# Patient Record
Sex: Female | Born: 1950 | Race: Black or African American | Hispanic: No | Marital: Married | Smoking: Never smoker
Health system: Southern US, Community
[De-identification: ages and names within clinical notes are randomized; demographics above are authoritative.]

## PROBLEM LIST (undated history)

## (undated) DIAGNOSIS — I5042 Chronic combined systolic (congestive) and diastolic (congestive) heart failure: Secondary | ICD-10-CM

## (undated) DIAGNOSIS — R7989 Other specified abnormal findings of blood chemistry: Secondary | ICD-10-CM

## (undated) DIAGNOSIS — I6529 Occlusion and stenosis of unspecified carotid artery: Secondary | ICD-10-CM

## (undated) DIAGNOSIS — K219 Gastro-esophageal reflux disease without esophagitis: Secondary | ICD-10-CM

## (undated) DIAGNOSIS — I255 Ischemic cardiomyopathy: Secondary | ICD-10-CM

## (undated) DIAGNOSIS — I150 Renovascular hypertension: Secondary | ICD-10-CM

## (undated) DIAGNOSIS — I340 Nonrheumatic mitral (valve) insufficiency: Secondary | ICD-10-CM

## (undated) DIAGNOSIS — I472 Ventricular tachycardia: Secondary | ICD-10-CM

## (undated) DIAGNOSIS — N183 Chronic kidney disease, stage 3 unspecified: Secondary | ICD-10-CM

## (undated) DIAGNOSIS — M1990 Unspecified osteoarthritis, unspecified site: Secondary | ICD-10-CM

## (undated) DIAGNOSIS — C50919 Malignant neoplasm of unspecified site of unspecified female breast: Secondary | ICD-10-CM

## (undated) DIAGNOSIS — E785 Hyperlipidemia, unspecified: Secondary | ICD-10-CM

## (undated) DIAGNOSIS — G629 Polyneuropathy, unspecified: Secondary | ICD-10-CM

## (undated) DIAGNOSIS — I2510 Atherosclerotic heart disease of native coronary artery without angina pectoris: Secondary | ICD-10-CM

## (undated) DIAGNOSIS — F319 Bipolar disorder, unspecified: Secondary | ICD-10-CM

## (undated) DIAGNOSIS — D649 Anemia, unspecified: Secondary | ICD-10-CM

## (undated) DIAGNOSIS — E669 Obesity, unspecified: Secondary | ICD-10-CM

## (undated) HISTORY — DX: Chronic combined systolic (congestive) and diastolic (congestive) heart failure: I50.42

## (undated) HISTORY — DX: Renovascular hypertension: I15.0

## (undated) HISTORY — DX: Atherosclerotic heart disease of native coronary artery without angina pectoris: I25.10

## (undated) HISTORY — DX: Malignant neoplasm of unspecified site of unspecified female breast: C50.919

## (undated) HISTORY — DX: Obesity, unspecified: E66.9

## (undated) HISTORY — DX: Bipolar disorder, unspecified: F31.9

## (undated) HISTORY — PX: TONSILLECTOMY: SUR1361

## (undated) HISTORY — DX: Nonrheumatic mitral (valve) insufficiency: I34.0

## (undated) HISTORY — DX: Chronic kidney disease, stage 3 (moderate): N18.3

## (undated) HISTORY — DX: Anemia, unspecified: D64.9

## (undated) HISTORY — DX: Unspecified osteoarthritis, unspecified site: M19.90

## (undated) HISTORY — DX: Polyneuropathy, unspecified: G62.9

## (undated) HISTORY — PX: CORONARY ARTERY BYPASS GRAFT: SHX141

## (undated) HISTORY — DX: Chronic kidney disease, stage 3 unspecified: N18.30

## (undated) HISTORY — DX: Hyperlipidemia, unspecified: E78.5

## (undated) HISTORY — DX: Gastro-esophageal reflux disease without esophagitis: K21.9

## (undated) HISTORY — DX: Ischemic cardiomyopathy: I25.5

---

## 1982-10-29 HISTORY — PX: ABDOMINAL HYSTERECTOMY: SHX81

## 1999-11-28 ENCOUNTER — Encounter: Admission: RE | Admit: 1999-11-28 | Discharge: 2000-02-26 | Payer: Self-pay | Admitting: Orthopedic Surgery

## 2001-07-01 ENCOUNTER — Inpatient Hospital Stay (HOSPITAL_COMMUNITY): Admission: EM | Admit: 2001-07-01 | Discharge: 2001-07-22 | Payer: Self-pay

## 2001-07-02 ENCOUNTER — Encounter: Payer: Self-pay | Admitting: Cardiovascular Disease

## 2001-07-05 ENCOUNTER — Encounter: Payer: Self-pay | Admitting: Surgery

## 2001-07-07 ENCOUNTER — Encounter: Payer: Self-pay | Admitting: Surgery

## 2001-07-08 ENCOUNTER — Encounter: Payer: Self-pay | Admitting: Surgery

## 2001-07-09 ENCOUNTER — Encounter: Payer: Self-pay | Admitting: Surgery

## 2001-07-10 ENCOUNTER — Encounter: Payer: Self-pay | Admitting: Surgery

## 2001-07-11 ENCOUNTER — Encounter: Payer: Self-pay | Admitting: Thoracic Surgery (Cardiothoracic Vascular Surgery)

## 2001-07-13 ENCOUNTER — Encounter: Payer: Self-pay | Admitting: Surgery

## 2001-07-15 ENCOUNTER — Encounter: Payer: Self-pay | Admitting: Surgery

## 2001-07-25 ENCOUNTER — Encounter: Payer: Self-pay | Admitting: Emergency Medicine

## 2001-07-25 ENCOUNTER — Inpatient Hospital Stay (HOSPITAL_COMMUNITY): Admission: EM | Admit: 2001-07-25 | Discharge: 2001-08-07 | Payer: Self-pay | Admitting: Emergency Medicine

## 2001-07-26 ENCOUNTER — Encounter: Payer: Self-pay | Admitting: Thoracic Surgery (Cardiothoracic Vascular Surgery)

## 2001-07-27 ENCOUNTER — Encounter: Payer: Self-pay | Admitting: Thoracic Surgery (Cardiothoracic Vascular Surgery)

## 2001-07-29 ENCOUNTER — Encounter: Payer: Self-pay | Admitting: Thoracic Surgery (Cardiothoracic Vascular Surgery)

## 2001-08-01 ENCOUNTER — Encounter: Payer: Self-pay | Admitting: Internal Medicine

## 2001-08-19 ENCOUNTER — Encounter: Admission: RE | Admit: 2001-08-19 | Discharge: 2001-08-19 | Payer: Self-pay | Admitting: Surgery

## 2001-08-19 ENCOUNTER — Encounter: Payer: Self-pay | Admitting: Surgery

## 2001-10-21 ENCOUNTER — Encounter (HOSPITAL_COMMUNITY): Admission: RE | Admit: 2001-10-21 | Discharge: 2001-11-28 | Payer: Self-pay | Admitting: Rehabilitation

## 2001-10-29 HISTORY — PX: SP PTA RENAL / VISC  ARTERY: HXRAD403

## 2001-12-31 ENCOUNTER — Ambulatory Visit (HOSPITAL_COMMUNITY): Admission: RE | Admit: 2001-12-31 | Discharge: 2002-01-01 | Payer: Self-pay | Admitting: Cardiology

## 2002-06-22 ENCOUNTER — Emergency Department (HOSPITAL_COMMUNITY): Admission: EM | Admit: 2002-06-22 | Discharge: 2002-06-23 | Payer: Self-pay | Admitting: Emergency Medicine

## 2002-08-12 ENCOUNTER — Ambulatory Visit (HOSPITAL_COMMUNITY): Admission: RE | Admit: 2002-08-12 | Discharge: 2002-08-13 | Payer: Self-pay | Admitting: Cardiology

## 2002-08-12 ENCOUNTER — Encounter: Payer: Self-pay | Admitting: Cardiology

## 2002-08-17 ENCOUNTER — Encounter: Admission: RE | Admit: 2002-08-17 | Discharge: 2002-11-15 | Payer: Self-pay | Admitting: Cardiology

## 2002-09-09 ENCOUNTER — Ambulatory Visit (HOSPITAL_COMMUNITY): Admission: RE | Admit: 2002-09-09 | Discharge: 2002-09-10 | Payer: Self-pay | Admitting: Cardiology

## 2003-12-27 ENCOUNTER — Ambulatory Visit (HOSPITAL_COMMUNITY): Admission: RE | Admit: 2003-12-27 | Discharge: 2003-12-27 | Payer: Self-pay | Admitting: Cardiology

## 2005-04-27 ENCOUNTER — Inpatient Hospital Stay (HOSPITAL_COMMUNITY): Admission: EM | Admit: 2005-04-27 | Discharge: 2005-04-30 | Payer: Self-pay | Admitting: Emergency Medicine

## 2006-01-09 ENCOUNTER — Ambulatory Visit: Admission: RE | Admit: 2006-01-09 | Discharge: 2006-01-09 | Payer: Self-pay | Admitting: Family Medicine

## 2006-02-12 ENCOUNTER — Encounter: Payer: Self-pay | Admitting: Vascular Surgery

## 2006-02-12 ENCOUNTER — Ambulatory Visit (HOSPITAL_COMMUNITY): Admission: RE | Admit: 2006-02-12 | Discharge: 2006-02-12 | Payer: Self-pay | Admitting: Cardiology

## 2007-06-23 ENCOUNTER — Ambulatory Visit: Payer: Self-pay | Admitting: Surgery

## 2009-03-07 ENCOUNTER — Encounter: Admission: RE | Admit: 2009-03-07 | Discharge: 2009-03-07 | Payer: Self-pay | Admitting: Cardiology

## 2009-03-16 ENCOUNTER — Ambulatory Visit: Payer: Self-pay | Admitting: Vascular Surgery

## 2009-03-23 ENCOUNTER — Encounter: Admission: RE | Admit: 2009-03-23 | Discharge: 2009-03-23 | Payer: Self-pay | Admitting: Cardiology

## 2009-03-25 ENCOUNTER — Inpatient Hospital Stay (HOSPITAL_BASED_OUTPATIENT_CLINIC_OR_DEPARTMENT_OTHER): Admission: RE | Admit: 2009-03-25 | Discharge: 2009-03-25 | Payer: Self-pay | Admitting: Cardiology

## 2009-03-25 HISTORY — PX: CARDIAC CATHETERIZATION: SHX172

## 2009-04-14 ENCOUNTER — Observation Stay (HOSPITAL_COMMUNITY): Admission: RE | Admit: 2009-04-14 | Discharge: 2009-04-15 | Payer: Self-pay | Admitting: Cardiology

## 2009-05-05 ENCOUNTER — Encounter (HOSPITAL_COMMUNITY): Admission: RE | Admit: 2009-05-05 | Discharge: 2009-06-29 | Payer: Self-pay | Admitting: Cardiology

## 2009-07-01 ENCOUNTER — Ambulatory Visit: Payer: Self-pay | Admitting: Vascular Surgery

## 2009-08-27 ENCOUNTER — Emergency Department (HOSPITAL_COMMUNITY): Admission: EM | Admit: 2009-08-27 | Discharge: 2009-08-27 | Payer: Self-pay | Admitting: Emergency Medicine

## 2010-06-09 ENCOUNTER — Ambulatory Visit: Payer: Self-pay | Admitting: Vascular Surgery

## 2010-11-19 ENCOUNTER — Encounter: Payer: Self-pay | Admitting: Family Medicine

## 2011-01-21 ENCOUNTER — Emergency Department (HOSPITAL_COMMUNITY): Payer: Medicare Other

## 2011-01-21 ENCOUNTER — Emergency Department (HOSPITAL_COMMUNITY)
Admission: EM | Admit: 2011-01-21 | Discharge: 2011-01-21 | Disposition: A | Discharge status: Home / Self Care | Payer: Medicare Other | Attending: Emergency Medicine | Admitting: Emergency Medicine

## 2011-01-21 DIAGNOSIS — I2510 Atherosclerotic heart disease of native coronary artery without angina pectoris: Secondary | ICD-10-CM | POA: Insufficient documentation

## 2011-01-21 DIAGNOSIS — R5381 Other malaise: Secondary | ICD-10-CM | POA: Insufficient documentation

## 2011-01-21 DIAGNOSIS — I10 Essential (primary) hypertension: Secondary | ICD-10-CM | POA: Insufficient documentation

## 2011-01-21 DIAGNOSIS — R059 Cough, unspecified: Secondary | ICD-10-CM | POA: Insufficient documentation

## 2011-01-21 DIAGNOSIS — Z7982 Long term (current) use of aspirin: Secondary | ICD-10-CM | POA: Insufficient documentation

## 2011-01-21 DIAGNOSIS — R0602 Shortness of breath: Secondary | ICD-10-CM | POA: Insufficient documentation

## 2011-01-21 DIAGNOSIS — E7800 Pure hypercholesterolemia, unspecified: Secondary | ICD-10-CM | POA: Insufficient documentation

## 2011-01-21 DIAGNOSIS — R05 Cough: Secondary | ICD-10-CM | POA: Insufficient documentation

## 2011-01-21 DIAGNOSIS — Z79899 Other long term (current) drug therapy: Secondary | ICD-10-CM | POA: Insufficient documentation

## 2011-01-21 DIAGNOSIS — Z951 Presence of aortocoronary bypass graft: Secondary | ICD-10-CM | POA: Insufficient documentation

## 2011-01-21 DIAGNOSIS — E119 Type 2 diabetes mellitus without complications: Secondary | ICD-10-CM | POA: Insufficient documentation

## 2011-01-21 DIAGNOSIS — Z9861 Coronary angioplasty status: Secondary | ICD-10-CM | POA: Insufficient documentation

## 2011-01-21 LAB — URINALYSIS, ROUTINE W REFLEX MICROSCOPIC
Ketones, ur: NEGATIVE mg/dL
Nitrite: NEGATIVE
Specific Gravity, Urine: 1.008 (ref 1.005–1.030)
pH: 6 (ref 5.0–8.0)

## 2011-01-21 LAB — COMPREHENSIVE METABOLIC PANEL
BUN: 13 mg/dL (ref 6–23)
CO2: 29 mEq/L (ref 19–32)
Calcium: 8.7 mg/dL (ref 8.4–10.5)
Creatinine, Ser: 1.01 mg/dL (ref 0.4–1.2)
GFR calc non Af Amer: 56 mL/min — ABNORMAL LOW (ref >60)
Glucose, Bld: 207 mg/dL — ABNORMAL HIGH (ref 70–99)

## 2011-01-21 LAB — DIFFERENTIAL
Basophils Absolute: 0 10*3/uL (ref 0.0–0.1)
Basophils Relative: 1 % (ref 0–1)
Eosinophils Relative: 4 % (ref 0–5)
Monocytes Absolute: 0.3 10*3/uL (ref 0.1–1.0)
Neutro Abs: 1.7 10*3/uL (ref 1.7–7.7)

## 2011-01-21 LAB — CBC
MCHC: 33.8 g/dL (ref 30.0–36.0)
RDW: 13.4 % (ref 11.5–15.5)
WBC: 3.7 10*3/uL — ABNORMAL LOW (ref 4.0–10.5)

## 2011-01-21 LAB — POCT CARDIAC MARKERS: Myoglobin, poc: 103 ng/mL (ref 12–200)

## 2011-02-01 LAB — URINALYSIS, ROUTINE W REFLEX MICROSCOPIC
Hgb urine dipstick: NEGATIVE
Protein, ur: NEGATIVE mg/dL
Urobilinogen, UA: 0.2 mg/dL (ref 0.0–1.0)

## 2011-02-01 LAB — GLUCOSE, CAPILLARY: Glucose-Capillary: 151 mg/dL — ABNORMAL HIGH (ref 70–99)

## 2011-02-01 LAB — DIFFERENTIAL
Basophils Absolute: 0 10*3/uL (ref 0.0–0.1)
Basophils Relative: 0 % (ref 0–1)
Eosinophils Absolute: 0 10*3/uL (ref 0.0–0.7)
Eosinophils Relative: 0 % (ref 0–5)
Lymphocytes Relative: 10 % — ABNORMAL LOW (ref 12–46)

## 2011-02-01 LAB — BASIC METABOLIC PANEL
BUN: 20 mg/dL (ref 6–23)
GFR calc non Af Amer: >60 mL/min (ref >60)
Glucose, Bld: 217 mg/dL — ABNORMAL HIGH (ref 70–99)
Potassium: 4.1 mEq/L (ref 3.5–5.1)

## 2011-02-01 LAB — CBC
HCT: 37.1 % (ref 36.0–46.0)
MCHC: 34.2 g/dL (ref 30.0–36.0)
MCV: 87.7 fL (ref 78.0–100.0)
Platelets: 186 10*3/uL (ref 150–400)
RDW: 14.1 % (ref 11.5–15.5)

## 2011-02-01 LAB — URINE MICROSCOPIC-ADD ON

## 2011-02-05 LAB — GLUCOSE, CAPILLARY
Glucose-Capillary: 104 mg/dL — ABNORMAL HIGH (ref 70–99)
Glucose-Capillary: 107 mg/dL — ABNORMAL HIGH (ref 70–99)

## 2011-02-05 LAB — BASIC METABOLIC PANEL
Chloride: 107 mEq/L (ref 96–112)
GFR calc Af Amer: >60 mL/min (ref >60)
Potassium: 3.9 mEq/L (ref 3.5–5.1)

## 2011-02-05 LAB — CBC
HCT: 33.6 % — ABNORMAL LOW (ref 36.0–46.0)
MCV: 87 fL (ref 78.0–100.0)
RBC: 3.87 MIL/uL (ref 3.87–5.11)
WBC: 4.7 10*3/uL (ref 4.0–10.5)

## 2011-03-13 NOTE — Procedures (Signed)
CAROTID DUPLEX EXAM   INDICATION:  Syncope.   HISTORY:  Diabetes:  Yes  Cardiac:  Arrhythmias, coronary artery bypass graft, history of  congestive heart failure  Hypertension:  Yes  Smoking:  No  Previous Surgery:  No  CV History:  The patient passed out last week when rising from a prone  position.  The syncopal episode was accompanied by left arm numbness.  Amaurosis Fugax No, Paresthesias Yes, Hemiparesis No                                       RIGHT             LEFT  Brachial systolic pressure:         136               138  Brachial Doppler waveforms:         Triphasic         Triphasic  Vertebral direction of flow:        Antegrade         Antegrade  DUPLEX VELOCITIES (cm/sec)  CCA peak systolic                   56                138  ECA peak systolic                   55                182  ICA peak systolic                   73                145  ICA end diastolic                   32                58  PLAQUE MORPHOLOGY:                  Mixed irregular   Mixed  PLAQUE AMOUNT:                      Mild              Moderate  PLAQUE LOCATION:                    CCA, proximal ICA.                  Proximal ICA, ECA   IMPRESSION:  1. 20-39% right internal carotid artery stenosis.  2. 40-59% left internal carotid artery stenosis.  3. Left external carotid artery stenosis.   ___________________________________________  Penny Hora Darrick Penna, Penny James   Penny James  D:  06/23/2007  T:  06/24/2007  Job:  [id number]

## 2011-03-13 NOTE — Procedures (Signed)
CAROTID DUPLEX EXAM   INDICATION:  Follow up carotid artery disease.   HISTORY:  Diabetes:  Yes.  Cardiac:  CABG, CHF, murmur.  Hypertension:  Yes.  Smoking:  No.  Previous Surgery:  No.  CV History:  No.  Amaurosis Fugax No, Paresthesias No, Hemiparesis No.                                       RIGHT               LEFT  Brachial systolic pressure:         136                 142  Brachial Doppler waveforms:         WNL                 WNL  Vertebral direction of flow:        Antegrade           Antegrade  DUPLEX VELOCITIES (cm/sec)  CCA peak systolic                   94                  186  ECA peak systolic                   428                 206  ICA peak systolic                   290                 243  ICA end diastolic                   89                  94  PLAQUE MORPHOLOGY:                  Heterogenous/calcific                  Heterogenous/calcific  PLAQUE AMOUNT:                      Moderate-to-severe  Moderate-to-  severe  PLAQUE LOCATION:                    CCA, ICA, ECA       CCA, ICA, ECA   IMPRESSION:  1. Bilateral internal carotid arteries suggest 60% to 79% stenosis.  2. Bilateral external carotid artery stenosis.  3. Antegrade flow in bilateral vertebrals.   ___________________________________________  Larina Earthly, M.D.   CB/MEDQ  D:  06/09/2010  T:  06/09/2010  Job:  [id number]

## 2011-03-13 NOTE — Assessment & Plan Note (Signed)
OFFICE VISIT   SAMERIA, MORSS  DOB:  [birth year]                                       07/01/2009  [id number]#:[id number]   The patient presents today for continued discussion regarding her severe  asymptomatic right internal carotid artery stenosis.  She had been  considered for a right carotid endarterectomy with severe right internal  carotid artery stenosis.  She subsequently had a coronary event and  underwent PTCA of her left circumflex on 04/14/2009.  I discussed this  with Dr. Mayford Knife and she feels that she is relatively unstable from  cardiac standpoint for carotid surgery.  She is seen today for further  discussion of this and also for consideration regarding stenting of her  carotid.  The patient continues to be asymptomatic.  Her physical  examination is unchanged.  She has 2+ radial pulses.   I reviewed her CT angiogram.  This shows complete circumferential  calcific plaque in her internal carotid artery beginning in her common  carotid artery.  I discussed this with the patient and her husband  present.  I explained that with calcified plaque it puts her at marked  increased risk for stroke with stenting and therefore would not  recommend stenting with this degree of calcified lesion.  Due to her  relatively unstable cardiac situation I feel that her risk for surgery  is in all likelihood greater than her risk for stroke related to her  asymptomatic disease.  I have recommended that we see her in 6 months  for repeat carotid duplex.  If at that time she has remained stable from  a cardiac standpoint we would discuss this with Dr. Mayford Knife to determine  if she may be adequate for general anesthesia and carotid  endarterectomy.  She will notify me should she have symptoms related to  her carotid disease.  I explained that with symptoms she would be at  markedly increased risk and we would recommend surgery at that time.  We  will see her in 6  months.   Larina Earthly, M.D.  Electronically Signed   TFE/MEDQ  D:  07/01/2009  T:  07/02/2009  Job:  [id number]   cc:   Armanda Magic, M.D.  Duncan Dull, M.D.

## 2011-03-13 NOTE — Assessment & Plan Note (Signed)
OFFICE VISIT   Penny, James  DOB:  [birth year]                                       06/09/2010  [id number]#:[id number]   The patient presents today for ongoing followup of asymptomatic carotid  disease.  She is status post prior coronary artery bypass grafting and  reports a stable cardiac status.  She denies any neurologic deficits,  specifically any amaurosis fugax, transient ischemic attack or stroke.  She does have a long history of non-insulin-dependent diabetes,  hypertension, elevated cholesterol and also a history of congestive  heart failure.   SOCIAL HISTORY:  She is married with 3 children.  She is retired.  She  does not smoke or drink alcohol.   FAMILY HISTORY:  Significant for premature atherosclerotic disease in  her brothers and sisters.   REVIEW OF SYSTEMS:  CONSTITUTIONAL:  Weight is 184 pounds.  She is 5  feet 4 inches tall.  She has no weight loss or gain.  She does have pain  in her legs with walking.  CARDIAC:  Positive for arrhythmia.  GI:  Negative.  NEUROLOGIC:  Positive for dizziness but no focal deficits.  PULMONARY:  Negative.  HEMATOLOGIC:  Positive for anemia.  URINARY:  Negative.  ENT:  Negative.  MUSCULOSKELETAL:  Arthritis and joint pain.  PSYCHIATRIC:  Negative.  SKIN:  Negative.   PHYSICAL EXAMINATION:  Well-developed, well-nourished black female  appearing stated age, in no acute distress.  Blood pressure is 150/75,  pulse 72, respirations 18.  HEENT is normal.  She does have bilateral  carotid bruits.  Her chest is clear bilaterally.  Heart is regular rate  and rhythm.  Abdomen:  Moderately obese and nontender.  Musculoskeletal  shows no major deformity or cyanosis.  Neurologic:  No focal stenosis or  paresthesias.  Skin without ulcers or rashes.   She did undergo repeat carotid duplex evaluation in our office today.  This reveals moderate to severe stenoses bilaterally in the 60% to 79%  stenosis range in  the upper range of this scale.  I discussed this with  the patient, explaining that she is just below the threshold of where we  would recommend endarterectomy for asymptomatic disease.  I have  explained symptoms to her and she will notify us if any of these should  occur, otherwise we will see her again in 6 months for repeat carotid  duplex to rule out progression to a severe level of stenosis.     Larina Earthly, M.D.  Electronically Signed   TFE/MEDQ  D:  06/09/2010  T:  06/12/2010  Job:  [id number]

## 2011-03-13 NOTE — Cardiovascular Report (Signed)
NAME:  Penny James, SCHREFFLER NO.:  [id number]   MEDICAL RECORD NO.:  [id number]          PATIENT TYPE:  OIB   LOCATION:  1966                         FACILITY:  [facility]   PHYSICIAN:  Armanda Magic, M.D.     DATE OF BIRTH:  [birth year]   DATE OF PROCEDURE:  03/25/2009  DATE OF DISCHARGE:  03/25/2009                            CARDIAC CATHETERIZATION   REFERRING PHYSICIAN:  Duncan Dull, MD   PROCEDURES:  1. Left heart catheterization.  2. Coronary angiography.  3. Left ventriculography.  4. Saphenous vein graft angiography.  5. Left internal mammary artery angiography.   INDICATIONS:  Coronary artery disease.  Preoperative clearance for  undergoing carotid endarterectomy with abnormal myocardial perfusion  scintigraphy.   OPERATOR:  Armanda Magic, MD   COMPLICATIONS:  None.   INTRAVENOUS MEDICATIONS:  Versed 1 mg and 50 mcg fentanyl.   COMPLICATIONS:  None.   INTRAVENOUS ACCESS:  Via attempted right femoral artery, but could not  gain access, left femoral artery then cannulated with 4-French sheath.   This is a very pleasant 60 year old African American female who has a  history of coronary artery disease and coronary artery bypass grafting  in 2002, with a LIMA to the LAD, saphenous vein graft to the diagonal,  saphenous vein graft to the intermediate and sequential saphenous vein  graft to the OM and PDA of the left circumflex, and a known nondominant  occluded right coronary artery who presents for cardiac catheterization  due to a abnormal myocardial perfusion study, which revealed moderate  ischemia in the apical lateral region and mild-to-moderate ischemia in  the basal inferior, mid inferior, and apical inferior regions.  EF was  mildly reduced to 48%.  She now presents for cardiac catheterization.   The patient was brought to the cardiac catheterization laboratory in a  fasting nonsedated state.  Informed consent was obtained.  The patient  was  connected to continuous heart rate, pulse oximetry monitoring, and  blood pressure monitor.  The right groin was prepped and draped in a  sterile fashion.  Xylocaine 1% was used for local anesthesia.  Using  modified Seldinger technique, a 4-French sheath was attempted being  placed in the right femoral artery.  It could not be advanced the entire  way.  The sheath was removed over guide wire and the dilator was placed  over the guide wire to dilate the area.  The dilator was then removed  and the sheath was then attempted to be placed back over the wire again,  it could only be advanced halfway.  IV contrast was injected.  Sheath  showing an aneurysmal segment with significant calcification.  The  catheter and the wire and sheath were all removed and manual compression  was performed, so adequate hemostasis was obtained.  The left groin was  then prepped and draped.  The groin was anesthetized with 1% lidocaine.  Using modified Seldinger technique, a 4-French sheath was placed in the  left femoral artery.  Under fluoroscopic guidance, a 4-French JL-4  catheter was placed in the left coronary artery.  Multiple cine films  were taken at  30-degree RAO and 40-degree LAO views.  This catheter was  exchanged out over a guidewire for a 4-French 3-D RCA catheter, which  attempted to engaged the right coronary artery, but was unsuccessful.  The catheter was exchanged out over a guidewire for a 4-French JR-4  catheter, which again was not successful in engaging the right coronary  artery.  The catheter was then moved into the vein graft to the  intermediate and multiple cine films were taken at 30-degree RAO 40-  degree LAO views.  This catheter was then advanced into the saphenous  vein graft to the diagonal.  Multiple cine films were taken at 30-degree  RAO and 40-degree LAO views.  This catheter was then advanced into the  vein graft to the saphenous vein graft to the PDA/OM and cine films were   taken in the LAO view showing an occluded vein graft to the ostium.  The  catheter was then pulled into the aortic arch and advanced into the left  subclavian artery.  The catheter was then under fluoroscopic guidance  advanced into the ostium of the left internal mammary artery.  Multiple  cine films were taken in the 40-degree LAO and 30-degree RAO views.  The  catheter was then exchanged out over a guide wire and a 3-D RCA catheter  was again placed up into the area of the right coronary artery, but  could not engage the ostium.  Given the fact that we had used a lot of  contrast over 100 mL and the patient had an known occluded RCA, I did  not pursue this further.  The catheter was exchanged out over guidewire  for a 4-French angled pigtail catheter, which was placed under  fluoroscopic guidance in left ventricular cavity.  Left ventriculography  was performed in the 30-degree RAO view using total of 20 mL of contrast  at 12 mL per second.  The catheter was then pulled back across the  aortic valve with no significant gradient noted.  At the end procedure,  all catheters and sheaths were removed.  Manual compression was  performed, so adequate hemostasis was obtained.  The patient was  transferred back to room in stable condition.   RESULTS:  Left main coronary artery is widely patent and bifurcates to  left anterior descending artery and left circumflex artery.   The left anterior descending artery.  The left circumflex artery has an  80% mid stenosis before the takeoff of an obtuse marginal and then  terminates in a posterior descending artery.   There is evidence of left-to-left collaterals feeding the distal and mid  intermediate, it is occluded at its ostium.   The left anterior descending artery is patent proximally and gives rise  to a small diagonal branch, then there is evidence distally of  competitive flow into a LIMA graft.  There is an 80% mid stenosis in the  LAD and  evidence of competitive flow into a LIMA graft.   The saphenous vein graft to the diagonal is widely patent.  There is  disease in the diagonal branch.  Once it bifurcates into an inferior and  superior branch, the inferior branch has a 70-80% narrowing.  The LIMA  to the LAD is widely patent.  The vessel distal to the insertion of LIMA  to the LAD is diffusely diseased and very small.  The saphenous vein  graft to the intermediate is occluded at the ostium.  Saphenous vein  graft to the OM  and PDA has a 70% ostial lesion and sequential 80% mid  body graft lesions.  The RCA is occluded and is nondominant.   Left ventriculography shows inferior basal akinesis, EF 50-55%, LV  pressure 196/23 mmHg, aortic pressure 194/81 mmHg, and LVEDP 31 mmHg.   ASSESSMENT:  1. Severe 3-vessel coronary artery disease.  2. Occluded saphenous vein graft to the obtuse marginal/posterior      descending artery.  3. Also high-grade stenosis and saphenous vein graft to the      intermediate.  Patent left internal mammary artery to the left      anterior descending, patent saphenous vein graft to the diagonal      with distal disease in the inferior branch of the diagonal.  4. Inferior basal akinesis, Ejection fraction 50-55%.  5. Elevated left ventricular end-diastolic pressure consistent with      diastolic dysfunction.   PLAN:  Discharge to home.  We will review films with Dr. Katrinka Blazing,  questionable PCI in the native left circumflex.  Plus or minus saphenous  vein graft to the intermediate.      Armanda Magic, M.D.  Electronically Signed     TT/MEDQ  D:  03/25/2009  T:  03/25/2009  Job:  [id number]

## 2011-03-13 NOTE — H&P (Signed)
HISTORY AND PHYSICAL EXAMINATION   Mar 16, 2009   Re:  Penny James, Penny James                 DOB:  [birth year]   PRIMARY DIAGNOSIS FOR ADMISSION:  Severe asymptomatic right internal  carotid artery stenosis.   HISTORY OF PRESENT ILLNESS:  The patient is a 60 year old black female  who was found to have carotid stenosis by duplex after evaluating a  right carotid bruit.  She specifically denies any prior neurologic  deficits with no stroke, amaurosis fugax or transient ischemic attacks.  She had had serial followup and recently had repeat CT scan for  continued evaluation.  I have this for review.  This revealed greater  than 80 and possibly greater than 90% right internal carotid artery  stenosis and 60-70% stenosis in the left internal carotid artery.  The  patient is right-handed.  She is seen for further evaluation.  She does  have a prior strong history of coronary artery disease undergoing prior  coronary artery bypass grafting plus has a history of hypertensive heart  disease without heart failure, elevated cholesterol.  She had had a  cardiac catheterization in 2005 showing widely patent grafts from her  prior bypass.  Her bypass was in 2002 by Dr. Laneta Simmers.  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   SOCIAL HISTORY:  She does not smoke or drink alcohol.  She is married.   MEDICATION ALLERGIES:  Penicillin and protamine.   FAMILY HISTORY:  Negative for premature atherosclerotic disease.   CURRENT MEDICATIONS:  1. Risperdal 1 mg tab b.i.d.  2. Crestor 40 mg daily.  3. Niaspan 500 mg daily.  4. Aspirin 81 mg daily.  5. Multivitamin one tablet daily.  6. Ferrous sulfate one tablet daily.  7. Zetia 10 mg once daily.  8. Nasonex 50 mcg puff daily.  9. Prevacid 15 mg over-the-counter daily.  10.Coreg 25 mg daily.  11.Lasix 40 mg daily.  12.Avapro 150 mg daily.  13.Imdur 30  mg by mouth daily.  14.Plavix 75 mg daily.  15.Glucotrol XL 10 mg daily.  16.Norvasc 5 mg daily.   PHYSICAL EXAMINATION:  A well-developed, well-nourished black female  appearing her stated age of 52.  She is grossly intact neurologically.  She does have soft right carotid bruit.  I did not appreciate any left  carotid bruit.  Her radial pulses are 2+ and she has 2+ dorsalis pedis  pulses bilaterally.  Chest is clear bilaterally.  Heart is regular rate  and rhythm.   I reviewed her CT angiogram with the patient.  This does show severe  right internal carotid artery stenosis with a patent carotid above this.  She does have unusual configuration of her carotid arteries with medial  retropharyngeal position of these.  I discussed the significance of her  high-grade stenosis and the fact that this puts her at increased risk  for stroke.  I have recommended endarterectomy for reduction of stroke  risk.  I explained a 1-2% risk of stroke with surgery and also of the  potential complications to include cranial nerve injury, bleeding and  infection also at a very low rate.  She does have moderate to severe  vertebral stenoses bilaterally and this is asymptomatic and therefore we  would not recommend intervention at this time.  She understands and  wishes to proceed with surgery  on 03/29/2009 at [facility].   Larina Earthly, M.D.  Electronically Signed   TFE/MEDQ  D:  03/16/2009  T:  03/17/2009  Job:  [id number]   cc:   Armanda Magic, M.D.  Duncan Dull, M.D.

## 2011-03-13 NOTE — Discharge Summary (Signed)
NAME:  STORMY, CONNON NO.:  [id number]   MEDICAL RECORD NO.:  [id number]          PATIENT TYPE:  INP   LOCATION:  2502                         FACILITY:  [facility]   PHYSICIAN:  Francisca December, M.D.  DATE OF BIRTH:  1951/08/21   DATE OF ADMISSION:  04/14/2009  DATE OF DISCHARGE:  04/15/2009                               DISCHARGE SUMMARY   DISCHARGE DIAGNOSES:  1. Coronary artery disease, status post percutaneous coronary      intervention to the mid circumflex, residual saphenous vein graft      disease.  2. Known coronary artery disease, history of bypass graft surgery in      2005.  3. Hypertension, renovascular, status post percutaneous transluminal      angioplasty of the left renal artery.  4. Dyslipidemia.  5. Bilateral carotid bruits, will need to have a carotid      endarterectomy in the near future.  6. Diabetes mellitus with retinopathy/neuropathy.  7. Ischemic cardiomyopathy with inferior hypokinesis with ejection      fraction being normal after 2005.  8. Obesity.  9. Hypotension.  10.Bipolar.  11.Congestive heart failure history.  12.Osteoarthritis.  13.Gastroesophageal reflux disease.   SURGICAL HISTORY:  The grafts are:  LIMA to the LAD, saphenous vein  graft to the OM1/PDA, saphenous vein graft to the RI, saphenous vein  graft to the diagonal.   HOSPITAL COURSE:  Richelle Glick is a 60 year old female who  underwent cardiac catheterization on Mar 17, 2009, for abnormal nuclear  test.  She was found to have an occluded saphenous vein graft to the  OM/PDA as well as high-grade stenosis of the saphenous vein graft to the  intermediate.  She was found to have a high-grade circumflex lesion as  well.   She was brought into the hospital on April 14, 2009, and had a  percutaneous intervention using a Xience stent to the mid circumflex  lesion with good results.  The patient remained in the hospital  overnight, and lab studies show a BUN of  17, creatinine 0.97, hemoglobin  11.4, and hematocrit 33.6.  She has an additional blockage of the  saphenous vein graft to the ramus, and there was reversibility of the  apical lateral defect in the territory of the ramus.  She will need to  return for a second procedure prior to her carotid endarterectomy.   We have scheduled her to see Dr. Armanda Magic in followup on May 03, 2009, at 11 a.m.  In the following week, she will need to have  intervention by Dr. Amil Amen, and I will go ahead and set this up and let  the patient know.   In the meantime, she is to remain on a low-sodium, heart-healthy diet.  Increase activity slowly as per cardiac rehabilitation.  No lifting for  1 week.  No driving for 2 days.  Clean cath site gently with soap and  water.  No scrubbing.   DISCHARGE MEDICATIONS:  1. Enteric-coated aspirin 325 mg a day.  2. Multivitamins and iron daily.  3. Nasonex as before.  4. Plavix 75 mg a day.  5. Lasix 40 mg a day.  6. Glucotrol XL 10 mg a day.  7. Norvasc 5 mg a day.  8. Imdur 30 mg a day.  9. Crestor 40 mg a day.  10.Zetia 10 mg a day.  11.Prevacid 15 mg a day.  12.Avapro 150 mg a day.  13.Niacin 500 mg at bedtime.  14.Coreg 25 mg p.o. b.i.d.      Guy Franco, P.A.      Francisca December, M.D.  Electronically Signed    LB/MEDQ  D:  04/15/2009  T:  04/15/2009  Job:  [id number]   cc:   Armanda Magic, M.D.  Duncan Dull, M.D.

## 2011-03-13 NOTE — Procedures (Signed)
CAROTID DUPLEX EXAM   INDICATION:  Followup of known carotid artery disease.   HISTORY:  Diabetes:  Yes.  Cardiac:  Arrhythmia, coronary artery bypass graft, history of  congestive heart failure.  Hypertension:  Yes.  Smoking:  No.  Previous Surgery:  No.  CV History:  No.  Amaurosis Fugax No, Paresthesias No, Hemiparesis No.                                       RIGHT             LEFT  Brachial systolic pressure:         160               160  Brachial Doppler waveforms:         Biphasic          Biphasic  Vertebral direction of flow:        Antegrade         Antegrade  DUPLEX VELOCITIES (cm/sec)  CCA peak systolic                   74                153  ECA peak systolic                   394               238  ICA peak systolic                   238               231  ICA end diastolic                   101               92  PLAQUE MORPHOLOGY:                  Calcified         Calcified  PLAQUE AMOUNT:                      Moderate-to-severe                  Moderate-to-severe  PLAQUE LOCATION:                    CCA, ICA, ECA     CCA, ICA, ECA   IMPRESSION:  Bilateral high-end 60-79% internal carotid artery stenosis.   ___________________________________________  Larina Earthly, M.D.   AC/MEDQ  D:  03/16/2009  T:  03/16/2009  Job:  [id number]

## 2011-03-16 NOTE — Discharge Summary (Signed)
[facility]  Patient:    [name], [name] Visit Number: [id number] MRN: [id number]          Service Type: MED Location: 2000 2005 01 Attending Physician:  Cleatrice Burke Dictated by:   Adair Patter, P.A. Admit Date:  07/01/2001 Disc. Date: 07/18/01   CC:         Alleen Borne, M.D.  Madaline Savage, M.D.   Discharge Summary  ADDENDUM  Penny James was originally scheduled for discharge on July 14, 2001. Her discharge was subsequently held secondary to fluid overload and patient having trouble with dyspnea and ambulation.  Because of her fluid overload, dyspnea, and difficulty with ambulation her discharge was held and she was aggressively diuresed with diuretics.  She was seen by cardiology while still in hospital and several of her medications were changed.  She diuresed well. Her ambulation improved and she was subsequently discharged home in stable condition on July 18, 2001.  NEW MEDICATIONS: 1. Keflex 500 mg one tablet q.8h. for eight days. 2. Digoxin 0.125 mg one tablet q.d. 3. Coreg 6.25 mg one tablet b.i.d. 4. Lasix 80 mg q.d.  Patient was originally scheduled to take Toprol XL 25 mg q.d.  This medication was discontinued. Dictated by:   Adair Patter, P.A. Attending Physician:  Cleatrice Burke DD:  07/17/01 TD:  07/17/01 Job: [id number]

## 2011-03-16 NOTE — Op Note (Signed)
[facility]  Patient:    Penny James, Penny James Visit Number: [id number] MRN: [id number]          Service Type: MED Location: 2300 2308 [id number] Attending Physician:  Cleatrice Burke Dictated by:   Alleen Borne, M.D. Proc. Date: 07/07/01 Admit Date:  07/01/2001   CC:         [facility] Card[facility] Office, Dr. Elsie Lincoln  Cath Lab, [facility]   Operative Report  PREOPERATIVE DIAGNOSIS:  Severe 3-vessel coronary artery disease with unstable angina and acute pulmonary edema.  POSTOPERATIVE DIAGNOSIS:  Severe 3-vessel coronary artery disease with unstable angina and acute pulmonary edema.  OPERATIVE PROCEDURE:  Median sternotomy, extracorporeal circulation, coronary artery bypass graft surgery x 5 using a left internal mammary artery graft to left anterior descending coronary artery, with a saphenous vein graft to the diagonal branch of the left anterior descending, a saphenous vein graft to the intermediate coronary artery, and a sequential saphenous vein graft to the obtuse marginal and posterior descending branch of the left circumflex coronary artery.  ATTENDING SURGEON:  Alleen Borne, M.D.  ASSISTANT:  Dominica Severin, P.A.-C.  ANESTHESIA:  General endotracheal.  CLINICAL HISTORY:  This patient is a 60 year old diabetic woman with a history of hypertension, hyperlipidemia, and a family history of premature coronary disease, who was admitted on July 02, 2001 with acute pulmonary edema and chest pain.  She ruled out for acute myocardial infarction.  Her pulmonary edema improved with diuretic therapy.  She underwent cardiac catheterization by Dr. Delrae Rend and this showed severe three-vessel disease.  The LAD had a 90% hazy ostial lesion.  There was about 70-80% distal stenosis.  There was a diagonal branch that had a high-grade ostial lesion.  The LAD appeared diffusely diseased.  There was an intermediate vessel with a 99%  ostial stenosis.  The left circumflex was the dominant vessel and it had 90% mid vessel stenosis before the takeoff of two posterolateral branches and a posterior descending branch.  There was about an 80% stenosis in the posterior descending branch.  The right coronary artery was nondominant and occluded and was not seen filling by collaterals.  The left ventricular ejection fraction was 35% with global hypokinesis.  There was no significant mitral regurgitation.  After review of the angiograms and examination of the patient, it was felt that coronary artery bypass graft surgery was the best treatment. I discussed the operative procedure with the patient and her family, including alternatives, benefits, and risks, including bleeding, possible blood transfusion, infection, stroke, myocardial infarction, and death.  They understood and agreed to proceed.  OPERATIVE PROCEDURE:  The patient was taken to the operating room and placed on table in the supine position.  After induction of general endotracheal anesthesia, a Foley catheter was placed in bladder using sterile technique. Then, the chest, abdomen and both lower extremities were prepped and draped in usual sterile manner.  The chest was entered through a median sternotomy incision.  The pericardium opened in the midline.  Examination of the heart showed good ventricular contractility.  The ascending aorta had no palpable plaques in it.  Then, the left internal mammary artery was harvested from the chest wall as a pedicle graft.  This was a medium caliber vessel with excellent blood flow through it.  At the same time, a segment of greater saphenous vein was harvested from the right leg and this vein was of medium size and good quality.  Then, the patient was heparinized  and when an adequate activated clotting time was achieved, the distal ascending aorta was cannulated using a 20-French aortic cannula for arterial inflow.  Venous  outflow was achieved using a 2-stage venous cannula through the right atrial appendage.  An antegrade cardioplegia and vent cannula was inserted in the aortic root.  The patient was placed on cardiopulmonary bypass and distal coronaries identified.  She had severe diffuse coronary plaque.  Every artery that was visualized on her heart had plaque extending from the proximal portion the whole way to the distal portions of the vessels.  Much of this plaque was calcified.  Then the aorta was cross-clamped and 500 cc of cold-blood antegrade cardioplegia was administered in the aortic root with quick arrest of the heart.  Systemic hypothermia to 20 degrees Centigrade and topical hypothermia with iced saline was used.  A temperature probe was placed in the septum and an insulating pad in the pericardium.  The first distal anastomosis was performed to the obtuse marginal branch.  The internal diameter was about 1.75 mm.  The conduit used was a segment of greater saphenous vein and the anastomosis performed in a sequential side-to-side manner using continuous 7-0 Prolene suture.  Flow was measured through the graft and was excellent.  The second distal anastomosis was performed to the posterior descending branch of the right coronary artery.  The internal diameter of this vessel was 1.5 mm.  The conduit used was the same segment of greater saphenous vein and  the anastomosis performed in a sequential end-to-side manner using continuous 7-0 Prolene suture.  Flow was measured through the graft and was excellent.  Then a dose of cardioplegia was given down the vein graft and in the aortic root.  The third distal anastomosis was then performed to the intermediate coronary artery.  The internal diameter was 1.5 mm.  The conduit used was a second segment of greater saphenous vein and the anastomosis performed in an end-to-side manner using continuous 7-0 Prolene suture.  Flow was measured through  the graft and was excellent.   The fourth distal anastomosis was performed to the diagonal branch.  The internal diameter of was 1.5 mm.  The conduit used was a third segment of greater saphenous vein and the anastomosis performed in an end-to-side manner using continuous 7-0 Prolene suture.  Flow was measured through the graft and was excellent.  Then another dose of cardioplegia was given down the vein grafts and in the aortic root.  The fifth distal anastomosis was performed to the mid portion of the left anterior descending coronary artery.  The internal diameter was 1.75 mm.  The conduit used was the left internal mammary artery and this was brought through an opening in the left pericardium anterior to the phrenic nerve.  It was anastomosed to the LAD in an end-to-side manner using continuous 8-0 Prolene suture.  The pedicle was tacked to epicardium with 6-0 Prolene sutures.  The patient was rewarmed to 37 degrees Centigrade and the clamp removed from the mammary pedicle.  There was rapid warming of ventricular septum and return of spontaneous ventricular fibrillation.  The cross-clamp was removed with a time of 89 minutes and the patient defibrillated in a sinus rhythm.  A partial occlusion clamp was placed on the aortic root and the three proximal vein graft anastomoses were performed in an end-to-side manner using continuous 6-0 Prolene suture.  The clamp was removed and the vein grafts deaired and the clamps removed from them.  The proximal  and distal anastomoses appeared hemostatic and the lying of the grafts satisfactory.  Graft markers were placed around the proximal anastomoses.  Two temporary right ventricular and right atrial pacing wires were placed and brought out through the skin.  When the patient had rewarmed to 37 degrees Centigrade, she was weaned from cardiopulmonary bypass on low-dose dopamine.  Total bypass time was 140 minutes.  Cardiac function appeared  excellent with a cardiac output of 5 L/min.  Then a test dose of protamine was given.  As this test dose of 1 mg was given, the heart immediately began to dilate and become globally hypocontractile with a drop in heart rate and blood pressure.  This continued to worsen and it was necessary to re-heparinize and place the venous cannula back in the right atrium and we had to go back on bypass.  After back on bypass for a couple of minutes, the heart began to beat vigorously again. After an additional 10 minute rest period, we again weaned from cardiopulmonary bypass.  At this point, I was not sure whether this event was due to protamine or due to some air in one of the coronaries or a graft problem.  The second time we weaned from cardiopulmonary bypass, the heart function was excellent.  Vital signs were very stable.  Contraction seemed very vigorous.  We gave 100 mg hydrocortisone and 50 mg of Benadryl.  Then, a 1 mg test dose of protamine was again given.  As this was started, the heart began to do the same thing as before with marked decrease in contractility and a rise in pulmonary artery pressure and CVP.  The patient became bradycardic and blood pressure dropped precipitously.  This required [place] to go back on bypass a second time.  Within a few minutes, cardiac contractility was normal again.  I examined the grafts and they were all lying normally and appeared to have good blood flow through them.  There was good contractility of all myocardial segments.  At this point, I was fairly convinced that this was a protamine reaction and did not feel comfortable with giving the patient another dose of protamine.  Therefore, a decision was made to allow the heparin to rear off on its own knowing that we may have to return to the operating room for bleeding.  The aortic and venous cannulae were removed without difficulty.  The patient was given 8 units of platelets and two units of fresh frozen  plasma to correct any underlying coagulopathy.  Hemostasis was achieved as best as possible.  There was still diffuse oozing from all the raw surfaces.  Three chest tubes were placed with a tube in the posterior pericardium, one in the left pleural space and one in the anterior mediastinum.  The pericardium was loosely reapproximated over the heart.  The sternum was closed with #6 stainless steel wires.  Fascia was closed with continuous #1 Vicryl suture.  Subcutaneous tissue was closed with a continuous 2-0 Vicryl and the skin with 3-0 Vicryl subcuticular closure.  The lower extremity vein harvest site was closed layers in a similar manner.  The sponge, needle and instrument counts were correct according to scrub nurse. Dry sterile dressings were applied over the incisions, around the chest tubes, which were hooked to Pleur-evac suction.  The patient remained hemodynamically stable and was transported to the SICU in guarded, but stable condition. Dictated by:   Alleen Borne, M.D. Attending Physician:  Cleatrice Burke DD:  07/07/01 TD:  07/07/01 Job: [id number]

## 2011-03-16 NOTE — Cardiovascular Report (Signed)
NAME:  Penny James, Penny James                     ACCOUNT NO.:  [id number]   MEDICAL RECORD NO.:  [id number]                   PATIENT TYPE:  OIB   LOCATION:  6533                                 FACILITY:  [facility]   PHYSICIAN:  Cristy Hilts. Jacinto Halim, M.D.                  DATE OF BIRTH:  1951-05-15   DATE OF PROCEDURE:  08/12/2002  DATE OF DISCHARGE:  08/13/2002                              CARDIAC CATHETERIZATION   PROCEDURES PERFORMED:  1. Left heart catheterization.  2. Left ventriculography.  3. Selective right and left coronary arteriography.  4. Saphenous vein graft and left internal mammary artery angiography.  5. Abdominal aortogram with bilateral selective renal arteriography.   INDICATION:  The patient is a 60 year old female with history of  hypertension, diabetes, hypercholesterolemia, and history of coronary artery  disease, status post coronary artery bypass grafting, presents with  increasing shortness of breath.  Also, her blood pressure has been  uncontrolled.  She has had renal angioplasty in the past.  Suspicion for re-  stenosis was present.  Hence she underwent Cardiolite stress test which  revealed lateral wall ischemia.  Hence, she was brought to the cardiac  catheterization laboratory to evaluate her coronary anatomy.   HEMODYNAMIC DATA:  The left ventricular pressures 159/70 with an end-  diastolic pressure of 19 mmHg.  The aortic pressure 159/74 with a mean of  108 mmHg.  There was no pressure gradient across the aortic valve.   ANGIOGRAPHIC DATA:   LEFT VENTRICULOGRAM:  The left ventricular systolic function was mildly  depressed at around 40-45% with mild inferior inferolateral wall  hypokinesis.  This ejection fraction is improved from 30-35% almost a year  ago.   Right coronary artery:  The right coronary artery is nondominant and is  occluded in the proximal segment.   Left main coronary artery:  The left main coronary artery is a large caliber  vessel.   It has mild calcification.   Left circumflex:  Left circumflex is very large caliber vessel and a  dominant vessel.  It gives origin to a large obtuse marginal #1, a moderate  sized obtuse marginal #2 and a large PDA.  Just before the origin of obtuse  marginal #1 there is a 70-80% stenosis.  There is also a 90% stenosis just  after the origin of the obtuse marginal #1.  The distal PDA itself has mild  diffuse disease.  The obtuse marginal #1 and the distal PDA are supplied by  SVG.   Ramus intermedius:  The ramus intermedius is occluded 100% in the proximal  segment.  The distal bed is supplied by SVG to ramus intermedius. The ramus  itself has mild to moderate diffuse disease.   Left anterior descending:  The left anterior descending is a very large  caliber vessel, wraps around the apex. It has an 80% ostial stenosis.  The  mid to distal segment has about 50-60%  stenosis.  The mid left anterior  descending is supplied by LIMA to LAD.   The LAD gives origin to a moderate sized diagonal #1, which is sincerely  diffusely diseased and the rest of the lumen is about 1.0 to 1.5 mm.  This  is supplied by SVG to diagonal #1.  However, distal to the SVG insertion,  there is about 99% stenosis.   SVG to ramus intermedius:  The SVG to ramus is widely patent.   SVG to diagonal #1:  SVG to diagonal #1 is widely patent.  However, as noted  above, distal to this insertion site of the graft, there is about 99%  stenosis.  However, the diagonal #1 itself is severely diffusely diseased,  1.0 to 1.5 mm vessel.   SVG to obtuse marginal #1 and PDA sequential graft:  SVT to OM-1 and PDA has  very smooth lumen. However, the mid segment has about 80-90% luminal  irregularity noted.  The obtuse marginal #1 is not well-visualized through  the graft.  The PDA lights up angiographically.   LIMA to LAD:  LIMA to LAD is widely patent.  The subclavian artery is also  widely patent.   ABDOMINAL AORTOGRAM:   Abdominal aortogram revealed mild atherosclerotic  changes.   Selective right and left renal arteriography:  Selective right and left  renal arteriography revealed in-stent re-stenosis by about 80% in the left  renal artery.  The right renal stent is widely patent.   IMPRESSION:  1. Patent grafts, including saphenous vein graft to ramus, saphenous vein     graft to diagonal (post insertion site has 99% stenosis) and saphenous     vein graft to obtuse marginal and posterior descending artery; however,     has 80-90% mid stenosis and patent left internal mammary artery to left     anterior descending. Severe native coronary artery disease.  2. Ejection fraction improved to 45% from prior 30-35% a year ago.  3. 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   RECOMMENDATIONS:  We will proceed with PCI of the SVG to OM-1 and PDA.   INTERVENTIONAL DATA:  Successful filter wire placement and primary stenting  of the SVG to obtuse marginal #1 and PDA graft with a 4.5 x 12 mm Express  stent deployed at 12 atmospheres maximum.  The stenosis was reduced from 80-  90% to 0%.  Post stent deployment, the obtuse marginal is now very well-  visualized along with PDA.   RECOMMENDATIONS:  Consider aggressive risk factor modification as indicated.  Will add Norvasc for hypertension control. Patient may benefit from re-do of  left renal artery in-stent re-stenosis as her blood pressure had been well-  controlled following the angioplasty. This will be performed on an elective  basis.  The TIMI flow was from TIMI-3 to TIMI-3 after successful PTCA and  stenting of SVG to obtuse marginal #1 and PDA.   TECHNIQUE OF PROCEDURE:  Under usual sterile precautions, using 6 French  right femoral artery access and a 6 [place] multipurpose B2 catheter was  advanced into the ascending aorta over a 0.035 inch J wire.  The catheter was gently advanced to the left ventricle and left ventricular pressure were  monitored.   Hand contrast injection of the left ventricle was performed,  both in LAO and RAO projection. Then the catheter was flushed with saline,  pulled back into the ascending aorta and pressure gradient across gradient  across the aortic valve was monitored. The right coronary artery  was  selectively engaged and angiography was performed.  Then the catheter was  manipulated to engage the SVG to obtuse marginal and PDA and SVT to ramus  intermediate.  Then the catheter was pulled back into the abdominal aorta  and abdominal aortogram  right and left selective renal arteriography was  performed. Then the catheter was pulled out of the body.  Then a 6 [place]  Judkins left diagnostic catheter was advanced through the ascending aorta  and the left main coronary artery was selectively engaged.  Angiography was  performed. Then the catheter was pulled out of the body.  Then a 6 [place]  diagnostic Judkins right catheter was advanced in the ascending aorta and  the SVG to the ramus intermedius was cannulated and angiography was  performed.  Then the catheter was disengaged and pulled back into the  arch  of the aorta and the left subclavian artery was selectively cannulated and  angiography was performed.  Then the catheter was pulled out of the body in  the usual fashion.   TECHNIQUE OF INTERVENTION:  The 6 French sheath was exchanged for a 7 [place]  sheath.  Then a 7 [place] hockey-stick with side-hole was advanced into the  ascending aorta over a 0.035 inch J wire.  The SVG to circumflex coronary  artery was selectively cannulated. Then a Scimed filter wire was advanced  into the graft and the filter was carefully positioned distal to the  stenosis.  Then a 4.5 x 12 mm Express stent was advanced over this guide  wire after removing the introducer sheath and after positioning confirming  the position of the stent, the stent was deployed at 12 atmospheres.  The  pressure was reduced to 10 atmospheres of  pressure for 45 seconds, balloon  was deflated.  A second inflation was performed again at 10 atmospheres of  pressure for 30 seconds and the balloon was deflated, pulled back in the  guiding catheter and intracoronary nitroglycerin was administered and  angiography was performed.  Then the filter wire was revamped into the  sheath and pulled out of the body. Excellent results were noted.  There was  no evidence of dissection, no evidence of thrombosis at the end of the  procedure. A TIMI flow was from TIMI-3 to TIMI-3 flow.  Then the guide was  disengaged from the graft and pulled out of the body in the usual fashion.  During the procedure, Angiomax was utilized for anticoagulation and the ACT  was maintained at therapeutic range.  The patient tolerated the procedure  well.                                               Cristy Hilts. Jacinto Halim, M.D.    Pilar Plate  D:  08/13/2002  T:  08/14/2002  Job:  [id number]

## 2011-03-16 NOTE — Cardiovascular Report (Signed)
[place]. [facility]  Patient:    Penny James, Penny James Visit Number: [id number] MRN: [id number]          Service Type: MED Location: [facility] [id number] Attending Physician:  Berry, Jonathan [place] Dictated by:   Delrae Rend, M.D. Proc. Date: 07/03/01 Admit Date:  07/01/2001   CC:         Madaline Savage, M.D.  [facility]   Cardiac Catheterization  ATTENDING CARDIOLOGIST:  Madaline Savage, M.D.  PROCEDURES PERFORMED: 1. Selective left ventriculography. 2. Selective left coronary arteriography. 3. Nonselective right coronary arteriography. 4. Ascending aortogram. 5. Visualization of left subclavian artery and left internal mammary    artery. 6. Abdominal aortogram with selective right and left renal arteriogram.  INDICATIONS:  Ms. Arrazola is a 60 year old female with history of hypertension and diabetes mellitus, who was admitted to the hospital with flash pulmonary edema.  She was brought to the cardiac catheterization laboratory to evaluate her coronary anatomy.  HEMODYNAMIC DATA:  Left ventricular pressures were 132/10, with end-diastolic pressure of 28 mmHg.  The central aortic pressures were 129/75 with a mean of 94 mmHg.  There was no pressure gradient across the aortic valve.  ANGIOGRAPHIC DATA: Left ventricle:  The left ventricular systolic function is globally reduced and is estimated at 30-35%.  There is global hypokinesis.  There is no significant mitral regurgitation noted.  Left main coronary artery:  The left main coronary artery has distal 20-30% stenosis.  It bifurcates into left anterior descending, intermediate, and circumflex coronary artery.  Left anterior descending artery:  The left anterior descending is a large caliber vessel.  Proximally, it has ostial 90% stenosis with haziness.  It gives origin to a moderate sized diagonal #1 and smaller diagonals #2 and #3. The diagonals are disease  free.  In the mid to distal segment of the left anterior descending there is about 70-80% stenosis.  It has mild diffuse disease and wraps around the apex.  Intermediate coronary artery:  The intermediate coronary artery is a moderate caliber vessel.  It has ostial 99% stenosis and has moderate diffuse disease in the proximal segment.  Circumflex artery:  The circumflex artery is a large caliber vessel.  It is a dominant vessel.  It has a mid 90% stenosis and a mid to distal trifurcation. There is 80% stenosis at the trifurcation of obtuse marginal #1, obtuse marginal #2, and obtuse marginal #3.  It gives origin to several small posterior descending arteries.  The circumflex system has left-to-left collaterals from the left anterior descending artery.  Right coronary artery:  The right coronary artery is not visualized directly. It is flush occluded at the aorta.  The RCA is nondominant and is filled by collaterals from the left-to-right on ascending root injection.  Ascending aortic root angiogram:  This revealed evidence of three aortic valve cusps.  There is no evidence of aortic regurgitation.  There is no evidence of aortic dissection.  The right coronary artery is visualized as a part of late filling probably from collaterals from left-to-right (type A collaterals).  Left subclavian arteriogram/left internal mammary artery:  Visualization revealed patent LIMA.  There was no subclavian stenosis.  Abdominal aortogram:  There was the presence of two renal arteries, left and right.  There was tortuous aorta.  Renal arteriogram:  The left renal artery has diffuse plaque in the proximal segment.  It has about 80% stenosis.  The right renal artery has about 20% stenosis.  IMPRESSIONS: 1. Marked global hypokinesis with ejection fraction of 30-35% with elevated    end-diastolic pressure of 28 mmHg. 2. Ostial left anterior descending artery and mid to distal left anterior     descending artery stenosis by 90% and 70-80% respectively. 3. 99% ostial intermediate coronary artery lesion with moderate diffuse    disease in the proximal segment. 4. Mid 90% and mid to distal trifurcation stenosis of the circumflex    coronary artery (dominant artery).  There were left-to-left collaterals    seen from the left anterior descending artery to the circumflex coronary    artery. 5. Renal arteriography revealed diffuse atherosclerotic change of the left    renal artery involving from the ostium all the way to the bifurcation with    80% stenosis.  The right has 20% stenosis. 6. Patent left internal mammary artery.  RECOMMENDATIONS:  We will proceed with coronary artery bypass grafting surgery.  CVTS has been notified.  TECHNIQUE:  Under the usual sterile precautions, using 6-French right femoral artery access, a 6-French Judkins left diagnostic catheter was advanced into the ascending aorta over a 0.035 mm J-wire.  The catheter was gently advanced into the left main coronary artery and the left main coronary artery was selective engaged and arteriography was performed.  After obtaining the adequate visualization, the catheter was pulled out of the body in the usual fashion.  Then a 6-French Judkins diagnostic right catheter was advanced to the descending aorta over a 0.035 mm J-wire.  Right coronary artery cannulation was attempted.  Then the catheter was gently advanced into the left ventricle and left ventricular pressures were monitored.  Hand contrast injection of the left ventricle was performed in the RAO projection.  The catheter was flushed with saline and pulled back into the ascending aorta and pressure gradient across the aortic valve was monitored.  Then the catheter was pulled out of the body in the usual fashion.  Then an Amplatz-I 6-French diagnostic catheter was advanced into the ascending aorta over a 0.035 mm  J-wire.  The right coronary artery  cannulation was again attempted and because we were unable to cannulate the right coronary artery, the catheter was then pulled out of the body over a 0.035 mm J-wire and a 6-French multipurpose B2 catheter was advanced into ascending aorta over the 0.035 mm J-wire.  Again, the right coronary artery cannulation was attempted.  Then the catheter was pulled back into the arch of the aorta and left subclavian arteriography was performed in the LAO projection.  The catheter was pulled back into the abdominal aorta and abdominal aortogram was performed.  Then selective right and left renal arteriography was then performed.  Then the catheter was pulled out of the body of the usual fashion and manual pressure was held and adequate hemostasis obtained.  The patient was transferred to the floor in stable condition. Dictated by:   Delrae Rend, M.D. Attending Physician:  Berry, Jonathan [place] DD:  07/03/01 TD:  07/03/01 Job: [id number]

## 2011-03-16 NOTE — Discharge Summary (Signed)
[place]. [facility]  Patient:    Penny James, Penny James Visit Number: [id number] MRN: [id number]          Service Type: DSU Location: [phone] Attending Physician:  Pamella Pert Dictated by:   [facility] Creola, P.A.C. Admit Date:  12/31/2001                             Discharge Summary  There was no dictation on this report. Dictated by:   [facility] Corbin, P.A.C. Attending Physician:  Pamella Pert DD:  12/31/01 TD:  01/01/02 Job: [id number]

## 2011-03-16 NOTE — Discharge Summary (Signed)
NAME:  Penny James, Penny James                     ACCOUNT NO.:  [id number]   MEDICAL RECORD NO.:  [id number]                   PATIENT TYPE:  OIB   LOCATION:  6533                                 FACILITY:  [facility]   PHYSICIAN:  Cristy Hilts. Jacinto Halim, M.D.                  DATE OF BIRTH:  [birth year]   DATE OF ADMISSION:  08/12/2002  DATE OF DISCHARGE:  08/13/2002                                 DISCHARGE SUMMARY   DISCHARGE DIAGNOSES:  1. Coronary disease, coronary artery bypass grafting in 9/02.  2. Unstable angina with abnormal Cardiolite study as an outpatient,     catheterization revealing stenosis in the saphenous vein graft to obtuse     marginal, posterior descending artery treated with stenting this     admission.  3. Left ventricular dysfunction, ejection fraction 40 to 45% by     catheterization, 25% by echocardiogram.  4. Peripheral vascular disease with bilateral renal artery stenting in 3/03,     now with an 80% left renal artery in stent restenosis.  5. Hypertension, controlled.  6. Non-insulin dependent diabetes.  7. Treated hyperlipidemia.   HOSPITAL COURSE:  The patient is a 60 year old female followed by Dr. Jacinto Halim,  Dr. Kevan Ny with a history of coronary disease.  She had bypass surgery in  9/02.  She has had left ventricular dysfunction in the past, and an  echocardiogram done on 08/04/02, showed her ejection fraction to be 25%.  Cardiolite study done on 08/05/02 for chest pain indicated inferior lateral  scar with moderate amount of inferior lateral and lateral wall ischemia.  Ejection fraction at Cardiolite was 29%.  It was elected to set her up for  same day catheterization.  This was done on 08/12/02.  This revealed a  patent LIMA to LAD, patent SVG to the first diagonal, but a 99% stenosis  after the anastomosis.  The SVG to the ramus intermedius was patent.  The  SVG to the OM and PDA had an 80% to 90% stenosis mid graft.  The native RCA  was occluded and a nondominant  vessel.  Ejection fraction was 40 to 45%.  Abdominal angiogram showed a patent right renal artery stent and an 80% left  renal artery in stent restenosis.  The patient underwent percutaneous  transluminal coronary angioplasty and stenting to the saphenous vein graft  to the obtuse marginal and posterior descending artery with an Express  stent.  Dr. Jacinto Halim achieved good final results.  The first diagonal after the  graft was a small vessel, and this will be treated medically.  Post  procedure, she has done well.  She will be discharged later on 08/13/02.  We  did adjust her medications, adding Imdur, Norvasc, and Plavix.  She may  require followup Dopplers and possibly intervention to the left renal  artery.  At discharge, her blood pressure is normal, and her creatinine is  1.0.   DISCHARGE  MEDICATIONS:  1. Aspirin 81 mg q.d.  2. Plavix 75 mg q.d.  3. Imdur 30 mg q.d.  4. Avapro 150 mg b.i.d.  5. Coreg 25 mg b.i.d.  6. Lasix 40 mg q.d.  7. Glucotrol XL 5 mg q.d.  8. Zocor 40 mg q.d.  9. Nitroglycerin sublingual p.r.n.  10.      Norvasc 5 mg q.d.   LABORATORY DATA:  White count 7.4, hemoglobin 12.0, hematocrit 35.3,  platelets 192.  Sodium 137, potassium 3.8, BUN 14, creatinine 1.0.  EKG  shows sinus rhythm, nonspecific ST-T changes with some lateral T-wave  inversion.   DISPOSITION:  The patient is discharged in stable condition, and will follow  up with Dr. Jacinto Halim as an outpatient.  She may need further intervention to  the left renal artery, this will be deferred to Dr. Jacinto Halim.     Abelino Derrick, P.A.                      Cristy Hilts. Jacinto Halim, M.D.    Lenard Lance  D:  08/13/2002  T:  08/15/2002  Job:  [id number]   cc:   Duncan Dull, M.D.  [address]  [place]  [place] [address]  Fax: [phone]

## 2011-03-16 NOTE — Cardiovascular Report (Signed)
NAME:  Penny James, Penny James                     ACCOUNT NO.:  [id number]   MEDICAL RECORD NO.:  [id number]                   PATIENT TYPE:  OIB   LOCATION:  2852                                 FACILITY:  [facility]   PHYSICIAN:  Armanda Magic, M.D.                  DATE OF BIRTH:  06/10/1951   DATE OF PROCEDURE:  12/27/2003  DATE OF DISCHARGE:                              CARDIAC CATHETERIZATION   PROCEDURE:  Left heart catheterization, coronary angiography, left  ventriculography, saphenous vein graft angiography and left internal mammary  artery angiography.   CARDIOLOGIST:  Armanda Magic, M.D.   REFERRING PHYSICIAN:  Duncan Dull, M.D.   INDICATIONS:  Chest pain and unstable angina with known coronary disease.   COMPLICATIONS:  None.   ACCESS:  IV access via right femoral artery 6 French sheath.   HISTORY:  This is a 60 year old black female with a history of hypertension,  diabetes mellitus, hyperlipidemia and a history of coronary artery disease  status post coronary artery bypass grafting who presents with increasing  chest pain.  She also has a history of renal artery stenosis in the past and  successful renal artery angioplasty.  She now presents for heart  catheterization due to increasing chest pain as well as abnormal Cardiolite  showing a partially reversible apical defect.   DESCRIPTION OF PROCEDURE:  The patient is brought to the cardiac  catheterization laboratory in the fasting, nonsedated state.  Informed  consent was obtained.  The patient was connected to continuous heart rate  and pulse oximetry and intermittent blood pressure monitoring.  The right  groin was  prepped and draped in a sterile fashion.  Then 1% Xylocaine was used for  local] anesthesia.  There was great difficulty in accessing the femoral  artery and Dr. Katrinka Blazing actually had to help in gaining access into the right  femoral artery which she did using a modified ed Seldinger technique under  fluoroscopic guidance and inserted a 6 French arterial sheath.  Under  fluoroscopic guidance, a 6 [place] JL4 catheter was placed into the left  coronary artery.  Multiple cine films were taken in a 30 degree RAO and 40  degree LAO views.  This catheter was then exchanged out for a 6 [place] JR4  catheter which was placed under fluoroscopic guidance into the area of the  right coronary artery, but the coronary ostium could not be found.  The  catheter was then pulled back into the first vein graft which was the vein  graft to the OM-PDA.  Multiple cine films were taken in 30 degree RAO and 40  degree LAO views.  The catheter was then pulled back into the vein graft to  the ramus.  Multiple cine films were taken in 30 degree RAO and 40 degree  LAO views.  The catheter was then pulled back into the vein graft of the  saphenous vein graft to the diagonal  and multiple cine films were taken in  30 degree RAO view and 40 degree LAO views.  The catheter was then pulled  back and advanced into the subclavian artery under fluoroscopic guidance and  then engaged into the ostium of the LIMA.  Multiple cine films were taken in  30 degree RAO and 40 degree RAO views.  The catheter was then removed over a  guidewire and a 6 [place] Noto right coronary artery catheter was placed into  the area of the right coronary artery and again the catheter was advanced  from the aortic valve all the way up the aortic arch without being able to  identify a right coronary artery ostium.  The catheter was then exchanged  out of the guidewire, and a 6 French angled pigtail catheter was placed  under fluoroscopic guidance into the left ventricular cavity.  The left  ventriculography was performed using 30 mL of contrast at 15 mL per second  under fluoroscopic guidance in the 30 degree RAO views.  The catheter was  then pulled back across the aortic valve with no significant aortic valve  gradient noted.  The catheter was then  removed over a guidewire and a 6  [place] JR4 catheter was then placed back into the area of the right coronary  artery but no right coronary ostium could ever be noted, and this catheter  was then removed over a guidewire.  At the end of the procedure, all  catheters and sheaths were removed.  Manual compression was performed until  adequate hemostasis was obtained.  The patient was transferred back to her  room in stable condition.   RESULTS:  1. Left ventricular systolic function was normal at 55% with mild inferior     wall hypokinesis.  2. The right coronary artery ostium could never been found but on     catheterization from 08/12/02 it was noted to be nondominant and occluded     proximally.  3. The left main coronary artery is widely patent and is very large.  4. The left circumflex is a very large caliber vessel and a dominant vessel.     It gives origin to a very tiny obtuse marginal but then the main first     obtuse marginal branch is a moderate size vessel as well as giving rise     to a second obtuse marginal and then a large posterior descending artery.     Just before the origin of the obtuse marginal I there was a 70-80%     stenosis.  There is also a 90% stenosis just after the takeoff of the     first and second marginals.  The ongoing circumflex which becomes the     distal PDA has a 90% proximal narrowing, and the distal PDA is diffusely     diseased.  The ramus intermedius is occluded 100% at its ostium.  It is     diffusely diseased past the saphenous vein graft.  5. The left anterior descending artery is a large caliber vessel that     extends to the apex and has a 70% ostial stenosis and then gives rise to     a first diagonal vessel which is severely diseased.  The LAD is narrowed     in its mid portion to approximately 40% and then the LIMA comes in the     midportion of the LAD.  There is evidence of competitive flow when the  left coronary artery is  injected through the LIMA.  The saphenous vein     graft to the OM and PDA is widely patent.  There is distal disease of the     PDA past the graft insertion.  The LIMA to the LAD is widely patent     although there is severely diffuse disease distally after the graft     insertion of the LAD.  The saphenous vein graft to the diagonal is     patent, however, distal to the insertion site there is diffuse disease     that is fairly high grade.  The saphenous vein graft to the ramus is     widely patent until the insertion site at which point there appears to be     about a 70% narrowing.   IMPRESSION:  1. Severe three vessel coronary disease.  Saphenous vein graft to the     diagonal widely patent with diffuse disease distally.  Saphenous vein     graft to the obtuse marginal and posterior descending artery is widely     patent with diffuse disease distally.  Saphenous vein graft to the ramus     intermedius widely patent until the graft insertion site where there     appears to be up to 70% narrowing at the insertion.  The left internal     mammary artery to the left anterior descending is widely patent with     diffuse disease of the distal left anterior descending distal to the     insertion of the graft  2. Normal left ventricular systolic function with an ejection fraction of     55% with mild inferior hypokinesis.   PLAN:  1. Medical management with aspirin, Plavix, Lasix, Avapro.  2. Continue Coreg 25 mg b.i.d.  3. Increase Norvasc to 7.5 mg a day.  4. Add Imdur 30 mg a day.  5. She will follow up with me in two weeks.                                               Armanda Magic, M.D.    TT/MEDQ  D:  12/27/2003  T:  12/27/2003  Job:  [id number]   cc:   Duncan Dull, M.D.  [address]  [place]  [place] [address]  Fax: [phone]

## 2011-03-16 NOTE — Discharge Summary (Signed)
. [facility]  Patient:    Penny James, Penny James Visit Number: [id number] MRN: [id number]          Service Type: DSU Location: [phone] Attending Physician:  Pamella Pert Dictated by:   Marya Fossa, P.A. Admit Date:  12/31/2001 Discharge Date: 01/01/2002   CC:         Penny James, M.D.  Duncan Dull, M.D.   Discharge Summary  DATE OF BIRTH:  08-02-2051  [facility] MEDICAL RECORD NUMBER:  [id number].  ADMISSION DIAGNOSES: 1. Uncontrolled hypertension. 2. Abnormal renal duplex scan. 3. Coronary artery disease, status post coronary artery bypass graft. 4. Ejection fraction 35%. 5. Hypercholesterolemia. 6. Diabetes mellitus. 7. Obesity.  DISCHARGE DIAGNOSES: 1. Uncontrolled hypertension, now improved post renal angiography. 2. Abnormal renal duplex scan, status post renal angiography with intervention    to the left and right renal arteries. 3. Coronary artery disease, status post coronary artery bypass graft. 4. Ejection fraction 35%. 5. Hypercholesterolemia. 6. Diabetes mellitus. 7. Obesity.  HISTORY OF PRESENT ILLNESS:  Penny James is a 60 year old African-American female with uncontrolled hypertension, diabetes, coronary artery disease with severe left ventricular dysfunction, hypercholesterolemia, and obesity.  In the past during cardiac catheterization she had been noted to have an 80% left renal artery stenosis.  She had a followup renal duplex scanning which demonstrated greater than 60% reduction of the lumen of the left renal artery and suggested normal velocities on the right.  This was performed on November 14, 2001.  The patient is now admitted for elective renal angiography with probable intervention for uncontrolled hypertension and abnormal renal studies.  The risks and benefits have been reviewed with the patient, and she wishes to proceed.  This is being performed both for  control of hypertension, and also renal preservation.  PROCEDURES:  Renal angiography with intervention by Dr. Jeanella Cara on 12/31/01.  COMPLICATIONS:  None.  CONSULTATIONS:  None.  HOSPITAL COURSE:  Penny James was admitted to [facility] on 12/31/01, for elective renal angiography with Dr. Jeanella Cara, as mentioned above. Pre-procedure laboratory studies showed a potassium of 4.0, BUN 14, creatinine 0.9, and hemoglobin of 11.7.  INR 1.1.  Of note, the patient has iron-deficiency anemia history, and this will be followed up by her primary care physician.  The patient was taken to the [facility] Lab by Dr. Jeanella Cara on 12/31/01, for renal angiography.  This revealed a left renal artery stenosis of 60 to 70%, and a right renal artery stenosis of 80%.  Dr. Jacinto Halim proceeded with PTA and stenting of the left renal artery using a 5.0 x 12 hurculink stent.  The right renal artery was intervened upon as well, reducing an 80% lesion to 0% using a 5.0 x 12 Genesis over Aviator stent.  The procedure was successful, and the patient tolerated the procedure well.  On 01/01/02, the patient continues to remain stable.  Blood pressure 138/94. Labs are within normal limits.  BUN 13, creatinine 0.9.  Hemoglobin is low, but stable at 11.1.  Dr. Jacinto Halim examined the patient and felt she is ready to go, groin is stable.  DISCHARGE MEDICATIONS:  1. Plavix 75 mg p.o. q.d. x1 month.  2. Coreg 25 mg b.i.d.  3. Avapro 150 mg b.i.d.  4. Lasix 40 mg q.d.  5. Aspirin 81 mg q.d.  6. Digoxin 0.125 mg q.d.  7. Zocor 40 mg q.d.  8. Prevacid 30 mg q.d.  9. Glucotrol XL  5 mg q.d. 10. Nitroglycerin p.r.n. chest pain.  ACTIVITY:  No strenuous activity, lifting more than 5 pounds, or driving for two days.  DIET:  Low fat, low cholesterol, low salt diabetic diet.  WOUND CARE:  She may shower.  She is asked to call the office with any problems or questions.  FOLLOWUP:  Dr. Jacinto Halim on January 16, 2002, at 9:30 a.m. Dictated  by:   Marya Fossa, P.A. Attending Physician:  Pamella Pert DD:  01/01/02 TD:  01/02/02 Job: [id number]

## 2011-03-16 NOTE — Discharge Summary (Signed)
[place]. [facility]  Patient:    Penny, James Visit Number: [id number] MRN: [id number]          Service Type: Attending:  Anastasio Auerbach, M.D. Dictated by:   Anastasio Auerbach, M.D. Adm. Date:  07/25/01 Disc. Date: 08/07/01   CC:         Duncan Dull, M.D.             Alleen Borne, M.D.             Madaline Savage, M.D.             [place] Daphine Deutscher, M.D.                           Discharge Summary  DATE OF BIRTH:  [birth year]  DISCHARGE DIAGNOSES:  1. Left pleural effusion.     a. Postoperative coronary artery bypass grafting, 9/02.     b. Thoracentesis on 07/26/01, 08/07/01.  2. Hypotension secondary to dehydration, resolved.  3. Large sacral decubitus ulcer.     a. Local debridements, outpatient followup.  4. Iron-deficiency anemia.     a. Total iron 44, total iron binding capacity 252, percent saturation 17,        B12 532, folate 14.2.     b. Discharge hemoglobin 10.3.  5. Coronary artery disease.     a. Coronary artery bypass graft x 3, in 9/02.  Bypass #1, presented with        unstable angina and pulmonary edema.  Postoperative complications of        bleeding and congestive heart failure.     b. Ejection fraction 35%.  6. Peripheral vascular disease.  7. Dyslipidemia.     a. Total cholesterol 214, triglycerides 73, HDL 41, LDL 158.  8. Type 2 diabetes mellitus.     a. Glycated hemoglobin 6.9%, TSH 1.163.  9. History of hypertension. 10. Hiatal hernia/gastroesophageal reflux disease. 11. Depression. 12. Stress incontinence. 13. Bilateral knee degenerative joint disease. 14. Partial hysterectomy in 1985.  ALLERGIES:  No known drug allergies.  DISCHARGE MEDICATIONS:  1. Aspirin 81 mg q.d.  2. Digoxin 0.125 mg q.d.  3. Zocor 20 mg q.d.  4. (Decreased dose) Coreg 3.125 mg at 8 a.m. and 8 p.m. (the patient is to     pick up samples at the office).  5. (Decreased dose) Lasix 60 mg 1/2 pill at 8 a.m. and 1/2 pill at 3 p.m.   (previously one p.o. b.i.d.).  6. Paxil 20 mg q.d.  7. Prevacid 30 mg q.d.  8. (New) Iron sulfate 325 mg p.o. b.i.d.  9. Glucotrol XL 5 mg q.d. (if blood sugar goes lower than 70 in the morning     stop taking the Glucotrol and bring record to Dr. Kevan Ny). 10. Potassium chloride 20 mEq q.d. 11. Extra Strength Tylenol one or two q.4h. p.r.n. pain. 12. Do not take Altace. 13. Avoid Motrin like products.  CONDITION ON DISCHARGE:  Stable.  PHYSICAL EXAMINATION:  VITAL SIGNS:  Blood pressure 114/52, heart rate 90 to 106, respiratory rate 18, oxygen saturations 91 to 96% on room air.  Weight 183.  DISPOSITION:  Home with husband.  RECOMMENDED ACTIVITY:  Be up as much as tolerated.  No driving, no strenuous activity or lifting.  RECOMMENDED DIET:  A 2000 calorie low salt diabetic diet.  Okay to drink water.  Eat a bedtime snack.  DISCHARGE INSTRUCTIONS:  A home health nurse will come out and change dressings b.i.d.  FOLLOWUP: 1. The patient is to contact Dr. Luretha Murphy at [phone] to schedule an    appointment in one to two weeks to follow up the decubitus ulcer. 2. The patient has an appointment to see Dr. Shaune Pollack (number [phone]) on    August 20, 2001, Wednesday, at 3 p.m.  At that visit, Dr. Kevan Ny will be    reviewing her sugar record. 3. The patient is to follow up at Dr. Reino Kent office on August 14, 2001, at    10:40.  At that visit they will check a basic metabolic panel. 4. The patient is to contact Dr. Garen Grams office to schedule a follow-up    appointment in one week.  I did speak with him, and they will contact her    about followup and also about getting an x-ray prior to followup.  CONSULTATIONS: 1. Dr. Laneta Simmers. 2. Dr. Elsie Lincoln. 3. Dr. Daphine Deutscher.  PROCEDURES: 1. Thoracentesis on 07/26/01. 2. Hydrotherapy and debridement of decubitus ulcer. 3. Bedside surgical irrigation and debridement of abscess on sacral/decubitus    on 08/01/01 (Dr. Carolynne Edouard).  HOSPITAL  COURSE:  #1 - LEFT PLEURAL EFFUSION:  Penny James is a 60 year old Caucasian female who was in the hospital from September 9 through July 18, 2001, after she underwent coronary artery bypass surgery.  She presented at that time with unstable angina and pulmonary edema.  A postoperative course was complicated by bleeding and congestive heart failure.  She was sent home and developed worsening shortness of breath, and one week later returned to the hospital. She was noted to have this large effusion.  She underwent thoracentesis by Dr. Laneta Simmers on 9/28.  She did have an improvement of her symptoms.  The effusion slowly reaccumulated, and another 1100 cc was removed on August 07, 2001, the day of discharge.  Her saturations are 92 to 96%.  She knows to return if she develops worsening shortness of breath.  She will follow up with Dr. Laneta Simmers in one week, and he will repeat the chest x-ray.  This was a transudative effusion.  #2 - HYPOTENSION/DEHYDRATION:  Penny James was on diuretics and antihypertensives and cardioprotective medications.  We ended up discontinuing her Altace and reducing her Coreg dramatically.  Her blood pressure was in the one teens upon discharge.  These will be titrated up as an outpatient by Dr. Elsie Lincoln.  #3 - LARGE SACRAL ULCER:  Penny James had a significant decubiti after her prolonged hospital stay and bedrest.  On presentation, we did ask Dr. Daphine Deutscher to see the patient.  He did perform bedside I&D on October 4.  We treated her short-term with antibiotics, but once this area was debrided and drained, it appeared to be uninfected and healing.  She will not go home on antibiotics. Home health will change the dressings b.i.d. and she will follow up with Dr. Daphine Deutscher.  #4 - IRON-DEFICIENCY ANEMIA:  Not unexpected postoperatively.  She will go home on iron therapy.   #5 - CORONARY ARTERY DISEASE:  She remains on a baby aspirin daily.  She does have  hyperlipidemia and will continue on her Zocor.  #6 - DIABETES MELLITUS TYPE 2:  Glycated hemoglobin this admission was 6.9%. TSH 1.63.  Her morning sugars were in the 90 to 120 range.  She had been on Glucophage prior to admission, but given her congestive heart failure and ejection fraction less than 40%, I would try to avoid this  medication.  Given her propensity to retain fluid, Actos and Avandia can also be problematic.  At this point, her sugars are well controlled on a low dose of Glucotrol, and she may even have to stop this if her sugars go too low.  Instructions were given, and she will follow up with Dr. Kevan Ny.  DISCHARGE LABORATORY DATA:  Hemoglobin 10.3, white blood cell count 7800, platelets 306.  Sodium 141, potassium 4.4, chloride 99, bicarbonate 31, BUN 10, creatinine 1.0, glucose 63. Dictated by:   Anastasio Auerbach, M.D. Attending:  Anastasio Auerbach, M.D. DD:  08/07/01 TD:  08/08/01 Job: [id number]

## 2011-03-16 NOTE — H&P (Signed)
[place]. [facility]  Patient:    Penny James, Penny James Visit Number: [id number] MRN: [id number]          Service Type: Attending:  Otilio Connors. Gerri Spore, M.D. Dictated by:   Otilio Connors. Gerri Spore, M.D. Adm. Date:  07/26/01                           History and Physical  CHIEF COMPLAINT: Shortness of breath.  HISTORY OF PRESENT ILLNESS: This 60 year old black female, who was just discharged one week ago from [facility] after a coronary bypass graft surgery, and congestive heart failure, returns having had a week of increasing shortness of breath with ambulation.  She states that she went home from the hospital having cough and having shortness of breath with ambulation, but it had been relieved with rest until the day of admission when the shortness of breath would not go away.  She denies any definite fever but has had chills and sweats.  Cough has been productive, and she has had some pain in her incision site with the cough but no other chest pain.  Denies any of the chest pain she had been having prior to her surgery.  She also denies any nausea, vomiting, diarrhea, fever, abdominal pain, or back pain.  She has had anorexia, but has been managing to ambulate herself in her house every day.  She had had edema at discharge but this had resolved by the last few days.  She denies any new leg pain or swelling.  PAST MEDICAL HISTORY:  1. Adult onset diabetes diagnosed at age 30.  2. Depression.  3. Hypertension.  4. Coronary artery disease, long-standing.  SOCIAL HISTORY: She has never smoked.  Does not drink alcohol.  Lives with her husband.  FAMILY HISTORY: Significant for diabetes and heart disease in siblings.  REVIEW OF SYSTEMS: Denies any headache, nasal congestion or drainage, syncope or dizziness, palpitations, or urinary symptoms.  PHYSICAL EXAMINATION:  VITAL SIGNS: She is afebrile at the time of admission.  Blood  pressure 106/62, pulse 91, respirations 36.  Pulse oximetry 94% on room air.  GENERAL: She is alert and oriented x 3, though is in distress over pain from her incision with movement and from lying on the hard stretcher.  She becomes noticeably short of breath with any kind of talking.  HEENT: Sclerae anicteric.  Conjunctivae pink.  PERRL.  Fundi benign.  TMs normal.  Oropharynx benign.  Mucous membranes are slightly tacky.  NECK: Supple without adenopathy or thyromegaly.  LUNGS: Clear bilaterally, but difficult examination with her unable to sit up completely.  CARDIAC: Regular rate and rhythm.  No murmurs heard.  ABDOMEN: Soft, obese, nontender.  EXTREMITIES: Without edema.  She has a healing incision from her graft site along the medial aspect of her entire right leg.  No signs of infection.  On the buttocks just at right side of the gluteal crease there is a very large decubitus ulcer approximately 3 cm x 4 cm, with some dried eschar on the outside but obvious purulence on the inside.  There is no Homans sign or tenderness of the calf muscles.  LABORATORY DATA: Work-up in the emergency room included a chest x-ray, which did show a possible left lower lobe density but decreased signs of congestive heart failure from previous films.  CT of the chest did not reveal any sign of pulmonary embolus but did have some possible right middle  lobe atelectasis. EKG showed nonspecific ST-T wave changes with normal sinus rhythm at 93.  Sodium 133, potassium 4.8, chloride 97, CO2 36.8, glucose 107, BUN slightly elevated at 43.  WBC elevated at 13,500 with decreased hemoglobin and hematocrit at 10.8 and 31.1; platelets 469,000.  Pro time slightly elevated at 15 and PTT normal at 29.  Troponins and CK-MBs were negative, and her blood gas showed a pH somewhat elevated at 7.469, pCO2 36.8, bicarbonate 27. Digoxin level is normal at 1.2.  Her pulse oximetry on room air, again,  is 94%.  ASSESSMENT/PLAN:  1. Dyspnea.  Many possible etiologies, most likely includes a combination of     factors including her anemia from her surgery, possible new left lower     lobe pneumonia, possible underlying obstructive lung disease - although     this is unlikely without a past history of smoking, congestive heart     failure, pulmonary embolus, or side effects of her medications including     her Tylox which she has been taking q.4h to q.6h.  Plan - CT of the chest     is negative for pulmonary embolus, and without her evidence of a deep vein     thrombosis by examination this is unlikely.  However, may need pulmonary     angiogram if suspicion persists and/or venous Doppler studies.  Treat for     possible pneumonia with broad-spectrum antibiotic, oxygen supplementation,     supplementation with iron, and follow hematocrit.  Consult cardiology, and     continue to treat for congestive heart failure with Lasix and careful     fluid management and ACE inhibitor.  2. Diabetes.  Will hold her Glucophage as it may not be appropriate to     restart with her history of congestive heart failure.  Continue on     Glucotrol and blood sugar monitoring, diet.  3. Coronary artery disease, status post coronary artery bypass graft.  Will     consult Dr. Elsie Lincoln and Dr. Laneta Simmers to reassess this as it certainly is     related to her recent symptoms.  4. Decubitus ulcer.  Consider consult with general surgeon and ongoing     wound care.  5. Hypertension.  Currently her blood pressure is quite low.  May be     overdiuresed.  May need to hold some of her blood pressure medications     if low blood pressure persists. Dictated by:   Otilio Connors. Gerri Spore, M.D. Attending:  Otilio Connors. Gerri Spore, M.D. DD:  07/26/01 TD:  07/26/01 Job: [id number]

## 2011-03-16 NOTE — H&P (Signed)
NAME:  Penny James, Penny James NO.:  [id number]   MEDICAL RECORD NO.:  [id number]          PATIENT TYPE:  INP   LOCATION:                               FACILITY:  [facility]   PHYSICIAN:  W. Ashley Royalty., M.D.DATE OF BIRTH:  05-25-1951   DATE OF ADMISSION:  04/27/2005  DATE OF DISCHARGE:                                HISTORY & PHYSICAL   This 60 year old female is admitted for unstable angina pectoris.  The  patient has a history of pulmonary edema and diabetes and had bypass  grafting by Dr. Laneta Simmers in 2002.  At that point she had severe three-vessel  disease and had internal mammary grafting to the LAD, a vein graft to the  obtuse marginal and posterior descending arteries, and a vein graft to the  intermediate and a vein graft to the diagonal artery.  The patient had  severe protamine rash causing cardiac collapse and had reoperation for  mediastinal bleeding.  She eventually was able to get out of the hospital  following that.  She was managed previously by [facility] and eventually had bilateral renal artery stenting, but required a  repeat stenting of the left renal artery for restenosis in 2003.  She  eventually was discharged from their practice, Dr. Mayford Knife began to see her.  She presented with increasing chest pain and had cardiac catheterization in  February 2005, at which point in time she had severe native three-vessel  coronary artery disease, left ventricular function showed an ejection  fraction of 55% with mild anterior wall hypokinesis.  The internal mammary  graft to the LAD was patent.  The vein graft to the OM and PDA was widely  patent, but there was distal disease at the PD past the graft insertion.  There was severe diffuse disease distally at the  insertion site of the LAD,  the vein graft to the diagonal was patent, but there was distal disease high-  grade in the diagonal.  There was also a vein graft disease that had a  70%  narrowing in the insertion site.  It was thought that she should be  continued to be managed medically.  She evidently has had financial  difficulties recently and lost her insurance and has not seen Dr. Mayford Knife in  some time.  She has been able to take her medications however, but has  remained obese.  She has had increasing chest pain over the past week  described as pressure or tightness with exertion, has had  increasing  frequency of nitroglycerin.  Today she had significant substernal chest  discomfort radiating to the arm and became frightened and came to the  emergency room.  She is admitted at this time for treatment of unstable  angina.   PAST MEDICAL HISTORY:  Longstanding diabetes mellitus.  She has a previous  history of renovascular hypertension and severe uncontrolled hypertension.  She has a history of bipolar disorder and longstanding coronary artery  disease.   PREVIOUS SURGERIES:  CABG, partial hysterectomy.   SHE IS ALLERGIC TO PROTAMINE.   CURRENT MEDICATIONS:  List is extensive.  She is currently on:  1.  Nitroglycerin p.r.n.  2.  Coreg 25 mg b.i.d.  3.  Plavix 75 mg daily.  4.  IMDUR 60 mg daily.  5.  Furosemide 40 mg daily.  6.  Avapro 150 mg daily.  7.  Norvasc 5 mg daily.  8.  Glucotrol XL 10 mg daily.  9.  Zocor 80 mg daily.  10. Zetia 10 mg daily.  11. Aspirin 81 mg daily.  12. Risperdal 1 mg b.i.d.  13. Centrum Silver daily.  14. Prevacid 30 mg daily.  15. Lantus insulin 50 unit at bedtime if her sugar is above 120.   FAMILY HISTORY:  Positive for diabetes and heart disease.   SOCIAL HISTORY:  She has been married for 34 years, has three children, 12  grandchildren, 2 great grandchildren.  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   REVIEW OF SYSTEMS:  She has been obese for several years.  No eye  problems.  No difficulty swallowing.  No history of ulcers.  She does have a history of  depression and bipolar disorder that she takes Risperdal for.  She does have  some urinary frequency.  Does have some numbness in her lower feet, has mild  edema at times and prior history of flash pulmonary edema.   On examination she is an obese female who in no acute distress.  Blood  pressure 140/70, pulse currently 72 and regular.  SKIN:  Warm and dry without mass or lesions.  ENT:  EOMI, PERRLA.  CNS clear.  Fundi not examined.  Pharynx negative.  NECK:  Supple, no masses, no JVD, thyromegaly or bruits.  LUNGS:  Clear to A&P.  CARDIOVASCULAR:  Normal S1 and S2.  There is no S3, S4 is noted, there is no  murmur noted. ABDOMEN:  Obese, soft and nontender.  Femoral pulses 2+  distally, distal pulses are only 1+.   ADMISSION LABORATORY DATA:  Shows a hemoglobin of 11.6, hematocrit 34,  normal indices.  Glucose is now 63, BUN 11, creatinine 1.3.  Initial point  of care enzymes were negative.  12-Lead EKG shows LVH with some ST-T wave  changes.   IMPRESSION:  1.  Chest pain suggestive of unstable angina.  2.  Coronary artery disease with previous bypass grafting and significant      distal disease in the coronary arteries.  3.  Insulin-dependent diabetes mellitus.  4.  Hypertension with renovascular hypertension previous renal artery      stenting with restenosis treated with      balloon angioplasty of the left renal artery.  5.  History of bipolar disorder.   RECOMMENDATIONS:  Admit, begin Lovenox, may need to adjust diabetic  medicines.       WST/MEDQ  D:  04/27/2005  T:  04/28/2005  Job:  [id number]   cc:   Duncan Dull, M.D.  [address]  [address]  [place]  [place] [address]  Fax: [phone]   Armanda Magic, M.D.  [address], [address]  [place], [place] [address]  Fax: [phone]

## 2011-03-16 NOTE — Cardiovascular Report (Signed)
NAME:  Penny James, Penny James                     ACCOUNT NO.:  [id number]   MEDICAL RECORD NO.:  [id number]                   PATIENT TYPE:  OIB   LOCATION:  2899                                 FACILITY:  [facility]   PHYSICIAN:  Cristy Hilts. Jacinto Halim, M.D.                  DATE OF BIRTH:  28-Oct-1951   DATE OF PROCEDURE:  09/09/2002  DATE OF DISCHARGE:                              CARDIAC CATHETERIZATION   PROCEDURE PERFORMED:  1. Abdominal aortogram.  2. Selective left renal arteriography.  3. Percutaneous transluminal angioplasty of in-stent restenotic left renal     artery.   INDICATIONS FOR PROCEDURE:  The patient is a 60 year old female with a  history of hypertension, diabetes, hypercholesterolemia, history of coronary  disease and peripheral vascular disease.  Has been having increasing  difficulty to control her blood pressure.  She has previously undergone  renal angioplasty in the past.  She also underwent a cardiac  catheterization.  At that time, she was found to have high-grade renal  artery restenosis; hence, she was brought in for an elective PTA of the left  renal artery.   ANGIOGRAPHIC DATA:  1. Abdominal aortogram:  The abdominal aortogram revealed mild     atherosclerotic changes of the abdominal aorta.  The right renal artery     stent is widely patent.  The left renal artery stent shows haziness with     intimal hyperplasia.  2. 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  3. There was severe damping of pressure with blunting of the pressures as     soon as the guide catheter was engaged into the left renal artery.   INTERVENTIONAL DATA:  Successful PTA of in-stent restenosis of the left  renal artery.  The stenosis was reduced from 80-85% to less than 30%.  There  is still a pressure gradient across the left renal artery in the mid segment  by about 30 mmHg; however, because of abdominal and back discomfort during  angioplasty, no  further attempt was made to post dilate it with a higher  sized balloon or higher pressure.   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  The pressure gradient initially was damped with  severe damping noted at the beginning of the procedure and post procedure,  there was about 30 mm of pressure gradient.  Residual stenosis not dilated  secondary to back discomfort.   RECOMMENDATIONS:  Continued aggressive risk factor modification is  indicated.  The patient will be observed overnight and she will be  discharged home in the morning.   TECHNIQUE OF PROCEDURE:  Under usual sterile precautions, the right femoral  arterial access was obtained.  There was difficulty in obtaining the access  as she had fibrous  tissue in the right groin from previous cardiac  catheterizations.  Initially a #5 [place] sheath was utilized to dilate the  artery and then a #6 [place], then eventually, a #7 [place] sheath was  introduced into the right femoral artery.  Then a #7 [place] short LIMA guide  catheter was advanced into the abdominal aorta.  An abdominal aortogram was  performed.  Then the left femoral artery was selectively engaged.  Then a  0.014-inch x 180-cm Stabilizer guidewire was utilized to cross the left  renal artery stent.  The tip of the wire was carefully positioned in the  secondary branch of the left renal artery.  Angiography was performed.  Then  a 5.5 x 12-mm Ultrasoft balloon was utilized to perform multiple balloon  dilatations.  The multiple balloon dilatations from 4 atmospheres of  pressure to all the way up to 10 atmospheres of pressure was performed.  Then the balloon was deflated, pulled back into the guiding catheter, and  arteriography was performed.  After obtaining adequate results, albeit if  not perfect, because of abdominal and back  discomfort during balloon  angioplasty, no further dilatations were attempted.  Then the guidewire was  pulled out.  Angiography was performed, and the guide catheter was  disengaged and pulled out of the body over the 0.035-inch J wire.  Then the  patient was transported to the recovery room in stable condition.  During  the procedure, 5000 units of heparin was administered.  The patient  tolerated the procedure well.                                                Cristy Hilts. Jacinto Halim, M.D.    Pilar Plate  D:  09/09/2002  T:  09/09/2002  Job:  [id number]   cc:   Duncan Dull, M.D.  [address]  [place]  [place] [address]  Fax: [phone]   [facility]

## 2011-03-16 NOTE — Discharge Summary (Signed)
NAME:  Penny James, Penny James                     ACCOUNT NO.:  [id number]   MEDICAL RECORD NO.:  [id number]                   PATIENT TYPE:  OIB   LOCATION:  3714                                 FACILITY:  [facility]   PHYSICIAN:  Cristy Hilts. Jacinto Halim, M.D.                  DATE OF BIRTH:  01/19/1951   DATE OF ADMISSION:  09/09/2002  DATE OF DISCHARGE:  09/10/2002                                 DISCHARGE SUMMARY   DISCHARGE DIAGNOSES:  1. 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  2. Coronary artery disease, coronary artery bypass grafting September 2002,     with percutaneous coronary intervention and stenting to the saphenous     vein graft to obtuse marginal, posterior descending artery August 12, 2002.  3. Left ventricular dysfunction with ejection fraction of 40 to 45%.  4. Controlled hypertension.  5. Treated hyperlipidemia.   HOSPITAL COURSE:  The patient is a 60 year old female followed by Dr. Jacinto Halim  and Dr. Kevan Ny who has a history of as noted above.  She had bypass surgery  September 2002 with LIMA to LAD, SVG to the OM1 and PDA and SVG to the ramus  and SVG to the diagonal.  In October of this year, she had stenting to the  SVG to the OM and PDA.  At that time she was noted to have an 80 to 90% left  renal artery restenosis. The previously placed right renal artery stent was  widely patent.  She was admitted for elective PTA of the left renal artery  September 09, 2002.  This was performed by Dr. Jacinto Halim with good results.  Plan  is for continued medical therapy.  We did cut back her Avapro to 150 mg once  a day and stopped her Lanoxin prior to discharge.   DISCHARGE MEDICATIONS:  1. Aspirin 81 mg a day.  2. Plavix 75 mg a day.  3. Imdur 30 mg a day.  4. Avapro 150 mg once a day.  5. Coreg 25 mg twice a day.  6. Lasix 40 mg a  day.  7. Zocor 40 mg a day.  8. Glucotrol XL 5 mg a day.   LABORATORY DATA:  White count 6.0, hemoglobin 11.9, hematocrit 34.7,  platelets 235. Sodium 137, potassium 3.8, BUN 7, creatinine 0.7.   DISPOSITION:  The patient is discharged in stable condition and will have  renal artery Doppler study done Monday, September 21, 2002, at 11 a.m. and  will follow up with Dr. Jacinto Halim on September 30, 2002, at 9:45.  The patient  will have follow-up as noted above.      Abelino Derrick, P.A.  Cristy Hilts. Jacinto Halim, M.D.   Lenard Lance  D:  09/10/2002  T:  09/10/2002  Job:  [id number]

## 2011-03-16 NOTE — Discharge Summary (Signed)
[place]. [facility]  Patient:    Penny James, Penny James Visit Number: [id number] MRN: [id number]          Service Type: MED Location: 2000 2005 01 Attending Physician:  Cleatrice Burke Dictated by:   Adair Patter, P.A. Admit Date:  07/01/2001 Disc. Date: 07/15/01   CC:         Madaline Savage, M.D.   Discharge Summary  DATE OF BIRTH:  09-14-1951  ADMISSION DIAGNOSIS:  Flash pulmonary edema.  SECONDARY DIAGNOSES: 1. Non-insulin-dependent diabetes mellitus. 2. Obesity. 3. Hypertension. 4. Postoperative coagulation and mediastinal bleeding.  DISCHARGE DIAGNOSIS:  Coronary artery disease.  PROCEDURES: 1. Cardiac catheterization. 2. Pre-CABG Dopplers. 3. Bilateral carotid duplex. 4. Coronary artery bypass graft x 5. 5. Mediastinal re-exploration.  HOSPITAL COURSE:  Ms. Birkhead was admitted to [facility] on July 02, 2001, secondary to flash pulmonary edema.  Because of the patients flash pulmonary edema cardiology was called, and the patient underwent cardiac workup.  She subsequently had cardiac catheterization which revealed the patient had significant coronary artery disease.  Because of this, Dr. Laneta Simmers was consulted.  On July 07, 2001, Dr. Laneta Simmers performed a coronary artery bypass graft x 5 with the left internal mammary artery anastomosed to the left anterior descending artery, a sequential saphenous vein graft to the obtuse marginal and posterior descending arteries, a saphenous vein graft to the intermediate artery, and a saphenous vein graft to the diagonal artery. Intraoperatively the patients hospital course was complicated by severe protamine reaction causing cardiac collapse, requiring reinstitution of cardiac bypass.  Because of the patients severe protamine reaction, the patient was taken out of the operating room without reversing heparin or protamine.  She was aggressively transfused with platelets and  fresh frozen plasma.  Despite infusion of platelets and fresh frozen plasma, the patient continued to have a significant amount of mediastinal bleeding.  Because of this, on the same day as her CABG, Dr. Laneta Simmers took the patient back to the operating room and performed a mediastinal exploration and evacuation of mediastinal hematoma secondary to this postoperative coagulation and mediastinal bleeding.  The patient tolerated the procedure well. Postoperatively the patient required pressor support with milrinone and dopamine.  This was successfully weaned after several days of support.  Her postoperative course was characterized by excessive volume overload.  This was treated with diuresis with loop diuretics.  The patient continued to progress slowly but steadily throughout her postoperative course.  She was started on ACE inhibitor secondary to a low ejection fraction.  She was subsequently discharged home in stable condition on July 15, 2001.  DISCHARGE MEDICATIONS: 1. Tylox 1-2 tablets every four to six hours as needed for pain. 2. Aspirin 325 mg 1 daily. 3. Toprol XL 25 mg 1 daily. 4. Prevacid, the patient was told to resume her home dose. 5. Glucotrol XL 5 mg 1 tablet daily. 6. Altace 2.5 mg 1 daily. 7. Lasix 40 mg 1 daily. 8. Potassium chloride 20 mEq 1 daily. 9. Zocor 20 mg 1 daily.  ACTIVITY:  The patient was told to avoid driving, strenuous activity, and lifting heavy objects.  She was told to use her incentive spirometer daily and told to walk daily.  DIET:  1800 calorie ADA diet.  WOUND CARE:  The patient was told she could shower and clean her incisions with soap and water.  DISPOSITION:  Home.  FOLLOW-UP:  The patient was told to call her cardiologist, Dr. Elsie Lincoln, for an appointment  in two weeks.  She was provided with Dr. Reino Kent telephone number.  She was told to follow up with ______ Dr. Evelene Croon on Tuesday, August 05, 2001, at 9:30 a.m.  She was told to  go to [facility] one hour before her appointment with Dr. Laneta Simmers.   Dictated by:   Adair Patter, P.A. Attending Physician:  Cleatrice Burke DD:  07/14/01 TD:  07/14/01 Job: [id number]

## 2011-03-16 NOTE — Cardiovascular Report (Signed)
[place]. [facility]  Patient:    MARYLEN, ZUK Visit Number: [id number] MRN: [id number]          Service Type: DSU Location: [id number] Attending Physician:  Pamella Pert Dictated by:   Delrae Rend, M.D. Proc. Date: 12/31/01 Admit Date:  12/31/2001   CC:         [facility], [address], [place] [address]  Duncan Dull, M.D.   Cardiac Catheterization  PROCEDURES PERFORMED: 1. Abdominal aortogram. 2. Selective right and left renal arteriography. 3. Percutaneous transluminal angioplasty/stenting of the left and right renal    arteries.  INDICATIONS:  Ms. Rajagopalan is a 60 year old African-American female with a longstanding history of uncontrolled hypertension who underwent renal duplex scan for evaluation of renovascular hypertension.  She was found to have abnormal renal duplex scan; hence, she was brought to the catheterization lab to reevaluate her renal arterial tree and also to evaluation for possible PTA. Of note, patient has had cardiac catheterization on July 03, 2001, which had revealed significant left renal artery stenosis.  HEMODYNAMIC DATA:  The opening central aortic pressures were 207/98 with a mean of 139 mmHg.  The closing pressures were read the same.  ABDOMINAL AORTOGRAM: 1. The abdominal aortogram revealed the presence of one renal artery on the    left and one renal artery on the right.  There appeared to be a high-grade    stenosis of the left renal artery. 2. Selective renal arteriogram:  The left renal artery had about 60-70% ostial    narrowing. 3. Right renal artery:  The right renal artery had about 80% proximal focal    renal artery stenosis.  INTERVENTIONAL DATA: 1. Successful PTA and primary stenting of the left renal artery with a 5.0- x    12-mm Herculink Plus stent deployed at 10 atmospheres of pressure for 45    seconds.  The stent was post dilated to a maximum of 14  atmospheres of    pressure for 45 seconds.  This gave a 5.49-mm lumen diameter.  The stenosis    was reduced from 60-70% to 0%.  There was no evidence of thrombus, no    evidence of dissection at the end of the procedure. 2. Successful PTA/primary stenting of the right renal artery with a 5.0-    x 12-mm Genesis-on-Aviator stent deployed at 12 atmospheres of pressure    (5.1-mm lumen).  The stenosis was reduced from 80% to 0%.  There was no    evidence of dissection, no evidence of thrombus at the end of the    procedure.  TECHNIQUE OF PROCEDURE:  Under usual sterile precautions using a #7 French right femoral arterial access, a #7 [place] FR4 short guide was advanced into the abdominal aorta over the 0.035-mm J wire.  The abdominal aortogram was performed through the guide catheter sheath.  Then the left renal artery was selectively engaged and arteriography was performed.  Then a 190-cm x 0.014-inch stabilizer guide wire was advanced into the left renal artery and the tip of the wire was carefully positioned in the distal secondary branch of the left renal artery.  Then a 5.0- x 12-mm Herculink stent was advanced over this guidewire and after confirming the position of the stent, the stent was deployed at 10 atmospheres of pressure for 45 seconds.  The stent balloon was then deflated, pulled back just outside of the ostium of the left renal artery, and a second  inflation at 10 atmospheres of pressure for 45 seconds was performed.  Renal arteriography was performed.  There was still a residual 20-30% stenosis; hence, the balloon was readvanced back into the stent and the second inflation at 14 atmospheres of pressure for 45 seconds was performed. Then the balloon was withdrawn into the guiding catheter and arteriography was performed.  Excellent result was noted.  There was no residual stenosis.  Then the wire was withdrawn out of the body.  The right renal artery was then selectively  engaged and arteriography was performed.  The arteriogram revealed a high-grade stenosis of the proximal right renal artery.  Then the same Genesis guidewire was readvanced into the right renal artery and the tip of the wire was carefully positioned in the distal renal artery.  Then a 5.0- x 12-mm Genesis-on-Aviator stent was advanced over this guidewire and after confirming the position of the stent, the stent was deployed at 12 atmospheres of pressure for 45 seconds.  The balloon was deflated, pulled back just outside of the ostium of the renal artery, and a second inflation was performed at 12 atmospheres of pressure.  This was performed for 30 seconds. The balloon was deflated, pulled back into the guiding catheter, and arteriography was performed.  Excellent result was noted.  Then after confirming the success, the catheter was removed out of the body over a 0.035-mm J wire.  The patient tolerated the procedure well.  During the procedure, 3000 units of intravenous heparin was administered.  Because of difficult-to-control hypertension, 10 mg of intravenous labetalol was also administered to the patient.  The patient was transferred to recovery in a stable condition.  Right heart catheterization:  Using the mild sedation and local anesthesia, a Swan-Ganz catheter was advanced into the inferior vena cava through an #8 [place] right femoral venous access.  The catheter was advanced and pulmonary capillary wedge was easily obtained.  The right atrial pressure was monitored from the proximal port and pulmonary arterial pressures were monitored. Pulmonary arterial saturation was obtained.  Cardiac output was performed through thermodilution technique.  Then the Swan-Ganz was left in place during the entire procedure of left heart catheterization.  Left heart catheterization:  A #6 French right femoral arterial access was obtained in a similar fashion to the venous access.  A #6 [place]  Multipurpose  B2 catheter was advanced into the ascending aorta over a 0.035-mm J wire.  The catheter was gently advanced to the left ventricle and left ventricular pressures were monitored.  Hand contrast injection of left ventricle was performed both in the LAO and RAO projections.  The catheter was flushed with saline and pulled back into the ascending aorta and pressure gradient across the aortic valve was monitored.  The right coronary artery was selectively engaged and arteriography was performed.  Then the catheter was pulled back into the abdominal aorta and abdominal aortogram was performed.  Then the catheter was pulled out of the body over a 0.035-mm J wire.  Then a #6 [place] diagnostic JL4 catheter was advanced into the ascending aorta and the left main coronary artery was selectively engaged.  Arteriography was performed.  Then the catheter was disengaged from the left main coronary artery and pulled out over the body over a 0.035-mm J wire. Dictated by:   Delrae Rend, M.D. Attending Physician:  Pamella Pert DD:  12/31/01 TD:  12/31/01 Job: [id number]

## 2011-03-16 NOTE — Discharge Summary (Signed)
NAME:  Penny James, Penny James NO.:  [id number]   MEDICAL RECORD NO.:  [id number]          PATIENT TYPE:  INP   LOCATION:  4708                         FACILITY:  [facility]   PHYSICIAN:  Darden Palmer., M.D.DATE OF BIRTH:  1951-05-21   DATE OF ADMISSION:  04/27/2005  DATE OF DISCHARGE:  04/30/2005                           DISCHARGE SUMMARY - REFERRING   FINAL DIAGNOSES:  1.  Chest pain consistent with unstable angina - stabilized on medical      therapy.  2.  Coronary artery disease with previous bypass grafting in 2003.  Grafts      were patent at catheterization in 2005 but with significant distal      disease to the bypass grafts.  3.  Obesity.  4.  Diabetes mellitus.  5.  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  6.  Bipolar disorder.  7.  Anemia, probable iron-deficiency, in need of work-up.   HISTORY:  This 60 year old black female has a history of coronary artery  disease with previous bypass grafting.  She also has had renovascular  hypertension, two previous renal artery stents and had repeat angioplasty of  an in-stent restenosis of the left renal artery previously.  She has been  obese and has had some financial difficulties recently also.  She presented  to the hospital with a one-week history of increasing chest pain that may  have been compatible with unstable angina.  She also had some nausea and  some indigestion type symptoms too.  Please see the previously dictated  history and physical for the remainder of the details.   HOSPITAL COURSE:  Lab data on admission shows an IV conduction delay,  nonspecific changes in the lateral leads which was unchanged.  Serial CPK  and troponins were normal.  Hemoglobin was 11.1, hematocrit 32.5, repeat was  34.  Chemistry panel shows a sodium of 141, potassium 3.8, chloride 107, CO2  27, glucose 168, liver enzymes were normal.  Iron studies showed an  iron of  45, TIBC of 247, percent saturation was 18%, folic was normal and B12 was  824.  This was more consistent with anemia of chronic disease but could have  also been mild iron deficiency.  The patient was placed on Lovenox and  continued on her cardiac medicines.  She had no recurrence of angina.  While  in the hospital she was monitored on telemetry and had no arrhythmias.  The  anemia was evaluated and it was thought that this could be further worked up  as an outpatient.  Because she had no recurrent pain and did have distal  disease noted previously before, it was opted to continue her on medical  therapy.  Her Avapro was increased to 300 mg daily to help with blood  pressure which was somewhat high.  She was stable and thought that she could  be discharged in improved condition and is discharged to continue her follow-  up as an outpatient with Dr. Mayford Knife. She has not seen Dr. Mayford Knife in some  time and is to  have a follow-up appointment within the next week.  Her  discharge condition is improved.   DISCHARGE MEDICATIONS:  1.  Coreg 25 mg b.i.d.  2.  Plavix 75 mg daily.  3.  Isosorbide mononitrate 60 mg daily.  4.  Furosemide 40 mg daily.  5.  Avapro 300 mg daily.  6.  Norvasc 5 mg daily.  7.  Glucotrol XL 10 mg daily.  8.  Zocor 80 mg daily.  9.  Zetia 10 mg daily.  10. Aspirin 81 mg daily.  11. Risperdal 1 mg twice daily.  12. Prevacid 30 mg daily.  13. Lantus insulin.  She is to take an evening dose of 50 units unless she      is hypoglycemic.   She is to call Dr. Mayford Knife to get an appointment in one week.  She is to keep  her regular appointment with Dr. Shaune Pollack and will need to have further  evaluation of the anemia as noted above.  She is instructed to call if she  has recurrent chest pain.       WST/MEDQ  D:  04/30/2005  T:  04/30/2005  Job:  [id number]   cc:   Duncan Dull, M.D.  [address]  [address]  [place]  [place] [address]  Fax: [phone]

## 2011-03-16 NOTE — H&P (Signed)
[place]. [facility]  Patient:    Penny, James Visit Number: [id number] MRN: [id number]          Service Type: DSU Location: [phone] Attending Physician:  Pamella Pert Dictated by:   [facility] [place], [place]. Admit Date:  12/31/2001   CC:         Duncan Dull, M.D.   History and Physical  HISTORY OF PRESENT ILLNESS:  [name] is a 60 year old African-American female who presents for renal arteriography plus-minus intervention.  During her last cardiac catheterization in September 2002, she was found to have an 80% left renal artery stenosis.  She then had a follow-up renal duplex scan in our office which demonstrated greater than 60% reduction in the lumen diameter of the left kidney.  The velocities on the right kidney were normal.  That test was performed on November 14, 2000.  As well, she doe have significant hypertension which has continued to be elevated despite multiple medication therapy.  Therefore she was seen in the office by Dr. Jacinto Halim on November 19, 2001, for evaluation of this renal artery stenosis and it was felt that renal angiography with probable intervention was necessary.  Risks and benefits of the procedure were explained to the patient and she is agreeable to proceed.  PAST MEDICAL HISTORY: 1. CAD.    a. Status post CABG, July 07, 2001, by Dr. Evelene Croon with an LIMA       to the LAD, SVG to OM and PDA sequentially; SVG to intermediate coronary       artery; SVG to diagonal of the LAD. 2. Status post Protamine reaction at time of CABG surgery causing significant    illness. 3. LV dysfunction with EF 35%. 4. Hypercholesterolemia. 5. Non-insulin dependent diabetes mellitus. 6. History of partial hysterectomy.  CURRENT MEDICATIONS: 1. Avapro 150 mg p.o. q.d. 2. Coreg 25 mg p.o. b.i.d. 3. Lasix 40 mg p.o. q.d. 4. Aspirin 81 mg p.o. q.d. 5. Lanoxin 0.125 mg p.o. q.d. 6. Zocor 40 mg p.o.  q.h.s.  ALLERGIES: 1. PENICILLIN. 2. PROTAMINE.  Please note that the patient did have a severe Protamine    reaction at the time of her CABG.  SOCIAL HISTORY:  [name] is married and lives with her husband.  They have three children.  She gets around quite well and is able to do her shopping and cleaning and housework.  She uses no tobacco.  FAMILY HISTORY:  Noncontributory to this admission.  REVIEW OF SYSTEMS:  GASTROINTESTINAL:  No history of peptic ulcer disease, no GI bleed.  NEUROLOGIC:  No CVA, no TIA.  HEMATOLOGY:  No bleeding since her hospitalization for the bypass surgery.  GENERAL:  No fevers, chills. CARDIOVASCULAR:  No significant chest pain, no shortness of breath, or significant swelling.  ENDOCRINOLOGY:  Positive for diabetes mellitus, no thyroid disease.  PHYSICAL EXAMINATION:  GENERAL:  She is mildly obese on general exam, appears in no acute distress.  VITAL SIGNS:  Temperature is 98.5, heart rate 68, respiration 18, blood pressure 145/74.  HEART:  Heart is in regular rhythm with normal S1, S2, no murmur.  LUNGS:  Clear with good breath sounds throughout.  ABDOMEN:  Protuberant, but soft without mass or hepatosplenomegaly, no bruit or pulsatile mass.  EXTREMITIES:  She has trace lower extremity edema on the right with a vein harvest site, otherwise no significant edema.  IMPRESSION/PLAN: 1. At this time [name] has left renal artery stenosis  documented at the    time of her cardiac catheterization and also has undergone renal duplex    scan which also demonstrated significant reduction and limiting _____    left renal artery.  As well, she has had difficult to control hypertension    despite multiple medications.  Therefore at this time she is admitted.    She presents for renal angiography plus-minus intervention by Dr. Skeet Simmer. Risks and benefits of the procedure have been discussed with the    patient. She agrees to proceed. 2.  Hypertension, suboptimally controlled. 3. Coronary artery disease, status post CABG, July 07, 2001, with core    significant for Protamine reaction. 4. History of Protamine reaction at time of CABG, July 07, 2001. 5. Left ventricular dysfunction with EF 35%. 6. Hypercholesterolemia. 7. Non-insulin dependent diabetes mellitus.  Of note, Dr. Kevan Ny just recently    stopped her Glucotrol as her blood sugars were well-controlled.  PRIMARY CARE PHYSICIAN:  Duncan Dull, M.D. Dictated by:   [facility] [place], [place]. Attending Physician:  Pamella Pert DD:  12/31/01 TD:  12/31/01 Job: [id number]

## 2011-03-16 NOTE — Op Note (Signed)
[place]. [facility]  Patient:    Penny James, Penny James Visit Number: [id number] MRN: [id number]          Service Type: Attending:  Alleen Borne, M.D. Dictated by:   Alleen Borne, M.D. Proc. Date: 07/07/01                             Operative Report  PREOPERATIVE DIAGNOSIS:  Mediastinal bleeding, status post coronary artery bypass graft surgery, with protamine allergy and coagulopathy.  POSTOPERATIVE DIAGNOSIS:  Mediastinal bleeding, status post coronary artery bypass graft surgery, with protamine allergy and coagulopathy.  PROCEDURES:  Exploration of the mediastinum, evacuation of clotted blood, and hemostasis.  SURGEON:  Alleen Borne, M.D.  ANESTHESIA:  General endotracheal.  CLINICAL HISTORY:  This patient underwent coronary artery bypass graft surgery earlier today.  She had a severe protamine allergy manifested as anaphylaxis with severe cardiac dysfunction and cardiovascular collapse.  This happened twice, requiring reinstitution of cardiopulmonary bypass.  It occurred with a very small dose of protamine.  Therefore, it was felt that it would be best to wean her off bypass and not use protamine to reverse the heparin.  This was done, and cardiac performance looked quite good.  She was brought back to the surgical intensive care unit coagulopathic and fully heparinized.  She was treated with fresh frozen plasma and platelets to correct any intrinsic coagulopathy, and the heparin was allowed to degrade naturally.  After approximately six to seven hours, the patient began to form some blood clot, and the PTT was measured at about 65 seconds.  The tubes were starting to clot off, and I felt it was necessary to return the patient to the operating room to evacuate blood clots from the mediastinum and the chest tubes and complete hemostasis.  I discussed the procedure with the patients husband and other family members, including  alternatives, benefits, and risks, and they understood and agreed to proceed.  DESCRIPTION OF PROCEDURE:  The patient was taken back to the operating room in hemodynamically stable condition with blood pressure running in the 90 systolic range.  Pulmonary artery pressure was somewhat elevated with a PAD of about 25.  She was placed on the table in the supine position.  She remained under a general endotracheal anesthesia.  Then the chest was prepped with Betadine soap and solution, draped in the usual sterile manner.  The chest incision was opened and the sternal wires removed.  The sternal edges were separated.  There was a large amount of clotted blood present in the mediastinum.  This was suctioned and irrigated with saline.  The mediastinum was examined for hemostasis.  There were a few small bleeding sites under the sternum, and these were coagulated.  Otherwise, the cannulation sites and the proximal graft sites appeared hemostatic.  The chest tubes were declotted. When I felt that there was adequate hemostasis, the chest tubes were replaced in the previous position.  The pericardium was not closed over the heart. There appeared to be some hypocontractility of the right ventricle with elevated pulmonary artery pressure and CVP.  Therefore, the pericardium was not closed over the heart.  The sternum was closed with double #6 stainless steel wires.  The fascia was closed with continuous #1 Vicryl suture.  A Blake drain was left just above the sternum.  The subcutaneous tissue was closed with continuous 2-0 Vicryl and the skin with 3-0 Vicryl subcuticular closure.  The sponge, needle, instrument counts correct according to the scrub nurse.  A dry sterile dressing was applied over the incision and around the chest tubes, which were hooked to Pleuravac suction.  The patient remained hemodynamically stable and was transported back to the surgical intensive care unit in guarded but stable  condition. Dictated by:   Alleen Borne, M.D. Attending:  Alleen Borne, M.D. DD:  07/08/01 TD:  07/08/01 Job: [id number]

## 2011-06-05 ENCOUNTER — Other Ambulatory Visit: Payer: Self-pay

## 2011-06-05 ENCOUNTER — Ambulatory Visit: Payer: Self-pay | Admitting: Vascular Surgery

## 2011-06-19 ENCOUNTER — Encounter: Payer: Self-pay | Admitting: Vascular Surgery

## 2011-07-04 ENCOUNTER — Emergency Department (HOSPITAL_COMMUNITY)
Admission: EM | Admit: 2011-07-04 | Discharge: 2011-07-04 | Disposition: A | Discharge status: Home / Self Care | Payer: Medicare Other | Attending: Emergency Medicine | Admitting: Emergency Medicine

## 2011-07-04 DIAGNOSIS — E7800 Pure hypercholesterolemia, unspecified: Secondary | ICD-10-CM | POA: Insufficient documentation

## 2011-07-04 DIAGNOSIS — E119 Type 2 diabetes mellitus without complications: Secondary | ICD-10-CM | POA: Insufficient documentation

## 2011-07-04 DIAGNOSIS — N61 Mastitis without abscess: Secondary | ICD-10-CM | POA: Insufficient documentation

## 2011-07-04 DIAGNOSIS — N644 Mastodynia: Secondary | ICD-10-CM | POA: Insufficient documentation

## 2011-07-04 DIAGNOSIS — Z79899 Other long term (current) drug therapy: Secondary | ICD-10-CM | POA: Insufficient documentation

## 2011-07-04 DIAGNOSIS — I10 Essential (primary) hypertension: Secondary | ICD-10-CM | POA: Insufficient documentation

## 2011-07-04 DIAGNOSIS — I2510 Atherosclerotic heart disease of native coronary artery without angina pectoris: Secondary | ICD-10-CM | POA: Insufficient documentation

## 2011-07-23 ENCOUNTER — Encounter: Payer: Self-pay | Admitting: Vascular Surgery

## 2011-07-24 ENCOUNTER — Ambulatory Visit (INDEPENDENT_AMBULATORY_CARE_PROVIDER_SITE_OTHER): Payer: Medicare Other | Admitting: Vascular Surgery

## 2011-07-24 ENCOUNTER — Other Ambulatory Visit (INDEPENDENT_AMBULATORY_CARE_PROVIDER_SITE_OTHER): Payer: Medicare Other | Admitting: *Deleted

## 2011-07-24 ENCOUNTER — Encounter: Payer: Self-pay | Admitting: Vascular Surgery

## 2011-07-24 VITALS — BP 152/75 | HR 67 | Resp 16 | Ht 63.0 in | Wt 190.0 lb

## 2011-07-24 DIAGNOSIS — I6529 Occlusion and stenosis of unspecified carotid artery: Secondary | ICD-10-CM

## 2011-07-24 NOTE — Progress Notes (Signed)
The patient presents today for followup of her known carotid artery occlusive disease. She specifically denies any episodes of amaurosis fugax, transient ischemic attack or stroke. She denies any new cardiac difficulty and reports that since our last visit she has been able to stop her insulin and is on oral medications only.  Past Medical History  Diagnosis Date  . Diabetes mellitus   . Hyperlipidemia   . Hypertension   . CHF (congestive heart failure)   . Arthritis   . Anemia   . Carotid artery occlusion   . Coronary atherosclerosis     History  Substance Use Topics  . Smoking status: Never Smoker   . Smokeless tobacco: Never Used  . Alcohol Use: No    Family History  Problem Relation Age of Onset  . Heart disease Brother     Allergies  Allergen Reactions  . Penicillins Rash  . Protamine     Unknown reaction    Current outpatient prescriptions:amLODipine (NORVASC) 5 MG tablet, Take 5 mg by mouth daily.  , Disp: , Rfl: ;  aspirin 325 MG tablet, Take 325 mg by mouth daily.  , Disp: , Rfl: ;  carvedilol (COREG) 25 MG tablet, Take 25 mg by mouth 2 (two) times daily with a meal.  , Disp: , Rfl: ;  clopidogrel (PLAVIX) 75 MG tablet, Take 75 mg by mouth daily.  , Disp: , Rfl: ;  ezetimibe (ZETIA) 10 MG tablet, Take 10 mg by mouth daily.  , Disp: , Rfl:  ferrous sulfate 325 (65 FE) MG tablet, Take 325 mg by mouth daily with breakfast.  , Disp: , Rfl: ;  furosemide (LASIX) 40 MG tablet, Take 40 mg by mouth 2 (two) times daily.  , Disp: , Rfl: ;  glipiZIDE (GLUCOTROL) 10 MG tablet, Take 10 mg by mouth daily.  , Disp: , Rfl: ;  glucagon (GLUCAGEN) 1 MG SOLR, Inject into the vein once as needed.  , Disp: , Rfl: ;  irbesartan (AVAPRO) 150 MG tablet, Take 150 mg by mouth at bedtime.  , Disp: , Rfl:  isosorbide mononitrate (IMDUR) 30 MG 24 hr tablet, Take 30 mg by mouth daily.  , Disp: , Rfl: ;  lansoprazole (PREVACID) 15 MG capsule, Take 15 mg by mouth daily.  , Disp: , Rfl: ;  mometasone  (NASONEX) 50 MCG/ACT nasal spray, Place 2 sprays into the nose daily.  , Disp: , Rfl: ;  Multiple Vitamins-Minerals (MULTIVITAL) tablet, Take 1 tablet by mouth daily.  , Disp: , Rfl:  niacin (NIASPAN) 1000 MG CR tablet, Take 1,000 mg by mouth at bedtime.  , Disp: , Rfl: ;  rosuvastatin (CRESTOR) 40 MG tablet, Take 40 mg by mouth daily.  , Disp: , Rfl:   BP 152/75  Pulse 67  Resp 16  Ht 5\' 3"  (1.6 m)  Wt 190 lb (86.183 kg)  BMI 33.66 kg/m2  Body mass index is 33.66 kg/(m^2).       Physical exam: Well-developed moderately obese black female in no acute distress. Carotid arteries with a soft left bruit and no bruits the right. Grossly intact neurologically. Heart regular rate and rhythm. Chest without wheezes and clear bilaterally skin without ulcers or rashes. Musculo skeletal without major deformity.  Carotid duplex: Extensive calcified plaque bilaterally moderate to severe stenosis bilaterally  Impression and plan: Stable moderate to severe bilateral carotid stenosis. I discussed symptoms of carotid disease with the patient she will notify us a meal he should this occur. Otherwise  we will see her at 6 month intervals for surveillance of her carotid stenosis.

## 2011-07-24 NOTE — Progress Notes (Signed)
Addended by: Merri Ray A on: 07/24/2011 04:48 PM   Modules accepted: Orders

## 2011-08-01 NOTE — Procedures (Unsigned)
CAROTID DUPLEX EXAM  INDICATION:  Follow up carotid disease.  HISTORY: Diabetes:  Yes. Cardiac:  Yes. Hypertension:  Yes. Smoking:  No. Previous Surgery: CV History: Amaurosis Fugax No, Paresthesias No, Hemiparesis No.                                      RIGHT             LEFT Brachial systolic pressure:         138               126 Brachial Doppler waveforms:         WNL               WNL Vertebral direction of flow:        Antegrade         Elevated (144 cm/s) DUPLEX VELOCITIES (cm/sec) CCA peak systolic                   61                154 ECA peak systolic                   400               132 ICA peak systolic                   250 (mid)         182 (mid) ICA end diastolic                   96 (mid)          58 (mid) PLAQUE MORPHOLOGY:                  Calcific          Calcific PLAQUE AMOUNT: PLAQUE LOCATION:                    CCA, ECA, ICA     CCA, ECA, ICA  IMPRESSION: 1. Unable to accurately evaluate the bilateral internal carotid     arteries due to dense calcific plaque. 2. Concern for severe stenosis on the right due to significant     spectral broadening of the waveforms in the mid internal carotid     artery. 3. Abnormal right external carotid artery; however, bilateral external     carotid arteries are difficult to visualize. 4. Moderate diffuse calcific disease in the left common carotid     artery. 5. Left vertebral artery is elevated and possibly compensatory. 6. Right vertebral artery is antegrade.  ___________________________________________ Larina Earthly, M.D.  LT/MEDQ  D:  07/24/2011  T:  07/24/2011  Job:  [id number]

## 2011-09-10 ENCOUNTER — Encounter: Payer: Self-pay | Admitting: Gastroenterology

## 2011-09-12 ENCOUNTER — Other Ambulatory Visit: Payer: Self-pay | Admitting: Family Medicine

## 2011-09-12 DIAGNOSIS — Z1231 Encounter for screening mammogram for malignant neoplasm of breast: Secondary | ICD-10-CM

## 2011-10-01 ENCOUNTER — Ambulatory Visit: Payer: Medicare Other | Admitting: Gastroenterology

## 2011-10-15 ENCOUNTER — Ambulatory Visit: Payer: Medicare Other | Admitting: Gastroenterology

## 2011-10-17 ENCOUNTER — Ambulatory Visit
Admission: RE | Admit: 2011-10-17 | Discharge: 2011-10-17 | Disposition: A | Discharge status: Home / Self Care | Payer: Medicare Other | Source: Ambulatory Visit | Attending: Family Medicine | Admitting: Family Medicine

## 2011-10-17 DIAGNOSIS — Z1231 Encounter for screening mammogram for malignant neoplasm of breast: Secondary | ICD-10-CM

## 2011-11-07 ENCOUNTER — Ambulatory Visit: Payer: Medicare Other | Admitting: Gastroenterology

## 2012-01-23 ENCOUNTER — Other Ambulatory Visit (INDEPENDENT_AMBULATORY_CARE_PROVIDER_SITE_OTHER): Payer: Medicare Other | Admitting: *Deleted

## 2012-01-23 DIAGNOSIS — I6529 Occlusion and stenosis of unspecified carotid artery: Secondary | ICD-10-CM

## 2012-01-28 ENCOUNTER — Encounter: Payer: Self-pay | Admitting: Vascular Surgery

## 2012-01-29 ENCOUNTER — Encounter: Payer: Self-pay | Admitting: Vascular Surgery

## 2012-01-29 ENCOUNTER — Ambulatory Visit (INDEPENDENT_AMBULATORY_CARE_PROVIDER_SITE_OTHER): Payer: Medicare Other | Admitting: Vascular Surgery

## 2012-01-29 VITALS — BP 127/69 | HR 71 | Temp 98.2°F | Ht 63.0 in | Wt 185.0 lb

## 2012-01-29 DIAGNOSIS — I6529 Occlusion and stenosis of unspecified carotid artery: Secondary | ICD-10-CM

## 2012-01-29 NOTE — Progress Notes (Signed)
The patient presents today for followup of extracranial cerebrovascular occlusive disease. She denies any amaurosis fugax transient ischemic attack or stroke. She has been in her normal usual health with no new cardiac difficulties.  Past Medical History  Diagnosis Date  . Diabetes mellitus   . Hyperlipidemia   . Hypertension   . CHF (congestive heart failure)   . Arthritis   . Anemia   . Carotid artery occlusion   . Coronary atherosclerosis   . Ischemic cardiomyopathy   . Bipolar affective disorder   . GERD (gastroesophageal reflux disease)   . Obesity   . Peripheral neuropathy   . Depression     History  Substance Use Topics  . Smoking status: Never Smoker   . Smokeless tobacco: Never Used  . Alcohol Use: No    Family History  Problem Relation Age of Onset  . Heart disease Brother   . Glaucoma Father   . Stroke Paternal Grandmother   . Coronary artery disease Paternal Grandmother   . Cerebral aneurysm Brother   . Clotting disorder Sister     Allergies  Allergen Reactions  . Penicillins Rash  . Protamine     Unknown reaction    Current outpatient prescriptions:amLODipine (NORVASC) 5 MG tablet, Take 5 mg by mouth daily.  , Disp: , Rfl: ;  aspirin 325 MG tablet, Take 325 mg by mouth daily.  , Disp: , Rfl: ;  carvedilol (COREG) 25 MG tablet, Take 25 mg by mouth 2 (two) times daily with a meal.  , Disp: , Rfl: ;  clopidogrel (PLAVIX) 75 MG tablet, Take 75 mg by mouth daily.  , Disp: , Rfl: ;  ezetimibe (ZETIA) 10 MG tablet, Take 10 mg by mouth daily.  , Disp: , Rfl:  ferrous sulfate 325 (65 FE) MG tablet, Take 325 mg by mouth daily with breakfast.  , Disp: , Rfl: ;  furosemide (LASIX) 40 MG tablet, Take 40 mg by mouth 2 (two) times daily.  , Disp: , Rfl: ;  glipiZIDE (GLUCOTROL) 10 MG tablet, Take 10 mg by mouth daily.  , Disp: , Rfl: ;  irbesartan (AVAPRO) 150 MG tablet, Take 150 mg by mouth at bedtime. , Disp: , Rfl:  isosorbide mononitrate (IMDUR) 30 MG 24 hr tablet,  Take 30 mg by mouth daily.  , Disp: , Rfl: ;  lansoprazole (PREVACID) 15 MG capsule, Take 15 mg by mouth daily.  , Disp: , Rfl: ;  mometasone (NASONEX) 50 MCG/ACT nasal spray, Place 2 sprays into the nose daily.  , Disp: , Rfl: ;  Multiple Vitamins-Minerals (MULTIVITAL) tablet, Take 1 tablet by mouth daily.  , Disp: , Rfl:  niacin (NIASPAN) 1000 MG CR tablet, Take 1,000 mg by mouth at bedtime.  , Disp: , Rfl: ;  rosuvastatin (CRESTOR) 40 MG tablet, Take 40 mg by mouth daily.  , Disp: , Rfl: ;  glucagon (GLUCAGEN) 1 MG SOLR, Inject into the vein once as needed.  , Disp: , Rfl:   BP 127/69  Pulse 71  Temp 98.2 F (36.8 C)  Ht 5\' 3"  (1.6 m)  Wt 185 lb (83.915 kg)  BMI 32.77 kg/m2  Body mass index is 32.77 kg/(m^2).       Review of systems: No change  Physical exam: Well-developed well-nourished black female in no acute distress HEENT normal Neurologic grossly intact Carotid arteries with bilateral bruits Heart regular rate and rhythm without murmur Pulse status 2+ radial pulses bilaterally  Carotid duplex: Shows progression to  greater than 80% stenosis on the right internal carotid and upper end of 60-79% on the left carotid   Impression and plan: Progression of carotid stenosis. I have recommended CT angiogram of her carotid arteries for better visualization. That she may need endarterectomy for reduction of stroke risk. We will discuss this further after her CT scan

## 2012-01-29 NOTE — Progress Notes (Signed)
Addended by: Sharee Pimple on: 01/29/2012 10:35 AM   Modules accepted: Orders

## 2012-01-30 NOTE — Procedures (Unsigned)
CAROTID DUPLEX EXAM  INDICATION:  Carotid stenosis.  HISTORY: Diabetes:  Yes Cardiac:  Yes Hypertension:  Yes Smoking:  No Previous Surgery:  No CV History:  Currently asymptomatic Amaurosis Fugax No, Paresthesias No, Hemiparesis No                                      RIGHT             LEFT Brachial systolic pressure:         133               129 Brachial Doppler waveforms:         Normal            Normal Vertebral direction of flow:        Antegrade         Antegrade DUPLEX VELOCITIES (cm/sec) CCA peak systolic                   53                120 ECA peak systolic                   375               131 ICA peak systolic                   344 mid           223 mid ICA end diastolic                   117 mid           101 mid PLAQUE MORPHOLOGY:                  Calcific          Calcific PLAQUE AMOUNT:                      Severe            Moderate - severe PLAQUE LOCATION:                    CCA, ICA, ECA     CCA, ICA, ECA  IMPRESSION: 1. Right ICA velocity suggests 80 to 99% stenosis. 2. Right ECA stenosis. 3. Left ICA velocity suggests 60 to 79% stenosis. 4. Antegrade vertebral arteries bilaterally. 5. Results were discussed with Dr. Edilia Bo today, who recommended the     patient to come back within 2 weeks to see Dr. Arbie Cookey.  ___________________________________________ Larina Earthly, M.D.  EM/MEDQ  D:  01/23/2012  T:  01/23/2012  Job:  [id number]

## 2012-02-01 ENCOUNTER — Other Ambulatory Visit: Payer: Self-pay | Admitting: Vascular Surgery

## 2012-02-04 ENCOUNTER — Encounter: Payer: Self-pay | Admitting: Vascular Surgery

## 2012-02-05 ENCOUNTER — Encounter (HOSPITAL_COMMUNITY): Payer: Self-pay | Admitting: Pharmacy Technician

## 2012-02-05 ENCOUNTER — Ambulatory Visit
Admission: RE | Admit: 2012-02-05 | Discharge: 2012-02-05 | Disposition: A | Discharge status: Home / Self Care | Payer: Medicare Other | Source: Ambulatory Visit | Attending: Vascular Surgery | Admitting: Vascular Surgery

## 2012-02-05 ENCOUNTER — Ambulatory Visit (INDEPENDENT_AMBULATORY_CARE_PROVIDER_SITE_OTHER): Payer: Medicare Other | Admitting: Vascular Surgery

## 2012-02-05 ENCOUNTER — Encounter: Payer: Self-pay | Admitting: Vascular Surgery

## 2012-02-05 ENCOUNTER — Other Ambulatory Visit: Payer: Self-pay | Admitting: *Deleted

## 2012-02-05 VITALS — BP 139/58 | HR 70 | Temp 98.5°F | Resp 18 | Ht 63.0 in | Wt 183.0 lb

## 2012-02-05 DIAGNOSIS — I6529 Occlusion and stenosis of unspecified carotid artery: Secondary | ICD-10-CM

## 2012-02-05 MED ORDER — IOHEXOL 350 MG/ML SOLN
100.0000 mL | Freq: Once | INTRAVENOUS | Status: AC | PRN
  Administered 2012-02-05: 100 mL via INTRAVENOUS

## 2012-02-05 NOTE — Progress Notes (Signed)
The patient is here today for continued discussion of her asymptomatic carotid disease. I had seen her one week ago with a carotid duplex showing progression to greater than 80% right carotid stenosis in the 60-80% left carotid stenosis. Portion she remains asymptomatic. She did have extensive plaque and some tortuosity and therefore I recommended CT angiogram for further evaluation. She underwent this today.  Past Medical History  Diagnosis Date  . Diabetes mellitus   . Hyperlipidemia   . Hypertension   . CHF (congestive heart failure)   . Arthritis   . Anemia   . Carotid artery occlusion   . Coronary atherosclerosis   . Ischemic cardiomyopathy   . Bipolar affective disorder   . GERD (gastroesophageal reflux disease)   . Obesity   . Peripheral neuropathy   . Depression     History  Substance Use Topics  . Smoking status: Never Smoker   . Smokeless tobacco: Never Used  . Alcohol Use: No    Family History  Problem Relation Age of Onset  . Heart disease Brother   . Glaucoma Father   . Stroke Paternal Grandmother   . Coronary artery disease Paternal Grandmother   . Cerebral aneurysm Brother   . Clotting disorder Sister     Allergies  Allergen Reactions  . Penicillins Rash  . Protamine     Unknown reaction    Current outpatient prescriptions:amLODipine (NORVASC) 5 MG tablet, Take 5 mg by mouth daily.  , Disp: , Rfl: ;  aspirin 325 MG tablet, Take 325 mg by mouth daily.  , Disp: , Rfl: ;  carvedilol (COREG) 25 MG tablet, Take 25 mg by mouth 2 (two) times daily with a meal.  , Disp: , Rfl: ;  clopidogrel (PLAVIX) 75 MG tablet, Take 75 mg by mouth daily.  , Disp: , Rfl: ;  ezetimibe (ZETIA) 10 MG tablet, Take 10 mg by mouth daily.  , Disp: , Rfl:  ferrous sulfate 325 (65 FE) MG tablet, Take 325 mg by mouth daily with breakfast.  , Disp: , Rfl: ;  furosemide (LASIX) 40 MG tablet, Take 40 mg by mouth 2 (two) times daily.  , Disp: , Rfl: ;  glipiZIDE (GLUCOTROL) 10 MG tablet, Take  10 mg by mouth daily.  , Disp: , Rfl: ;  irbesartan (AVAPRO) 150 MG tablet, Take 150 mg by mouth at bedtime. , Disp: , Rfl:  isosorbide mononitrate (IMDUR) 30 MG 24 hr tablet, Take 30 mg by mouth daily.  , Disp: , Rfl: ;  lansoprazole (PREVACID) 15 MG capsule, Take 15 mg by mouth daily.  , Disp: , Rfl: ;  mometasone (NASONEX) 50 MCG/ACT nasal spray, Place 2 sprays into the nose daily.  , Disp: , Rfl: ;  Multiple Vitamins-Minerals (MULTIVITAL) tablet, Take 1 tablet by mouth daily.  , Disp: , Rfl:  niacin (NIASPAN) 1000 MG CR tablet, Take 1,000 mg by mouth at bedtime.  , Disp: , Rfl: ;  rosuvastatin (CRESTOR) 40 MG tablet, Take 40 mg by mouth daily.  , Disp: , Rfl: ;  glucagon (GLUCAGEN) 1 MG SOLR, Inject into the vein once as needed.  , Disp: , Rfl:  No current facility-administered medications for this visit. Facility-Administered Medications Ordered in Other Visits: iohexol (OMNIPAQUE) 350 MG/ML injection 100 mL, 100 mL, Intravenous, Once PRN, Medication Radiologist, MD, 100 mL at 02/05/12 0919  BP 139/58  Pulse 70  Temp 98.5 F (36.9 C)  Resp 18  Ht 5\' 3"  (1.6 m)  Wt  183 lb (83.008 kg)  BMI 32.42 kg/m2  Body mass index is 32.42 kg/(m^2).       Review of systems no change  Physical exam: Well-developed well-nourished black female in no acute distress. Lastly grossly intact. Carotid arteries with a soft right carotid bruit no bruit on the left. Plus radial pulses bilaterally.  CT angiogram revealed severe greater than 80% right internal carotid artery stenosis at the bifurcation moderate approximately 60% stenosis the left internal carotid artery at the bifurcation  Impression and plan critical stenosis in right internal carotid artery. I have discussed options with Ms. Foulk. I have recommended endarterectomy for reduction of stroke risk. I did explain 1-2% risk of stroke with surgery and other potential postoperative complications. The patient understands and wished to proceed. We  have scheduled this for admission day surgery on 02/18/2012. She will stop her Plavix proximally one week prior to the procedure

## 2012-02-12 ENCOUNTER — Encounter (HOSPITAL_COMMUNITY)
Admission: RE | Admit: 2012-02-12 | Discharge: 2012-02-12 | Disposition: A | Discharge status: Home / Self Care | Payer: Medicare Other | Source: Ambulatory Visit | Attending: Vascular Surgery | Admitting: Vascular Surgery

## 2012-02-12 ENCOUNTER — Encounter (HOSPITAL_COMMUNITY): Payer: Self-pay

## 2012-02-12 ENCOUNTER — Encounter (HOSPITAL_COMMUNITY)
Admission: RE | Admit: 2012-02-12 | Discharge: 2012-02-12 | Disposition: A | Discharge status: Home / Self Care | Payer: Medicare Other | Source: Ambulatory Visit | Attending: Anesthesiology | Admitting: Anesthesiology

## 2012-02-12 LAB — COMPREHENSIVE METABOLIC PANEL
Albumin: 4 g/dL (ref 3.5–5.2)
Alkaline Phosphatase: 64 U/L (ref 39–117)
BUN: 15 mg/dL (ref 6–23)
CO2: 28 mEq/L (ref 19–32)
Chloride: 104 mEq/L (ref 96–112)
Creatinine, Ser: 0.84 mg/dL (ref 0.50–1.10)
GFR calc Af Amer: 84 mL/min — ABNORMAL LOW (ref >90)
GFR calc non Af Amer: 72 mL/min — ABNORMAL LOW (ref >90)
Glucose, Bld: 92 mg/dL (ref 70–99)
Potassium: 3.9 mEq/L (ref 3.5–5.1)
Total Bilirubin: 0.4 mg/dL (ref 0.3–1.2)

## 2012-02-12 LAB — CBC
HCT: 34.6 % — ABNORMAL LOW (ref 36.0–46.0)
Hemoglobin: 12 g/dL (ref 12.0–15.0)
MCV: 84.8 fL (ref 78.0–100.0)
WBC: 5.6 10*3/uL (ref 4.0–10.5)

## 2012-02-12 LAB — URINE MICROSCOPIC-ADD ON

## 2012-02-12 LAB — DIFFERENTIAL
Basophils Absolute: 0 10*3/uL (ref 0.0–0.1)
Basophils Relative: 0 % (ref 0–1)
Eosinophils Absolute: 0.2 10*3/uL (ref 0.0–0.7)
Monocytes Relative: 9 % (ref 3–12)
Neutrophils Relative %: 55 % (ref 43–77)

## 2012-02-12 LAB — APTT: aPTT: 28 seconds (ref 24–37)

## 2012-02-12 LAB — PROTIME-INR: INR: 1.04 (ref 0.00–1.49)

## 2012-02-12 LAB — URINALYSIS, ROUTINE W REFLEX MICROSCOPIC
Bilirubin Urine: NEGATIVE
Hgb urine dipstick: NEGATIVE
Ketones, ur: NEGATIVE mg/dL
Specific Gravity, Urine: 1.009 (ref 1.005–1.030)
Urobilinogen, UA: 0.2 mg/dL (ref 0.0–1.0)

## 2012-02-12 NOTE — Pre-Procedure Instructions (Signed)
18 Penny James  02/12/2012   Your procedure is scheduled on:  Monday February 18, 2012  Report to [facility] at 0530 AM.  Call this number if you have problems the morning of surgery: [phone]   Remember:   Do not eat food:After Midnight.  May have clear liquids: up to 4 Hours before arrival. (up to 1:30am)  Clear liquids include soda, tea, black coffee, apple or grape juice, broth.  Take these medicines the morning of surgery with A SIP OF WATER: amlodipine, coreg, isosorbide, prevacid, nasonex   Do not wear jewelry, make-up or nail polish.  Do not wear lotions, powders, or perfumes. You may wear deodorant.  Do not shave 48 hours prior to surgery.  Do not bring valuables to the hospital.  Contacts, dentures or bridgework may not be worn into surgery.  Leave suitcase in the car. After surgery it may be brought to your room.  For patients admitted to the hospital, checkout time is 11:00 AM the day of discharge.   Patients discharged the day of surgery will not be allowed to drive home.  Name and phone number of your driver: NA  Special Instructions: CHG Shower Use Special Wash: 1/2 bottle night before surgery and 1/2 bottle morning of surgery.   Please read over the following fact sheets that you were given: Pain Booklet, Coughing and Deep Breathing, Blood Transfusion Information, MRSA Information and Surgical Site Infection Prevention

## 2012-02-13 LAB — TYPE AND SCREEN: Antibody Screen: NEGATIVE

## 2012-02-17 MED ORDER — VANCOMYCIN HCL IN DEXTROSE 1-5 GM/200ML-% IV SOLN
1000.0000 mg | INTRAVENOUS | Status: AC
  Administered 2012-02-18: 1000 mg via INTRAVENOUS
  Filled 2012-02-17: qty 200

## 2012-02-18 ENCOUNTER — Inpatient Hospital Stay (HOSPITAL_COMMUNITY)
Admission: RE | Admit: 2012-02-18 | Discharge: 2012-02-21 | DRG: 038 | Disposition: A | Discharge status: Home / Self Care | Payer: Medicare Other | Source: Ambulatory Visit | Attending: Vascular Surgery | Admitting: Vascular Surgery

## 2012-02-18 ENCOUNTER — Ambulatory Visit (HOSPITAL_COMMUNITY): Payer: Medicare Other | Admitting: Anesthesiology

## 2012-02-18 ENCOUNTER — Encounter (HOSPITAL_COMMUNITY): Payer: Self-pay

## 2012-02-18 ENCOUNTER — Encounter (HOSPITAL_COMMUNITY): Payer: Self-pay | Admitting: *Deleted

## 2012-02-18 ENCOUNTER — Encounter (HOSPITAL_COMMUNITY)
Admission: RE | Disposition: A | Discharge status: Home / Self Care | Payer: Self-pay | Source: Ambulatory Visit | Attending: Vascular Surgery

## 2012-02-18 ENCOUNTER — Encounter (HOSPITAL_COMMUNITY): Payer: Self-pay | Admitting: Anesthesiology

## 2012-02-18 DIAGNOSIS — Z01812 Encounter for preprocedural laboratory examination: Secondary | ICD-10-CM

## 2012-02-18 DIAGNOSIS — M129 Arthropathy, unspecified: Secondary | ICD-10-CM | POA: Diagnosis present

## 2012-02-18 DIAGNOSIS — Z79899 Other long term (current) drug therapy: Secondary | ICD-10-CM

## 2012-02-18 DIAGNOSIS — Z01818 Encounter for other preprocedural examination: Secondary | ICD-10-CM

## 2012-02-18 DIAGNOSIS — I658 Occlusion and stenosis of other precerebral arteries: Secondary | ICD-10-CM | POA: Diagnosis present

## 2012-02-18 DIAGNOSIS — Y832 Surgical operation with anastomosis, bypass or graft as the cause of abnormal reaction of the patient, or of later complication, without mention of misadventure at the time of the procedure: Secondary | ICD-10-CM | POA: Diagnosis not present

## 2012-02-18 DIAGNOSIS — Z7982 Long term (current) use of aspirin: Secondary | ICD-10-CM

## 2012-02-18 DIAGNOSIS — Z7902 Long term (current) use of antithrombotics/antiplatelets: Secondary | ICD-10-CM

## 2012-02-18 DIAGNOSIS — E785 Hyperlipidemia, unspecified: Secondary | ICD-10-CM | POA: Diagnosis present

## 2012-02-18 DIAGNOSIS — I2510 Atherosclerotic heart disease of native coronary artery without angina pectoris: Secondary | ICD-10-CM | POA: Diagnosis present

## 2012-02-18 DIAGNOSIS — I6529 Occlusion and stenosis of unspecified carotid artery: Secondary | ICD-10-CM

## 2012-02-18 DIAGNOSIS — I2589 Other forms of chronic ischemic heart disease: Secondary | ICD-10-CM | POA: Diagnosis present

## 2012-02-18 DIAGNOSIS — D649 Anemia, unspecified: Secondary | ICD-10-CM | POA: Diagnosis present

## 2012-02-18 DIAGNOSIS — F319 Bipolar disorder, unspecified: Secondary | ICD-10-CM | POA: Diagnosis present

## 2012-02-18 DIAGNOSIS — E119 Type 2 diabetes mellitus without complications: Secondary | ICD-10-CM | POA: Diagnosis present

## 2012-02-18 DIAGNOSIS — Q278 Other specified congenital malformations of peripheral vascular system: Secondary | ICD-10-CM

## 2012-02-18 DIAGNOSIS — I509 Heart failure, unspecified: Secondary | ICD-10-CM | POA: Diagnosis present

## 2012-02-18 DIAGNOSIS — IMO0002 Reserved for concepts with insufficient information to code with codable children: Secondary | ICD-10-CM | POA: Diagnosis not present

## 2012-02-18 DIAGNOSIS — G609 Hereditary and idiopathic neuropathy, unspecified: Secondary | ICD-10-CM | POA: Diagnosis present

## 2012-02-18 DIAGNOSIS — I10 Essential (primary) hypertension: Secondary | ICD-10-CM | POA: Diagnosis present

## 2012-02-18 DIAGNOSIS — E669 Obesity, unspecified: Secondary | ICD-10-CM | POA: Diagnosis present

## 2012-02-18 DIAGNOSIS — K219 Gastro-esophageal reflux disease without esophagitis: Secondary | ICD-10-CM | POA: Diagnosis present

## 2012-02-18 HISTORY — PX: ENDARTERECTOMY: SHX5162

## 2012-02-18 LAB — GLUCOSE, CAPILLARY
Glucose-Capillary: 171 mg/dL — ABNORMAL HIGH (ref 70–99)
Glucose-Capillary: 188 mg/dL — ABNORMAL HIGH (ref 70–99)

## 2012-02-18 LAB — CBC
HCT: 29.1 % — ABNORMAL LOW (ref 36.0–46.0)
Hemoglobin: 10.1 g/dL — ABNORMAL LOW (ref 12.0–15.0)
RBC: 3.42 MIL/uL — ABNORMAL LOW (ref 3.87–5.11)
WBC: 11.1 10*3/uL — ABNORMAL HIGH (ref 4.0–10.5)

## 2012-02-18 SURGERY — ENDARTERECTOMY, CAROTID
Anesthesia: General | Site: Neck | Laterality: Right | Wound class: Clean

## 2012-02-18 MED ORDER — ONDANSETRON HCL 4 MG/2ML IJ SOLN
INTRAMUSCULAR | Status: DC | PRN
  Administered 2012-02-18: 4 mg via INTRAVENOUS

## 2012-02-18 MED ORDER — ASPIRIN EC 325 MG PO TBEC
325.0000 mg | DELAYED_RELEASE_TABLET | Freq: Every day | ORAL | Status: DC
  Administered 2012-02-21: 325 mg via ORAL
  Filled 2012-02-18 (×2): qty 1

## 2012-02-18 MED ORDER — DOPAMINE-DEXTROSE 3.2-5 MG/ML-% IV SOLN: 3.0000 ug/kg/min | INTRAVENOUS | Status: DC

## 2012-02-18 MED ORDER — MIDAZOLAM HCL 2 MG/2ML IJ SOLN: 1.0000 mg | INTRAMUSCULAR | Status: DC | PRN

## 2012-02-18 MED ORDER — FUROSEMIDE 40 MG PO TABS
40.0000 mg | ORAL_TABLET | Freq: Every day | ORAL | Status: DC
  Administered 2012-02-21: 40 mg via ORAL
  Filled 2012-02-18 (×3): qty 1

## 2012-02-18 MED ORDER — ISOSORBIDE MONONITRATE ER 30 MG PO TB24
30.0000 mg | ORAL_TABLET | Freq: Every day | ORAL | Status: DC
  Administered 2012-02-21: 30 mg via ORAL
  Filled 2012-02-18 (×3): qty 1

## 2012-02-18 MED ORDER — MORPHINE SULFATE 2 MG/ML IJ SOLN
2.0000 mg | INTRAMUSCULAR | Status: DC | PRN
  Administered 2012-02-18 – 2012-02-19 (×3): 4 mg via INTRAVENOUS
  Filled 2012-02-18 (×3): qty 2

## 2012-02-18 MED ORDER — ALUM & MAG HYDROXIDE-SIMETH 200-200-20 MG/5ML PO SUSP: 15.0000 mL | ORAL | Status: DC | PRN

## 2012-02-18 MED ORDER — DOCUSATE SODIUM 100 MG PO CAPS
100.0000 mg | ORAL_CAPSULE | Freq: Every day | ORAL | Status: DC
  Administered 2012-02-19 – 2012-02-21 (×3): 100 mg via ORAL
  Filled 2012-02-18 (×4): qty 1

## 2012-02-18 MED ORDER — CARVEDILOL 25 MG PO TABS
25.0000 mg | ORAL_TABLET | Freq: Two times a day (BID) | ORAL | Status: DC
  Administered 2012-02-21: 25 mg via ORAL
  Filled 2012-02-18 (×8): qty 1

## 2012-02-18 MED ORDER — SODIUM CHLORIDE 0.9 % IR SOLN
Status: DC | PRN
  Administered 2012-02-18: 09:00:00

## 2012-02-18 MED ORDER — SODIUM CHLORIDE 0.9 % IV SOLN: INTRAVENOUS | Status: DC

## 2012-02-18 MED ORDER — ONDANSETRON HCL 4 MG/2ML IJ SOLN: 4.0000 mg | Freq: Four times a day (QID) | INTRAMUSCULAR | Status: DC | PRN

## 2012-02-18 MED ORDER — LABETALOL HCL 5 MG/ML IV SOLN
10.0000 mg | INTRAVENOUS | Status: DC | PRN
  Filled 2012-02-18: qty 4

## 2012-02-18 MED ORDER — GUAIFENESIN-DM 100-10 MG/5ML PO SYRP: 15.0000 mL | ORAL_SOLUTION | ORAL | Status: DC | PRN

## 2012-02-18 MED ORDER — VANCOMYCIN HCL IN DEXTROSE 1-5 GM/200ML-% IV SOLN
INTRAVENOUS | Status: AC
  Filled 2012-02-18: qty 200

## 2012-02-18 MED ORDER — LORAZEPAM 2 MG/ML IJ SOLN: 1.0000 mg | Freq: Once | INTRAMUSCULAR | Status: DC | PRN

## 2012-02-18 MED ORDER — ASPIRIN 325 MG PO TABS: 325.0000 mg | ORAL_TABLET | Freq: Every day | ORAL | Status: DC

## 2012-02-18 MED ORDER — ROCURONIUM BROMIDE 100 MG/10ML IV SOLN
INTRAVENOUS | Status: DC | PRN
  Administered 2012-02-18: 50 mg via INTRAVENOUS
  Administered 2012-02-18: 10 mg via INTRAVENOUS

## 2012-02-18 MED ORDER — FENTANYL CITRATE 0.05 MG/ML IJ SOLN: 50.0000 ug | INTRAMUSCULAR | Status: DC | PRN

## 2012-02-18 MED ORDER — LACTATED RINGERS IV SOLN
INTRAVENOUS | Status: DC | PRN
  Administered 2012-02-18: 07:00:00 via INTRAVENOUS

## 2012-02-18 MED ORDER — HYDRALAZINE HCL 20 MG/ML IJ SOLN
10.0000 mg | INTRAMUSCULAR | Status: DC | PRN
  Filled 2012-02-18: qty 0.5

## 2012-02-18 MED ORDER — POTASSIUM CHLORIDE CRYS ER 20 MEQ PO TBCR: 20.0000 meq | EXTENDED_RELEASE_TABLET | Freq: Once | ORAL | Status: AC | PRN

## 2012-02-18 MED ORDER — IRBESARTAN 150 MG PO TABS
150.0000 mg | ORAL_TABLET | Freq: Every morning | ORAL | Status: DC
  Administered 2012-02-21: 150 mg via ORAL
  Filled 2012-02-18 (×3): qty 1

## 2012-02-18 MED ORDER — GLYCOPYRROLATE 0.2 MG/ML IJ SOLN
INTRAMUSCULAR | Status: DC | PRN
  Administered 2012-02-18: 0.6 mg via INTRAVENOUS

## 2012-02-18 MED ORDER — FERROUS SULFATE 325 (65 FE) MG PO TABS
325.0000 mg | ORAL_TABLET | Freq: Every day | ORAL | Status: DC
  Administered 2012-02-19 – 2012-02-21 (×3): 325 mg via ORAL
  Filled 2012-02-18 (×4): qty 1

## 2012-02-18 MED ORDER — GLIPIZIDE 10 MG PO TABS
10.0000 mg | ORAL_TABLET | Freq: Every day | ORAL | Status: DC
  Administered 2012-02-21: 10 mg via ORAL
  Filled 2012-02-18 (×4): qty 1

## 2012-02-18 MED ORDER — INSULIN ASPART 100 UNIT/ML ~~LOC~~ SOLN
0.0000 [IU] | Freq: Three times a day (TID) | SUBCUTANEOUS | Status: DC
  Administered 2012-02-19 (×3): 3 [IU] via SUBCUTANEOUS
  Administered 2012-02-20: 2 [IU] via SUBCUTANEOUS
  Administered 2012-02-20: 5 [IU] via SUBCUTANEOUS

## 2012-02-18 MED ORDER — PANTOPRAZOLE SODIUM 20 MG PO TBEC
20.0000 mg | DELAYED_RELEASE_TABLET | Freq: Every day | ORAL | Status: DC
  Administered 2012-02-19 – 2012-02-21 (×3): 20 mg via ORAL
  Filled 2012-02-18 (×3): qty 1

## 2012-02-18 MED ORDER — AMLODIPINE BESYLATE 5 MG PO TABS
5.0000 mg | ORAL_TABLET | Freq: Every day | ORAL | Status: DC
  Administered 2012-02-21: 5 mg via ORAL
  Filled 2012-02-18 (×3): qty 1

## 2012-02-18 MED ORDER — HEPARIN SODIUM (PORCINE) 1000 UNIT/ML IJ SOLN
INTRAMUSCULAR | Status: DC | PRN
  Administered 2012-02-18: 8000 [IU] via INTRAVENOUS

## 2012-02-18 MED ORDER — EZETIMIBE 10 MG PO TABS
10.0000 mg | ORAL_TABLET | Freq: Every day | ORAL | Status: DC
  Filled 2012-02-18 (×3): qty 1

## 2012-02-18 MED ORDER — SODIUM CHLORIDE 0.9 % IV SOLN
10.0000 mg | INTRAVENOUS | Status: DC | PRN
  Administered 2012-02-18: 5 ug/min via INTRAVENOUS

## 2012-02-18 MED ORDER — HYDROMORPHONE HCL PF 1 MG/ML IJ SOLN
0.2500 mg | INTRAMUSCULAR | Status: DC | PRN
  Administered 2012-02-18: 0.5 mg via INTRAVENOUS

## 2012-02-18 MED ORDER — OXYCODONE-ACETAMINOPHEN 5-325 MG PO TABS
1.0000 | ORAL_TABLET | ORAL | Status: DC | PRN
  Administered 2012-02-20: 1 via ORAL
  Filled 2012-02-18: qty 2

## 2012-02-18 MED ORDER — PHENOL 1.4 % MT LIQD: 1.0000 | OROMUCOSAL | Status: DC | PRN

## 2012-02-18 MED ORDER — ACETAMINOPHEN 650 MG RE SUPP: 325.0000 mg | RECTAL | Status: DC | PRN

## 2012-02-18 MED ORDER — SODIUM CHLORIDE 0.9 % IV SOLN: 500.0000 mL | Freq: Once | INTRAVENOUS | Status: AC | PRN

## 2012-02-18 MED ORDER — PROPOFOL 10 MG/ML IV EMUL
INTRAVENOUS | Status: DC | PRN
  Administered 2012-02-18: 200 mg via INTRAVENOUS

## 2012-02-18 MED ORDER — FENTANYL CITRATE 0.05 MG/ML IJ SOLN
INTRAMUSCULAR | Status: DC | PRN
  Administered 2012-02-18 (×3): 50 ug via INTRAVENOUS
  Administered 2012-02-18: 100 ug via INTRAVENOUS

## 2012-02-18 MED ORDER — VANCOMYCIN HCL IN DEXTROSE 1-5 GM/200ML-% IV SOLN
1000.0000 mg | Freq: Two times a day (BID) | INTRAVENOUS | Status: AC
  Administered 2012-02-18 – 2012-02-19 (×2): 1000 mg via INTRAVENOUS
  Filled 2012-02-18 (×2): qty 200

## 2012-02-18 MED ORDER — ACETAMINOPHEN 325 MG PO TABS
325.0000 mg | ORAL_TABLET | ORAL | Status: DC | PRN
  Administered 2012-02-19 (×2): 650 mg via ORAL
  Filled 2012-02-18 (×2): qty 2

## 2012-02-18 MED ORDER — 0.9 % SODIUM CHLORIDE (POUR BTL) OPTIME
TOPICAL | Status: DC | PRN
  Administered 2012-02-18: 1000 mL

## 2012-02-18 MED ORDER — NEOSTIGMINE METHYLSULFATE 1 MG/ML IJ SOLN
INTRAMUSCULAR | Status: DC | PRN
  Administered 2012-02-18: 3 mg via INTRAVENOUS

## 2012-02-18 MED ORDER — LIDOCAINE HCL (CARDIAC) 20 MG/ML IV SOLN
INTRAVENOUS | Status: DC | PRN
  Administered 2012-02-18: 100 mg via INTRAVENOUS

## 2012-02-18 MED ORDER — METOPROLOL TARTRATE 1 MG/ML IV SOLN: 2.0000 mg | INTRAVENOUS | Status: DC | PRN

## 2012-02-18 MED ORDER — BISACODYL 10 MG RE SUPP: 10.0000 mg | Freq: Every day | RECTAL | Status: DC | PRN

## 2012-02-18 SURGICAL SUPPLY — 51 items
BENZOIN TINCTURE PRP APPL 2/3 (GAUZE/BANDAGES/DRESSINGS) ×2 IMPLANT
CANISTER SUCTION 2500CC (MISCELLANEOUS) ×2 IMPLANT
CATH ROBINSON RED A/P 18FR (CATHETERS) ×2 IMPLANT
CLIP LIGATING EXTRA MED SLVR (CLIP) ×2 IMPLANT
CLIP LIGATING EXTRA SM BLUE (MISCELLANEOUS) ×2 IMPLANT
CLOTH BEACON ORANGE TIMEOUT ST (SAFETY) ×2 IMPLANT
CLSR STERI-STRIP ANTIMIC 1/2X4 (GAUZE/BANDAGES/DRESSINGS) ×2 IMPLANT
COVER SURGICAL LIGHT HANDLE (MISCELLANEOUS) ×4 IMPLANT
CRADLE DONUT ADULT HEAD (MISCELLANEOUS) ×2 IMPLANT
DECANTER SPIKE VIAL GLASS SM (MISCELLANEOUS) IMPLANT
DRAIN CHANNEL 7F FF FLAT (WOUND CARE) ×2 IMPLANT
DRAIN HEMOVAC 1/8 X 5 (WOUND CARE) IMPLANT
DRAPE WARM FLUID 44X44 (DRAPE) ×2 IMPLANT
DRSG COVADERM 4X6 (GAUZE/BANDAGES/DRESSINGS) ×2 IMPLANT
ELECT REM PT RETURN 9FT ADLT (ELECTROSURGICAL) ×2
ELECTRODE REM PT RTRN 9FT ADLT (ELECTROSURGICAL) ×1 IMPLANT
EVACUATOR SILICONE 100CC (DRAIN) ×2 IMPLANT
GEL ULTRASOUND 20GR AQUASONIC (MISCELLANEOUS) IMPLANT
GLOVE BIO SURGEON STRL SZ 6.5 (GLOVE) ×2 IMPLANT
GLOVE BIO SURGEON STRL SZ7 (GLOVE) ×2 IMPLANT
GLOVE BIOGEL PI IND STRL 7.0 (GLOVE) ×3 IMPLANT
GLOVE BIOGEL PI INDICATOR 7.0 (GLOVE) ×3
GLOVE ECLIPSE 6.5 STRL STRAW (GLOVE) ×6 IMPLANT
GLOVE SS BIOGEL STRL SZ 7.5 (GLOVE) ×1 IMPLANT
GLOVE SUPERSENSE BIOGEL SZ 7.5 (GLOVE) ×1
GOWN PREVENTION PLUS XXLARGE (GOWN DISPOSABLE) ×4 IMPLANT
GOWN STRL NON-REIN LRG LVL3 (GOWN DISPOSABLE) ×6 IMPLANT
KIT BASIN OR (CUSTOM PROCEDURE TRAY) ×2 IMPLANT
KIT CATH SUCT 8FR (CATHETERS) ×2 IMPLANT
KIT ROOM TURNOVER OR (KITS) ×2 IMPLANT
NEEDLE 22X1 1/2 (OR ONLY) (NEEDLE) IMPLANT
NS IRRIG 1000ML POUR BTL (IV SOLUTION) ×4 IMPLANT
PACK CAROTID (CUSTOM PROCEDURE TRAY) ×2 IMPLANT
PAD ARMBOARD 7.5X6 YLW CONV (MISCELLANEOUS) ×4 IMPLANT
PATCH HEMASHIELD 8X75 (Vascular Products) ×2 IMPLANT
SHUNT CAROTID BYPASS 10 (VASCULAR PRODUCTS) IMPLANT
SHUNT CAROTID BYPASS 12FRX15.5 (VASCULAR PRODUCTS) IMPLANT
SPECIMEN JAR SMALL (MISCELLANEOUS) ×2 IMPLANT
STRIP CLOSURE SKIN 1/2X4 (GAUZE/BANDAGES/DRESSINGS) ×2 IMPLANT
SUT ETHILON 3 0 PS 1 (SUTURE) ×2 IMPLANT
SUT PROLENE 6 0 CC (SUTURE) ×12 IMPLANT
SUT SILK 3 0 (SUTURE) ×1
SUT SILK 3-0 18XBRD TIE 12 (SUTURE) ×1 IMPLANT
SUT VIC AB 3-0 SH 27 (SUTURE) ×2
SUT VIC AB 3-0 SH 27X BRD (SUTURE) ×2 IMPLANT
SUT VICRYL 4-0 PS2 18IN ABS (SUTURE) ×2 IMPLANT
SYR CONTROL 10ML LL (SYRINGE) IMPLANT
TOWEL OR 17X24 6PK STRL BLUE (TOWEL DISPOSABLE) ×2 IMPLANT
TOWEL OR 17X26 10 PK STRL BLUE (TOWEL DISPOSABLE) ×2 IMPLANT
TRAY FOLEY CATH 14FRSI W/METER (CATHETERS) ×2 IMPLANT
WATER STERILE IRR 1000ML POUR (IV SOLUTION) ×2 IMPLANT

## 2012-02-18 NOTE — Preoperative (Signed)
Beta Blockers   Reason not to administer Beta Blockers:Not Applicable 

## 2012-02-18 NOTE — Op Note (Signed)
OPERATIVE REPORT  DATE OF SURGERY: 02/18/2012  PATIENT: Penny James, 61 y.o. female MRN: [id number]  DOB: [birth year]  PRE-OPERATIVE DIAGNOSIS: Severe asymptomatic right internal carotid artery stenosis with significant ICA tortuosity  POST-OPERATIVE DIAGNOSIS:  Same. Greater than 90% internal carotid artery stenosis  PROCEDURE: Right carotid endarterectomy resection of redundant internal carotid artery and Dacron patch angioplasty  SURGEON:  Gretta Began, M.D.  PHYSICIAN ASSISTANT: Roczniak  ANESTHESIA:  Gen.  EBL: 325 ml  Total I/O In: 2100 [I.V.:2000; Other:100] Out: 755 [Urine:430; Blood:325]  BLOOD ADMINISTERED: None  DRAINS: Agudelo-Pratt drain right neck  SPECIMEN: Carotid plaque and redundant internal carotid artery  COUNTS CORRECT:  YES  PLAN OF CARE: PACU neurologically intact   PATIENT DISPOSITION:  PACU - hemodynamically stable  PROCEDURE DETAILS: The patient was taken to the upper and placed supine position where the area of the right neck were prepped and draped in usual sterile fashion. Incision was made anterior sternocleidomastoid and carried down to the platysma. The sternocleidomastoid reflected posteriorly. There was a great deal of inflammation with adherence of subcutaneous and surrounding tissues to the artery. As was all dissected. The facial vein had several branches and these were ligated with 3-0 silk ties and divided. The common carotid artery was encircled with an umbilical tape and Rummel tourniquet. The vagus nerve was identified and preserved. Dissection extended to the bifurcation. The patient did have a high bifurcation there was extensive tortuosity of the internal carotid artery. The external carotid was encircled with a blue vessel loop the superior thyroid artery was circled with a 2 silk Potts tie. The glossal nerve was identified. There was a branch off of the external carotid the hypoglossal nerve tethered over the bifurcation. This  branch was ligated and divided for better mobilization of the hypoglossal nerve. The internal carotid was encircled above the anal level of tortuosity. There was some poststenotic dilatation. The patient was given 8000 intravenous heparin after adequate circulation time the internal/external and common carotid arteries were occluded. The common carotid or 11 blade sludge in Potts scissors on to the internal carotid. An attempt was made to place a shunt. Due to the tortuosity this was not possible. The artery was opened further proximally and was still difficult to pace the shunt therefore decision was made to proceed without a shunt. The endarterectomy was begun on the common carotid artery the plaque was divided proximally with Potts scissors. The endarterectomy skin to the bifurcation in the external carotid was endarterectomized with an eversion technique and the internal carotid and an open fashion. Remaining atheromatous debris was removed from the endarterectomy plane. Due to the redundancy of the internal carotid artery decision was made to resect approximately 1 1/2 cm of the internal carotid artery. 6-0 Prolene stay sutures were used at the proximal and distal portion of the resection and the segment of carotid artery was resected. The internal carotid artery was then sewn end-to-end to itself with a running 6-0 Prolene suture. A Finesse Hemashield Dacron patch was brought onto the field and sent as a patch angioplasty with a running 6-0 Prolene suture. Prior to completion of the anastomosis the usual flushing maneuvers were undertaken. Anastomosis was then completed and flow was restored first to the external then the internal carotid arteries. Multiple different sutures were required for hemostasis around the patch angioplasty. The patient had a history of allergy to protamine therefore protamine was not given. A 7 fluted drain was brought into the field was brought to separate stab  incision the base of  the neck. The drain was cut to appropriate length was placed above the level of the patch. The drain was secured to the skin with a 3-0 nylon suture. The sternomastoid was then closed with several 3-0 Vicryl interrupted sutures to reapproximate this over the carotid sheath. The platysma was closed with a running 3-0 Vicryl suture and the skin was closed with 4 subcuticular Vicryl stitch. Benzoin and Steri-Strips were applied. The patient was taken to the recovery room in stable condition. She was awakened in the operating room neurologically intact.   Gretta Began, M.D. 02/18/2012 12:55 PM

## 2012-02-18 NOTE — Interval H&P Note (Signed)
History and Physical Interval Note:  02/18/2012 7:20 AM  Penny James  has presented today for surgery, with the diagnosis of Right internal carotid artery stenosis   The various methods of treatment have been discussed with the patient and family. After consideration of risks, benefits and other options for treatment, the patient has consented to  Procedure(s) (LRB): ENDARTERECTOMY CAROTID (Right) as a surgical intervention .  The patients' history has been reviewed, patient examined, no change in status, stable for surgery.  I have reviewed the patients' chart and labs.  Questions were answered to the patient's satisfaction.     Glenice Ciccone

## 2012-02-18 NOTE — Plan of Care (Signed)
Problem: Consults Goal: CEA/CES/AAA Patient Education (See Patient Education module for education specifics.) Outcome: Progressing Explained goals of care, and treatment plan  Goal: Diagnosis CEA/CES/AAA Stent Carotid Endarterectomy (CEA)  Problem: Phase I Progression Outcomes Goal: Vascular site scale level 0 - I Vascular Site Scale Level 0: No bruising/bleeding/hematoma Level I (Mild): Bruising/Ecchymosis, minimal bleeding/ooozing, palpable hematoma < 3 cm Level II (Moderate): Bleeding not affecting hemodynamic parameters, pseudoaneurysm, palpable hematoma > 3 cm Outcome: Not Progressing Neck with probale hematoma

## 2012-02-18 NOTE — Anesthesia Postprocedure Evaluation (Signed)
  Anesthesia Post-op Note  Patient: Penny James  Procedure(s) Performed: Procedure(s) (LRB): ENDARTERECTOMY CAROTID (Right)  Patient Location: PACU  Anesthesia Type: General  Level of Consciousness: awake  Airway and Oxygen Therapy: Patient Spontanous Breathing  Post-op Pain: mild  Post-op Assessment: Post-op Vital signs reviewed and Patient's Cardiovascular Status Stable  Post-op Vital Signs: stable  Complications: No apparent anesthesia complications

## 2012-02-18 NOTE — Progress Notes (Signed)
61 yo female s/p carotid endarterectomy will be put on vancomycin x 24hrs. SCr 0.84 on 02/12/12; Wt 82.6kg; CrCl ~57. Got one dose of vanco 1g iv x1 at 0755 today  Plan: 1) Vancomycin 1g iv q12h, next dose at 2000 tonight x 24 hours. 2) Pharmacy will sign off.

## 2012-02-18 NOTE — Progress Notes (Signed)
Pt arrived from PACU, VSS, JP drain emptied bloody drainage 40ml + and full of large clots, dsg leaking around site near ear. PACU RN stated MD aware of oozing. Pt states feels that it is harder to swallow, but can breathe okay and can communicate with staff. Notified MD of continued leaking and JP output, Rapid Response notified, due to MD in surgery at current time. Rapid Response RN at bedside. Will continue to monitor for further changes.

## 2012-02-18 NOTE — Progress Notes (Signed)
Changed JP bulb out due to large clot. Will continue to monitor. VSS

## 2012-02-18 NOTE — Transfer of Care (Signed)
Immediate Anesthesia Transfer of Care Note  Patient: Penny James  Procedure(s) Performed: Procedure(s) (LRB): ENDARTERECTOMY CAROTID (Right)  Patient Location: PACU  Anesthesia Type: General  Level of Consciousness: awake, alert  and oriented  Airway & Oxygen Therapy: Patient Spontanous Breathing and Patient connected to nasal cannula oxygen  Post-op Assessment: Report given to PACU RN, Post -op Vital signs reviewed and stable, Patient moving all extremities X 4 and Patient able to stick tongue midline  Post vital signs: Reviewed and stable  Complications: No apparent anesthesia complications

## 2012-02-18 NOTE — Anesthesia Preprocedure Evaluation (Addendum)
Anesthesia Evaluation  Patient identified by MRN, date of birth, ID band Patient awake    Reviewed: Allergy & Precautions, H&P , NPO status , Patient's Chart, lab work & pertinent test results  Airway Mallampati: I TM Distance: >3 FB Neck ROM: Full    Dental   Pulmonary    Pulmonary exam normal       Cardiovascular hypertension, + CAD and +CHF     Neuro/Psych Bipolar Disorder  Neuromuscular disease    GI/Hepatic GERD-  ,  Endo/Other  Diabetes mellitus-  Renal/GU      Musculoskeletal   Abdominal (+) + obese,   Peds  Hematology   Anesthesia Other Findings   Reproductive/Obstetrics                          Anesthesia Physical Anesthesia Plan  ASA: III  Anesthesia Plan: General   Post-op Pain Management:    Induction: Intravenous  Airway Management Planned: Oral ETT  Additional Equipment: Arterial line  Intra-op Plan:   Post-operative Plan: Extubation in OR  Informed Consent: I have reviewed the patients History and Physical, chart, labs and discussed the procedure including the risks, benefits and alternatives for the proposed anesthesia with the patient or authorized representative who has indicated his/her understanding and acceptance.     Plan Discussed with: CRNA and Surgeon  Anesthesia Plan Comments:         Anesthesia Quick Evaluation

## 2012-02-18 NOTE — Anesthesia Procedure Notes (Signed)
Procedure Name: Intubation Date/Time: 02/18/2012 7:49 AM Performed by: Quentin Ore Pre-anesthesia Checklist: Patient identified, Emergency Drugs available, Suction available, Patient being monitored and Timeout performed Patient Re-evaluated:Patient Re-evaluated prior to inductionOxygen Delivery Method: Circle system utilized Preoxygenation: Pre-oxygenation with 100% oxygen Intubation Type: IV induction Ventilation: Mask ventilation without difficulty Laryngoscope Size: Mac and 3 Grade View: Grade III Tube type: Oral Tube size: 7.0 mm Number of attempts: 1 Airway Equipment and Method: Stylet Placement Confirmation: positive ETCO2,  ETT inserted through vocal cords under direct vision and breath sounds checked- equal and bilateral Secured at: 21 cm Tube secured with: Tape Dental Injury: Teeth and Oropharynx as per pre-operative assessment  Comments: Pt. Has limited neck extension.

## 2012-02-18 NOTE — H&P (View-Only) (Signed)
The patient is here today for continued discussion of her asymptomatic carotid disease. I had seen her one week ago with a carotid duplex showing progression to greater than 80% right carotid stenosis in the 60-80% left carotid stenosis. Portion she remains asymptomatic. She did have extensive plaque and some tortuosity and therefore I recommended CT angiogram for further evaluation. She underwent this today.  Past Medical History  Diagnosis Date  . Diabetes mellitus   . Hyperlipidemia   . Hypertension   . CHF (congestive heart failure)   . Arthritis   . Anemia   . Carotid artery occlusion   . Coronary atherosclerosis   . Ischemic cardiomyopathy   . Bipolar affective disorder   . GERD (gastroesophageal reflux disease)   . Obesity   . Peripheral neuropathy   . Depression     History  Substance Use Topics  . Smoking status: Never Smoker   . Smokeless tobacco: Never Used  . Alcohol Use: No    Family History  Problem Relation Age of Onset  . Heart disease Brother   . Glaucoma Father   . Stroke Paternal Grandmother   . Coronary artery disease Paternal Grandmother   . Cerebral aneurysm Brother   . Clotting disorder Sister     Allergies  Allergen Reactions  . Penicillins Rash  . Protamine     Unknown reaction    Current outpatient prescriptions:amLODipine (NORVASC) 5 MG tablet, Take 5 mg by mouth daily.  , Disp: , Rfl: ;  aspirin 325 MG tablet, Take 325 mg by mouth daily.  , Disp: , Rfl: ;  carvedilol (COREG) 25 MG tablet, Take 25 mg by mouth 2 (two) times daily with a meal.  , Disp: , Rfl: ;  clopidogrel (PLAVIX) 75 MG tablet, Take 75 mg by mouth daily.  , Disp: , Rfl: ;  ezetimibe (ZETIA) 10 MG tablet, Take 10 mg by mouth daily.  , Disp: , Rfl:  ferrous sulfate 325 (65 FE) MG tablet, Take 325 mg by mouth daily with breakfast.  , Disp: , Rfl: ;  furosemide (LASIX) 40 MG tablet, Take 40 mg by mouth 2 (two) times daily.  , Disp: , Rfl: ;  glipiZIDE (GLUCOTROL) 10 MG tablet, Take  10 mg by mouth daily.  , Disp: , Rfl: ;  irbesartan (AVAPRO) 150 MG tablet, Take 150 mg by mouth at bedtime. , Disp: , Rfl:  isosorbide mononitrate (IMDUR) 30 MG 24 hr tablet, Take 30 mg by mouth daily.  , Disp: , Rfl: ;  lansoprazole (PREVACID) 15 MG capsule, Take 15 mg by mouth daily.  , Disp: , Rfl: ;  mometasone (NASONEX) 50 MCG/ACT nasal spray, Place 2 sprays into the nose daily.  , Disp: , Rfl: ;  Multiple Vitamins-Minerals (MULTIVITAL) tablet, Take 1 tablet by mouth daily.  , Disp: , Rfl:  niacin (NIASPAN) 1000 MG CR tablet, Take 1,000 mg by mouth at bedtime.  , Disp: , Rfl: ;  rosuvastatin (CRESTOR) 40 MG tablet, Take 40 mg by mouth daily.  , Disp: , Rfl: ;  glucagon (GLUCAGEN) 1 MG SOLR, Inject into the vein once as needed.  , Disp: , Rfl:  No current facility-administered medications for this visit. Facility-Administered Medications Ordered in Other Visits: iohexol (OMNIPAQUE) 350 MG/ML injection 100 mL, 100 mL, Intravenous, Once PRN, Medication Radiologist, MD, 100 mL at 02/05/12 0919  BP 139/58  Pulse 70  Temp 98.5 F (36.9 C)  Resp 18  Ht 5' 3" (1.6 m)  Wt   183 lb (83.008 kg)  BMI 32.42 kg/m2  Body mass index is 32.42 kg/(m^2).       Review of systems no change  Physical exam: Well-developed well-nourished black female in no acute distress. Lastly grossly intact. Carotid arteries with a soft right carotid bruit no bruit on the left. Plus radial pulses bilaterally.  CT angiogram revealed severe greater than 80% right internal carotid artery stenosis at the bifurcation moderate approximately 60% stenosis the left internal carotid artery at the bifurcation  Impression and plan critical stenosis in right internal carotid artery. I have discussed options with [name]. I have recommended endarterectomy for reduction of stroke risk. I did explain 1-2% risk of stroke with surgery and other potential postoperative complications. The patient understands and wished to proceed. We  have scheduled this for admission day surgery on 02/18/2012. She will stop her Plavix proximally one week prior to the procedure 

## 2012-02-18 NOTE — Significant Event (Signed)
Rapid Response Event Note  Overview:  called to patient's bedside because pt bleeding from CEA inc at 1510. Dressing saturated.  Upper inc oozy. Some swelling around inc, firm. Direct pressure held to upper inc. Pressure dsg applied.  Min bloody drainage.  No change in swelling. [facility] PA, and Dr Early at bedside to assess pt. RN to call if assistance needed.    Initial Focused Assessment:   Interventions:   Event Summary:   at      at          Penny James

## 2012-02-18 NOTE — Progress Notes (Signed)
The patient is neurologically intact in the step down unit. She has had approximately 200 cc total from her Macek-Pratt drain following surgery. She has a very short neck and does heavy therefore making assessment of her swelling difficulty. Her trachea appears to be midline and she is in no respiratory distress. We will continue observation. Discussed with the patient and her husband present at the bedside

## 2012-02-19 ENCOUNTER — Encounter (HOSPITAL_COMMUNITY): Payer: Self-pay | Admitting: Vascular Surgery

## 2012-02-19 LAB — CBC
HCT: 26.5 % — ABNORMAL LOW (ref 36.0–46.0)
Hemoglobin: 9.1 g/dL — ABNORMAL LOW (ref 12.0–15.0)
MCH: 28.9 pg (ref 26.0–34.0)
MCV: 84.1 fL (ref 78.0–100.0)
Platelets: 171 10*3/uL (ref 150–400)
RBC: 3.15 MIL/uL — ABNORMAL LOW (ref 3.87–5.11)
WBC: 9.4 10*3/uL (ref 4.0–10.5)

## 2012-02-19 LAB — GLUCOSE, CAPILLARY
Glucose-Capillary: 186 mg/dL — ABNORMAL HIGH (ref 70–99)
Glucose-Capillary: 182 mg/dL — ABNORMAL HIGH (ref 70–99)
Glucose-Capillary: 174 mg/dL — ABNORMAL HIGH (ref 70–99)
Glucose-Capillary: 137 mg/dL — ABNORMAL HIGH (ref 70–99)

## 2012-02-19 LAB — BASIC METABOLIC PANEL
BUN: 27 mg/dL — ABNORMAL HIGH (ref 6–23)
CO2: 24 mEq/L (ref 19–32)
Chloride: 104 mEq/L (ref 96–112)
Creatinine, Ser: 1.28 mg/dL — ABNORMAL HIGH (ref 0.50–1.10)
Glucose, Bld: 186 mg/dL — ABNORMAL HIGH (ref 70–99)

## 2012-02-19 MED ORDER — SODIUM CHLORIDE 0.9 % IV SOLN: INTRAVENOUS | Status: DC

## 2012-02-19 NOTE — Progress Notes (Signed)
UR COMPLETED  

## 2012-02-19 NOTE — Plan of Care (Signed)
Problem: Consults Goal: Diagnosis CEA/CES/AAA Stent Carotid Endarterectomy (CEA)     

## 2012-02-19 NOTE — Progress Notes (Signed)
Inpatient Diabetes Program Recommendations  AACE/ADA: New Consensus Statement on Inpatient Glycemic Control (2009)  Target Ranges:  Prepandial:   less than 140 mg/dL      Peak postprandial:   less than 180 mg/dL (1-2 hours)      Critically ill patients:  140 - 180 mg/dL   Reason for Visit: CBG's slightly greater than goal.  Please check A1C to determine pre-hospitalization glycemic control. Will follow.

## 2012-02-19 NOTE — Progress Notes (Addendum)
VASCULAR AND VEIN SURGERY POST - OP CEA PROGRESS NOTE  Date of Surgery: 02/18/2012 Surgeon: Moishe Spice): Larina Earthly, MD 1 Day Post-Op right Carotid Endarterectomy .  HPI: Penny James is a 61 y.o. female who is 1 Day Post-Op right Carotid Endarterectomy . Patient is doing well. Patient denies headache; Patient denies difficulty swallowing; denies weakness in upper or lower extremities; Pt. denies other symptoms of stroke or TIA.  IMAGING: No results found.  Significant Diagnostic Studies: CBC Lab Results  Component Value Date   WBC 9.4 02/19/2012   HGB 9.1* 02/19/2012   HCT 26.5* 02/19/2012   MCV 84.1 02/19/2012   PLT 171 02/19/2012    BMET    Component Value Date/Time   NA 139 02/19/2012 0355   K 4.5 02/19/2012 0355   CL 104 02/19/2012 0355   CO2 24 02/19/2012 0355   GLUCOSE 186* 02/19/2012 0355   BUN 27* 02/19/2012 0355   CREATININE 1.28* 02/19/2012 0355   CREATININE 0.91 02/01/2012 0709   CALCIUM 8.4 02/19/2012 0355   GFRNONAA 44* 02/19/2012 0355   GFRAA 50* 02/19/2012 0355    COAG Lab Results  Component Value Date   INR 1.04 02/12/2012   No results found for this basename: PTT      Intake/Output Summary (Last 24 hours) at 02/19/12 0735 Last data filed at 02/19/12 0600  Gross per 24 hour  Intake   3520 ml  Output   1712 ml  Net   1808 ml    Physical Exam:  BP Readings from Last 3 Encounters:  02/19/12 144/55  02/19/12 144/55  02/12/12 128/74   Temp Readings from Last 3 Encounters:  02/19/12 98.6 F (37 C) Oral  02/19/12 98.6 F (37 C) Oral  02/12/12 97.2 F (36.2 C)    SpO2 Readings from Last 3 Encounters:  02/19/12 98%  02/19/12 98%  02/12/12 97%   Pulse Readings from Last 3 Encounters:  02/19/12 92  02/19/12 92  02/12/12 71    Pt is A&O x 3 Gait is normal Speech is fluent right Neck Wound is healing well, moderate swelling/hematoma JP 7cc in 12 hours Patient with Negative tongue deviation and Negative facial droop Pt has good and  equal strength in all extremities  Assessment: Penny James is a 61 y.o. female is S/P Right Carotid endarterectomy Pt is voiding, ambulating and taking po well She has mod neck swelling sec to post-op bleeding which has resolved with min drainage overnight in her JP - pt allergic to protamine and could not reverse heparin   Plan: Transfer to 2000 DC JP Post-op acute on chronic anemia - cont iron supplement Pt to take home meds, cannot tolerate generic meds Follow-up in 2 weeks   Marlowe Shores [phone] 02/19/2012 7:35 AM   I have examined the patient, reviewed and agree with above.  Luwana Butrick, MD 02/20/2012 2:49 PM

## 2012-02-20 LAB — GLUCOSE, CAPILLARY
Glucose-Capillary: 108 mg/dL — ABNORMAL HIGH (ref 70–99)
Glucose-Capillary: 208 mg/dL — ABNORMAL HIGH (ref 70–99)

## 2012-02-20 MED ORDER — FLUTICASONE PROPIONATE 50 MCG/ACT NA SUSP
2.0000 | Freq: Every day | NASAL | Status: DC | PRN
  Filled 2012-02-20: qty 16

## 2012-02-20 NOTE — Progress Notes (Addendum)
VASCULAR AND VEIN SURGERY POST - OP CEA PROGRESS NOTE  Date of Surgery: 02/18/2012 Surgeon: Moishe Spice): Penny Earthly, MD 2 Days Post-Op right Carotid Endarterectomy .  HPI: Penny James is a 61 y.o. female who is 2 Days Post-Op right Carotid Endarterectomy . Patient is doing well except C/O diff coughing up phlegm Patient denies headache; Patient denies difficulty swallowing; but appears to be swallowing several times denies weakness in upper or lower extremities; Pt. denies other symptoms of stroke or TIA.  IMAGING: No results found.  Significant Diagnostic Studies: CBC Lab Results  Component Value Date   WBC 9.4 02/19/2012   HGB 9.1* 02/19/2012   HCT 26.5* 02/19/2012   MCV 84.1 02/19/2012   PLT 171 02/19/2012    BMET    Component Value Date/Time   NA 139 02/19/2012 0355   K 4.5 02/19/2012 0355   CL 104 02/19/2012 0355   CO2 24 02/19/2012 0355   GLUCOSE 186* 02/19/2012 0355   BUN 27* 02/19/2012 0355   CREATININE 1.28* 02/19/2012 0355   CREATININE 0.91 02/01/2012 0709   CALCIUM 8.4 02/19/2012 0355   GFRNONAA 44* 02/19/2012 0355   GFRAA 50* 02/19/2012 0355    COAG Lab Results  Component Value Date   INR 1.04 02/12/2012   No results found for this basename: PTT      Intake/Output Summary (Last 24 hours) at 02/20/12 0806 Last data filed at 02/19/12 2059  Gross per 24 hour  Intake   1480 ml  Output      0 ml  Net   1480 ml    Physical Exam:  BP Readings from Last 3 Encounters:  02/20/12 132/64  02/20/12 132/64  02/12/12 128/74   Temp Readings from Last 3 Encounters:  02/20/12 98.9 F (37.2 C) Oral  02/20/12 98.9 F (37.2 C) Oral  02/12/12 97.2 F (36.2 C)    SpO2 Readings from Last 3 Encounters:  02/20/12 97%  02/20/12 97%  02/12/12 97%   Pulse Readings from Last 3 Encounters:  02/20/12 80  02/20/12 80  02/12/12 71    Pt is A&O x 3 Gait is normal Speech is fluent right Neck Wound is healing well but remains soft and moderately swollen Patient  with Negative tongue deviation and Negative facial droop Pt has good and equal strength in all extremities  Assessment: Penny James is a 61 y.o. female is S/P Right Carotid endarterectomy Pt is voiding, ambulating and taking po well   Plan: ? If pt having diff swallowing or just sec to swelling. She is able to swallow liquids Continue to ambulate  Marlowe Shores [phone] 02/20/2012 8:06 AM   I have examined the patient, reviewed and agree with above.  Lalonnie Shaffer, MD 02/20/2012 2:49 PM

## 2012-02-20 NOTE — Progress Notes (Signed)
Patient ID: Penny James, female   DOB: Jul 08, 1951, 61 y.o.   MRN: [id number] The patient sitting up in a chair. She does have a headache which is began earlier this afternoon and is being treated with oral medication. She is intact neurologically. She reports that her swallowing some better and she is for a swallowing evaluation tomorrow. She does have some oozing from the drain site but softening of the hematoma in her right neck with no evidence of airway obstruction.

## 2012-02-20 NOTE — Evaluation (Signed)
Clinical/Bedside Swallow Evaluation Patient Details  Name: Penny James MRN: [id number] DOB: 09-24-1951 Today's Date: 02/20/2012  Past Medical History:  Past Medical History  Diagnosis Date  . Diabetes mellitus   . Hyperlipidemia   . Hypertension   . CHF (congestive heart failure)   . Arthritis   . Anemia   . Carotid artery occlusion   . Coronary atherosclerosis   . Ischemic cardiomyopathy   . Bipolar affective disorder   . GERD (gastroesophageal reflux disease)   . Obesity   . Peripheral neuropathy   . Depression    Past Surgical History:  Past Surgical History  Procedure Date  . Coronary artery bypass graft   . Abdominal hysterectomy 1984  . Ptca 2003    with stent of left renal artery  . Tonsillectomy     AS A CHILD  . Endarterectomy 02/18/2012    Procedure: ENDARTERECTOMY CAROTID;  Surgeon: Larina Earthly, MD;  Location: [facility] OR;  Service: Vascular;  Laterality: Right;  Right Carotid endarterectomy with Dacron patch angioplasty with resection of internal carotid artery   HPI:  Penny James is a 61 y.o. female who is 2 Days Post-Op right Carotid Endarterectomy . Patient is doing well except C/O diff coughing up phlegm; coughing with meals.   Assessment/Recommendations/Treatment Plan   Clinical Impression: Pt presents with clinical symptoms of pharyngeal dysphagia marked by immediate coughing after consumption of thin liquids; voice quality mildly hoarse. All other CN functions intact.  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  Rec downgrading diet to Dysphagia 3 with honey-thick liquids and FEES assessment 4/25.    PLEASE ORDER DIET CHANGES AND FEES EXAM.  Risk for Aspiration: Moderate  Swallow Evaluation Recommendations Recommended Consults: FEES Diet Recommendations: Dysphagia 3 (Mechanical Soft);Honey-thick liquid Liquid  Administration via: Cup Medication Administration: Whole meds with liquid Supervision: Patient able to self feed Oral Care Recommendations: Oral care BID Other Recommendations: Order thickener from pharmacy Follow up Recommendations: Other (comment) (tba)  Treatment Plan Treatment Plan Recommendations: F/U FEES in one day Speech Therapy Frequency: min 2x/week Treatment Duration: 1 week Interventions: Diet toleration management by SLP;Patient/family education  Prognosis Prognosis for Safe Diet Advancement: Good  Individuals Consulted Consulted and Agree with Results and Recommendations: Patient  Swallowing Goals  SLP Swallowing Goals Patient will consume recommended diet without observed clinical signs of aspiration with: Independent assistance  Penny James L. Penny James, [place] CCC/SLP Pager [phone]   Penny James 02/20/2012,12:37 PM

## 2012-02-21 LAB — GLUCOSE, CAPILLARY
Glucose-Capillary: 116 mg/dL — ABNORMAL HIGH (ref 70–99)
Glucose-Capillary: 177 mg/dL — ABNORMAL HIGH (ref 70–99)

## 2012-02-21 MED ORDER — OXYCODONE-ACETAMINOPHEN 5-325 MG PO TABS
1.0000 | ORAL_TABLET | ORAL | Status: AC | PRN
Start: 2012-02-21 — End: 2012-03-02

## 2012-02-21 NOTE — Discharge Summary (Signed)
[facility] Discharge Summary   Patient ID:  Penny James MRN: [id number] DOB/AGE: 1951-08-24 61 y.o.  Admit date: 02/18/2012 Discharge date: 02/21/2012 Date of Surgery: 02/18/2012 Surgeon: Surgeon(s): Larina Earthly, MD  Admission Diagnosis: Right internal carotid artery stenosis   Discharge Diagnoses:  Right internal carotid artery stenosis   Secondary Diagnoses: Past Medical History  Diagnosis Date  . Diabetes mellitus   . Hyperlipidemia   . Hypertension   . CHF (congestive heart failure)   . Arthritis   . Anemia   . Carotid artery occlusion   . Coronary atherosclerosis   . Ischemic cardiomyopathy   . Bipolar affective disorder   . GERD (gastroesophageal reflux disease)   . Obesity   . Peripheral neuropathy   . Depression     Procedure(s): ENDARTERECTOMY CAROTID  Discharged Condition: good  HPI:  Penny James is a 61 y.o. female was seen by Dr Early for discussion of her asymptomatic carotid disease. She had had a carotid duplex showing progression to greater than 80% right carotid stenosis in the 60-80% left carotid stenosis.she remains asymptomatic. She did have extensive plaque and some tortuosity and therefore I recommended CT angiogram for further evaluation.  CT angiogram revealed severe greater than 80% right internal carotid artery stenosis at the bifurcation moderate approximately 60% stenosis the left internal carotid artery at the bifurcation  Pt with critical stenosis in right internal carotid artery. Dr Early recommended right carotid endarterectomy for reduction of stroke risk. She was admitted for right CEA on 02/18/12.  Hospital Course:  Penny James is a 61 y.o. female is S/P Right Procedure(s): ENDARTERECTOMY CAROTID Extubated: POD # 0 Post-op wounds moderate hematoma but healing well She had JP drain which was d/c on POD #1 Pt had some issues with swallowing but was able to take a dys 3 diet without  difficulty. She had swallow eval and FEES study Pt. Ambulating, voiding and taking PO diet without difficulty. Pt pain controlled with PO pain meds. Labs as below Complications:post-op hematoma sec. To pt being allergic to protamine so the intra-op heparin could not be reversed Some diff swallowing but improved with decreased swelling in the neck FEES study - mild pharyngeal dysphagia secondary to edema s/p right carotid endarterectomy  She is safe to initiate a regular diet and thin liquids and safe to discharge home without any SLP f/u.   Consults:     Significant Diagnostic Studies: CBC Lab Results  Component Value Date   WBC 9.4 02/19/2012   HGB 9.1* 02/19/2012   HCT 26.5* 02/19/2012   MCV 84.1 02/19/2012   PLT 171 02/19/2012    BMET    Component Value Date/Time   NA 139 02/19/2012 0355   K 4.5 02/19/2012 0355   CL 104 02/19/2012 0355   CO2 24 02/19/2012 0355   GLUCOSE 186* 02/19/2012 0355   BUN 27* 02/19/2012 0355   CREATININE 1.28* 02/19/2012 0355   CREATININE 0.91 02/01/2012 0709   CALCIUM 8.4 02/19/2012 0355   GFRNONAA 44* 02/19/2012 0355   GFRAA 50* 02/19/2012 0355   COAG Lab Results  Component Value Date   INR 1.04 02/12/2012     Disposition:  Discharge to :Home Discharge Orders    Future Orders Please Complete By Expires   Resume previous diet      Driving Restrictions      Comments:   No driving for 2 weeks   Lifting restrictions      Comments:   No  lifting for 4 weeks   Call MD for:  temperature >100.5      Call MD for:  redness, tenderness, or signs of infection (pain, swelling, bleeding, redness, odor or green/yellow discharge around incision site)      Call MD for:  severe or increased pain, loss or decreased feeling  in affected limb(s)      Increase activity slowly      Comments:   Walk with assistance use walker or cane as needed   May shower       Change dressing (specify)      Comments:   Dressing change: as needed over drain site   CAROTID Sugery:  Call MD for difficulty swallowing or speaking; weakness in arms or legs that is a new symtom; severe headache.  If you have increased swelling in the neck and/or  are having difficulty breathing, CALL 911         Brandy, Zuba  Home Medication Instructions [id number]   Printed on:02/21/12 0844  Medication Information                    lansoprazole (PREVACID) 15 MG capsule Take 15 mg by mouth daily.             glucagon (GLUCAGEN) 1 MG SOLR Inject into the vein once as needed.             glipiZIDE (GLUCOTROL) 10 MG tablet Take 10 mg by mouth daily.             mometasone (NASONEX) 50 MCG/ACT nasal spray Place 2 sprays into the nose daily.             amLODipine (NORVASC) 5 MG tablet Take 5 mg by mouth daily.             carvedilol (COREG) 25 MG tablet Take 25 mg by mouth 2 (two) times daily with a meal.             irbesartan (AVAPRO) 150 MG tablet Take 150 mg by mouth every morning.            furosemide (LASIX) 40 MG tablet Take 40 mg by mouth daily.            ezetimibe (ZETIA) 10 MG tablet Take 10 mg by mouth daily.             rosuvastatin (CRESTOR) 40 MG tablet Take 40 mg by mouth daily.             isosorbide mononitrate (IMDUR) 30 MG 24 hr tablet Take 30 mg by mouth daily.             aspirin 325 MG tablet Take 325 mg by mouth daily.             clopidogrel (PLAVIX) 75 MG tablet Take 75 mg by mouth daily.             ferrous sulfate 325 (65 FE) MG tablet Take 325 mg by mouth daily with breakfast.             Multiple Vitamins-Minerals (MULTIVITAL) tablet Take 1 tablet by mouth daily.              Verbal and written Discharge instructions given to the patient. Wound care per Discharge AVS Follow-up Information    Follow up with EARLY, TODD, MD in 2 weeks. (office will arrange - sent)    Contact information:   [address] [facility]  [place] [address] [phone]          Signed: Marlowe Shores 02/21/2012, 8:44 AM

## 2012-02-21 NOTE — Procedures (Signed)
Objective Swallowing Evaluation: Fiberoptic Endoscopic Evaluation of Swallowing  Patient Details  Name: Penny James MRN: [id number] Date of Birth: 18-Jan-1951  Today's Date: 02/21/2012 Time:  -     Past Medical History:  Past Medical History  Diagnosis Date  . Diabetes mellitus   . Hyperlipidemia   . Hypertension   . CHF (congestive heart failure)   . Arthritis   . Anemia   . Carotid artery occlusion   . Coronary atherosclerosis   . Ischemic cardiomyopathy   . Bipolar affective disorder   . GERD (gastroesophageal reflux disease)   . Obesity   . Peripheral neuropathy   . Depression    Past Surgical History:  Past Surgical History  Procedure Date  . Coronary artery bypass graft   . Abdominal hysterectomy 1984  . Ptca 2003    with stent of left renal artery  . Tonsillectomy     AS A CHILD  . Endarterectomy 02/18/2012    Procedure: ENDARTERECTOMY CAROTID;  Surgeon: Larina Earthly, MD;  Location: [facility] OR;  Service: Vascular;  Laterality: Right;  Right Carotid endarterectomy with Dacron patch angioplasty with resection of internal carotid artery   HPI:  Penny James is a 61 y.o. female who is 2 Days Post-Op right Carotid Endarterectomy . Patient is doing well except C/O diff coughing up phlegm; coughing with meals.  Put on honey thick liquids after showing s/s of aspriation with thin liquids. FEES for objective evaluation of swallow     Recommendation/Prognosis  Clinical Impression Clinical impression: Pt presents with a mild pharyngeal dysphagia secondary to edema s/p right carotid endarterectomy with one instance of flash penetration, no aspiration observed. Pt strength is WFL with no evidence of CN X impairment. Pt utilizing compensatory strategies similar to supraglottic swallow to protect airway despite edema. She is safe to initiate a regular diet and thin liquids and safe to discharge home without any SLP f/u.  Swallow Evaluation Recommendations Diet  Recommendations: Regular;Thin liquid Liquid Administration via: Cup;Straw Medication Administration: Whole meds with liquid Supervision: Patient able to self feed Compensations: Slow rate;Small sips/bites Postural Changes and/or Swallow Maneuvers: Seated upright 90 degrees;Out of bed for meals Oral Care Recommendations: Oral care BID Follow up Recommendations: None   Individuals Consulted Consulted and Agree with Results and Recommendations: Patient;Family member/caregiver  SLP Assessment/Plan Clinical impression: Pt presents with a mild pharyngeal dysphagia secondary to edema s/p right carotid endarterectomy with one instance of flash penetration, no aspiration observed. Pt strength is WFL with no evidence of CN X impairment. Pt utilizing compensatory strategies similar to supraglottic swallow to protect airway despite edema. She is safe to initiate a regular diet and thin liquids and safe to discharge home without any SLP f/u.   General:  Date of Onset: 02/18/12 HPI: Penny James is a 61 y.o. female who is 2 Days Post-Op right Carotid Endarterectomy . Patient is doing well except C/O diff coughing up phlegm; coughing with meals.  Put on honey thick liquids after showing s/s of aspriation with thin liquids. FEES for objective evaluation of swallow Type of Study: Fiberoptic Endoscopic Evaluation of Swallowing Diet Prior to this Study: Dysphagia 3 (soft);Honey-thick liquids Temperature Spikes Noted: No Behavior/Cognition: Alert;Cooperative;Pleasant mood Oral Cavity - Dentition: Adequate natural dentition Oral Motor / Sensory Function: Within functional limits Vision: Functional for self-feeding Patient Positioning: Upright in bed Baseline Vocal Quality: Clear Volitional Cough: Strong Volitional Swallow: Able to elicit Anatomy: Other (Comment) (Erythema on Right arytenoid and pharyngeal wall, edematous) Pharyngeal Secretions:  Standing secretions in (comment)  Oral Phase Oral  Preparation/Oral Phase Oral Phase: WFL Pharyngeal Phase  Pharyngeal Phase Pharyngeal Phase: Impaired Pharyngeal - Nectar Pharyngeal - Nectar Cup: Pharyngeal residue - posterior pharnyx;Pharyngeal residue - pyriform;Pharyngeal residue - valleculae;Premature spillage to valleculae Pharyngeal - Thin Pharyngeal - Thin Cup: Penetration/Aspiration before swallow;Premature spillage to valleculae Penetration/Aspiration details (thin cup): Material enters airway, CONTACTS cords then ejected out Pharyngeal - Thin Straw: Within functional limits Pharyngeal - Solids Pharyngeal - Mechanical Soft: Within functional limits Cervical Esophageal Phase    Harlon Ditty, MA CCC-SLP [phone]  Claudine Mouton 02/21/2012, 2:48 PM

## 2012-02-21 NOTE — Progress Notes (Signed)
Patient ID: Penny James, female   DOB: 1951-06-17, 61 y.o.   MRN: [id number] Up in chair this morning. She had a gallbladder breakfast with no difficulty. She does not have any choking or difficulty with swallowing. This seems markedly improved with the the improvement in her postoperative swelling. She is scheduled for swallowing evaluation this morning and we will plan discharge following this. She remains intact neurologically. He is stable hemodynamically. We plan to see her again in 2-3 weeks in the office for followup. This was discussed with the patient and her husband present

## 2012-02-22 ENCOUNTER — Telehealth: Payer: Self-pay

## 2012-02-22 NOTE — Telephone Encounter (Signed)
Pt. Called w/ c/o feeling that "balance is off", and to report severe ache right ear.  Also reports BS of 179 today.  Pt. denies dizziness when questioned.  States ear pain has improved since she took her pain medication.  Denies any change in vision, difficulty w/ speech, or weakness in arms/legs.  States has some swelling in right neck.  Advised to monitor swelling, and call office if swelling increases, or feels a pulsation within the swollen area.  Denies any pulsation at right neck incision today.  States has had a headache, and rated at "6/10".  Informed pt. to report if headache persists and increases in intensity.  Advised pt. about the reperfusion headache that can occur with improvement in blood flow to brain after carotid surgery.  Advised to continue to monitor BS, and report consistently high readings to her PCP.  All questions answered, and advised to call office if worsening symptoms. Verb. Understanding.

## 2012-02-25 ENCOUNTER — Telehealth: Payer: Self-pay

## 2012-02-25 NOTE — Telephone Encounter (Signed)
Phone call from pt.  Reports saw her PCP for problems with dizziness and elevated BS.  Her PCP advised her to see Dr. Arbie Cookey ASAP for right neck swelling.  Pt. denies any pulsation that is palpable within the swollen site at right neck.  Unsure about redness or inflammation; states "I can't see it because the dressing covers it".    Appt. given 02/26/12 @ 10:00 AM for pt. to be evaluated per nurse practitioner.  Agrees w/ plan.

## 2012-02-26 ENCOUNTER — Encounter: Payer: Self-pay | Admitting: Neurosurgery

## 2012-02-26 ENCOUNTER — Ambulatory Visit (INDEPENDENT_AMBULATORY_CARE_PROVIDER_SITE_OTHER): Payer: Medicare Other | Admitting: Neurosurgery

## 2012-02-26 VITALS — BP 158/70 | HR 78 | Temp 97.0°F | Resp 16 | Ht 63.0 in | Wt 178.5 lb

## 2012-02-26 DIAGNOSIS — I6529 Occlusion and stenosis of unspecified carotid artery: Secondary | ICD-10-CM

## 2012-02-26 NOTE — Progress Notes (Signed)
Subjective:     Patient ID: Penny James, female   DOB: 12-Mar-1951, 61 y.o.   MRN: [id number]  HPI: This is a 61 year old patient of Dr. Arbie Cookey so seen for a right carotid endarterectomy wound check. Patient underwent a right carotid endarterectomy on April 22, Dr. Arbie Cookey states she did have a hematoma on discharge but her dysphasia was not severe enough to cause any problems. The patient reports no change in her swallowing  and she has no breathing difficulties.   Review of Systems: 12 point review of systems is notable for the difficulties described above otherwise unremarkable     Objective:   Physical Exam: Vital signs are stable, wound is healing there is a fairly large hematoma that is self resolving. Dr. Arbie Cookey did examine the patient and go over her symptoms now versus at discharge.     Assessment:     Assessment as above    Plan:     Per Dr. Arbie Cookey, the patient will followup in 2-3 weeks with him for wound check her questions were encouraged and answered     Lauree Chandler ANP  Clinic M.D.: Early

## 2012-03-17 ENCOUNTER — Encounter: Payer: Self-pay | Admitting: Vascular Surgery

## 2012-03-18 ENCOUNTER — Encounter: Payer: Self-pay | Admitting: Vascular Surgery

## 2012-03-18 ENCOUNTER — Ambulatory Visit (INDEPENDENT_AMBULATORY_CARE_PROVIDER_SITE_OTHER): Payer: Medicare Other | Admitting: Vascular Surgery

## 2012-03-18 VITALS — BP 140/47 | HR 76 | Resp 16 | Ht 63.0 in | Wt 180.9 lb

## 2012-03-18 DIAGNOSIS — I6529 Occlusion and stenosis of unspecified carotid artery: Secondary | ICD-10-CM

## 2012-03-18 NOTE — Progress Notes (Signed)
The patient has today for continued followup of her right carotid endarterectomy on or 22 2013. She did have some hematoma in the incision following surgery and this was continued to be followed nonoperatively. She has resolved this completely. He has no swallowing difficulty no voice difficulty and has had no neurologic deficits.  Physical exam: Soft right carotid bruit no bruit on the left well-healed carotid incision with the typical healing ridge.  Stable status post carotid endarterectomy. She will see [place] again in 6 months notify should she develop any difficulty in the interim

## 2012-03-19 NOTE — Progress Notes (Signed)
Addended by: Sharee Pimple on: 03/19/2012 09:29 AM   Modules accepted: Orders

## 2012-09-09 ENCOUNTER — Other Ambulatory Visit: Payer: Self-pay | Admitting: Family Medicine

## 2012-09-09 DIAGNOSIS — Z1231 Encounter for screening mammogram for malignant neoplasm of breast: Secondary | ICD-10-CM

## 2012-09-17 ENCOUNTER — Other Ambulatory Visit: Payer: Medicare Other

## 2012-09-17 ENCOUNTER — Ambulatory Visit: Payer: Medicare Other | Admitting: Neurosurgery

## 2012-09-23 ENCOUNTER — Encounter: Payer: Self-pay | Admitting: Neurosurgery

## 2012-09-24 ENCOUNTER — Ambulatory Visit (INDEPENDENT_AMBULATORY_CARE_PROVIDER_SITE_OTHER): Payer: Medicare Other | Admitting: Neurosurgery

## 2012-09-24 ENCOUNTER — Other Ambulatory Visit (INDEPENDENT_AMBULATORY_CARE_PROVIDER_SITE_OTHER): Payer: Medicare Other | Admitting: *Deleted

## 2012-09-24 ENCOUNTER — Encounter: Payer: Self-pay | Admitting: Neurosurgery

## 2012-09-24 VITALS — BP 137/53 | HR 71 | Ht 63.0 in | Wt 185.0 lb

## 2012-09-24 DIAGNOSIS — I6529 Occlusion and stenosis of unspecified carotid artery: Secondary | ICD-10-CM

## 2012-09-24 DIAGNOSIS — Z48812 Encounter for surgical aftercare following surgery on the circulatory system: Secondary | ICD-10-CM

## 2012-09-24 NOTE — Progress Notes (Signed)
[facility] [place] Carotid Office Note  CC: Carotid surveillance Referring Physician: Early  History of Present Illness: 61 year old female patient of Dr. Arbie Cookey status post right CEA in April 2013. The patient denies any signs or symptoms of CVA, TIA, amaurosis fugax or any neural deficit. The patient denies any new medical diagnoses or recent surgery.  Past Medical History  Diagnosis Date  . Diabetes mellitus   . Hyperlipidemia   . Hypertension   . CHF (congestive heart failure)   . Arthritis   . Anemia   . Carotid artery occlusion   . Coronary atherosclerosis   . Ischemic cardiomyopathy   . Bipolar affective disorder   . GERD (gastroesophageal reflux disease)   . Obesity   . Peripheral neuropathy   . Depression     ROS: [x]  Positive   [ ]  Denies    General: [ ]  Weight loss, [ ]  Fever, [ ]  chills Neurologic: [ ]  Dizziness, [ ]  Blackouts, [ ]  Seizure [ ]  Stroke, [ ]  "Mini stroke", [ ]  Slurred speech, [ ]  Temporary blindness; [ ]  weakness in arms or legs, [ ]  Hoarseness Cardiac: [ ]  Chest pain/pressure, [ ]  Shortness of breath at rest [ ]  Shortness of breath with exertion, [ ]  Atrial fibrillation or irregular heartbeat Vascular: [ ]  Pain in legs with walking, [ ]  Pain in legs at rest, [ ]  Pain in legs at night,  [ ]  Non-healing ulcer, [ ]  Blood clot in vein/DVT,   Pulmonary: [ ]  Home oxygen, [ ]  Productive cough, [ ]  Coughing up blood, [ ]  Asthma,  [ ]  Wheezing Musculoskeletal:  [ ]  Arthritis, [ ]  Low back pain, [ ]  Joint pain Hematologic: [ ]  Easy Bruising, [ ]  Anemia; [ ]  Hepatitis Gastrointestinal: [ ]  Blood in stool, [ ]  Gastroesophageal Reflux/heartburn, [ ]  Trouble swallowing Urinary: [ ]  chronic Kidney disease, [ ]  on HD - [ ]  MWF or [ ]  TTHS, [ ]  Burning with urination, [ ]  Difficulty urinating Skin: [ ]  Rashes, [ ]  Wounds Psychological: [ ]  Anxiety, [ ]  Depression   Social History History  Substance Use Topics  . Smoking status: Never  Smoker   . Smokeless tobacco: Never Used  . Alcohol Use: No    Family History Family History  Problem Relation Age of Onset  . Heart disease Brother   . Glaucoma Father   . Stroke Paternal Grandmother   . Coronary artery disease Paternal Grandmother   . Cerebral aneurysm Brother   . Clotting disorder Sister     Allergies  Allergen Reactions  . Penicillins Rash  . Protamine     Unknown reaction    Current Outpatient Prescriptions  Medication Sig Dispense Refill  . amLODipine (NORVASC) 5 MG tablet Take 5 mg by mouth daily.        [name] aspirin 325 MG tablet Take 325 mg by mouth daily.        . carvedilol (COREG) 25 MG tablet Take 25 mg by mouth 2 (two) times daily with a meal.        . clopidogrel (PLAVIX) 75 MG tablet Take 75 mg by mouth daily.        [name] ezetimibe (ZETIA) 10 MG tablet Take 10 mg by mouth daily.        . furosemide (LASIX) 40 MG tablet Take 40 mg by mouth daily.       [name] glipiZIDE (GLUCOTROL) 10 MG tablet Take 10 mg by mouth daily.        [name]  irbesartan (AVAPRO) 150 MG tablet Take 150 mg by mouth every morning.       . isosorbide mononitrate (IMDUR) 30 MG 24 hr tablet Take 30 mg by mouth daily.        . lansoprazole (PREVACID) 15 MG capsule Take 15 mg by mouth daily.        . mometasone (NASONEX) 50 MCG/ACT nasal spray Place 2 sprays into the nose daily.        . Multiple Vitamins-Minerals (MULTIVITAL) tablet Take 1 tablet by mouth daily.        . rosuvastatin (CRESTOR) 40 MG tablet Take 40 mg by mouth daily.        . ferrous sulfate 325 (65 FE) MG tablet Take 325 mg by mouth daily with breakfast.        . glucagon (GLUCAGEN) 1 MG SOLR Inject into the vein once as needed.          Physical Examination  Filed Vitals:   09/24/12 1036  BP: 137/53  Pulse:     Body mass index is 32.77 kg/(m^2).  General:  WDWN in NAD Gait: Normal HEENT: WNL Eyes: Pupils equal Pulmonary: normal non-labored breathing , without Rales, rhonchi,  wheezing Cardiac: RRR, without   Murmurs, rubs or gallops; Abdomen: soft, NT, no masses Skin: no rashes, ulcers noted  Vascular Exam Pulses: 2+ radial pulses bilaterally Carotid bruits: Carotid pulses heard on the left, mild bruit heard on the right Extremities without ischemic changes, no Gangrene , no cellulitis; no open wounds;  Musculoskeletal: no muscle wasting or atrophy   Neurologic: A&O X 3; Appropriate Affect ; SENSATION: normal; MOTOR FUNCTION:  moving all extremities equally. Speech is fluent/normal  Non-Invasive Vascular Imaging CAROTID DUPLEX 09/24/2012  Right ICA 60 - 79 % stenosis Left ICA 40 - 59 % stenosis   ASSESSMENT/PLAN: Asymptomatic patient with questionable recurrent right ICA stenosis, the report will be placed for Dr. Bosie Helper review. The patient will followup in 3 months for repeat carotid duplex and see Dr. Arbie Cookey. Her questions were encouraged and answered, she is in agreement with this plan.  Lauree Chandler ANP Clinic MD: Early

## 2012-09-24 NOTE — Addendum Note (Signed)
Addended by: Sharee Pimple on: 09/24/2012 02:27 PM   Modules accepted: Orders

## 2012-10-07 ENCOUNTER — Encounter: Payer: Self-pay | Admitting: Gastroenterology

## 2012-10-13 ENCOUNTER — Ambulatory Visit (INDEPENDENT_AMBULATORY_CARE_PROVIDER_SITE_OTHER): Payer: Medicare Other | Admitting: *Deleted

## 2012-10-13 DIAGNOSIS — M79609 Pain in unspecified limb: Secondary | ICD-10-CM

## 2012-10-20 ENCOUNTER — Ambulatory Visit
Admission: RE | Admit: 2012-10-20 | Discharge: 2012-10-20 | Disposition: A | Discharge status: Home / Self Care | Payer: Medicare Other | Source: Ambulatory Visit | Attending: Family Medicine | Admitting: Family Medicine

## 2012-10-20 DIAGNOSIS — Z1231 Encounter for screening mammogram for malignant neoplasm of breast: Secondary | ICD-10-CM

## 2012-11-04 ENCOUNTER — Other Ambulatory Visit: Payer: Self-pay | Admitting: *Deleted

## 2012-11-04 DIAGNOSIS — I739 Peripheral vascular disease, unspecified: Secondary | ICD-10-CM

## 2012-11-06 ENCOUNTER — Encounter: Payer: Self-pay | Admitting: Gastroenterology

## 2012-11-06 ENCOUNTER — Ambulatory Visit (INDEPENDENT_AMBULATORY_CARE_PROVIDER_SITE_OTHER): Payer: Medicare Other | Admitting: Gastroenterology

## 2012-11-06 VITALS — BP 122/60 | HR 69 | Ht 64.0 in | Wt 184.0 lb

## 2012-11-06 DIAGNOSIS — Z1211 Encounter for screening for malignant neoplasm of colon: Secondary | ICD-10-CM

## 2012-11-06 DIAGNOSIS — I779 Disorder of arteries and arterioles, unspecified: Secondary | ICD-10-CM

## 2012-11-06 DIAGNOSIS — I2510 Atherosclerotic heart disease of native coronary artery without angina pectoris: Secondary | ICD-10-CM

## 2012-11-06 MED ORDER — PEG-KCL-NACL-NASULF-NA ASC-C 100 G PO SOLR: 1.0000 | Freq: Once | ORAL | Status: DC

## 2012-11-06 NOTE — Progress Notes (Signed)
History of Present Illness: This is a 62 year old female referred for consideration of colorectal cancer screening. She states she had a colonoscopy performed many years ago. Probably over 10 or 15 years ago. She is maintained on Plavix. She underwent carotid endarterectomy in April 2013 by Dr. Arbie Cookey. She has a remote history of a renal artery stent and a CABG. Denies weight loss, abdominal pain, constipation, diarrhea, change in stool caliber, melena, hematochezia, nausea, vomiting, dysphagia, reflux symptoms, chest pain.  Review of Systems: Pertinent positive and negative review of systems were noted in the above HPI section. All other review of systems were otherwise negative.  Current Medications, Allergies, Past Medical History, Past Surgical History, Family History and Social History were reviewed in [employer] record.  Physical Exam: General: Well developed , well nourished, no acute distress Head: Normocephalic and atraumatic Eyes:  sclerae anicteric, EOMI Ears: Normal auditory acuity Mouth: No deformity or lesions Neck: Supple, no masses or thyromegaly Lungs: Clear throughout to auscultation Heart: Regular rate and rhythm; no murmurs, rubs or bruits Abdomen: Soft, non tender and non distended. No masses, hepatosplenomegaly or hernias noted. Normal Bowel sounds Rectal: Deferred to colonoscopy Musculoskeletal: Symmetrical with no gross deformities  Skin: No lesions on visible extremities Pulses:  Normal pulses noted Extremities: No clubbing, cyanosis, edema or deformities noted Neurological: Alert oriented x 4, grossly nonfocal Cervical Nodes:  No significant cervical adenopathy Inguinal Nodes: No significant inguinal adenopathy Psychological:  Alert and cooperative. Normal mood and affect  Assessment and Recommendations:  1. Colorectal cancer screening, average risk. Attempt to locate records from her prior colonoscopy. Will obtain clearance from Dr. Arbie Cookey  and Dr. Kevan Ny regarding a 5 day hold Plavix. The risks, benefits, and alternatives to colonoscopy with possible biopsy and possible polypectomy were discussed with the patient and they consent to proceed.

## 2012-11-06 NOTE — Patient Instructions (Addendum)
You have been scheduled for a colonoscopy with propofol. Please follow written instructions given to you at your visit today.  Please pick up your prep kit at the pharmacy within the next 1-3 days. If you use inhalers (even only as needed) or a CPAP machine, please bring them with you on the day of your procedure.  You will be contacted by our office prior to your procedure for directions on holding your Plavix.  If you do not hear from our office 1 week prior to your scheduled procedure, please call [phone] to discuss.   cc: Shaune Pollack, MD

## 2012-11-10 ENCOUNTER — Telehealth: Payer: Self-pay

## 2012-11-10 NOTE — Telephone Encounter (Signed)
Notified patient of Dr. Bosie Helper recommendations for patient to hold Plavix. Pt agreed and verbalized understanding.

## 2012-12-01 ENCOUNTER — Encounter: Payer: Self-pay | Admitting: Vascular Surgery

## 2012-12-02 ENCOUNTER — Ambulatory Visit: Payer: Medicare Other | Admitting: Vascular Surgery

## 2012-12-12 ENCOUNTER — Telehealth: Payer: Self-pay | Admitting: *Deleted

## 2012-12-12 NOTE — Telephone Encounter (Signed)
Ms Mooradian called to say she had stopped taking her Plavix this AM for coming up colonscopy. She is having chest tightness and having a hard time breathing. She asked if it could be because she didn't take the Plavix this AM. I told her more than likely not. I told her to call 911 and to get to the emergency room with her history of cardiac bypass surgery. She said she understood and would go now/jjk

## 2012-12-15 ENCOUNTER — Telehealth: Payer: Self-pay | Admitting: Gastroenterology

## 2012-12-15 NOTE — Telephone Encounter (Signed)
OK for no charge 

## 2012-12-16 ENCOUNTER — Encounter: Payer: Medicare Other | Admitting: Gastroenterology

## 2012-12-29 ENCOUNTER — Encounter: Payer: Self-pay | Admitting: Vascular Surgery

## 2012-12-30 ENCOUNTER — Other Ambulatory Visit: Payer: Medicare Other

## 2012-12-30 ENCOUNTER — Ambulatory Visit: Payer: Medicare Other | Admitting: Vascular Surgery

## 2013-02-02 ENCOUNTER — Encounter: Payer: Self-pay | Admitting: Vascular Surgery

## 2013-02-03 ENCOUNTER — Other Ambulatory Visit (INDEPENDENT_AMBULATORY_CARE_PROVIDER_SITE_OTHER): Payer: Medicare Other | Admitting: *Deleted

## 2013-02-03 ENCOUNTER — Ambulatory Visit (INDEPENDENT_AMBULATORY_CARE_PROVIDER_SITE_OTHER): Payer: Medicare Other | Admitting: Vascular Surgery

## 2013-02-03 ENCOUNTER — Encounter: Payer: Self-pay | Admitting: Vascular Surgery

## 2013-02-03 DIAGNOSIS — Z48812 Encounter for surgical aftercare following surgery on the circulatory system: Secondary | ICD-10-CM

## 2013-02-03 DIAGNOSIS — I6529 Occlusion and stenosis of unspecified carotid artery: Secondary | ICD-10-CM

## 2013-02-03 NOTE — Progress Notes (Signed)
[facility] [place]  Established Carotid Patient  History of Present Illness  Penny James is a 62 y.o. (06-Dec-1950) female who presents today for follow up s/p right carotid endarterectomy and following the left carotid stenosis.   Previous carotid studies demonstrated: RICA 40-59% stenosis, LICA 40-59% stenosis.  Patient has no history of TIA or stroke symptom.  The patient has not had amaurosis fugax or monocular blindness.  The patient has not had facial drooping or hemiplegia.  The patient has not had receptive or expressive aphasia.    The patient has not had any previous neurologic deficits.  Past Medical History, Past Surgical History, Social History, Family History, Medications, Allergies, and Review of Systems are unchanged from previous evaluation on 09-24-2013.  Physical Examination  Filed Vitals:   02/03/13 0954 02/03/13 0959  BP: 129/48 136/47  Pulse: 65 67  Resp: 16   Height: 5' 3.5" (1.613 m)   Weight: 181 lb (82.101 kg)   SpO2: 100% 100%   Body mass index is 31.56 kg/(m^2).  General: A&O x 3, WDWN.  Eyes: PERRLA, EOMI.  Pulmonary: Sym exp, good air movt, CTAB, no rales, rhonchi, & wheezing.  Cardiac: RRR, Nl S1, S2, no Murmurs, rubs or gallops.  Vascular: Vessel Right Left  Radial Palpable Palpable  Ulnar Palpable Palpable  Brachial Palpable Palpable  Carotid Palpable, without bruit Palpable, with bruit                       Gastrointestinal: soft, NTND  Musculoskeletal: M/S 5/5 throughout.  Neurologic: CN 2-12 intact.  Non-Invasive Vascular Imaging  CAROTID DUPLEX (Date: 02-03-2013):   R ICA stenosis: 40-59%   L ICA stenosis: 40-59     Medical Decision Making  Penny James is a 62 y.o. female who presents with: f/u ICA stenosis s/p right carotid endarterectomy and left stenosis stable.   Based on the patient's vascular studies and examination, I have offered the patient: a one year follow up for  carotid duplex..  Dr. Arbie Cookey has discussed in depth with the patient the nature of atherosclerosis, and emphasized the importance of maximal medical management including strict control of blood pressure, blood glucose, and lipid levels, antiplatelet agents, obtaining regular exercise, and cessation of smoking.  The patient is aware that without maximal medical management the underlying atherosclerotic disease process will progress, limiting the benefit of any interventions.  Thank you for allowing [place] to participate in this patient's care.  Thomasena Edis Shariece Viveiros [facility] PA-C [facility] [place] Office: [phone] Pager: [phone]  02/03/2013, 10:26 AM   I have examined the patient, reviewed and agree with above.  EARLY, TODD, MD 02/03/2013 11:12 AM

## 2013-02-03 NOTE — Addendum Note (Signed)
Addended by: Dannielle Karvonen on: 02/03/2013 02:19 PM   Modules accepted: Orders

## 2013-07-29 ENCOUNTER — Other Ambulatory Visit: Payer: Self-pay | Admitting: General Surgery

## 2013-07-29 ENCOUNTER — Telehealth: Payer: Self-pay | Admitting: Cardiology

## 2013-07-29 MED ORDER — ISOSORBIDE MONONITRATE ER 30 MG PO TB24: 30.0000 mg | ORAL_TABLET | Freq: Every day | ORAL | Status: DC

## 2013-07-29 NOTE — Telephone Encounter (Signed)
Rx sent in for pt. 30 day with 6 refills.[name]

## 2013-07-29 NOTE — Telephone Encounter (Signed)
New Problem:  Pt states that she cannot take generic brands. Pt states she needs the name brand med for Imdur.

## 2013-07-30 ENCOUNTER — Other Ambulatory Visit: Payer: Self-pay | Admitting: Cardiology

## 2013-07-30 ENCOUNTER — Other Ambulatory Visit: Payer: Self-pay | Admitting: General Surgery

## 2013-07-30 MED ORDER — ISOSORBIDE MONONITRATE ER 30 MG PO TB24: 30.0000 mg | ORAL_TABLET | Freq: Every day | ORAL | Status: DC

## 2013-07-30 NOTE — Telephone Encounter (Signed)
Please call patient back and check on med.  She has been on Imdur for several years now

## 2013-07-30 NOTE — Telephone Encounter (Signed)
Switching pharmacy to [facility] on [address]. Pt asked for a 90 day supply/

## 2013-07-30 NOTE — Telephone Encounter (Signed)
Pt calling re not being able to take Imdur, and cannot take generic, wants to change med to something comparable , pls call

## 2013-07-30 NOTE — Telephone Encounter (Signed)
To Dr Turner to advise 

## 2013-08-04 MED ORDER — ISOSORBIDE DINITRATE 10 MG PO TABS: 10.0000 mg | ORAL_TABLET | Freq: Two times a day (BID) | ORAL | Status: DC

## 2013-08-04 NOTE — Telephone Encounter (Signed)
I called patient and discussed issue with her medication. She has been taking Imdur 30mg  1 tab every day(name brand) for many years. Her pharmacy has advised her that name brand is no longer available. She has tried generic isosorbide in the past and "ended up in the hospital" because of chest pain. I advised her that I will call [facility] to see if they can order. Spoke with [facility] pharmacy and was advised that it is true that the name brand Imdur has been discontinued. Will ask Dr.Turner for some direction in this matter and call patient back.

## 2013-08-04 NOTE — Telephone Encounter (Signed)
Explained to patient that Dr.Turner would like her to try isosorbide dinatrate 10mg  BID. She wants this sent to [facility] [address].

## 2013-08-04 NOTE — Telephone Encounter (Signed)
New Problem  [facility] couldn't retrieve the recommended medication. Pt asks is there any other name brand drug that she may be able to take.

## 2013-08-04 NOTE — Telephone Encounter (Signed)
Please have her change to Isosorbide dinitrate 10mg  BID

## 2013-10-13 ENCOUNTER — Telehealth: Payer: Self-pay | Admitting: Cardiology

## 2013-10-13 NOTE — Telephone Encounter (Signed)
Forward to [facility]

## 2013-10-13 NOTE — Telephone Encounter (Signed)
It looks like Dr. Mayford Knife last saw patient on 12/2011.  Dr. Mayford Knife do you want her to go through her PCP for this?  Please advise Danielle what you would like done.

## 2013-10-13 NOTE — Telephone Encounter (Signed)
Please defer to PCP

## 2013-10-13 NOTE — Telephone Encounter (Signed)
New problem   Pt called she needs AVAEPRO 150 mg called in, pt is completely out.   [facility]   [address]  [phone].

## 2013-10-13 NOTE — Telephone Encounter (Signed)
Riki Rusk can you help me with this/

## 2013-10-13 NOTE — Telephone Encounter (Signed)
New problem        Pt needs the hold to be taken off med rec. so that she can get her medication.   10/29/13 pt will make her first payment.  Have any questions please give pt a call.

## 2013-10-14 ENCOUNTER — Encounter (HOSPITAL_COMMUNITY): Payer: Self-pay | Admitting: Emergency Medicine

## 2013-10-14 ENCOUNTER — Emergency Department (HOSPITAL_COMMUNITY): Payer: Medicare Other

## 2013-10-14 ENCOUNTER — Emergency Department (HOSPITAL_COMMUNITY)
Admission: EM | Admit: 2013-10-14 | Discharge: 2013-10-14 | Disposition: A | Discharge status: Home / Self Care | Payer: Medicare Other | Attending: Emergency Medicine | Admitting: Emergency Medicine

## 2013-10-14 DIAGNOSIS — D649 Anemia, unspecified: Secondary | ICD-10-CM | POA: Insufficient documentation

## 2013-10-14 DIAGNOSIS — E669 Obesity, unspecified: Secondary | ICD-10-CM | POA: Insufficient documentation

## 2013-10-14 DIAGNOSIS — J40 Bronchitis, not specified as acute or chronic: Secondary | ICD-10-CM

## 2013-10-14 DIAGNOSIS — Z7982 Long term (current) use of aspirin: Secondary | ICD-10-CM | POA: Insufficient documentation

## 2013-10-14 DIAGNOSIS — I2510 Atherosclerotic heart disease of native coronary artery without angina pectoris: Secondary | ICD-10-CM | POA: Insufficient documentation

## 2013-10-14 DIAGNOSIS — IMO0002 Reserved for concepts with insufficient information to code with codable children: Secondary | ICD-10-CM | POA: Insufficient documentation

## 2013-10-14 DIAGNOSIS — Z8659 Personal history of other mental and behavioral disorders: Secondary | ICD-10-CM | POA: Insufficient documentation

## 2013-10-14 DIAGNOSIS — E119 Type 2 diabetes mellitus without complications: Secondary | ICD-10-CM | POA: Insufficient documentation

## 2013-10-14 DIAGNOSIS — Z8669 Personal history of other diseases of the nervous system and sense organs: Secondary | ICD-10-CM | POA: Insufficient documentation

## 2013-10-14 DIAGNOSIS — M1990 Unspecified osteoarthritis, unspecified site: Secondary | ICD-10-CM | POA: Insufficient documentation

## 2013-10-14 DIAGNOSIS — I10 Essential (primary) hypertension: Secondary | ICD-10-CM | POA: Insufficient documentation

## 2013-10-14 DIAGNOSIS — Z7902 Long term (current) use of antithrombotics/antiplatelets: Secondary | ICD-10-CM | POA: Insufficient documentation

## 2013-10-14 DIAGNOSIS — K219 Gastro-esophageal reflux disease without esophagitis: Secondary | ICD-10-CM | POA: Insufficient documentation

## 2013-10-14 DIAGNOSIS — Z880 Allergy status to penicillin: Secondary | ICD-10-CM | POA: Insufficient documentation

## 2013-10-14 DIAGNOSIS — E785 Hyperlipidemia, unspecified: Secondary | ICD-10-CM | POA: Insufficient documentation

## 2013-10-14 DIAGNOSIS — Z79899 Other long term (current) drug therapy: Secondary | ICD-10-CM | POA: Insufficient documentation

## 2013-10-14 DIAGNOSIS — I509 Heart failure, unspecified: Secondary | ICD-10-CM | POA: Insufficient documentation

## 2013-10-14 DIAGNOSIS — Z951 Presence of aortocoronary bypass graft: Secondary | ICD-10-CM | POA: Insufficient documentation

## 2013-10-14 DIAGNOSIS — J209 Acute bronchitis, unspecified: Secondary | ICD-10-CM | POA: Insufficient documentation

## 2013-10-14 MED ORDER — AZITHROMYCIN 250 MG PO TABS: ORAL_TABLET | ORAL | Status: DC

## 2013-10-14 NOTE — ED Notes (Signed)
Pt c/o dry cough x  9 days.  Pt sts "the cough started after I began taking the generic version of IMDUR."

## 2013-10-14 NOTE — ED Provider Notes (Signed)
CSN: [id number]     Arrival date & time 10/14/13  0801 History   First MD Initiated Contact with Patient 10/14/13 [phone]     Chief Complaint  Patient presents with  . Cough   (Consider location/radiation/quality/duration/timing/severity/associated sxs/prior Treatment) HPI  Patient reports they stopped making her Imdur and she had to switch to generic Isordil 2 weeks ago. She states 3 days later she started having a cough. Sometimes she produces sputum that she describes as white mixed with green. She denies fever, shortness of breath, wheezing, sore throat, or rhinorrhea. She denies any swelling in her extremities. She denies nausea, vomiting or diarrhea. She denies being around anybody else who is ill. She states she also had to switch from Glucotrol XL to generic glipizide however her CBGs have been good. She states this morning her CBG was 87, yesterday evening after eating it was 97. She feels the cough is because she was switched off the Imdur to the generic isorbide. These changes were made because her old medications were no longer manufactured. Pt has hx of CHF, HTN and AODM. She denies being on an ACEI which can cause coughing.   PCP Dr Rulon Abide Cardiologist Dr Kym Groom  Past Medical History  Diagnosis Date  . Diabetes mellitus   . Hyperlipidemia   . Hypertension   . CHF (congestive heart failure)   . Arthritis   . Anemia   . Carotid artery occlusion   . Coronary atherosclerosis   . Ischemic cardiomyopathy   . Bipolar affective disorder   . GERD (gastroesophageal reflux disease)   . Obesity   . Peripheral neuropathy   . Depression   . Osteoarthritis    Past Surgical History  Procedure Laterality Date  . Coronary artery bypass graft    . Abdominal hysterectomy  1984  . Ptca  2003    with stent of left renal artery  . Tonsillectomy      AS A CHILD  . Endarterectomy  02/18/2012    Procedure: ENDARTERECTOMY CAROTID;  Surgeon: Larina Earthly, MD;  Location: [facility] OR;  Service:  Vascular;  Laterality: Right;  Right Carotid endarterectomy with Dacron patch angioplasty with resection of internal carotid artery  . Carotid endarterectomy  02/18/12    Right CEA   Family History  Problem Relation Age of Onset  . Heart disease Brother     Heart Disease before age 29  . Glaucoma Father   . Stroke Paternal Grandmother   . Coronary artery disease Paternal Grandmother   . Cerebral aneurysm Brother   . Clotting disorder Sister    History  Substance Use Topics  . Smoking status: Never Smoker   . Smokeless tobacco: Never Used  . Alcohol Use: No   retired   OB History   Grav Para Term Preterm Abortions TAB SAB Ect Mult Living                 Review of Systems  All other systems reviewed and are negative.    Allergies  Penicillins and Protamine  Home Medications   Current Outpatient Rx  Name  Route  Sig  Dispense  Refill  . amLODipine (NORVASC) 5 MG tablet   Oral   Take 5 mg by mouth daily.          [name] aspirin 325 MG tablet   Oral   Take 325 mg by mouth daily.          . carvedilol (COREG) 25 MG tablet  Oral   Take 25 mg by mouth 2 (two) times daily with a meal.          . clopidogrel (PLAVIX) 75 MG tablet   Oral   Take 75 mg by mouth daily.          [name] ezetimibe (ZETIA) 10 MG tablet   Oral   Take 10 mg by mouth daily.          . ferrous sulfate 325 (65 FE) MG tablet   Oral   Take 325 mg by mouth daily with breakfast.          . furosemide (LASIX) 40 MG tablet   Oral   Take 40 mg by mouth daily.          . irbesartan (AVAPRO) 150 MG tablet   Oral   Take 150 mg by mouth every morning.          . isosorbide dinitrate (ISORDIL) 10 MG tablet   Oral   Take 10 mg by mouth 2 (two) times daily.         . lansoprazole (PREVACID) 15 MG capsule   Oral   Take 15 mg by mouth daily.           . mometasone (NASONEX) 50 MCG/ACT nasal spray   Nasal   Place 2 sprays into the nose daily.           . Multiple  Vitamins-Minerals (MULTIVITAL) tablet   Oral   Take 1 tablet by mouth daily.          . rosuvastatin (CRESTOR) 40 MG tablet   Oral   Take 40 mg by mouth daily.           BP 131/53  Pulse 77  Temp(Src) 98.6 F (37 C) (Oral)  Resp 16  SpO2 99%  Vital signs normal   Physical Exam  Nursing note and vitals reviewed. Constitutional: She is oriented to person, place, and time. She appears well-developed and well-nourished.  Non-toxic appearance. She does not appear ill. No distress.  HENT:  Head: Normocephalic and atraumatic.  Right Ear: External ear normal.  Left Ear: External ear normal.  Nose: Nose normal. No mucosal edema or rhinorrhea.  Mouth/Throat: Oropharynx is clear and moist and mucous membranes are normal. No dental abscesses or uvula swelling.  Eyes: Conjunctivae and EOM are normal. Pupils are equal, round, and reactive to light.  Neck: Normal range of motion and full passive range of motion without pain. Neck supple.  Cardiovascular: Normal rate, regular rhythm and normal heart sounds.  Exam reveals no gallop and no friction rub.   No murmur heard. Pulmonary/Chest: Effort normal and breath sounds normal. No respiratory distress. She has no wheezes. She has no rhonchi. She has no rales. She exhibits no tenderness and no crepitus.  Coughs at times  Abdominal: Soft. Normal appearance and bowel sounds are normal. She exhibits no distension. There is no tenderness. There is no rebound and no guarding.  Musculoskeletal: Normal range of motion. She exhibits no edema and no tenderness.  Moves all extremities well.   Neurological: She is alert and oriented to person, place, and time. She has normal strength. No cranial nerve deficit.  Skin: Skin is warm, dry and intact. No rash noted. No erythema. No pallor.  Psychiatric: She has a normal mood and affect. Her speech is normal and behavior is normal. Her mood appears not anxious.    ED Course  Procedures (including critical  care time)  Labs Review Labs Reviewed - No data to display Imaging Review Dg Chest 2 View  10/14/2013   CLINICAL DATA:  Cough  EXAM: CHEST  2 VIEW  COMPARISON:  February 12, 2012  FINDINGS: The lungs are clear. Heart is upper normal in size with normal pulmonary vascularity. No adenopathy. There is atherosclerotic change in the aorta. Patient is status post coronary artery bypass grafting. No bone lesions. There are surgical clips in the right neck region.  IMPRESSION: No edema or consolidation.   Electronically Signed   By: Bretta Bang M.D.   On: 10/14/2013 08:54    EKG Interpretation   None       MDM   1. Bronchitis     New Prescriptions   AZITHROMYCIN (ZITHROMAX) 250 MG TABLET    Take 2 po the first day then once a day for the next 4 days.  Mucinex DM OTC  Plan discharge  Devoria Albe, MD, Franz Dell, MD 10/14/13 [phone]

## 2013-10-14 NOTE — Telephone Encounter (Signed)
Pt made aware via vm.

## 2013-12-02 ENCOUNTER — Other Ambulatory Visit: Payer: Self-pay | Admitting: Vascular Surgery

## 2013-12-02 DIAGNOSIS — I6529 Occlusion and stenosis of unspecified carotid artery: Secondary | ICD-10-CM

## 2013-12-02 DIAGNOSIS — Z48812 Encounter for surgical aftercare following surgery on the circulatory system: Secondary | ICD-10-CM

## 2013-12-11 ENCOUNTER — Institutional Professional Consult (permissible substitution): Payer: Medicare Other | Admitting: Interventional Cardiology

## 2013-12-16 ENCOUNTER — Encounter: Payer: Self-pay | Admitting: Gastroenterology

## 2013-12-16 ENCOUNTER — Other Ambulatory Visit: Payer: Self-pay

## 2013-12-16 DIAGNOSIS — Z1231 Encounter for screening mammogram for malignant neoplasm of breast: Secondary | ICD-10-CM

## 2013-12-23 ENCOUNTER — Institutional Professional Consult (permissible substitution): Payer: Medicare Other | Admitting: Interventional Cardiology

## 2014-01-01 ENCOUNTER — Ambulatory Visit
Admission: RE | Admit: 2014-01-01 | Discharge: 2014-01-01 | Disposition: A | Discharge status: Home / Self Care | Payer: Self-pay | Source: Ambulatory Visit

## 2014-01-01 DIAGNOSIS — Z1231 Encounter for screening mammogram for malignant neoplasm of breast: Secondary | ICD-10-CM

## 2014-01-04 ENCOUNTER — Institutional Professional Consult (permissible substitution): Payer: Medicare Other | Admitting: Interventional Cardiology

## 2014-01-08 ENCOUNTER — Ambulatory Visit: Payer: Medicare Other | Admitting: Gastroenterology

## 2014-01-13 ENCOUNTER — Telehealth: Payer: Self-pay | Admitting: *Deleted

## 2014-01-13 ENCOUNTER — Ambulatory Visit (INDEPENDENT_AMBULATORY_CARE_PROVIDER_SITE_OTHER): Payer: Medicare Other | Admitting: Nurse Practitioner

## 2014-01-13 ENCOUNTER — Encounter: Payer: Self-pay | Admitting: Nurse Practitioner

## 2014-01-13 VITALS — BP 122/60 | HR 72 | Ht 63.0 in | Wt 175.2 lb

## 2014-01-13 DIAGNOSIS — Z1211 Encounter for screening for malignant neoplasm of colon: Secondary | ICD-10-CM | POA: Insufficient documentation

## 2014-01-13 MED ORDER — MOVIPREP 100 G PO SOLR: 1.0000 | Freq: Once | ORAL | Status: DC

## 2014-01-13 NOTE — Telephone Encounter (Signed)
01/13/2014   RE: Penny James DOB: 1951/04/29 MRN: [id number]   Dear Dr Donnetta Hutching,    We have scheduled the above patient for an endoscopic procedure. Our records show that she is on anticoagulation therapy.   Please advise as to how long the patient may come off her therapy of Plavix prior to the procedure, which is scheduled for 03-09-2014.  Please fax back/ or route the completed form to [place], [place].   Sincerely,    Hope Pigeon

## 2014-01-13 NOTE — Progress Notes (Signed)
     History of Present Illness:  Patient is a 63 year old female with multiple medical problems. She is s/p remote CABD, has a history of CHF, PVD and diabetes. Patient seen here January 2014 for a screening colonoscopy. She cancelled the procedure when need arose to have a right carotid endarterectomy for stenosis.  Patient is back today to discuss screening colonoscopy. No GI symptoms.    Current Medications, Allergies, Past Medical History, Past Surgical History, Family History and Social History were reviewed in [employer] record.  Physical Exam: General: Pleasant, well developed , black female in no acute distress Head: Normocephalic and atraumatic Eyes:  sclerae anicteric, conjunctiva pink  Lungs: Clear throughout to auscultation Heart: Regular rate and rhythm Abdomen: Soft, non distended, non-tender. No masses, no hepatomegaly. Normal bowel sounds Extremities: No edema  Neurological: Alert oriented x 4, grossly nonfocal Psychological:  Alert and cooperative. Normal mood and affect  Assessment and Recommendations:  31. 63 year old female for colon cancer screening. The risks, benefits, and alternatives to colonoscopy with possible biopsy and possible polypectomy were discussed with the patient and she consents to proceed.  We will contact Dr. Inda Merlin (prescriber) about holding Plavix prior to procedure.    2. Multiple medical problems as listed in [place]  3. Chronic antiplatelet therapy

## 2014-01-13 NOTE — Telephone Encounter (Signed)
01/13/2014   RE: Penny James DOB: 10/26/1951 MRN: [id number]   Dear Dr. Inda Merlin,    We have scheduled the above patient for an endoscopic procedure. Our records show that she is on anticoagulation therapy.   Please advise as to how long the patient may come off her therapy of Plavix prior to the procedure, which is scheduled for 03-09-2014.  Please fax back/ or route the completed form to [place], [place] at [phone].   Sincerely,    Hope Pigeon

## 2014-01-13 NOTE — Patient Instructions (Signed)
You have been scheduled for a colonoscopy with propofol. Please follow written instructions given to you at your visit today.  Please pick up your prep kit at the pharmacy within the next 1-3 days. If you use inhalers (even only as needed), please bring them with you on the day of your procedure. Your physician has requested that you go to www.startemmi.com and enter the access code given to you at your visit today. This web site gives a general overview about your procedure. However, you should still follow specific instructions given to you by our office regarding your preparation for the procedure.  You will be contacted by our office prior to your procedure for directions on holding your Plavix.  If you do not hear from our office 1 week prior to your scheduled procedure, please call [phone] to discuss.   

## 2014-01-13 NOTE — Progress Notes (Signed)
Reviewed and agree with management plan.  Veldon Wager T. Kevyn Wengert, MD FACG 

## 2014-01-15 NOTE — Telephone Encounter (Signed)
Dr. Inda Merlin sent letter back stating Dr. Donnetta Hutching needs to make the decision about the Plavix. Letter sent

## 2014-01-18 ENCOUNTER — Encounter: Payer: Self-pay | Admitting: Gastroenterology

## 2014-01-18 NOTE — Telephone Encounter (Signed)
Penny James from Dr. Donnetta Hutching called back. Requesting letter be faxed to [place]. Faxed letter.

## 2014-01-18 NOTE — Telephone Encounter (Signed)
Left message with nurse at Dr. Luther Parody office making sure he received telephone note on holding patient's Plavix. Nurse advised Dr. Donnetta Hutching is out until tomorrow.

## 2014-01-19 ENCOUNTER — Telehealth: Payer: Self-pay | Admitting: *Deleted

## 2014-01-19 NOTE — Telephone Encounter (Signed)
Penny Posner, MD at 01/19/2014 8:49 AM     Status: Signed        There is no contraindication to discontinuing Plavix for the planned endoscopy procedure      I spoke with patient and advised patient it is ok to hold Plavix for five days before procedure. Patient verbalized understanding.

## 2014-01-19 NOTE — Telephone Encounter (Signed)
There is no contraindication to discontinuing Plavix for the planned endoscopy procedure

## 2014-02-02 ENCOUNTER — Encounter: Payer: Self-pay | Admitting: General Surgery

## 2014-02-02 ENCOUNTER — Ambulatory Visit (INDEPENDENT_AMBULATORY_CARE_PROVIDER_SITE_OTHER): Payer: Medicare Other | Admitting: Cardiology

## 2014-02-02 ENCOUNTER — Encounter: Payer: Self-pay | Admitting: Cardiology

## 2014-02-02 VITALS — BP 140/60 | HR 67 | Ht 63.5 in | Wt 173.0 lb

## 2014-02-02 DIAGNOSIS — I2510 Atherosclerotic heart disease of native coronary artery without angina pectoris: Secondary | ICD-10-CM | POA: Insufficient documentation

## 2014-02-02 DIAGNOSIS — I739 Peripheral vascular disease, unspecified: Secondary | ICD-10-CM | POA: Insufficient documentation

## 2014-02-02 DIAGNOSIS — E782 Mixed hyperlipidemia: Secondary | ICD-10-CM

## 2014-02-02 DIAGNOSIS — I10 Essential (primary) hypertension: Secondary | ICD-10-CM

## 2014-02-02 DIAGNOSIS — I119 Hypertensive heart disease without heart failure: Secondary | ICD-10-CM

## 2014-02-02 DIAGNOSIS — E669 Obesity, unspecified: Secondary | ICD-10-CM | POA: Insufficient documentation

## 2014-02-02 DIAGNOSIS — Z79899 Other long term (current) drug therapy: Secondary | ICD-10-CM | POA: Insufficient documentation

## 2014-02-02 DIAGNOSIS — I6529 Occlusion and stenosis of unspecified carotid artery: Secondary | ICD-10-CM

## 2014-02-02 MED ORDER — ASPIRIN 81 MG PO TABS: 81.0000 mg | ORAL_TABLET | Freq: Every day | ORAL | Status: DC

## 2014-02-02 NOTE — Progress Notes (Signed)
[address], [place], [place]  [address] Phone: [phone] Fax:  [phone]  Date:  02/02/2014   ID:  Penny James, DOB January 04, 1951, MRN [id number]  PCP:  Marjorie Smolder, MD  Cardiologist:  Fransico Him, MD     History of Present Illness: Penny James is a 63 y.o. female with a history of HTN, ASCAD, chronic systolic CHF, ischemic DCM and dyslipidemia who presents today for followup.  She is doing well.  She denies any chest pain, SOB, DOE, orthopnea, LE edema, dizziness, palpitations or syncope.  She walks for exercise.   Wt Readings from Last 3 Encounters:  02/02/14 173 lb (78.472 kg)  01/13/14 175 lb 4 oz (79.493 kg)  02/03/13 181 lb (82.101 kg)     Past Medical History  Diagnosis Date  . Hyperlipidemia   . Hypertension   . CHF (congestive heart failure)   . Arthritis   . Anemia   . Ischemic cardiomyopathy   . Bipolar affective disorder   . GERD (gastroesophageal reflux disease)   . Obesity   . Peripheral neuropathy   . Depression   . Osteoarthritis   . 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 Bilateral carotid bruits     60-79% bilateral ICA steosis being followed by Dr Early not stent candidate, may need sx in future  . 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 CHF (congestive heart failure)   . PVD (peripheral vascular disease)     s/p PTA of left renal artery  . Coronary atherosclerosis     3 vessel s/p CABG - cath 2005 with widely patent grafts and distal disease past the grafts, cath 2010 with occluded SVG to IM and SVG to OM/PDA with PTCA of left circ and PCI of SVG to OM/PDA  . Carotid artery occlusion     60-79% bilateral ICA stenosis s/p Right CEA 2013  followed by Dr. Donnetta Hutching    Current Outpatient Prescriptions  Medication Sig Dispense Refill  . amLODipine (NORVASC) 5 MG tablet Take 5 mg by mouth daily.       [name] aspirin 325 MG  tablet Take 325 mg by mouth daily.       . carvedilol (COREG) 25 MG tablet Take 25 mg by mouth 2 (two) times daily with a meal.       . clopidogrel (PLAVIX) 75 MG tablet Take 75 mg by mouth daily.       [name] ezetimibe (ZETIA) 10 MG tablet Take 10 mg by mouth daily.       . furosemide (LASIX) 40 MG tablet Take 40 mg by mouth daily.       [name] glipiZIDE (GLUCOTROL) 10 MG tablet Take 10 mg by mouth daily before breakfast.      . irbesartan (AVAPRO) 150 MG tablet Take 150 mg by mouth every morning.       . lansoprazole (PREVACID) 15 MG capsule Take 15 mg by mouth daily.        [name] MOVIPREP 100 G SOLR Take 1 kit (200 g total) by mouth once.  1 kit  0  . Multiple Vitamins-Minerals (MULTIVITAL) tablet Take 1 tablet by mouth daily.       [name] olopatadine (PATANOL) 0.1 % ophthalmic solution 1 drop 2 (two) times daily.      . rosuvastatin (CRESTOR) 40 MG tablet Take 40 mg by mouth daily.  No current facility-administered medications for this visit.    Allergies:    Allergies  Allergen Reactions  . Penicillins Rash  . Protamine     Unknown reaction    Social History:  The patient  reports that she has never smoked. She has never used smokeless tobacco. She reports that she does not drink alcohol or use illicit drugs.   Family History:  The patient's family history includes Cerebral aneurysm in her brother; Clotting disorder in her sister; Coronary artery disease in her paternal grandmother; Glaucoma in her father; Heart disease in her brother; Stroke in her paternal grandmother.   ROS:  Please see the history of present illness.      All other systems reviewed and negative.   PHYSICAL EXAM: VS:  BP 140/60  Pulse 67  Ht 5' 3.5" (1.613 m)  Wt 173 lb (78.472 kg)  BMI 30.16 kg/m2 Well nourished, well developed, in no acute distress HEENT: normal Neck: no JVD Cardiac:  normal S1, S2; RRR; no murmur Lungs:  clear to auscultation bilaterally, no wheezing, rhonchi or rales Abd: soft, nontender, no  hepatomegaly Ext: no edema Skin: warm and dry Neuro:  CNs 2-12 intact, no focal abnormalities noted  EKG:     NSR at 67bpm with LVH by voltage, nonspecific IVCD  ASSESSMENT AND PLAN:  1. ASCAD with no angina - cotinue ASA/Plavix - decrease ASA to 49m daily 2. HTN well controlled - continue Carvedilol/amlodipine/Avapro 3. Dyslipidemia - continue Crestor/Zetia - She will see her PCP next month for PE and lipids 4. Bilateral carotid artery stenosis followed by Dr. EDonnetta Hutching Followup with me in 6 months  Signed, TFransico Him MD 02/02/2014 11:36 AM

## 2014-02-02 NOTE — Patient Instructions (Signed)
Your physician has recommended you make the following change in your medication: 1. Decrease Aspirin to 81 MG 1 tablet daily  Your physician wants you to follow-up in: 6 months with Dr Mallie Snooks will receive a reminder letter in the mail two months in advance. If you don't receive a letter, please call our office to schedule the follow-up appointment.

## 2014-02-09 ENCOUNTER — Other Ambulatory Visit (HOSPITAL_COMMUNITY): Payer: Medicare Other

## 2014-02-09 ENCOUNTER — Ambulatory Visit: Payer: Medicare Other | Admitting: Vascular Surgery

## 2014-03-09 ENCOUNTER — Telehealth: Payer: Self-pay | Admitting: Gastroenterology

## 2014-03-09 ENCOUNTER — Encounter: Payer: Medicare Other | Admitting: Gastroenterology

## 2014-03-09 NOTE — Telephone Encounter (Signed)
Yes, charge. 

## 2014-03-10 ENCOUNTER — Telehealth: Payer: Self-pay | Admitting: Cardiology

## 2014-03-10 ENCOUNTER — Telehealth: Payer: Self-pay

## 2014-03-10 NOTE — Telephone Encounter (Signed)
OK to refill Amlodipine/Coreg/Plavix/Zetia/Lasix and Crestor.  All other meds she needs to get refilled by a new PCP or go to Urgent Care

## 2014-03-10 NOTE — Telephone Encounter (Signed)
What meds does she want refilled

## 2014-03-10 NOTE — Telephone Encounter (Signed)
error 

## 2014-03-10 NOTE — Telephone Encounter (Signed)
She wants ALL meds refilled.

## 2014-03-10 NOTE — Telephone Encounter (Signed)
OK to refill Avapro as well

## 2014-03-11 ENCOUNTER — Telehealth: Payer: Self-pay | Admitting: Cardiology

## 2014-03-11 ENCOUNTER — Other Ambulatory Visit: Payer: Self-pay

## 2014-03-11 ENCOUNTER — Other Ambulatory Visit: Payer: Self-pay | Admitting: *Deleted

## 2014-03-11 MED ORDER — CLOPIDOGREL BISULFATE 75 MG PO TABS: 75.0000 mg | ORAL_TABLET | Freq: Every day | ORAL | Status: DC

## 2014-03-11 MED ORDER — ROSUVASTATIN CALCIUM 40 MG PO TABS: 40.0000 mg | ORAL_TABLET | Freq: Every day | ORAL | Status: DC

## 2014-03-11 MED ORDER — EZETIMIBE 10 MG PO TABS: 10.0000 mg | ORAL_TABLET | Freq: Every day | ORAL | Status: DC

## 2014-03-11 MED ORDER — CARVEDILOL 25 MG PO TABS: 25.0000 mg | ORAL_TABLET | Freq: Two times a day (BID) | ORAL | Status: DC

## 2014-03-11 MED ORDER — AMLODIPINE BESYLATE 5 MG PO TABS: 5.0000 mg | ORAL_TABLET | Freq: Every day | ORAL | Status: DC

## 2014-03-11 MED ORDER — FUROSEMIDE 40 MG PO TABS: 40.0000 mg | ORAL_TABLET | Freq: Every day | ORAL | Status: DC

## 2014-03-11 MED ORDER — IRBESARTAN 150 MG PO TABS: 150.0000 mg | ORAL_TABLET | Freq: Every morning | ORAL | Status: DC

## 2014-03-11 NOTE — Telephone Encounter (Signed)
The ladies in Refill are sending in the prescriptions now for pt.

## 2014-03-11 NOTE — Telephone Encounter (Signed)
To [facility] to refill

## 2014-03-11 NOTE — Telephone Encounter (Signed)
New problem   Pt need refill on her medication. Please call pt she want to speak to nurse.

## 2014-03-11 NOTE — Telephone Encounter (Signed)
Called pt and made aware that we could only fill heart related medications. Also that we filled the medications we could at her pharmacy for her.

## 2014-03-15 ENCOUNTER — Encounter: Payer: Self-pay | Admitting: Family

## 2014-03-16 ENCOUNTER — Other Ambulatory Visit (HOSPITAL_COMMUNITY): Payer: Medicare Other

## 2014-03-16 ENCOUNTER — Ambulatory Visit: Payer: Medicare Other | Admitting: Family

## 2014-04-01 ENCOUNTER — Encounter: Payer: Self-pay | Admitting: Family

## 2014-04-02 ENCOUNTER — Ambulatory Visit: Payer: Medicare Other | Admitting: Family

## 2014-04-02 ENCOUNTER — Other Ambulatory Visit (HOSPITAL_COMMUNITY): Payer: Medicare Other

## 2014-04-12 ENCOUNTER — Encounter (HOSPITAL_COMMUNITY): Payer: Self-pay | Admitting: Emergency Medicine

## 2014-04-12 ENCOUNTER — Inpatient Hospital Stay (HOSPITAL_COMMUNITY)
Admission: EM | Admit: 2014-04-12 | Discharge: 2014-04-14 | DRG: 638 | Disposition: A | Discharge status: Home / Self Care | Payer: Medicare Other | Attending: Internal Medicine | Admitting: Internal Medicine

## 2014-04-12 DIAGNOSIS — T502X5A Adverse effect of carbonic-anhydrase inhibitors, benzothiadiazides and other diuretics, initial encounter: Secondary | ICD-10-CM | POA: Diagnosis present

## 2014-04-12 DIAGNOSIS — IMO0001 Reserved for inherently not codable concepts without codable children: Secondary | ICD-10-CM

## 2014-04-12 DIAGNOSIS — E782 Mixed hyperlipidemia: Secondary | ICD-10-CM

## 2014-04-12 DIAGNOSIS — Z951 Presence of aortocoronary bypass graft: Secondary | ICD-10-CM

## 2014-04-12 DIAGNOSIS — Z823 Family history of stroke: Secondary | ICD-10-CM

## 2014-04-12 DIAGNOSIS — F319 Bipolar disorder, unspecified: Secondary | ICD-10-CM | POA: Diagnosis present

## 2014-04-12 DIAGNOSIS — K219 Gastro-esophageal reflux disease without esophagitis: Secondary | ICD-10-CM | POA: Diagnosis present

## 2014-04-12 DIAGNOSIS — I739 Peripheral vascular disease, unspecified: Secondary | ICD-10-CM | POA: Diagnosis present

## 2014-04-12 DIAGNOSIS — G609 Hereditary and idiopathic neuropathy, unspecified: Secondary | ICD-10-CM | POA: Diagnosis present

## 2014-04-12 DIAGNOSIS — H409 Unspecified glaucoma: Secondary | ICD-10-CM | POA: Diagnosis present

## 2014-04-12 DIAGNOSIS — I5032 Chronic diastolic (congestive) heart failure: Secondary | ICD-10-CM | POA: Diagnosis present

## 2014-04-12 DIAGNOSIS — Z7982 Long term (current) use of aspirin: Secondary | ICD-10-CM

## 2014-04-12 DIAGNOSIS — E1151 Type 2 diabetes mellitus with diabetic peripheral angiopathy without gangrene: Secondary | ICD-10-CM | POA: Diagnosis present

## 2014-04-12 DIAGNOSIS — E669 Obesity, unspecified: Secondary | ICD-10-CM | POA: Diagnosis present

## 2014-04-12 DIAGNOSIS — Z6829 Body mass index (BMI) 29.0-29.9, adult: Secondary | ICD-10-CM

## 2014-04-12 DIAGNOSIS — E1149 Type 2 diabetes mellitus with other diabetic neurological complication: Secondary | ICD-10-CM | POA: Diagnosis present

## 2014-04-12 DIAGNOSIS — I2589 Other forms of chronic ischemic heart disease: Secondary | ICD-10-CM | POA: Diagnosis present

## 2014-04-12 DIAGNOSIS — IMO0002 Reserved for concepts with insufficient information to code with codable children: Principal | ICD-10-CM | POA: Diagnosis present

## 2014-04-12 DIAGNOSIS — E1142 Type 2 diabetes mellitus with diabetic polyneuropathy: Secondary | ICD-10-CM | POA: Diagnosis present

## 2014-04-12 DIAGNOSIS — I2510 Atherosclerotic heart disease of native coronary artery without angina pectoris: Secondary | ICD-10-CM | POA: Diagnosis present

## 2014-04-12 DIAGNOSIS — Z880 Allergy status to penicillin: Secondary | ICD-10-CM

## 2014-04-12 DIAGNOSIS — Z8249 Family history of ischemic heart disease and other diseases of the circulatory system: Secondary | ICD-10-CM

## 2014-04-12 DIAGNOSIS — E162 Hypoglycemia, unspecified: Secondary | ICD-10-CM | POA: Diagnosis present

## 2014-04-12 DIAGNOSIS — I10 Essential (primary) hypertension: Secondary | ICD-10-CM | POA: Diagnosis present

## 2014-04-12 DIAGNOSIS — E876 Hypokalemia: Secondary | ICD-10-CM | POA: Diagnosis present

## 2014-04-12 DIAGNOSIS — E1169 Type 2 diabetes mellitus with other specified complication: Principal | ICD-10-CM

## 2014-04-12 DIAGNOSIS — E785 Hyperlipidemia, unspecified: Secondary | ICD-10-CM | POA: Diagnosis present

## 2014-04-12 DIAGNOSIS — E1165 Type 2 diabetes mellitus with hyperglycemia: Principal | ICD-10-CM | POA: Diagnosis present

## 2014-04-12 DIAGNOSIS — M1990 Unspecified osteoarthritis, unspecified site: Secondary | ICD-10-CM | POA: Diagnosis present

## 2014-04-12 DIAGNOSIS — I509 Heart failure, unspecified: Secondary | ICD-10-CM

## 2014-04-12 DIAGNOSIS — Z7902 Long term (current) use of antithrombotics/antiplatelets: Secondary | ICD-10-CM

## 2014-04-12 LAB — GLUCOSE, CAPILLARY
GLUCOSE-CAPILLARY: 64 mg/dL — AB (ref 70–99)
GLUCOSE-CAPILLARY: 60 mg/dL — AB (ref 70–99)
Glucose-Capillary: 73 mg/dL (ref 70–99)
Glucose-Capillary: 94 mg/dL (ref 70–99)

## 2014-04-12 LAB — CBC
HCT: 33.2 % — ABNORMAL LOW (ref 36.0–46.0)
HCT: 33.2 % — ABNORMAL LOW (ref 36.0–46.0)
Hemoglobin: 11.2 g/dL — ABNORMAL LOW (ref 12.0–15.0)
Hemoglobin: 11.3 g/dL — ABNORMAL LOW (ref 12.0–15.0)
MCH: 29 pg (ref 26.0–34.0)
MCH: 28.6 pg (ref 26.0–34.0)
MCHC: 33.7 g/dL (ref 30.0–36.0)
MCHC: 34 g/dL (ref 30.0–36.0)
MCV: 84.9 fL (ref 78.0–100.0)
MCV: 85.3 fL (ref 78.0–100.0)
PLATELETS: 170 10*3/uL (ref 150–400)
Platelets: 182 10*3/uL (ref 150–400)
RBC: 3.89 MIL/uL (ref 3.87–5.11)
RBC: 3.91 MIL/uL (ref 3.87–5.11)
RDW: 13.5 % (ref 11.5–15.5)
RDW: 13.5 % (ref 11.5–15.5)
WBC: 5.2 10*3/uL (ref 4.0–10.5)
WBC: 5.3 10*3/uL (ref 4.0–10.5)

## 2014-04-12 LAB — URINALYSIS, ROUTINE W REFLEX MICROSCOPIC
BILIRUBIN URINE: NEGATIVE
GLUCOSE, UA: NEGATIVE mg/dL
HGB URINE DIPSTICK: NEGATIVE
Ketones, ur: NEGATIVE mg/dL
Nitrite: NEGATIVE
PROTEIN: NEGATIVE mg/dL
Specific Gravity, Urine: 1.009 (ref 1.005–1.030)
Urobilinogen, UA: 1 mg/dL (ref 0.0–1.0)
pH: 6 (ref 5.0–8.0)

## 2014-04-12 LAB — COMPREHENSIVE METABOLIC PANEL
ALBUMIN: 3.8 g/dL (ref 3.5–5.2)
ALK PHOS: 67 U/L (ref 39–117)
ALT: 23 U/L (ref 0–35)
AST: 27 U/L (ref 0–37)
BILIRUBIN TOTAL: 0.4 mg/dL (ref 0.3–1.2)
BUN: 14 mg/dL (ref 6–23)
CHLORIDE: 99 meq/L (ref 96–112)
CO2: 26 meq/L (ref 19–32)
Calcium: 9.2 mg/dL (ref 8.4–10.5)
Creatinine, Ser: 0.82 mg/dL (ref 0.50–1.10)
GFR calc Af Amer: 85 mL/min — ABNORMAL LOW (ref >90)
GFR, EST NON AFRICAN AMERICAN: 73 mL/min — AB (ref >90)
Glucose, Bld: 54 mg/dL — ABNORMAL LOW (ref 70–99)
POTASSIUM: 3.3 meq/L — AB (ref 3.7–5.3)
Sodium: 136 mEq/L — ABNORMAL LOW (ref 137–147)
Total Protein: 6.8 g/dL (ref 6.0–8.3)

## 2014-04-12 LAB — URINE MICROSCOPIC-ADD ON

## 2014-04-12 LAB — CBG MONITORING, ED
Glucose-Capillary: 59 mg/dL — ABNORMAL LOW (ref 70–99)
Glucose-Capillary: 63 mg/dL — ABNORMAL LOW (ref 70–99)
Glucose-Capillary: 115 mg/dL — ABNORMAL HIGH (ref 70–99)

## 2014-04-12 MED ORDER — GLUCAGON HCL RDNA (DIAGNOSTIC) 1 MG IJ SOLR
1.0000 mg | Freq: Once | INTRAMUSCULAR | Status: AC
  Administered 2014-04-12: 1 mg via INTRAVENOUS
  Filled 2014-04-12: qty 1

## 2014-04-12 MED ORDER — ENOXAPARIN SODIUM 40 MG/0.4ML ~~LOC~~ SOLN
40.0000 mg | SUBCUTANEOUS | Status: DC
  Administered 2014-04-12 – 2014-04-13 (×2): 40 mg via SUBCUTANEOUS
  Filled 2014-04-12 (×3): qty 0.4

## 2014-04-12 MED ORDER — IRBESARTAN 150 MG PO TABS
150.0000 mg | ORAL_TABLET | Freq: Every morning | ORAL | Status: DC
  Administered 2014-04-13 – 2014-04-14 (×2): 150 mg via ORAL
  Filled 2014-04-12 (×2): qty 1

## 2014-04-12 MED ORDER — EZETIMIBE 10 MG PO TABS: 10.0000 mg | ORAL_TABLET | Freq: Every day | ORAL | Status: DC

## 2014-04-12 MED ORDER — ATORVASTATIN CALCIUM 80 MG PO TABS
80.0000 mg | ORAL_TABLET | Freq: Every day | ORAL | Status: DC
Start: 2014-04-13 — End: 2014-04-14
  Administered 2014-04-13: 80 mg via ORAL
  Filled 2014-04-12 (×2): qty 1

## 2014-04-12 MED ORDER — ACETAMINOPHEN 325 MG PO TABS: 650.0000 mg | ORAL_TABLET | Freq: Four times a day (QID) | ORAL | Status: DC | PRN

## 2014-04-12 MED ORDER — FUROSEMIDE 40 MG PO TABS
40.0000 mg | ORAL_TABLET | Freq: Every day | ORAL | Status: DC
  Filled 2014-04-12: qty 1

## 2014-04-12 MED ORDER — PANTOPRAZOLE SODIUM 40 MG PO TBEC: 40.0000 mg | DELAYED_RELEASE_TABLET | Freq: Every day | ORAL | Status: DC

## 2014-04-12 MED ORDER — POTASSIUM CHLORIDE CRYS ER 20 MEQ PO TBCR
40.0000 meq | EXTENDED_RELEASE_TABLET | ORAL | Status: AC
  Administered 2014-04-12 (×2): 40 meq via ORAL
  Filled 2014-04-12 (×2): qty 2

## 2014-04-12 MED ORDER — ADULT MULTIVITAMIN W/MINERALS CH
1.0000 | ORAL_TABLET | Freq: Every day | ORAL | Status: DC
  Administered 2014-04-13 – 2014-04-14 (×2): 1 via ORAL
  Filled 2014-04-12 (×2): qty 1

## 2014-04-12 MED ORDER — ONDANSETRON HCL 4 MG/2ML IJ SOLN
4.0000 mg | Freq: Four times a day (QID) | INTRAMUSCULAR | Status: DC | PRN
Start: 2014-04-12 — End: 2014-04-14

## 2014-04-12 MED ORDER — DEXTROSE-NACL 5-0.9 % IV SOLN
INTRAVENOUS | Status: DC
  Administered 2014-04-12 – 2014-04-13 (×2): via INTRAVENOUS

## 2014-04-12 MED ORDER — CARVEDILOL 25 MG PO TABS
25.0000 mg | ORAL_TABLET | Freq: Two times a day (BID) | ORAL | Status: DC
  Administered 2014-04-12: 25 mg via ORAL
  Filled 2014-04-12 (×2): qty 1

## 2014-04-12 MED ORDER — OLOPATADINE HCL 0.1 % OP SOLN
1.0000 [drp] | Freq: Two times a day (BID) | OPHTHALMIC | Status: DC
  Administered 2014-04-12 – 2014-04-13 (×3): 1 [drp] via OPHTHALMIC
  Filled 2014-04-12 (×3): qty 5

## 2014-04-12 MED ORDER — GLUCOSE 40 % PO GEL
1.0000 | Freq: Once | ORAL | Status: AC
  Administered 2014-04-12: 37.5 g via ORAL
  Filled 2014-04-12: qty 1

## 2014-04-12 MED ORDER — SODIUM CHLORIDE 0.9 % IJ SOLN
3.0000 mL | Freq: Two times a day (BID) | INTRAMUSCULAR | Status: DC
  Administered 2014-04-12 – 2014-04-14 (×4): 3 mL via INTRAVENOUS

## 2014-04-12 MED ORDER — AMLODIPINE BESYLATE 5 MG PO TABS
5.0000 mg | ORAL_TABLET | Freq: Every day | ORAL | Status: DC
  Administered 2014-04-13 – 2014-04-14 (×2): 5 mg via ORAL
  Filled 2014-04-12 (×2): qty 1

## 2014-04-12 MED ORDER — CLOPIDOGREL BISULFATE 75 MG PO TABS
75.0000 mg | ORAL_TABLET | Freq: Every day | ORAL | Status: DC
  Filled 2014-04-12: qty 1

## 2014-04-12 MED ORDER — ACETAMINOPHEN 650 MG RE SUPP: 650.0000 mg | Freq: Four times a day (QID) | RECTAL | Status: DC | PRN

## 2014-04-12 MED ORDER — ASPIRIN 81 MG PO CHEW
162.0000 mg | CHEWABLE_TABLET | Freq: Every day | ORAL | Status: DC
  Administered 2014-04-13 – 2014-04-14 (×2): 162 mg via ORAL
  Filled 2014-04-12: qty 2

## 2014-04-12 MED ORDER — EZETIMIBE 10 MG PO TABS
10.0000 mg | ORAL_TABLET | Freq: Every day | ORAL | Status: DC
  Administered 2014-04-12 – 2014-04-13 (×2): 10 mg via ORAL
  Filled 2014-04-12 (×3): qty 1

## 2014-04-12 MED ORDER — ONDANSETRON HCL 4 MG PO TABS: 4.0000 mg | ORAL_TABLET | Freq: Four times a day (QID) | ORAL | Status: DC | PRN

## 2014-04-12 MED ORDER — MULTIVITAL PO TABS: 1.0000 | ORAL_TABLET | Freq: Every day | ORAL | Status: DC

## 2014-04-12 NOTE — ED Notes (Signed)
Checked CBG again and noted it at 52. Orders for glucagon given by PA Carlota Raspberry. Admitting MD contacted about pt CBG status. Updated 3E RN.

## 2014-04-12 NOTE — ED Notes (Signed)
Pt states that she has had low blood sugar for several days. States that she had the paramedics comeout last night because her husband "couldnt wake her up" pt states that her CBG was in the 40's. States that they started an IV and gave her oxygen. Pt did not want to be transported at that time. Pt states that CBG has not been above 53 today. Pt stats that she is dizzy.

## 2014-04-12 NOTE — ED Notes (Signed)
Pt states that she has not taken any of her medications for diabetes today. Pt states that she has eaten but it is not bringing her sugar up

## 2014-04-12 NOTE — ED Notes (Signed)
CBG 64

## 2014-04-12 NOTE — ED Provider Notes (Signed)
  Face-to-face evaluation   History: She is having periods of intermittent dizziness and difficult to control blood sugars, both low and high. She started taking glipizide because of that. She does not take any other medications for diabetes  Physical exam: Alert, cooperative. Heart regular rate and rhythm. No murmur. Lungs clear to patient. No dysarthria, aphasia or nystagmus. Strength normal bilaterally  Medical screening examination/treatment/procedure(s) were conducted as a shared visit with non-physician practitioner(s) and myself.  I personally evaluated the patient during the encounter  Richarda Blade, MD 04/14/14 1133

## 2014-04-12 NOTE — ED Notes (Signed)
CBG: 115 

## 2014-04-12 NOTE — H&P (Signed)
[facility] History and Physical  KEYARI KLEEMAN JXB:[id number] DOB: 04/04/1951 DOA: 04/12/2014  Referring physician: EDP PCP: No PCP Per Patient   Chief Complaint: Low blood sugars  HPI: CHANON LONEY is a 63 y.o. 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 She states that yesterday evening her husband was having a hard time waking her up, and when her blood glucose was checked it was 33>> EMS was called and following treatment her blood glucose came up and she did not want to come to the hospital then. She relates that her today her blood sugar has been in the 50s and arm even after eating and rechecking her blood sugars remained low, she admits to dizziness and her husband report some confusion as well and so she came to the ED. In the ED she was given glucose and glucagon administered x2 and hypoglycemia was recurrent and so she is admitted for further evaluation and management. She states that she last took her Glucotrol XL yesterday. She denies chest pain, shortness of breath, leg swelling, dysuria, fevers, nausea or vomiting, melena and no hematochezia. She admits to having diarrhea last week but has not had any further in the past couple of days.    Review of Systems The patient denies anorexia, fever, weight loss,, vision loss, decreased hearing, hoarseness, chest pain, syncope, dyspnea on exertion, peripheral edema, balance deficits, hemoptysis, abdominal pain, melena, hematochezia, severe indigestion/heartburn, hematuria, incontinence, muscle weakness, suspicious skin lesions, transient blindness, difficulty walking, depression, unusual weight change, abnormal bleeding,   Past Medical History  Diagnosis Date  . Hyperlipidemia   . Hypertension   . CHF (congestive heart failure)   . Arthritis   . Anemia   .  Ischemic cardiomyopathy   . Bipolar affective disorder   . GERD (gastroesophageal reflux disease)   . Obesity   . Peripheral neuropathy   . Depression   . Osteoarthritis   . 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 Bilateral carotid bruits     60-79% bilateral ICA steosis being followed by Dr Early not stent candidate, may need sx in future  . 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 CHF (congestive heart failure)   . PVD (peripheral vascular disease)     s/p PTA of left renal artery  . Coronary atherosclerosis     3 vessel s/p CABG - cath 2005 with widely patent grafts and distal disease past the grafts, cath 2010 with occluded SVG to IM and SVG to OM/PDA with PTCA of left circ and PCI of SVG to OM/PDA  . Carotid artery occlusion     60-79% bilateral ICA stenosis s/p Right CEA 2013  followed by Dr. Donnetta Hutching   Past Surgical History  Procedure Laterality Date  . Abdominal hysterectomy  1984  . Ptca  2003    with stent of left renal artery  . Tonsillectomy      AS A CHILD  . Endarterectomy  02/18/2012    Procedure: ENDARTERECTOMY CAROTID;  Surgeon: Rosetta Posner, MD;  Location: [facility] OR;  Service: Vascular;  Laterality: Right;  Right Carotid endarterectomy with Dacron patch angioplasty with resection of internal carotid artery  . Carotid endarterectomy  02/18/12    Right CEA  . Coronary artery bypass graft  3 vessel coronary disease S?P CABG with widely patent grafts by cath in 2005 and distal disease past the graft insertion sites.  . Cardiac catheterization  03/25/09    Occluded SVG to intermediate and SVG to OM/posterior descending artery with PTCA left circ 6/10 s/p PTCA stent in SVG to OM/PDA, 07/2002 s/p PCI of left circ 03/2009  . Carotid endarterectomy Right 2013   Social History:  reports that she has never smoked. She has never used smokeless tobacco. She reports that she does  not drink alcohol or use illicit drugs.  Allergies  Allergen Reactions  . Penicillins Rash  . Protamine     Unknown reaction    Family History  Problem Relation Age of Onset  . Heart disease Brother     Heart Disease before age 81  . Glaucoma Father   . Stroke Paternal Grandmother   . Coronary artery disease Paternal Grandmother   . Cerebral aneurysm Brother   . Clotting disorder Sister      Prior to Admission medications   Medication Sig Start Date End Date Taking? Authorizing Provider  amLODipine (NORVASC) 5 MG tablet Take 1 tablet (5 mg total) by mouth daily. 03/11/14  Yes Sueanne Margarita, MD  carvedilol (COREG) 25 MG tablet Take 1 tablet (25 mg total) by mouth 2 (two) times daily with a meal. 03/11/14  Yes Sueanne Margarita, MD  clopidogrel (PLAVIX) 75 MG tablet Take 1 tablet (75 mg total) by mouth daily. 03/11/14  Yes Sueanne Margarita, MD  ezetimibe (ZETIA) 10 MG tablet Take 1 tablet (10 mg total) by mouth daily. 03/11/14  Yes Sueanne Margarita, MD  furosemide (LASIX) 40 MG tablet Take 1 tablet (40 mg total) by mouth daily. 03/11/14  Yes Sueanne Margarita, MD  glipiZIDE (GLUCOTROL) 10 MG tablet Take 10 mg by mouth daily before breakfast.   Yes Historical Provider, MD  irbesartan (AVAPRO) 150 MG tablet Take 1 tablet (150 mg total) by mouth every morning. 03/11/14  Yes Sueanne Margarita, MD  lansoprazole (PREVACID) 15 MG capsule Take 15 mg by mouth daily.     Yes Historical Provider, MD  Multiple Vitamins-Minerals (MULTIVITAL) tablet Take 1 tablet by mouth daily.    Yes Historical Provider, MD  olopatadine (PATANOL) 0.1 % ophthalmic solution 1 drop 2 (two) times daily.   Yes Historical Provider, MD  rosuvastatin (CRESTOR) 40 MG tablet Take 1 tablet (40 mg total) by mouth daily. 03/11/14  Yes Sueanne Margarita, MD  MOVIPREP 100 G SOLR Take 1 kit (200 g total) by mouth once. 01/13/14   Willia Craze, NP   Physical Exam: Filed Vitals:   04/12/14 1906  BP: 144/56  Pulse: 76  Temp: 98.7 F (37.1 C)   Resp: 18    BP 144/56  Pulse 76  Temp(Src) 98.7 F (37.1 C) (Oral)  Resp 18  Ht 5' 3"  (1.6 m)  Wt 75.66 kg (166 lb 12.8 oz)  BMI 29.55 kg/m2  SpO2 100% Constitutional: Vital signs reviewed.  Patient is a well-developed and well-nourished in no acute distress and cooperative with exam. Alert and oriented x3.  Head: Normocephalic and atraumatic Mouth: no erythema or exudates, MMM Eyes: PERRL, EOMI, conjunctivae normal, No scleral icterus.  Neck: Supple, Trachea midline normal ROM, No JVD, mass, thyromegaly, or carotid bruit present.  Cardiovascular: RRR, S1 normal, S2 normal, no MRG, pulses symmetric and intact bilaterally Pulmonary/Chest: normal respiratory effort, CTAB, no wheezes, rales, or rhonchi Abdominal: Soft. Non-tender, non-distended, bowel sounds  are normal, no masses, organomegaly, or guarding present.  GU: no CVA tenderness  extremities: No cyanosis and no edema  Hematology: no cervical, inginal, or axillary adenopathy.  Neurological: A&O x3, Strength is normal and symmetric bilaterally, cranial nerve II-XII are grossly intact, no focal motor deficit, sensory intact to light touch bilaterally.  Skin: Warm, dry and intact. No rash, cyanosis, or clubbing.  Psychiatric: Normal mood and affect. speech and behavior is normal.               Labs on Admission:  Basic Metabolic Panel:  Recent Labs Lab 04/12/14 1552  NA 136*  K 3.3*  CL 99  CO2 26  GLUCOSE 54*  BUN 14  CREATININE 0.82  CALCIUM 9.2   Liver Function Tests:  Recent Labs Lab 04/12/14 1552  AST 27  ALT 23  ALKPHOS 67  BILITOT 0.4  PROT 6.8  ALBUMIN 3.8   No results found for this basename: LIPASE, AMYLASE,  in the last 168 hours No results found for this basename: AMMONIA,  in the last 168 hours CBC:  Recent Labs Lab 04/12/14 1552  WBC 5.2  5.3  HGB 11.2*  11.3*  HCT 33.2*  33.2*  MCV 84.9  85.3  PLT 170  182   Cardiac Enzymes: No results found for this basename: CKTOTAL,  CKMB, CKMBINDEX, TROPONINI,  in the last 168 hours  BNP (last 3 results) No results found for this basename: PROBNP,  in the last 8760 hours CBG:  Recent Labs Lab 04/12/14 1546 04/12/14 1629 04/12/14 1716 04/12/14 1902  GLUCAP 59* 63* 115* 64*    Radiological Exams on Admission: No results found.  EKG: Independently reviewed. No sinus rhythm at 76 with no acute ischemic changes  Assessment/Plan Active Problems: Hypoglycemia -Likely secondary to Glucotrol XL, will hold -Monitor Accu-Cheks, Hypoglycemia protocol as ordered -Will start on D5(monitor fluid status as with history of CHF) given recurrence in after glucose and 2 doses of glucagon in ED -Her UA has small leukocytes but she is asymptomatic, we'll obtain urine culture follow and further treat accordingly pending results  Type II or unspecified type diabetes mellitus without mention of complication, uncontrolled -Holding oral hypoglycemic secondary to above, follow and consider changing to a alternate hypoglycemic agent given the high incidence of hypoglycemia with Glucotrol XL esp in elderly.   Hypokalemia -Secondary to diuretic -Replace K. History of CAD/CABG -She is chest pain free, continue outpatient medications   Benign essential HTN -Continue outpatient medications   CHF (congestive heart failure) -Compensated, continue outpatient medications including Lasix -Monitor strict is and os daily weights and further manage accordingly     Code Status: Full Family Communication: Husband at bedside Disposition Plan: Admit to telemetry  Time spent:   [facility] Pager [phone]

## 2014-04-12 NOTE — ED Provider Notes (Signed)
CSN: [id number]     Arrival date & time 04/12/14  1522 History   First MD Initiated Contact with Patient 04/12/14 1554     Chief Complaint  Patient presents with  . Hypoglycemia     (Consider location/radiation/quality/duration/timing/severity/associated sxs/prior Treatment) HPI  Patient presents to the ED with complaints of hypogylcemia that has been persisting for the past week. She denies taking her home medications for diabetes (glucotrol) and eating a significant amount of food to bring it up. Has not had sugar or high carb foods. Is unsure of why this is happening and denies this happening in the past. She called EMS this morning after her husband was unable to arouse her, reportedly her glucose was noted to be in the 30's. She has given "IV glucose" per patient and then became significantly improved. She was unable to go to the hospital because she was care taking for her 3 grand kids.  This evening after noticing her blood sugar dropped again she decided to come to the ER. She has been dizzy, clammy and feeling weaker than normal. She reports recently changing PCP's and is supposed to be getting a new one in a new practice.  Past Medical History  Diagnosis Date  . Hyperlipidemia   . Hypertension   . CHF (congestive heart failure)   . Arthritis   . Anemia   . Ischemic cardiomyopathy   . Bipolar affective disorder   . GERD (gastroesophageal reflux disease)   . Obesity   . Peripheral neuropathy   . Depression   . Osteoarthritis   . 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 Bilateral carotid bruits     60-79% bilateral ICA steosis being followed by Dr Early not stent candidate, may need sx in future  . 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 CHF (congestive heart failure)   . PVD (peripheral vascular disease)     s/p PTA of left renal artery  . Coronary atherosclerosis    3 vessel s/p CABG - cath 2005 with widely patent grafts and distal disease past the grafts, cath 2010 with occluded SVG to IM and SVG to OM/PDA with PTCA of left circ and PCI of SVG to OM/PDA  . Carotid artery occlusion     60-79% bilateral ICA stenosis s/p Right CEA 2013  followed by Dr. Donnetta Hutching   Past Surgical History  Procedure Laterality Date  . Abdominal hysterectomy  1984  . Ptca  2003    with stent of left renal artery  . Tonsillectomy      AS A CHILD  . Endarterectomy  02/18/2012    Procedure: ENDARTERECTOMY CAROTID;  Surgeon: Rosetta Posner, MD;  Location: [facility] OR;  Service: Vascular;  Laterality: Right;  Right Carotid endarterectomy with Dacron patch angioplasty with resection of internal carotid artery  . Carotid endarterectomy  02/18/12    Right CEA  . Coronary artery bypass graft      3 vessel coronary disease S?P CABG with widely patent grafts by cath in 2005 and distal disease past the graft insertion sites.  . Cardiac catheterization  03/25/09    Occluded SVG to intermediate and SVG to OM/posterior descending artery with PTCA left circ 6/10 s/p PTCA stent in SVG to OM/PDA, 07/2002 s/p PCI of left circ 03/2009  . Carotid endarterectomy Right 2013   Family History  Problem Relation Age of Onset  .  Heart disease Brother     Heart Disease before age 2  . Glaucoma Father   . Stroke Paternal Grandmother   . Coronary artery disease Paternal Grandmother   . Cerebral aneurysm Brother   . Clotting disorder Sister    History  Substance Use Topics  . Smoking status: Never Smoker   . Smokeless tobacco: Never Used  . Alcohol Use: No   OB History   Grav Para Term Preterm Abortions TAB SAB Ect Mult Living                 Review of Systems   Review of Systems  Gen: no weight loss, fevers, chills, night sweats, + diaphoresis Eyes: no discharge or drainage, no occular pain or visual changes  Nose: no epistaxis or rhinorrhea  Mouth: no dental pain, no sore throat  Neck: no neck  pain  Lungs:No wheezing, coughing or hemoptysis CV: no chest pain, palpitations, dependent edema or orthopnea  Abd: no abdominal pain, nausea, vomiting, diarrhea GU: no dysuria or gross hematuria  MSK:  No muscle weakness or pain Neuro: no headache, no focal neurologic deficits,+ dizziness and generalized weakness. Skin: no rash or wounds Psyche: no complaints    Allergies  Penicillins and Protamine  Home Medications   Prior to Admission medications   Medication Sig Start Date End Date Taking? Authorizing Provider  amLODipine (NORVASC) 5 MG tablet Take 1 tablet (5 mg total) by mouth daily. 03/11/14  Yes Sueanne Margarita, MD  aspirin 81 MG tablet Take 1 tablet (81 mg total) by mouth daily. 02/02/14  Yes Sueanne Margarita, MD  carvedilol (COREG) 25 MG tablet Take 1 tablet (25 mg total) by mouth 2 (two) times daily with a meal. 03/11/14  Yes Sueanne Margarita, MD  clopidogrel (PLAVIX) 75 MG tablet Take 1 tablet (75 mg total) by mouth daily. 03/11/14  Yes Sueanne Margarita, MD  ezetimibe (ZETIA) 10 MG tablet Take 1 tablet (10 mg total) by mouth daily. 03/11/14  Yes Sueanne Margarita, MD  furosemide (LASIX) 40 MG tablet Take 1 tablet (40 mg total) by mouth daily. 03/11/14  Yes Sueanne Margarita, MD  glipiZIDE (GLUCOTROL) 10 MG tablet Take 10 mg by mouth daily before breakfast.   Yes Historical Provider, MD  irbesartan (AVAPRO) 150 MG tablet Take 1 tablet (150 mg total) by mouth every morning. 03/11/14  Yes Sueanne Margarita, MD  lansoprazole (PREVACID) 15 MG capsule Take 15 mg by mouth daily.     Yes Historical Provider, MD  Multiple Vitamins-Minerals (MULTIVITAL) tablet Take 1 tablet by mouth daily.    Yes Historical Provider, MD  olopatadine (PATANOL) 0.1 % ophthalmic solution 1 drop 2 (two) times daily.   Yes Historical Provider, MD  rosuvastatin (CRESTOR) 40 MG tablet Take 1 tablet (40 mg total) by mouth daily. 03/11/14  Yes Sueanne Margarita, MD  MOVIPREP 100 G SOLR Take 1 kit (200 g total) by mouth once. 01/13/14    Willia Craze, NP   BP 141/55  Pulse 71  Temp(Src) 98.5 F (36.9 C) (Oral)  Resp 16  SpO2 100% Physical Exam  Nursing note and vitals reviewed. Constitutional: She is oriented to person, place, and time. She appears well-developed and well-nourished. No distress.  HENT:  Head: Normocephalic and atraumatic.  Eyes: Pupils are equal, round, and reactive to light.  Neck: Normal range of motion. Neck supple.  Cardiovascular: Normal rate and regular rhythm.   Pulmonary/Chest: Effort normal. No respiratory distress. She  has no wheezes.  Abdominal: Soft.  Neurological: She is alert and oriented to person, place, and time. She has normal strength. No cranial nerve deficit or sensory deficit. She displays a negative Romberg sign. GCS eye subscore is 4. GCS verbal subscore is 5. GCS motor subscore is 6.  Skin: Skin is warm and dry.    ED Course  Procedures (including critical care time) Labs Review Labs Reviewed  CBC - Abnormal; Notable for the following:    Hemoglobin 11.2 (*)    HCT 33.2 (*)    All other components within normal limits  COMPREHENSIVE METABOLIC PANEL - Abnormal; Notable for the following:    Sodium 136 (*)    Potassium 3.3 (*)    Glucose, Bld 54 (*)    GFR calc non Af Amer 73 (*)    GFR calc Af Amer 85 (*)    All other components within normal limits  URINALYSIS, ROUTINE W REFLEX MICROSCOPIC - Abnormal; Notable for the following:    Leukocytes, UA SMALL (*)    All other components within normal limits  CBG MONITORING, ED - Abnormal; Notable for the following:    Glucose-Capillary 59 (*)    All other components within normal limits  CBG MONITORING, ED - Abnormal; Notable for the following:    Glucose-Capillary 63 (*)    All other components within normal limits  CBG MONITORING, ED - Abnormal; Notable for the following:    Glucose-Capillary 115 (*)    All other components within normal limits  URINE MICROSCOPIC-ADD ON  CBC    Imaging Review No results  found.   EKG Interpretation None      MDM   Final diagnoses:  Hypoglycemia   5:13pm Pt given oral glucagon and he cbg only improved mildly, dose of IV glucose given. Spoke with pharmacist to review her medications to ensure that none of them could potentially cause this individually or in combination. Denies ETOH or drug use.   5:33 pm Glipizide can cause hypoglycemia episodes per hospital pharmacist. A  Pharmacist is currently in the ER and is going to go talk to patient and clarify that she is taking her medications correctly   5:56 pm Patient admitted as an inpatient for glucose control and monitoring.  [facility], inpatient, [facility], tele,   Linus Mako, PA-C 04/14/14 0011

## 2014-04-13 ENCOUNTER — Encounter (HOSPITAL_COMMUNITY): Payer: Self-pay | Admitting: *Deleted

## 2014-04-13 DIAGNOSIS — I509 Heart failure, unspecified: Secondary | ICD-10-CM

## 2014-04-13 DIAGNOSIS — E876 Hypokalemia: Secondary | ICD-10-CM

## 2014-04-13 DIAGNOSIS — E1165 Type 2 diabetes mellitus with hyperglycemia: Secondary | ICD-10-CM

## 2014-04-13 DIAGNOSIS — IMO0001 Reserved for inherently not codable concepts without codable children: Secondary | ICD-10-CM

## 2014-04-13 LAB — CBC
HEMATOCRIT: 30.9 % — AB (ref 36.0–46.0)
Hemoglobin: 10.5 g/dL — ABNORMAL LOW (ref 12.0–15.0)
MCH: 29.1 pg (ref 26.0–34.0)
MCHC: 34 g/dL (ref 30.0–36.0)
MCV: 85.6 fL (ref 78.0–100.0)
Platelets: 163 10*3/uL (ref 150–400)
RBC: 3.61 MIL/uL — AB (ref 3.87–5.11)
RDW: 13.6 % (ref 11.5–15.5)
WBC: 5.4 10*3/uL (ref 4.0–10.5)

## 2014-04-13 LAB — BASIC METABOLIC PANEL
BUN: 13 mg/dL (ref 6–23)
CO2: 26 meq/L (ref 19–32)
Calcium: 8.9 mg/dL (ref 8.4–10.5)
Chloride: 101 mEq/L (ref 96–112)
Creatinine, Ser: 0.86 mg/dL (ref 0.50–1.10)
GFR calc Af Amer: 80 mL/min — ABNORMAL LOW (ref >90)
GFR calc non Af Amer: 69 mL/min — ABNORMAL LOW (ref >90)
Glucose, Bld: 72 mg/dL (ref 70–99)
POTASSIUM: 3.6 meq/L — AB (ref 3.7–5.3)
SODIUM: 139 meq/L (ref 137–147)

## 2014-04-13 LAB — GLUCOSE, CAPILLARY
GLUCOSE-CAPILLARY: 52 mg/dL — AB (ref 70–99)
GLUCOSE-CAPILLARY: 100 mg/dL — AB (ref 70–99)
Glucose-Capillary: 78 mg/dL (ref 70–99)
Glucose-Capillary: 72 mg/dL (ref 70–99)
Glucose-Capillary: 115 mg/dL — ABNORMAL HIGH (ref 70–99)
Glucose-Capillary: 126 mg/dL — ABNORMAL HIGH (ref 70–99)

## 2014-04-13 MED ORDER — CARVEDILOL 25 MG PO TABS
25.0000 mg | ORAL_TABLET | Freq: Two times a day (BID) | ORAL | Status: DC
  Administered 2014-04-13 – 2014-04-14 (×3): 25 mg via ORAL
  Filled 2014-04-13 (×5): qty 1

## 2014-04-13 MED ORDER — PANTOPRAZOLE SODIUM 40 MG PO TBEC: 40.0000 mg | DELAYED_RELEASE_TABLET | Freq: Every day | ORAL | Status: DC

## 2014-04-13 MED ORDER — CLOPIDOGREL BISULFATE 75 MG PO TABS
75.0000 mg | ORAL_TABLET | Freq: Every day | ORAL | Status: DC
  Administered 2014-04-13 – 2014-04-14 (×2): 75 mg via ORAL
  Filled 2014-04-13 (×3): qty 1

## 2014-04-13 MED ORDER — FUROSEMIDE 40 MG PO TABS
40.0000 mg | ORAL_TABLET | Freq: Every day | ORAL | Status: DC
  Administered 2014-04-13 – 2014-04-14 (×2): 40 mg via ORAL
  Filled 2014-04-13 (×3): qty 1

## 2014-04-13 MED ORDER — PANTOPRAZOLE SODIUM 40 MG PO TBEC
40.0000 mg | DELAYED_RELEASE_TABLET | Freq: Every day | ORAL | Status: DC
  Administered 2014-04-13 – 2014-04-14 (×2): 40 mg via ORAL
  Filled 2014-04-13: qty 1

## 2014-04-13 MED ORDER — GI COCKTAIL ~~LOC~~
30.0000 mL | Freq: Once | ORAL | Status: AC
  Administered 2014-04-13: 30 mL via ORAL
  Filled 2014-04-13: qty 30

## 2014-04-13 MED ORDER — POTASSIUM CHLORIDE CRYS ER 20 MEQ PO TBCR
40.0000 meq | EXTENDED_RELEASE_TABLET | Freq: Once | ORAL | Status: AC
  Administered 2014-04-13: 40 meq via ORAL
  Filled 2014-04-13: qty 2

## 2014-04-13 NOTE — Progress Notes (Signed)
Chaplain followed up on consult from pt requesting prayer. Pt's husband was present. I met him and we shook hands. Dining Services arrived with pt's supper just as I arrived. I offered to stay but also offered to come back tomorrow. Pt agreed for me to come back tomorrow. Pt was sitting up and acknowledged that she was feeling better now than when she arrived.

## 2014-04-13 NOTE — Progress Notes (Signed)
[facility] PROGRESS NOTE  Penny James IZT:[id number] DOB: 04/11/1951 DOA: 04/12/2014 PCP: No PCP Per Patient  Assessment/Plan: Hypoglycemia  -Likely secondary to Glucotrol XL, will hold  -Continue Monitoring Accu-Cheks, Hypoglycemia protocol as ordered  -Blood glucose improved but still borderline, continue D5(monitor fluid status as with history of CHF)  -Her UA has small leukocytes but she is asymptomatic>> followup on pending urine culture Type II or unspecified type diabetes mellitus without mention of complication, uncontrolled  -Holding oral hypoglycemic secondary to above, follow and recommend changing to a alternate hypoglycemic agent prior to discharge given the high incidence of hypoglycemia with Glucotrol XL esp in elderly.  Hypokalemia  -Secondary to diuretic  -Replete, follow History of CAD/CABG  -She is chest pain free, continue outpatient medications  Benign essential HTN  -Continue outpatient medications  CHF (congestive heart failure)  -Compensated, continue outpatient medications including Lasix  -Monitor strict is and os daily weights and further manage accordingly   Code Status: full Family Communication:none at bedside Disposition Plan:    Consultants:  None  Procedures:  None  Antibiotics:  None  HPI/Subjective: States she is feeling better today, denies chest pain no dizziness  Objective: Filed Vitals:   04/13/14 1822  BP: 126/71  Pulse: 72  Temp: 98 F (36.7 C)  Resp: 18    Intake/Output Summary (Last 24 hours) at 04/13/14 1902 Last data filed at 04/13/14 1823  Gross per 24 hour  Intake   1083 ml  Output   1825 ml  Net   -742 ml   Filed Weights   04/12/14 1908 04/13/14 0501  Weight: 75.66 kg (166 lb 12.8 oz) 76.159 kg (167 lb 14.4 oz)    Exam:  General: alert & oriented x 3 In NAD Cardiovascular: RRR, nl S1 s2 Respiratory: CTAB Abdomen: soft +BS NT/ND, no masses palpable Extremities: No cyanosis and no  edema    Data Reviewed: Basic Metabolic Panel:  Recent Labs Lab 04/12/14 1552 04/13/14 0407  NA 136* 139  K 3.3* 3.6*  CL 99 101  CO2 26 26  GLUCOSE 54* 72  BUN 14 13  CREATININE 0.82 0.86  CALCIUM 9.2 8.9   Liver Function Tests:  Recent Labs Lab 04/12/14 1552  AST 27  ALT 23  ALKPHOS 67  BILITOT 0.4  PROT 6.8  ALBUMIN 3.8   No results found for this basename: LIPASE, AMYLASE,  in the last 168 hours No results found for this basename: AMMONIA,  in the last 168 hours CBC:  Recent Labs Lab 04/12/14 1552 04/13/14 0407  WBC 5.2  5.3 5.4  HGB 11.2*  11.3* 10.5*  HCT 33.2*  33.2* 30.9*  MCV 84.9  85.3 85.6  PLT 170  182 163   Cardiac Enzymes: No results found for this basename: CKTOTAL, CKMB, CKMBINDEX, TROPONINI,  in the last 168 hours BNP (last 3 results) No results found for this basename: PROBNP,  in the last 8760 hours CBG:  Recent Labs Lab 04/12/14 2319 04/13/14 0417 04/13/14 0713 04/13/14 1144 04/13/14 1630  GLUCAP 94 78 72 100* 115*    No results found for this or any previous visit (from the past 240 hour(s)).   Studies: No results found.  Scheduled Meds: . amLODipine  5 mg Oral Daily  . aspirin  162 mg Oral Daily  . atorvastatin  80 mg Oral q1800  . carvedilol  25 mg Oral BID WC  . clopidogrel  75 mg Oral Q breakfast  . enoxaparin (LOVENOX) injection  40 mg Subcutaneous Q24H  . ezetimibe  10 mg Oral QHS  . furosemide  40 mg Oral Q breakfast  . irbesartan  150 mg Oral q morning - 10a  . multivitamin with minerals  1 tablet Oral Daily  . olopatadine  1 drop Both Eyes BID  . pantoprazole  40 mg Oral Q breakfast  . sodium chloride  3 mL Intravenous Q12H   Continuous Infusions: . dextrose 5 % and 0.9% NaCl 50 mL/hr at 04/13/14 1804    Active Problems:   Benign essential HTN   Hypoglycemia   CHF (congestive heart failure)   Type II or unspecified type diabetes mellitus without mention of complication, uncontrolled    Hypokalemia    Time spent: [facility] Pager [phone]. If 7PM-7AM, please contact night-coverage at [email or web address], password [facility] 04/13/2014, 7:02 PM  LOS: 1 day

## 2014-04-13 NOTE — Care Management Note (Signed)
    Page 1 of 1   04/13/2014     3:47:10 PM CARE MANAGEMENT NOTE 04/13/2014  Patient:  Penny James, Penny James   Account Number:  [id number]  Date Initiated:  04/13/2014  Documentation initiated by:  [facility]  Subjective/Objective Assessment:   63 y.o. female past medical history significant for diabetes mellitus, hypertension, CHF, ischemic cardiomyopathy, CAD status post CABG in past, GERD, bipolar affective disorder who presents with     Action/Plan:   Monitor Accu-Cheks, Hypoglycemia protocol as ordered  -Will start on D5(monitor fluid status as with history of CHF).// Access for [facility] services.   Anticipated DC Date:  04/16/2014   Anticipated DC Plan:  [place]  CM consult      Choice offered to / List presented to:             Status of service:  In process, will continue to follow Medicare Important Message given?   (If response is "NO", the following Medicare IM given date fields will be blank) Date Medicare IM given:   Date Additional Medicare IM given:    Discharge Disposition:    Per UR Regulation:  Reviewed for med. necessity/level of care/duration of stay  If discussed at [place] of Stay Meetings, dates discussed:    Comments:

## 2014-04-13 NOTE — Progress Notes (Signed)
Consult received for hypoglycemia. Pt on Glucotrol 10 mg at home.  Pt's kidneys are not clearing the glucotrol, appears to have a stacking affect.  If want to continue a sulfonylurea, would recommend using Prandin (Repaglinide)which is taken only at the start of or during or shortly after a meal.  The half-life is 1-1.5 hrs and is metabolized by the liver vs the kidneys.  Dosage starts from 0.5-4 mg with each meal, can titrate dosage until control is attained.  Thank you, Rosita Kea, RN, CNS, Diabetes Coordinator [phone])

## 2014-04-13 NOTE — Plan of Care (Signed)
Problem: Food- and Nutrition-Related Knowledge Deficit (NB-1.1) Goal: Nutrition education Formal process to instruct or train a patient/client in a skill or to impart knowledge to help patients/clients voluntarily manage or modify food choices and eating behavior to maintain or improve health. Outcome: Completed/Met Date Met:  04/13/14 RD consulted to provide diet education for diabetes management   63 y.o. 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     No results found for this basename: HGBA1C   Pt reports that she has never had education on diet for diabetes management. Pt stated that she does not know what a carbohydrate is or what types of foods are carbohydrates. Pt reports eating breakfast consisting of a vegetable omelet with wheat toast, lunch is a salad with ranch dressing, and no dinner as pt is not hungry around that time. Pt only drinks water.  Provided "Carbohydrate Counting for People with Diabetes" handout from the Academy of Nutrition and Dietetics. Focused on sources of carbohydrates, MyPlate, meal spacing, and increasing vegetable consumption.   Pt on carb modified diet eating 100%.  Used teachback. Expect good compliance.  Labs and medications reviewed.   Penny James, BS Dietetic Intern Pager: [phone]

## 2014-04-13 NOTE — Plan of Care (Signed)
Reviewed dietetic intern note. Appropriate diet education was provided.  Pryor Ochoa RD, LDN Inpatient Clinical Dietitian Pager: [phone] After Hours Pager: [phone]

## 2014-04-14 DIAGNOSIS — E782 Mixed hyperlipidemia: Secondary | ICD-10-CM

## 2014-04-14 LAB — URINE CULTURE
CULTURE: NO GROWTH
Colony Count: NO GROWTH

## 2014-04-14 LAB — BASIC METABOLIC PANEL
BUN: 9 mg/dL (ref 6–23)
CALCIUM: 9 mg/dL (ref 8.4–10.5)
CO2: 26 meq/L (ref 19–32)
Chloride: 100 mEq/L (ref 96–112)
Creatinine, Ser: 0.81 mg/dL (ref 0.50–1.10)
GFR calc Af Amer: 86 mL/min — ABNORMAL LOW (ref >90)
GFR calc non Af Amer: 75 mL/min — ABNORMAL LOW (ref >90)
GLUCOSE: 117 mg/dL — AB (ref 70–99)
POTASSIUM: 4.2 meq/L (ref 3.7–5.3)
Sodium: 139 mEq/L (ref 137–147)

## 2014-04-14 LAB — GLUCOSE, CAPILLARY
GLUCOSE-CAPILLARY: 111 mg/dL — AB (ref 70–99)
GLUCOSE-CAPILLARY: 118 mg/dL — AB (ref 70–99)
GLUCOSE-CAPILLARY: 164 mg/dL — AB (ref 70–99)
Glucose-Capillary: 112 mg/dL — ABNORMAL HIGH (ref 70–99)
Glucose-Capillary: 145 mg/dL — ABNORMAL HIGH (ref 70–99)

## 2014-04-14 MED ORDER — GLIMEPIRIDE 1 MG PO TABS: 1.0000 mg | ORAL_TABLET | Freq: Every day | ORAL | Status: DC

## 2014-04-14 NOTE — Discharge Summary (Signed)
Physician Discharge Summary  Penny James KZS:[id number] DOB: 01-Dec-1950 DOA: 04/12/2014  PCP: No PCP Per Patient  Admit date: 04/12/2014 Discharge date: 04/14/2014  Time spent: >30 minutes  Recommendations for Outpatient Follow-up:  Check A1C and follow CBG's closely; adjust hypoglycemic regimen as needed Reassess BP and adjust medications as needed BMET to follow electrolytes and renal function  Discharge Diagnoses:  Benign essential HTN Hypoglycemia CHF (congestive heart failure) diastolic, chronic Type II or unspecified type diabetes mellitus without mention of complication, uncontrolled Hypokalemia   Discharge Condition: stable and improved. Discharged home. Patient advised to follow with PCP in 1 week  Diet recommendation: low sodium and low carbohydrates diet  Filed Weights   04/12/14 1908 04/13/14 0501 04/14/14 0517  Weight: 75.66 kg (166 lb 12.8 oz) 76.159 kg (167 lb 14.4 oz) 76.386 kg (168 lb 6.4 oz)    History of present illness:  63 y.o. 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 She states that yesterday evening her husband was having a hard time waking her up, and when her blood glucose was checked it was 33>> EMS was called and following treatment her blood glucose came up and she did not want to come to the hospital then. She relates that her today her blood sugar has been in the 50s and arm even after eating and rechecking her blood sugars remained low, she admits to dizziness and her husband report some confusion as well and so she came to the ED. In the ED she was given glucose and glucagon administered x2 and hypoglycemia was recurrent and so she is admitted for further evaluation and management. She states that she last took her Glucotrol XL yesterday. She denies chest pain, shortness of breath, leg  swelling, dysuria, fevers, nausea or vomiting, melena and no hematochezia. She admits to having diarrhea last week but has not had any further in the past couple of days.   Hospital Course:  1-hypoglycemia: secondary to use of XL glipizide -medication discontinued, supportive care provided and at discharge CBG's in the 130's-140's postprandial -patient discharge on low dose glimepiride and instruction to avoid skipping meals  2-HTN: stable and well controlled. -continue current antihypertensive regimen -advise to follow low sodium diet  3-DM type 2: medication changes as mentioned above. -patient to follow with PCP closely for A1C checks and further adjustments to hypoglycemic regimen -patient advised to follow low carb diet  4-Chronic diastolic heart failure -compensated -continue current home medication regimen -advised to follow low sodium diet  5-hx of CAD/CABG: no CP or SOB -continue current outpatient regimen  6-hypokalemia: repleted and WNL at discharge  7-HLD: continue statins  8-glaucoma: continue eye drops.   Procedures: None   Consultations: None   Discharge Exam: Filed Vitals:   04/14/14 0517  BP: 124/43  Pulse: 79  Temp: 98.3 F (36.8 C)  Resp: 18    General: alert & oriented x 3 In NAD  Cardiovascular: RRR, nl S1 s2 ; positive SEM Respiratory: CTA Bilaterally  Abdomen: soft +BS NT/ND, no masses palpable  Extremities: No cyanosis and no edema   Discharge Instructions You were cared for by a hospitalist during your hospital stay. If you have any questions about your discharge medications or the care you received while you were in the hospital after you are discharged, you can call the unit and asked to speak with the hospitalist on call  if the hospitalist that took care of you is not available. Once you are discharged, your primary care physician will handle any further medical issues. Please note that NO REFILLS for any discharge medications will be  authorized once you are discharged, as it is imperative that you return to your primary care physician (or establish a relationship with a primary care physician if you do not have one) for your aftercare needs so that they can reassess your need for medications and monitor your lab values.  Discharge Instructions   Discharge instructions    Complete by:  As directed   Take medications as prescribed Follow up with PCP in 1 week Follow a low sodium and low carbohydrates diet Noticed changes on medications (glipizide has been discontinued and instead you has been prescribed low dose glimepiride); make sur you use medication in am after breakfast. No skipping meals            Medication List    STOP taking these medications       aspirin 81 MG tablet     glipiZIDE 10 MG tablet  Commonly known as:  GLUCOTROL     MOVIPREP 100 G Solr  Generic drug:  peg 3350 powder      TAKE these medications       amLODipine 5 MG tablet  Commonly known as:  NORVASC  Take 1 tablet (5 mg total) by mouth daily.     carvedilol 25 MG tablet  Commonly known as:  COREG  Take 1 tablet (25 mg total) by mouth 2 (two) times daily with a meal.     clopidogrel 75 MG tablet  Commonly known as:  PLAVIX  Take 1 tablet (75 mg total) by mouth daily.     ezetimibe 10 MG tablet  Commonly known as:  ZETIA  Take 1 tablet (10 mg total) by mouth daily.     furosemide 40 MG tablet  Commonly known as:  LASIX  Take 1 tablet (40 mg total) by mouth daily.     glimepiride 1 MG tablet  Commonly known as:  AMARYL  Take 1 tablet (1 mg total) by mouth daily with breakfast.     irbesartan 150 MG tablet  Commonly known as:  AVAPRO  Take 1 tablet (150 mg total) by mouth every morning.     lansoprazole 15 MG capsule  Commonly known as:  PREVACID  Take 15 mg by mouth daily.     MULTIVITAL tablet  Take 1 tablet by mouth daily.     olopatadine 0.1 % ophthalmic solution  Commonly known as:  PATANOL  1 drop 2 (two)  times daily.     rosuvastatin 40 MG tablet  Commonly known as:  CRESTOR  Take 1 tablet (40 mg total) by mouth daily.       Allergies  Allergen Reactions  . Penicillins Rash  . Protamine     Unknown reaction      The results of significant diagnostics from this hospitalization (including imaging, microbiology, ancillary and laboratory) are listed below for reference.    Significant Diagnostic Studies: No results found.  Microbiology: Recent Results (from the past 240 hour(s))  URINE CULTURE     Status: None   Collection Time    04/12/14  4:54 PM      Result Value Ref Range Status   Specimen Description URINE, CLEAN CATCH   Final   Special Requests ADD [id number]   Final   Culture  Setup Time  Final   Value: 04/13/2014 05:28     Performed at [facility]     Final   Value: NO GROWTH     Performed at [facility]   Culture     Final   Value: NO GROWTH     Performed at [facility]   Report Status 04/14/2014 FINAL   Final     Labs: Basic Metabolic Panel:  Recent Labs Lab 04/12/14 1552 04/13/14 0407 04/14/14 0545  NA 136* 139 139  K 3.3* 3.6* 4.2  CL 99 101 100  CO2 26 26 26   GLUCOSE 54* 72 117*  BUN 14 13 9   CREATININE 0.82 0.86 0.81  CALCIUM 9.2 8.9 9.0   Liver Function Tests:  Recent Labs Lab 04/12/14 1552  AST 27  ALT 23  ALKPHOS 67  BILITOT 0.4  PROT 6.8  ALBUMIN 3.8   CBC:  Recent Labs Lab 04/12/14 1552 04/13/14 0407  WBC 5.2  5.3 5.4  HGB 11.2*  11.3* 10.5*  HCT 33.2*  33.2* 30.9*  MCV 84.9  85.3 85.6  PLT 170  182 163   CBG:  Recent Labs Lab 04/14/14 0004 04/14/14 0332 04/14/14 0725 04/14/14 1055 04/14/14 1409  GLUCAP 112* 111* 118* 164* 145*    Signed:  Barton Dubois  [facility] 04/14/2014, 3:30 PM

## 2014-04-14 NOTE — Progress Notes (Signed)
Chaplain followed up on visit from yesterday when pt had just gotten supper and agreed for me to come back today. Pt was dressed and sitting on side of bed. Pt stated she will be going home today. Pt's husband was present, as were some great-granddaughters. Pt was smiling and upbeat, as was her husband. Pt expressed joy for her quick recovery and thankfulness for good staff care. She expressed appreciation to chaplain for his visit.

## 2014-04-20 NOTE — Telephone Encounter (Signed)
Unable to Bill patient No Show Fee for Colon

## 2014-05-04 ENCOUNTER — Encounter: Payer: Self-pay | Admitting: Family

## 2014-05-04 ENCOUNTER — Telehealth: Payer: Self-pay | Admitting: Family

## 2014-05-04 ENCOUNTER — Ambulatory Visit (INDEPENDENT_AMBULATORY_CARE_PROVIDER_SITE_OTHER): Payer: Medicare Other | Admitting: Family

## 2014-05-04 ENCOUNTER — Encounter: Payer: Medicare Other | Admitting: Gastroenterology

## 2014-05-04 VITALS — BP 148/64 | HR 67 | Ht 63.0 in | Wt 168.0 lb

## 2014-05-04 DIAGNOSIS — E119 Type 2 diabetes mellitus without complications: Secondary | ICD-10-CM

## 2014-05-04 DIAGNOSIS — E785 Hyperlipidemia, unspecified: Secondary | ICD-10-CM

## 2014-05-04 DIAGNOSIS — I10 Essential (primary) hypertension: Secondary | ICD-10-CM

## 2014-05-04 DIAGNOSIS — Z7689 Persons encountering health services in other specified circumstances: Secondary | ICD-10-CM

## 2014-05-04 DIAGNOSIS — Z7189 Other specified counseling: Secondary | ICD-10-CM

## 2014-05-04 DIAGNOSIS — I739 Peripheral vascular disease, unspecified: Secondary | ICD-10-CM

## 2014-05-04 NOTE — Progress Notes (Signed)
Pre visit review using our clinic review tool, if applicable. No additional management support is needed unless otherwise documented below in the visit note. 

## 2014-05-04 NOTE — Patient Instructions (Signed)
Exercise to Stay Healthy Exercise helps you become and stay healthy. EXERCISE IDEAS AND TIPS Choose exercises that:  You enjoy.  Fit into your day. You do not need to exercise really hard to be healthy. You can do exercises at a slow or medium level and stay healthy. You can:  Stretch before and after working out.  Try yoga, Pilates, or tai chi.  Lift weights.  Walk fast, swim, jog, run, climb stairs, bicycle, dance, or rollerskate.  Take aerobic classes. Exercises that burn about 150 calories:  Running 1  miles in 15 minutes.  Playing volleyball for 45 to 60 minutes.  Washing and waxing a car for 45 to 60 minutes.  Playing touch football for 45 minutes.  Walking 1  miles in 35 minutes.  Pushing a stroller 1  miles in 30 minutes.  Playing basketball for 30 minutes.  Raking leaves for 30 minutes.  Bicycling 5 miles in 30 minutes.  Walking 2 miles in 30 minutes.  Dancing for 30 minutes.  Shoveling snow for 15 minutes.  Swimming laps for 20 minutes.  Walking up stairs for 15 minutes.  Bicycling 4 miles in 15 minutes.  Gardening for 30 to 45 minutes.  Jumping rope for 15 minutes.  Washing windows or floors for 45 to 60 minutes. Document Released: 11/17/2010 Document Revised: 01/07/2012 Document Reviewed: 11/17/2010 ExitCare Patient Information 2015 [employer]. This information is not intended to replace advice given to you by your health care provider. Make sure you discuss any questions you have with your health care provider.  

## 2014-05-04 NOTE — Progress Notes (Signed)
Subjective:    Patient ID: Penny James, female    DOB: 12-04-1950, 63 y.o.   MRN: [id number]  HPI 63 y.o. AA female presents today to establish care. Pt is followed by cardiology for CHF, CAD, she also has a hx of hypertension, hyperlipidemia, type 2 diabetes, peripheral neuropathy. Pt current medications were discussed and refills were placed as needed. Pt is up to date on her screening, her immunizations will be given during her physical follow up as well as lab work. Pt states she is exercising 1 hours per day 6x per week with her children and is eating a "much better diet". Pt admits to losing 50 pounds in the last year. Denies fever, chills, fatigue, SOB and change in appetite.     Review of Systems  Constitutional: Negative.   HENT: Negative.   Eyes: Negative.   Respiratory: Negative.   Cardiovascular: Negative.   Gastrointestinal: Negative.   Endocrine: Negative.   Musculoskeletal: Negative.   Skin: Negative.   Allergic/Immunologic: Negative.   Neurological: Negative.   Hematological: Negative.   Psychiatric/Behavioral: Negative.    Past Medical History  Diagnosis Date  . Hyperlipidemia   . Hypertension   . CHF (congestive heart failure)   . Arthritis   . Anemia   . Ischemic cardiomyopathy   . Bipolar affective disorder   . GERD (gastroesophageal reflux disease)   . Obesity   . Peripheral neuropathy   . Depression   . Osteoarthritis   . 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 Bilateral carotid bruits     60-79% bilateral ICA steosis being followed by Dr Early not stent candidate, may need sx in future  . 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 CHF (congestive heart failure)   . PVD (peripheral vascular disease)     s/p PTA of left renal artery  . Coronary atherosclerosis     3 vessel s/p CABG - cath 2005 with widely patent grafts and distal disease past  the grafts, cath 2010 with occluded SVG to IM and SVG to OM/PDA with PTCA of left circ and PCI of SVG to OM/PDA  . Carotid artery occlusion     60-79% bilateral ICA stenosis s/p Right CEA 2013  followed by Dr. Donnetta Hutching    History   Social History  . Marital Status: Married    Spouse Name: N/A    Number of Children: 3  . Years of Education: N/A   Occupational History  . DISABLED    Social History Main Topics  . Smoking status: Never Smoker   . Smokeless tobacco: Never Used  . Alcohol Use: No  . Drug Use: No  . Sexual Activity: No   Other Topics Concern  . Not on file   Social History Narrative  . No narrative on file    Past Surgical History  Procedure Laterality Date  . Abdominal hysterectomy  1984  . Ptca  2003    with stent of left renal artery  . Tonsillectomy      AS A CHILD  . Endarterectomy  02/18/2012    Procedure: ENDARTERECTOMY CAROTID;  Surgeon: Rosetta Posner, MD;  Location: [facility] OR;  Service: Vascular;  Laterality: Right;  Right Carotid endarterectomy with Dacron patch angioplasty with resection of internal carotid artery  . Carotid endarterectomy  02/18/12    Right CEA  . Coronary artery bypass graft  3 vessel coronary disease S?P CABG with widely patent grafts by cath in 2005 and distal disease past the graft insertion sites.  . Cardiac catheterization  03/25/09    Occluded SVG to intermediate and SVG to OM/posterior descending artery with PTCA left circ 6/10 s/p PTCA stent in SVG to OM/PDA, 07/2002 s/p PCI of left circ 03/2009  . Carotid endarterectomy Right 2013    Family History  Problem Relation Age of Onset  . Heart disease Brother     Heart Disease before age 56  . Glaucoma Father   . Stroke Paternal Grandmother   . Coronary artery disease Paternal Grandmother   . Cerebral aneurysm Brother   . Clotting disorder Sister     Allergies  Allergen Reactions  . Penicillins Rash  . Protamine     Unknown reaction    Current Outpatient  Prescriptions on File Prior to Visit  Medication Sig Dispense Refill  . amLODipine (NORVASC) 5 MG tablet Take 1 tablet (5 mg total) by mouth daily.  90 tablet  1  . carvedilol (COREG) 25 MG tablet Take 1 tablet (25 mg total) by mouth 2 (two) times daily with a meal.  180 tablet  1  . clopidogrel (PLAVIX) 75 MG tablet Take 1 tablet (75 mg total) by mouth daily.  90 tablet  1  . ezetimibe (ZETIA) 10 MG tablet Take 1 tablet (10 mg total) by mouth daily.  90 tablet  1  . furosemide (LASIX) 40 MG tablet Take 1 tablet (40 mg total) by mouth daily.  90 tablet  1  . glimepiride (AMARYL) 1 MG tablet Take 1 tablet (1 mg total) by mouth daily with breakfast.  30 tablet  1  . irbesartan (AVAPRO) 150 MG tablet Take 1 tablet (150 mg total) by mouth every morning.  90 tablet  1  . lansoprazole (PREVACID) 15 MG capsule Take 15 mg by mouth daily.        . Multiple Vitamins-Minerals (MULTIVITAL) tablet Take 1 tablet by mouth daily.       [name] olopatadine (PATANOL) 0.1 % ophthalmic solution 1 drop 2 (two) times daily.      . rosuvastatin (CRESTOR) 40 MG tablet Take 1 tablet (40 mg total) by mouth daily.  90 tablet  1   No current facility-administered medications on file prior to visit.    BP 148/64  Pulse 67  Ht 5\' 3"  (1.6 m)  Wt 168 lb (76.204 kg)  BMI 29.77 kg/m2chart    Objective:   Physical Exam  Constitutional: She is oriented to person, place, and time. She appears well-developed and well-nourished. She is active.  Cardiovascular: Normal rate, regular rhythm, normal heart sounds and normal pulses.   Pulmonary/Chest: Effort normal and breath sounds normal.  Abdominal: Soft. Normal appearance and bowel sounds are normal.  Neurological: She is alert and oriented to person, place, and time.  Psychiatric: She has a normal mood and affect. Her speech is normal and behavior is normal. Thought content normal.          Assessment & Plan:  Penny James was seen today for establish care.  Diagnoses and  associated orders for this visit:  Encounter to establish care  Type 2 diabetes mellitus without complication  Unspecified essential hypertension  Hyperlipidemia  Peripheral vascular disease, unspecified   Education: continue to exercise daily for at least 30 minutes per day. Eat diet low in saturated fat, cholesterol and sodium. Continue to follow up with cardiology.   Follow up: 3  months for physical.

## 2014-05-04 NOTE — Telephone Encounter (Signed)
Relevant patient education mailed to patient.  

## 2014-05-05 ENCOUNTER — Other Ambulatory Visit: Payer: Self-pay | Admitting: Family

## 2014-05-10 ENCOUNTER — Encounter: Payer: Self-pay | Admitting: Family

## 2014-05-11 ENCOUNTER — Other Ambulatory Visit (HOSPITAL_COMMUNITY): Payer: Medicare Other

## 2014-05-11 ENCOUNTER — Ambulatory Visit: Payer: Medicare Other | Admitting: Family

## 2014-06-04 ENCOUNTER — Telehealth: Payer: Self-pay | Admitting: *Deleted

## 2014-06-04 DIAGNOSIS — E669 Obesity, unspecified: Secondary | ICD-10-CM

## 2014-06-04 DIAGNOSIS — E876 Hypokalemia: Secondary | ICD-10-CM

## 2014-06-04 DIAGNOSIS — IMO0001 Reserved for inherently not codable concepts without codable children: Secondary | ICD-10-CM

## 2014-06-04 DIAGNOSIS — E162 Hypoglycemia, unspecified: Secondary | ICD-10-CM

## 2014-06-04 DIAGNOSIS — E782 Mixed hyperlipidemia: Secondary | ICD-10-CM

## 2014-06-04 DIAGNOSIS — E1165 Type 2 diabetes mellitus with hyperglycemia: Secondary | ICD-10-CM

## 2014-06-04 NOTE — Telephone Encounter (Signed)
Patient has an appointment to follow up with diabetes only.  However she does have an upcoming physical. Physical labs ordered Diabetic bundle

## 2014-06-07 ENCOUNTER — Other Ambulatory Visit: Payer: Medicare Other

## 2014-06-11 ENCOUNTER — Ambulatory Visit: Payer: Medicare Other | Admitting: Family

## 2014-06-11 ENCOUNTER — Other Ambulatory Visit (HOSPITAL_COMMUNITY): Payer: Medicare Other

## 2014-06-16 ENCOUNTER — Ambulatory Visit: Payer: Medicare Other | Admitting: Family

## 2014-06-16 ENCOUNTER — Other Ambulatory Visit (INDEPENDENT_AMBULATORY_CARE_PROVIDER_SITE_OTHER): Payer: Medicare Other

## 2014-06-16 DIAGNOSIS — Z0000 Encounter for general adult medical examination without abnormal findings: Secondary | ICD-10-CM

## 2014-06-16 LAB — CBC WITH DIFFERENTIAL/PLATELET
Basophils Absolute: 0 10*3/uL (ref 0.0–0.1)
Basophils Relative: 1.2 % (ref 0.0–3.0)
EOS ABS: 0.1 10*3/uL (ref 0.0–0.7)
EOS PCT: 3.8 % (ref 0.0–5.0)
HCT: 33.7 % — ABNORMAL LOW (ref 36.0–46.0)
Hemoglobin: 11.4 g/dL — ABNORMAL LOW (ref 12.0–15.0)
Lymphocytes Relative: 45.3 % (ref 12.0–46.0)
Lymphs Abs: 1.7 10*3/uL (ref 0.7–4.0)
MCHC: 33.8 g/dL (ref 30.0–36.0)
MCV: 87.5 fl (ref 78.0–100.0)
MONO ABS: 0.4 10*3/uL (ref 0.1–1.0)
Monocytes Relative: 9.2 % (ref 3.0–12.0)
NEUTROS ABS: 1.5 10*3/uL (ref 1.4–7.7)
Neutrophils Relative %: 40.5 % — ABNORMAL LOW (ref 43.0–77.0)
Platelets: 161 10*3/uL (ref 150.0–400.0)
RBC: 3.85 Mil/uL — ABNORMAL LOW (ref 3.87–5.11)
RDW: 14 % (ref 11.5–15.5)
WBC: 3.8 10*3/uL — ABNORMAL LOW (ref 4.0–10.5)

## 2014-06-16 LAB — BASIC METABOLIC PANEL
BUN: 18 mg/dL (ref 6–23)
CHLORIDE: 101 meq/L (ref 96–112)
CO2: 29 meq/L (ref 19–32)
CREATININE: 0.8 mg/dL (ref 0.4–1.2)
Calcium: 9.4 mg/dL (ref 8.4–10.5)
GFR: 89.82 mL/min (ref >60.00)
Glucose, Bld: 94 mg/dL (ref 70–99)
POTASSIUM: 4.4 meq/L (ref 3.5–5.1)
Sodium: 135 mEq/L (ref 135–145)

## 2014-06-16 LAB — HEPATIC FUNCTION PANEL
ALBUMIN: 4.1 g/dL (ref 3.5–5.2)
ALT: 35 U/L (ref 0–35)
AST: 27 U/L (ref 0–37)
Alkaline Phosphatase: 64 U/L (ref 39–117)
Bilirubin, Direct: 0 mg/dL (ref 0.0–0.3)
TOTAL PROTEIN: 7 g/dL (ref 6.0–8.3)
Total Bilirubin: 0.5 mg/dL (ref 0.2–1.2)

## 2014-06-16 LAB — POCT URINALYSIS DIPSTICK
BILIRUBIN UA: NEGATIVE
Glucose, UA: NEGATIVE
Ketones, UA: NEGATIVE
NITRITE UA: NEGATIVE
PH UA: 6.5
PROTEIN UA: NEGATIVE
RBC UA: NEGATIVE
Spec Grav, UA: <=1.005
UROBILINOGEN UA: 0.2

## 2014-06-16 LAB — LIPID PANEL
Cholesterol: 107 mg/dL (ref 0–200)
HDL: 35.5 mg/dL — AB (ref >39.00)
LDL CALC: 56 mg/dL (ref 0–99)
NonHDL: 71.5
Total CHOL/HDL Ratio: 3
Triglycerides: 77 mg/dL (ref 0.0–149.0)
VLDL: 15.4 mg/dL (ref 0.0–40.0)

## 2014-06-16 LAB — MICROALBUMIN / CREATININE URINE RATIO
Creatinine,U: 15.4 mg/dL
MICROALB/CREAT RATIO: 1.3 mg/g (ref 0.0–30.0)
Microalb, Ur: 0.2 mg/dL (ref 0.0–1.9)

## 2014-06-16 LAB — HEMOGLOBIN A1C: HEMOGLOBIN A1C: 6.1 % (ref 4.6–6.5)

## 2014-06-16 LAB — TSH: TSH: 0.9 u[IU]/mL (ref 0.35–4.50)

## 2014-06-25 ENCOUNTER — Telehealth: Payer: Self-pay | Admitting: Family

## 2014-06-25 DIAGNOSIS — E119 Type 2 diabetes mellitus without complications: Secondary | ICD-10-CM

## 2014-06-25 NOTE — Telephone Encounter (Signed)
Pt calling to report a red, painful growth under her little toe on her right foot.  States this has been present for about a month and she has tried home remedies that have not helped.  Pt is requesting a referral to a podiatrist for further evaluation.

## 2014-06-25 NOTE — Telephone Encounter (Signed)
Please advise 

## 2014-06-25 NOTE — Addendum Note (Signed)
Addended byRoxy Cedar B on: 06/25/2014 03:51 PM   Modules accepted: Orders

## 2014-07-08 ENCOUNTER — Ambulatory Visit: Payer: Self-pay | Admitting: Podiatry

## 2014-07-12 ENCOUNTER — Ambulatory Visit: Payer: Medicare Other | Admitting: *Deleted

## 2014-07-14 ENCOUNTER — Ambulatory Visit: Payer: Medicare Other | Admitting: *Deleted

## 2014-07-15 ENCOUNTER — Encounter: Payer: Self-pay | Admitting: Family

## 2014-07-15 ENCOUNTER — Ambulatory Visit (INDEPENDENT_AMBULATORY_CARE_PROVIDER_SITE_OTHER): Payer: Medicare Other | Admitting: *Deleted

## 2014-07-15 VITALS — BP 134/62

## 2014-07-15 DIAGNOSIS — Z23 Encounter for immunization: Secondary | ICD-10-CM

## 2014-07-15 DIAGNOSIS — I10 Essential (primary) hypertension: Secondary | ICD-10-CM

## 2014-07-15 NOTE — Progress Notes (Signed)
Pt comes in today for a nurse visit BP check for the diabetic bundle.  Her BP today was 134/62.  She does check her BP's at home and they run 120/65.  She takes amlodipine 5 mg once daily and carvedilol 25 mg once daily.  She an appt with Roxy Cedar, NP on 08/04/14.  Told pt to continue current medications and keep appt with Padonda.  She also wanted a flu shot so I gave her a flu shot

## 2014-07-16 ENCOUNTER — Ambulatory Visit: Payer: Self-pay | Admitting: Family

## 2014-07-16 ENCOUNTER — Other Ambulatory Visit (HOSPITAL_COMMUNITY): Payer: Medicare Other

## 2014-07-20 ENCOUNTER — Other Ambulatory Visit: Payer: Self-pay | Admitting: Family

## 2014-07-28 ENCOUNTER — Encounter: Payer: Self-pay | Admitting: Family

## 2014-07-29 ENCOUNTER — Other Ambulatory Visit (HOSPITAL_COMMUNITY): Payer: Medicare Other

## 2014-07-29 ENCOUNTER — Ambulatory Visit: Payer: Medicare Other | Admitting: Family

## 2014-08-02 ENCOUNTER — Ambulatory Visit: Payer: Medicare Other | Admitting: Podiatry

## 2014-08-02 ENCOUNTER — Telehealth: Payer: Self-pay | Admitting: Family

## 2014-08-02 ENCOUNTER — Encounter: Payer: Self-pay | Admitting: Cardiology

## 2014-08-02 DIAGNOSIS — I5032 Chronic diastolic (congestive) heart failure: Secondary | ICD-10-CM | POA: Insufficient documentation

## 2014-08-02 MED ORDER — BLOOD GLUCOSE TEST VI STRP: ORAL_STRIP | Status: DC

## 2014-08-02 MED ORDER — BAYER MICROLET LANCETS MISC: Status: DC

## 2014-08-02 NOTE — Telephone Encounter (Signed)
DONE

## 2014-08-02 NOTE — Telephone Encounter (Signed)
Pt needs news rxs for breeze 2 meter. Pt needs bayer microlet lancets and test strips. Pt needs 90 day supply of each sent to [facility] [address]

## 2014-08-02 NOTE — Progress Notes (Signed)
[place], [place], [place]  [address] Phone: [phone] Fax:  [phone]  Date:  08/03/2014   ID:  Penny James, DOB 03/10/1951, MRN [id number]  PCP:  Donia Ast, FNP  Cardiologist:  Fransico Him, MD    History of Present Illness: Penny James is a 63 y.o. female with a history of HTN, ASCAD, chronic systolic CHF, ischemic DCM and dyslipidemia who presents today for followup. She is doing well. She denies any chest pain, SOB, DOE, orthopnea, LE edema, dizziness, palpitations or syncope. She walks for exercise.   Wt Readings from Last 3 Encounters:  08/03/14 160 lb (72.576 kg)  05/04/14 168 lb (76.204 kg)  04/14/14 168 lb 6.4 oz (76.386 kg)     Past Medical History  Diagnosis Date  . Hyperlipidemia   . Hypertension   . Arthritis   . Anemia   . Ischemic cardiomyopathy     EF 55% by cath 2010  . Bipolar affective disorder   . GERD (gastroesophageal reflux disease)   . Obesity   . Peripheral neuropathy   . Depression   . Osteoarthritis   . 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 Bilateral carotid bruits     60-79% bilateral ICA steosis being followed by Dr Early not stent candidate, may need sx in future  . 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 PVD (peripheral vascular disease)     s/p PTA of left renal artery  . Coronary atherosclerosis     3 vessel s/p CABG - cath 2005 with widely patent grafts and distal disease past the grafts, cath 2010 with occluded SVG to IM and SVG to OM/PDA with PTCA of left circ and PCI of SVG to OM/PDA  . Carotid artery occlusion     60-79% bilateral ICA stenosis s/p Right CEA 2013  followed by Dr. Donnetta Hutching  . Chronic diastolic CHF (congestive heart failure)     Current Outpatient Prescriptions  Medication Sig Dispense Refill  . amLODipine (NORVASC) 5 MG tablet Take 1 tablet (5 mg total) by mouth  daily.  90 tablet  1  . aspirin 81 MG tablet Take 81 mg by mouth daily.      [name] BAYER MICROLET LANCETS lancets Use as instructed  100 each  12  . carvedilol (COREG) 25 MG tablet Take 1 tablet (25 mg total) by mouth 2 (two) times daily with a meal.  180 tablet  1  . ezetimibe (ZETIA) 10 MG tablet Take 1 tablet (10 mg total) by mouth daily.  90 tablet  1  . ferrous sulfate 325 (65 FE) MG tablet Take 325 mg by mouth daily with breakfast.      . furosemide (LASIX) 40 MG tablet Take 1 tablet (40 mg total) by mouth daily.  90 tablet  1  . glimepiride (AMARYL) 1 MG tablet Take 1 tablet (1 mg total) by mouth daily with breakfast.  30 tablet  1  . Glucose Blood (BLOOD GLUCOSE TEST STRIPS) STRP Use to check blood sugar once daily  100 each  0  . irbesartan (AVAPRO) 150 MG tablet Take 1 tablet (150 mg total) by mouth every morning.  90 tablet  1  . Multiple Vitamins-Minerals (MULTIVITAL) tablet Take 1 tablet by mouth daily.       [name] olopatadine (PATANOL) 0.1 % ophthalmic solution 1 drop 2 (two) times daily.      [name]  PLAVIX 75 MG tablet TAKE 1 TABLET BY MOUTH DAILY  90 tablet  0  . PREVACID 15 MG capsule take 1 capsule by mouth once daily  90 capsule  3  . rosuvastatin (CRESTOR) 40 MG tablet Take 1 tablet (40 mg total) by mouth daily.  90 tablet  1   No current facility-administered medications for this visit.    Allergies:    Allergies  Allergen Reactions  . Penicillins Rash  . Protamine     Unknown reaction    Social History:  The patient  reports that she has never smoked. She has never used smokeless tobacco. She reports that she does not drink alcohol or use illicit drugs.   Family History:  The patient's family history includes Cerebral aneurysm in her brother; Clotting disorder in her sister; Coronary artery disease in her paternal grandmother; Glaucoma in her father; Heart disease in her brother; Stroke in her paternal grandmother.   ROS:  Please see the history of present illness.      All  other systems reviewed and negative.   PHYSICAL EXAM: VS:  BP 124/62  Pulse 64  Ht 5' 3.5" (1.613 m)  Wt 160 lb (72.576 kg)  BMI 27.89 kg/m2 Well nourished, well developed, in no acute distress HEENT: normal Neck: no JVDleft carotid bruit Cardiac:  normal S1, S2; RRR; no murmur Lungs:  clear to auscultation bilaterally, no wheezing, rhonchi or rales Abd: soft, nontender, no hepatomegaly Ext: no edema Skin: warm and dry Neuro:  CNs 2-12 intact, no focal abnormalities noted   ASSESSMENT AND PLAN:  1. ASCAD with no angina - cotinue ASA/Plavix  2. HTN well controlled - continue Carvedilol/amlodipine/Avapro  3. Dyslipidemia - LDL at goal at 56 on 06/16/2014 - continue Crestor/Zetia  4. Bilateral carotid artery stenosis s/p right CEA followed by Dr. Donnetta Hutching 5. Chronic diastolic CHF - appears euvolemic - continue Lasix and BB 6. Renal artery stenosis s/p PTA of left renal artery 7.  Type II DM with vascular disease  Followup with me in 6 months     Signed, Fransico Him, MD [facility] 08/03/2014 8:30 AM

## 2014-08-03 ENCOUNTER — Ambulatory Visit (INDEPENDENT_AMBULATORY_CARE_PROVIDER_SITE_OTHER): Payer: Medicare Other | Admitting: Cardiology

## 2014-08-03 ENCOUNTER — Encounter: Payer: Self-pay | Admitting: Cardiology

## 2014-08-03 VITALS — BP 124/62 | HR 64 | Ht 63.5 in | Wt 160.0 lb

## 2014-08-03 DIAGNOSIS — I6529 Occlusion and stenosis of unspecified carotid artery: Secondary | ICD-10-CM

## 2014-08-03 DIAGNOSIS — I10 Essential (primary) hypertension: Secondary | ICD-10-CM

## 2014-08-03 DIAGNOSIS — E1159 Type 2 diabetes mellitus with other circulatory complications: Secondary | ICD-10-CM

## 2014-08-03 DIAGNOSIS — E1151 Type 2 diabetes mellitus with diabetic peripheral angiopathy without gangrene: Secondary | ICD-10-CM

## 2014-08-03 DIAGNOSIS — I5032 Chronic diastolic (congestive) heart failure: Secondary | ICD-10-CM

## 2014-08-03 DIAGNOSIS — I2510 Atherosclerotic heart disease of native coronary artery without angina pectoris: Secondary | ICD-10-CM

## 2014-08-03 DIAGNOSIS — E782 Mixed hyperlipidemia: Secondary | ICD-10-CM

## 2014-08-03 NOTE — Patient Instructions (Signed)
Your physician recommends that you continue on your current medications as directed. Please refer to the Current Medication list given to you today.  Your physician wants you to follow-up in: 6 months. You will receive a reminder letter in the mail two months in advance. If you don't receive a letter, please call our office to schedule the follow-up appointment.  

## 2014-08-04 ENCOUNTER — Encounter: Payer: Medicare Other | Admitting: Family

## 2014-08-26 ENCOUNTER — Telehealth: Payer: Self-pay | Admitting: Family

## 2014-08-26 MED ORDER — OLOPATADINE HCL 0.1 % OP SOLN: 1.0000 [drp] | Freq: Two times a day (BID) | OPHTHALMIC | Status: DC

## 2014-08-26 NOTE — Telephone Encounter (Signed)
Pt needs refill on patanol 0.1% eye drops sent to [facility] [address]

## 2014-08-26 NOTE — Telephone Encounter (Signed)
Done

## 2014-08-31 ENCOUNTER — Encounter: Payer: Self-pay | Admitting: Family

## 2014-09-01 ENCOUNTER — Other Ambulatory Visit (HOSPITAL_COMMUNITY): Payer: Medicare Other

## 2014-09-01 ENCOUNTER — Ambulatory Visit: Payer: Medicare Other | Admitting: Family

## 2014-09-02 ENCOUNTER — Encounter: Payer: Self-pay | Admitting: Family

## 2014-09-02 ENCOUNTER — Ambulatory Visit (INDEPENDENT_AMBULATORY_CARE_PROVIDER_SITE_OTHER): Payer: Medicare Other | Admitting: Podiatry

## 2014-09-02 ENCOUNTER — Ambulatory Visit (INDEPENDENT_AMBULATORY_CARE_PROVIDER_SITE_OTHER): Payer: Medicare Other

## 2014-09-02 ENCOUNTER — Encounter: Payer: Self-pay | Admitting: Podiatry

## 2014-09-02 VITALS — BP 116/60 | HR 69 | Resp 16 | Ht 63.5 in | Wt 160.0 lb

## 2014-09-02 DIAGNOSIS — M2010 Hallux valgus (acquired), unspecified foot: Secondary | ICD-10-CM

## 2014-09-02 DIAGNOSIS — E119 Type 2 diabetes mellitus without complications: Secondary | ICD-10-CM

## 2014-09-02 DIAGNOSIS — Q828 Other specified congenital malformations of skin: Secondary | ICD-10-CM

## 2014-09-02 NOTE — Progress Notes (Signed)
   Subjective:    Patient ID: Penny James, female    DOB: May 16, 1951, 63 y.o.   MRN: [id number]  HPI Comments: Callus on the bottom of the right foot that is real painful and annoying. 5th met right foot. It has been there for about 3 months , it is real tender. Callus pads or corn remover was used with no success last blood sugar taken was yesterday evening and it was 97      Review of Systems  Endocrine:       Diabetes   All other systems reviewed and are negative.      Objective:   Physical Exam: I have reviewed her past medical history medications allergies surgery social history and review of systems. Pulses are strongly palpable bilateral. Neurologic sensorium is intact for signs once the monofilament. Deep tendon reflexes are intact bilateral muscle strength is 5 over 5 dorsiflexion plantar flexors and inverters and everters all intrinsic musculature is intact. Orthopedic evaluation demonstrates a prominent fifth metatarsal head to the right foot. Mild tailor's bunion deformity. Oral keratotic lesion is evident on the plantar aspect of the fifth metatarsal head of the right foot.        Assessment & Plan:  Assessment: Diabetes mellitus with porokeratosis plantar aspect fifth met right area mild tailor's bunion deformity right.  Plan: Debridement of reactive hyperkeratosis follow up with me on an as-needed basis.

## 2014-09-03 ENCOUNTER — Encounter: Payer: Self-pay | Admitting: Family

## 2014-09-03 ENCOUNTER — Ambulatory Visit (HOSPITAL_COMMUNITY)
Admission: RE | Admit: 2014-09-03 | Discharge: 2014-09-03 | Disposition: A | Discharge status: Home / Self Care | Payer: Medicare Other | Source: Ambulatory Visit | Attending: Vascular Surgery | Admitting: Vascular Surgery

## 2014-09-03 ENCOUNTER — Ambulatory Visit (INDEPENDENT_AMBULATORY_CARE_PROVIDER_SITE_OTHER): Payer: Medicare Other | Admitting: Family

## 2014-09-03 VITALS — BP 112/53 | HR 65 | Resp 16 | Ht 63.5 in | Wt 165.0 lb

## 2014-09-03 DIAGNOSIS — I6523 Occlusion and stenosis of bilateral carotid arteries: Secondary | ICD-10-CM | POA: Insufficient documentation

## 2014-09-03 DIAGNOSIS — Z48812 Encounter for surgical aftercare following surgery on the circulatory system: Secondary | ICD-10-CM | POA: Insufficient documentation

## 2014-09-03 DIAGNOSIS — I6529 Occlusion and stenosis of unspecified carotid artery: Secondary | ICD-10-CM | POA: Insufficient documentation

## 2014-09-03 DIAGNOSIS — I6521 Occlusion and stenosis of right carotid artery: Secondary | ICD-10-CM

## 2014-09-03 NOTE — Progress Notes (Signed)
Established Carotid Patient   History of Present Illness  Penny James is a 63 y.o. female patient of Dr. Donnetta Hutching who presents today for follow up s/p right carotid endarterectomy on 02/18/2012 and following the left carotid stenosis.   The patient denies any history of TIA or stroke symptoms, specifically the patient denies a history of amaurosis fugax or monocular blindness, denies a history unilateral  of facial drooping, denies a history of hemiplegia, and denies a history of receptive or expressive aphasia.   The patient denies claudication symptoms with walking, denies non healing wounds.   The patient denies New Medical or Surgical History.  Pt Diabetic: Yes, states she will soon be taken off her last DM oral medication and will be diet controlled Pt smoker: non-smoker  Pt meds include: Statin : Yes ASA: Yes Other anticoagulants/antiplatelets: Plavix   Past Medical History  Diagnosis Date  . Hyperlipidemia   . Hypertension   . Arthritis   . Anemia   . Ischemic cardiomyopathy     EF 55% by cath 2010  . Bipolar affective disorder   . GERD (gastroesophageal reflux disease)   . Obesity   . Peripheral neuropathy   . Depression   . Osteoarthritis   . 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 Bilateral carotid bruits     60-79% bilateral ICA steosis being followed by Dr Early not stent candidate, may need sx in future  . 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 PVD (peripheral vascular disease)     s/p PTA of left renal artery  . Coronary atherosclerosis     3 vessel s/p CABG - cath 2005 with widely patent grafts and distal disease past the grafts, cath 2010 with occluded SVG to IM and SVG to OM/PDA with PTCA of left circ and PCI of SVG to OM/PDA  . Carotid artery occlusion     60-79% bilateral ICA stenosis s/p Right CEA 2013  followed by Dr. Donnetta Hutching  . Chronic diastolic  CHF (congestive heart failure)     Social History History  Substance Use Topics  . Smoking status: Never Smoker   . Smokeless tobacco: Never Used  . Alcohol Use: No    Family History Family History  Problem Relation Age of Onset  . Heart disease Brother     Heart Disease before age 87  . Glaucoma Father   . Hypertension Father   . Stroke Paternal Grandmother   . Coronary artery disease Paternal Grandmother   . Cerebral aneurysm Brother   . Clotting disorder Sister     Surgical History Past Surgical History  Procedure Laterality Date  . Abdominal hysterectomy  1984  . Ptca  2003    with stent of left renal artery  . Tonsillectomy      AS A CHILD  . Endarterectomy  02/18/2012    Procedure: ENDARTERECTOMY CAROTID;  Surgeon: Rosetta Posner, MD;  Location: [facility] OR;  Service: Vascular;  Laterality: Right;  Right Carotid endarterectomy with Dacron patch angioplasty with resection of internal carotid artery  . Carotid endarterectomy  02/18/12    Right CEA  . Coronary artery bypass graft      3 vessel coronary disease S?P CABG with widely patent grafts by cath in 2005 and distal disease past the graft insertion sites.  . Cardiac catheterization  03/25/09    Occluded SVG to intermediate and SVG  to OM/posterior descending artery with PTCA left circ 6/10 s/p PTCA stent in SVG to OM/PDA, 07/2002 s/p PCI of left circ 03/2009  . Carotid endarterectomy Right 2013    Allergies  Allergen Reactions  . Penicillins Rash  . Protamine     Unknown reaction    Current Outpatient Prescriptions  Medication Sig Dispense Refill  . amLODipine (NORVASC) 5 MG tablet Take 1 tablet (5 mg total) by mouth daily. 90 tablet 1  . aspirin 81 MG tablet Take 81 mg by mouth daily.    [name] BAYER BREEZE 2 TEST DISK   0  . BAYER MICROLET LANCETS lancets Use as instructed 100 each 12  . carvedilol (COREG) 25 MG tablet Take 1 tablet (25 mg total) by mouth 2 (two) times daily with a meal. 180 tablet 1  . ezetimibe  (ZETIA) 10 MG tablet Take 1 tablet (10 mg total) by mouth daily. 90 tablet 1  . ferrous sulfate 325 (65 FE) MG tablet Take 325 mg by mouth daily with breakfast.    . furosemide (LASIX) 40 MG tablet Take 1 tablet (40 mg total) by mouth daily. 90 tablet 1  . glimepiride (AMARYL) 1 MG tablet Take 1 tablet (1 mg total) by mouth daily with breakfast. 30 tablet 1  . Glucose Blood (BLOOD GLUCOSE TEST STRIPS) STRP Use to check blood sugar once daily 100 each 0  . irbesartan (AVAPRO) 150 MG tablet Take 1 tablet (150 mg total) by mouth every morning. 90 tablet 1  . Multiple Vitamins-Minerals (MULTIVITAL) tablet Take 1 tablet by mouth daily.     [name] olopatadine (PATANOL) 0.1 % ophthalmic solution Place 1 drop into both eyes 2 (two) times daily. 5 mL 1  . PLAVIX 75 MG tablet TAKE 1 TABLET BY MOUTH DAILY 90 tablet 0  . PREVACID 15 MG capsule take 1 capsule by mouth once daily 90 capsule 3  . rosuvastatin (CRESTOR) 40 MG tablet Take 1 tablet (40 mg total) by mouth daily. 90 tablet 1   No current facility-administered medications for this visit.    Review of Systems : See HPI for pertinent positives and negatives.  Physical Examination  Filed Vitals:   09/03/14 1020 09/03/14 1023  BP: 120/60 112/53  Pulse: 65 65  Resp:  16  Height:  5' 3.5" (1.613 m)  Weight:  165 lb (74.844 kg)  SpO2:  100%   Body mass index is 28.77 kg/(m^2).  General: WDWN female in NAD GAIT: normal Eyes: PERRLA Pulmonary:  Non-labored, CTAB, Negative  Rales, Negative rhonchi, & Negative wheezing.  Cardiac: regular Rhythm,  Negative detected murmur.  VASCULAR EXAM Carotid Bruits Right Left   Positive Positive    Aorta is not palpable. Radial pulses are 2+ palpable and equal.                                                                                                                            LE Pulses Right Left  POPLITEAL  not palpable   not palpable       POSTERIOR TIBIAL  not palpable   not palpable         DORSALIS PEDIS      ANTERIOR TIBIAL not palpable  not palpable     Gastrointestinal: soft, nontender, BS WNL, no r/g,  negative palpated masses.  Musculoskeletal: Negative muscle atrophy/wasting. M/S 5/5 throughout, Extremities without ischemic changes.  Neurologic: A&O X 3; Appropriate Affect ; SENSATION ;normal;  Speech is normal CN 2-12 intact, Pain and light touch intact in extremities, Motor exam as listed above.   Non-Invasive Vascular Imaging CAROTID DUPLEX 09/03/2014   CEREBROVASCULAR DUPLEX EVALUATION    INDICATION: Follow-up carotid disease     PREVIOUS INTERVENTION(S): Right carotid endarterectomy 02/18/2012    DUPLEX EXAM:     RIGHT  LEFT  Peak Systolic Velocities (cm/s) End Diastolic Velocities (cm/s) Plaque LOCATION Peak Systolic Velocities (cm/s) End Diastolic Velocities (cm/s) Plaque  83 18  CCA PROXIMAL 97 25   89 21  CCA MID 142 48   70 16  CCA DISTAL 138 38   145 0  ECA 429 0   99 25  ICA PROXIMAL 312 123 CP  196 68 HT/CP ICA MID 200 63   101 18  ICA DISTAL 151 44     NA ICA / CCA Ratio (PSV) 2.3  Antegrade  Vertebral Flow Antegrade   242 Brachial Systolic Pressure (mmHg) 353  Within normal limits  Brachial Artery Waveforms Within normal limits     Plaque Morphology:  HM = Homogeneous, HT = Heterogeneous, CP = Calcific Plaque, SP = Smooth Plaque, IP = Irregular Plaque  ADDITIONAL FINDINGS:     IMPRESSION: 1. Patent right carotid endarterectomy with evidence of significant (60%-79%) restenosis of the distal patch. Plaque is calcific and difficult to fully evaluate. 2. Evidence of critical (80%-99%) stenosis of the left internal carotid artery; however, plaque is densely calcific with mild tortuosity; unable to confirm accurate Doppler angle which may misrepresent velocities. 3. Right vertebral artery appears occluded with distal reconstitution. 4. Left vertebral artery is antegrade.    Compared to the previous exam:  Bilateral internal carotid  artery velocities have increased.      Assessment: CATALEA LABRECQUE is a 63 y.o. female who is s/p right carotid endarterectomy on 02/18/2012 and has known left carotid stenosis. She has no history of stroke or TIA. Today's carotid Duplex reveals a patent right carotid endarterectomy with evidence of significant (60%-79%) restenosis of the distal patch. Plaque is calcific and difficult to fully evaluate. Evidence of critical (80%-99%) stenosis of the left internal carotid artery; however, plaque is densely calcific with mild tortuosity; unable to confirm accurate Doppler angle which may misrepresent velocities. Right vertebral artery appears occluded with distal reconstitution. Left vertebral artery is antegrade. Bilateral internal carotid artery velocities have increased.    Plan: Based on today's carotid Duplex results, HPI, and physical exam, and after discussing with Dr. Donnetta Hutching, pt will be scheduled for a CTA of the neck and follow up with Dr. Donnetta Hutching in 1-2 weeks.   I discussed in depth with the patient the nature of atherosclerosis, and emphasized the importance of maximal medical management including strict control of blood pressure, blood glucose, and lipid levels, obtaining regular exercise, and continued cessation of smoking.  The patient is aware that without maximal medical management the underlying atherosclerotic disease process will progress, limiting the benefit of any interventions. The patient was given information about stroke prevention  and what symptoms should prompt the patient to seek immediate medical care. Thank you for allowing [place] to participate in this patient's care.  Clemon Chambers, RN, MSN, FNP-C [facility] Office: [phone]  Clinic Physician: Early on call  09/03/2014 10:42 AM

## 2014-09-03 NOTE — Patient Instructions (Signed)
Stroke Prevention Some medical conditions and behaviors are associated with an increased chance of having a stroke. You may prevent a stroke by making healthy choices and managing medical conditions. HOW CAN I REDUCE MY RISK OF HAVING A STROKE?   Stay physically active. Get at least 30 minutes of activity on most or all days.  Do not smoke. It may also be helpful to avoid exposure to secondhand smoke.  Limit alcohol use. Moderate alcohol use is considered to be:  No more than 2 drinks per day for men.  No more than 1 drink per day for nonpregnant women.  Eat healthy foods. This involves:  Eating 5 or more servings of fruits and vegetables a day.  Making dietary changes that address high blood pressure (hypertension), high cholesterol, diabetes, or obesity.  Manage your cholesterol levels.  Making food choices that are high in fiber and low in saturated fat, trans fat, and cholesterol may control cholesterol levels.  Take any prescribed medicines to control cholesterol as directed by your health care provider.  Manage your diabetes.  Controlling your carbohydrate and sugar intake is recommended to manage diabetes.  Take any prescribed medicines to control diabetes as directed by your health care provider.  Control your hypertension.  Making food choices that are low in salt (sodium), saturated fat, trans fat, and cholesterol is recommended to manage hypertension.  Take any prescribed medicines to control hypertension as directed by your health care provider.  Maintain a healthy weight.  Reducing calorie intake and making food choices that are low in sodium, saturated fat, trans fat, and cholesterol are recommended to manage weight.  Stop drug abuse.  Avoid taking birth control pills.  Talk to your health care provider about the risks of taking birth control pills if you are over 35 years old, smoke, get migraines, or have ever had a blood clot.  Get evaluated for sleep  disorders (sleep apnea).  Talk to your health care provider about getting a sleep evaluation if you snore a lot or have excessive sleepiness.  Take medicines only as directed by your health care provider.  For some people, aspirin or blood thinners (anticoagulants) are helpful in reducing the risk of forming abnormal blood clots that can lead to stroke. If you have the irregular heart rhythm of atrial fibrillation, you should be on a blood thinner unless there is a good reason you cannot take them.  Understand all your medicine instructions.  Make sure that other conditions (such as anemia or atherosclerosis) are addressed. SEEK IMMEDIATE MEDICAL CARE IF:   You have sudden weakness or numbness of the face, arm, or leg, especially on one side of the body.  Your face or eyelid droops to one side.  You have sudden confusion.  You have trouble speaking (aphasia) or understanding.  You have sudden trouble seeing in one or both eyes.  You have sudden trouble walking.  You have dizziness.  You have a loss of balance or coordination.  You have a sudden, severe headache with no known cause.  You have new chest pain or an irregular heartbeat. Any of these symptoms may represent a serious problem that is an emergency. Do not wait to see if the symptoms will go away. Get medical help at once. Call your local emergency services (911 in [place] Do not drive yourself to the hospital. Document Released: 11/22/2004 Document Revised: 03/01/2014 Document Reviewed: 04/17/2013 Exit[employer] Patient Information 2015 [employer]. This information is not intended to replace advice given   to you by your health care provider. Make sure you discuss any questions you have with your health care provider.  

## 2014-09-08 ENCOUNTER — Other Ambulatory Visit: Payer: Self-pay

## 2014-09-08 MED ORDER — AMLODIPINE BESYLATE 5 MG PO TABS: 5.0000 mg | ORAL_TABLET | Freq: Every day | ORAL | Status: DC

## 2014-09-14 ENCOUNTER — Telehealth: Payer: Self-pay | Admitting: Family

## 2014-09-14 ENCOUNTER — Other Ambulatory Visit: Payer: Self-pay | Admitting: Family

## 2014-09-14 MED ORDER — CARVEDILOL 25 MG PO TABS: 25.0000 mg | ORAL_TABLET | Freq: Two times a day (BID) | ORAL | Status: DC

## 2014-09-14 NOTE — Telephone Encounter (Signed)
30 day supply only until OV

## 2014-09-14 NOTE — Telephone Encounter (Signed)
Done

## 2014-09-14 NOTE — Telephone Encounter (Signed)
Please fill name brand only Coreg 90 DS at [facility] on [address] please. Pt states she is out.

## 2014-09-14 NOTE — Telephone Encounter (Signed)
Pt needs the rx carvedilol (COREG) 25 MG tablet sent to  [facility] [address]. [facility]  Can you resend please? 90 day

## 2014-09-15 ENCOUNTER — Telehealth: Payer: Self-pay | Admitting: Family

## 2014-09-15 NOTE — Telephone Encounter (Signed)
Noted  

## 2014-09-15 NOTE — Telephone Encounter (Signed)
Pt said all her rx should go to [place] with the exception of AMARYL 1 MG tablet.  Pt said AMARYL is the only [facility] picks [facility] at [facility]

## 2014-09-17 ENCOUNTER — Other Ambulatory Visit: Payer: Self-pay

## 2014-09-17 ENCOUNTER — Telehealth: Payer: Self-pay | Admitting: Family

## 2014-09-17 MED ORDER — CARVEDILOL 25 MG PO TABS: 25.0000 mg | ORAL_TABLET | Freq: Two times a day (BID) | ORAL | Status: DC

## 2014-09-17 NOTE — Telephone Encounter (Signed)
Spoke with pt and explained that [facility]g was [facility], however that Rx was canceled and sent to [facility] for a 30 day supply because pt needs a f/u. Appointment was scheduled and Coreg was filled for 90 day supply by Cardiology Dr. Radford Pax. Pt is aware

## 2014-09-17 NOTE — Telephone Encounter (Signed)
Pt came in to say that all her meds shoulds be a 90 day supply with the exception of AMARYL 1 MG tablet   And all her meds should be called into [place] with the exception of AMARYL 1 MG tablet is [facility] be [facility].   Pt request the following med  carvedilol (COREG) 25 MG tablet called in to [facility] [address] today as a 90 day supply

## 2014-09-21 ENCOUNTER — Other Ambulatory Visit: Payer: Self-pay | Admitting: Vascular Surgery

## 2014-09-21 LAB — BUN: BUN: 16 mg/dL (ref 6–23)

## 2014-09-21 LAB — CREATININE, SERUM: Creat: 0.83 mg/dL (ref 0.50–1.10)

## 2014-09-24 ENCOUNTER — Other Ambulatory Visit: Payer: Self-pay | Admitting: *Deleted

## 2014-09-24 MED ORDER — FUROSEMIDE 40 MG PO TABS: 40.0000 mg | ORAL_TABLET | Freq: Every day | ORAL | Status: DC

## 2014-09-24 MED ORDER — IRBESARTAN 150 MG PO TABS: 150.0000 mg | ORAL_TABLET | Freq: Every morning | ORAL | Status: DC

## 2014-09-24 MED ORDER — CLOPIDOGREL BISULFATE 75 MG PO TABS: 75.0000 mg | ORAL_TABLET | Freq: Every day | ORAL | Status: DC

## 2014-09-24 MED ORDER — ROSUVASTATIN CALCIUM 40 MG PO TABS: 40.0000 mg | ORAL_TABLET | Freq: Every day | ORAL | Status: DC

## 2014-09-27 ENCOUNTER — Encounter: Payer: Self-pay | Admitting: Vascular Surgery

## 2014-09-28 ENCOUNTER — Encounter: Payer: Self-pay | Admitting: Vascular Surgery

## 2014-09-28 ENCOUNTER — Ambulatory Visit (INDEPENDENT_AMBULATORY_CARE_PROVIDER_SITE_OTHER): Payer: Medicare Other | Admitting: Vascular Surgery

## 2014-09-28 ENCOUNTER — Ambulatory Visit
Admission: RE | Admit: 2014-09-28 | Discharge: 2014-09-28 | Disposition: A | Discharge status: Home / Self Care | Payer: Medicare Other | Source: Ambulatory Visit | Attending: Family | Admitting: Family

## 2014-09-28 VITALS — BP 121/61 | HR 66 | Resp 18 | Ht 63.0 in | Wt 156.8 lb

## 2014-09-28 DIAGNOSIS — I6521 Occlusion and stenosis of right carotid artery: Secondary | ICD-10-CM

## 2014-09-28 DIAGNOSIS — Z48812 Encounter for surgical aftercare following surgery on the circulatory system: Secondary | ICD-10-CM

## 2014-09-28 DIAGNOSIS — I6523 Occlusion and stenosis of bilateral carotid arteries: Secondary | ICD-10-CM

## 2014-09-28 MED ORDER — IOHEXOL 350 MG/ML SOLN
65.0000 mL | Freq: Once | INTRAVENOUS | Status: AC | PRN
  Administered 2014-09-28: 65 mL via INTRAVENOUS

## 2014-09-28 NOTE — Progress Notes (Signed)
Here today for discussion of CT angiogram of her carotid arteries this morning. She had been seen in our office on 09/03/2014. At that time her follow-up duplex suggested some potential significant stenosis in her right carotid endarterectomy site and also evidence of critical stenosis of left internal carotid artery. She has extreme calcification duplex somewhat difficult. She therefore underwent CT angiogram for further evaluation. She had had CT angiogram prior to her surgery in 2013 as well for the same reason. She remains completely asymptomatic  Past Medical History  Diagnosis Date  . Hyperlipidemia   . Hypertension   . Arthritis   . Anemia   . Ischemic cardiomyopathy     EF 55% by cath 2010  . Bipolar affective disorder   . GERD (gastroesophageal reflux disease)   . Obesity   . Peripheral neuropathy   . Depression   . Osteoarthritis   . 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 Bilateral carotid bruits     60-79% bilateral ICA steosis being followed by Dr Eular Panek not stent candidate, may need sx in future  . 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 PVD (peripheral vascular disease)     s/p PTA of left renal artery  . Coronary atherosclerosis     3 vessel s/p CABG - cath 2005 with widely patent grafts and distal disease past the grafts, cath 2010 with occluded SVG to IM and SVG to OM/PDA with PTCA of left circ and PCI of SVG to OM/PDA  . Carotid artery occlusion     60-79% bilateral ICA stenosis s/p Right CEA 2013  followed by Dr. Donnetta Hutching  . Chronic diastolic CHF (congestive heart failure)     History  Substance Use Topics  . Smoking status: Never Smoker   . Smokeless tobacco: Never Used  . Alcohol Use: No    Family History  Problem Relation Age of Onset  . Heart disease Brother     Heart Disease before age 15  . Glaucoma Father   . Hypertension Father   . Stroke Paternal  Grandmother   . Coronary artery disease Paternal Grandmother   . Cerebral aneurysm Brother   . Clotting disorder Sister     Allergies  Allergen Reactions  . Penicillins Rash  . Protamine     Unknown reaction    Current outpatient prescriptions: AMARYL 1 MG tablet, TAKE 1 TABLET EVERY DAY WITH BREAKFAST, Disp: 30 tablet, Rfl: 0;  amLODipine (NORVASC) 5 MG tablet, Take 1 tablet (5 mg total) by mouth daily., Disp: 90 tablet, Rfl: 3;  aspirin 81 MG tablet, Take 81 mg by mouth daily., Disp: , Rfl: ;  BAYER BREEZE 2 TEST DISK, , Disp: , Rfl: 0;  BAYER MICROLET LANCETS lancets, Use as instructed, Disp: 100 each, Rfl: 12 carvedilol (COREG) 25 MG tablet, Take 1 tablet (25 mg total) by mouth 2 (two) times daily with a meal., Disp: 180 tablet, Rfl: 0;  clopidogrel (PLAVIX) 75 MG tablet, Take 1 tablet (75 mg total) by mouth daily., Disp: 90 tablet, Rfl: 1;  ezetimibe (ZETIA) 10 MG tablet, Take 1 tablet (10 mg total) by mouth daily., Disp: 90 tablet, Rfl: 1;  ferrous sulfate 325 (65 FE) MG tablet, Take 325 mg by mouth daily with breakfast., Disp: , Rfl:  furosemide (LASIX) 40 MG tablet, Take 1 tablet (40 mg total) by mouth daily., Disp: 90 tablet, Rfl: 1;  Glucose  Blood (BLOOD GLUCOSE TEST STRIPS) STRP, Use to check blood sugar once daily, Disp: 100 each, Rfl: 0;  irbesartan (AVAPRO) 150 MG tablet, Take 1 tablet (150 mg total) by mouth every morning., Disp: 90 tablet, Rfl: 1;  Multiple Vitamins-Minerals (MULTIVITAL) tablet, Take 1 tablet by mouth daily. , Disp: , Rfl:  olopatadine (PATANOL) 0.1 % ophthalmic solution, Place 1 drop into both eyes 2 (two) times daily., Disp: 5 mL, Rfl: 1;  PREVACID 15 MG capsule, take 1 capsule by mouth once daily, Disp: 90 capsule, Rfl: 3;  rosuvastatin (CRESTOR) 40 MG tablet, Take 1 tablet (40 mg total) by mouth daily., Disp: 90 tablet, Rfl: 1  BP 121/61 mmHg  Pulse 66  Resp 18  Ht 5\' 3"  (1.6 m)  Wt 156 lb 12.8 oz (71.124 kg)  BMI 27.78 kg/m2  Body mass index is 27.78  kg/(m^2).       This exam is unchanged. She has no carotid bruits bilaterally in her right neck incision is well-healed She is grossly intact neurologically  I did review her carotid CT angiogram of her neck today. This shows proximal 45% stenosis of her distal endarterectomy site with no critical stenosis. She's had no change in her left carotid anatomy since her prior CT angiogram 2013 with a predicted 70% stenosis. She has a known left vertebral artery occlusion.  I discussed this at length with Ms. Burandt explain this does not put her any significant increased risk for stroke and therefore would continue duplex follow-up. I did explain that the degree of calcification makes ultrasound somewhat difficult to predict her level of stenosis but  We now have a baseline ultrasound and CT angiogram for comparison. She'll notify us should she develop any neurologic deficits. Otherwise we will see her again in 6 months with carotid duplex

## 2014-09-28 NOTE — Addendum Note (Signed)
Addended by: Mena Goes on: 09/28/2014 05:16 PM   Modules accepted: Orders

## 2014-09-30 ENCOUNTER — Other Ambulatory Visit: Payer: Self-pay

## 2014-09-30 MED ORDER — EZETIMIBE 10 MG PO TABS: 10.0000 mg | ORAL_TABLET | Freq: Every day | ORAL | Status: DC

## 2014-09-30 MED ORDER — ROSUVASTATIN CALCIUM 40 MG PO TABS: 40.0000 mg | ORAL_TABLET | Freq: Every day | ORAL | Status: DC

## 2014-09-30 MED ORDER — CARVEDILOL 25 MG PO TABS: 25.0000 mg | ORAL_TABLET | Freq: Two times a day (BID) | ORAL | Status: DC

## 2014-10-01 ENCOUNTER — Other Ambulatory Visit: Payer: Self-pay | Admitting: Family

## 2014-10-04 ENCOUNTER — Ambulatory Visit (INDEPENDENT_AMBULATORY_CARE_PROVIDER_SITE_OTHER): Payer: Medicare Other | Admitting: Family

## 2014-10-04 ENCOUNTER — Telehealth: Payer: Self-pay | Admitting: Family

## 2014-10-04 ENCOUNTER — Encounter: Payer: Self-pay | Admitting: Family

## 2014-10-04 VITALS — BP 122/80 | HR 58 | Ht 63.0 in | Wt 163.0 lb

## 2014-10-04 DIAGNOSIS — Z23 Encounter for immunization: Secondary | ICD-10-CM

## 2014-10-04 DIAGNOSIS — E7800 Pure hypercholesterolemia, unspecified: Secondary | ICD-10-CM

## 2014-10-04 DIAGNOSIS — E119 Type 2 diabetes mellitus without complications: Secondary | ICD-10-CM

## 2014-10-04 DIAGNOSIS — Z0000 Encounter for general adult medical examination without abnormal findings: Secondary | ICD-10-CM

## 2014-10-04 DIAGNOSIS — I10 Essential (primary) hypertension: Secondary | ICD-10-CM

## 2014-10-04 LAB — HEMOGLOBIN A1C: Hgb A1c MFr Bld: 6.6 % — ABNORMAL HIGH (ref 4.6–6.5)

## 2014-10-04 LAB — CBC WITH DIFFERENTIAL/PLATELET
Basophils Absolute: 0 10*3/uL (ref 0.0–0.1)
Basophils Relative: 0.7 % (ref 0.0–3.0)
EOS ABS: 0.1 10*3/uL (ref 0.0–0.7)
Eosinophils Relative: 3.6 % (ref 0.0–5.0)
HCT: 33.1 % — ABNORMAL LOW (ref 36.0–46.0)
Hemoglobin: 11.1 g/dL — ABNORMAL LOW (ref 12.0–15.0)
LYMPHS ABS: 1.4 10*3/uL (ref 0.7–4.0)
Lymphocytes Relative: 41.1 % (ref 12.0–46.0)
MCHC: 33.6 g/dL (ref 30.0–36.0)
MCV: 86.5 fl (ref 78.0–100.0)
MONO ABS: 0.3 10*3/uL (ref 0.1–1.0)
Monocytes Relative: 8.4 % (ref 3.0–12.0)
NEUTROS PCT: 46.2 % (ref 43.0–77.0)
Neutro Abs: 1.5 10*3/uL (ref 1.4–7.7)
Platelets: 168 10*3/uL (ref 150.0–400.0)
RBC: 3.83 Mil/uL — ABNORMAL LOW (ref 3.87–5.11)
RDW: 13.5 % (ref 11.5–15.5)
WBC: 3.3 10*3/uL — ABNORMAL LOW (ref 4.0–10.5)

## 2014-10-04 LAB — HEPATIC FUNCTION PANEL
ALBUMIN: 4.1 g/dL (ref 3.5–5.2)
ALT: 30 U/L (ref 0–35)
AST: 25 U/L (ref 0–37)
Alkaline Phosphatase: 56 U/L (ref 39–117)
Bilirubin, Direct: 0.1 mg/dL (ref 0.0–0.3)
Total Bilirubin: 0.9 mg/dL (ref 0.2–1.2)
Total Protein: 6.6 g/dL (ref 6.0–8.3)

## 2014-10-04 LAB — BASIC METABOLIC PANEL
BUN: 12 mg/dL (ref 6–23)
CO2: 27 mEq/L (ref 19–32)
CREATININE: 0.8 mg/dL (ref 0.4–1.2)
Calcium: 9.1 mg/dL (ref 8.4–10.5)
Chloride: 96 mEq/L (ref 96–112)
GFR: 95.06 mL/min (ref >60.00)
GLUCOSE: 114 mg/dL — AB (ref 70–99)
Potassium: 4 mEq/L (ref 3.5–5.1)
Sodium: 129 mEq/L — ABNORMAL LOW (ref 135–145)

## 2014-10-04 LAB — POCT URINALYSIS DIPSTICK
Bilirubin, UA: NEGATIVE
Glucose, UA: NEGATIVE
Ketones, UA: NEGATIVE
LEUKOCYTES UA: NEGATIVE
NITRITE UA: NEGATIVE
PH UA: 7
PROTEIN UA: NEGATIVE
RBC UA: NEGATIVE
Spec Grav, UA: 1.01
Urobilinogen, UA: 0.2

## 2014-10-04 LAB — MICROALBUMIN / CREATININE URINE RATIO
Creatinine,U: 9 mg/dL
Microalb Creat Ratio: 3.3 mg/g (ref 0.0–30.0)
Microalb, Ur: 0.3 mg/dL (ref 0.0–1.9)

## 2014-10-04 LAB — TSH: TSH: 1.78 u[IU]/mL (ref 0.35–4.50)

## 2014-10-04 NOTE — Patient Instructions (Signed)
Diabetes and Exercise Exercising regularly is important. It is not just about losing weight. It has many health benefits, such as:  Improving your overall fitness, flexibility, and endurance.  Increasing your bone density.  Helping with weight control.  Decreasing your body fat.  Increasing your muscle strength.  Reducing stress and tension.  Improving your overall health. People with diabetes who exercise gain additional benefits because exercise:  Reduces appetite.  Improves the body's use of blood sugar (glucose).  Helps lower or control blood glucose.  Decreases blood pressure.  Helps control blood lipids (such as cholesterol and triglycerides).  Improves the body's use of the hormone insulin by:  Increasing the body's insulin sensitivity.  Reducing the body's insulin needs.  Decreases the risk for heart disease because exercising:  Lowers cholesterol and triglycerides levels.  Increases the levels of good cholesterol (such as high-density lipoproteins [HDL]) in the body.  Lowers blood glucose levels. YOUR ACTIVITY PLAN  Choose an activity that you enjoy and set realistic goals. Your health care provider or diabetes educator can help you make an activity plan that works for you. Exercise regularly as directed by your health care provider. This includes:  Performing resistance training twice a week such as push-ups, sit-ups, lifting weights, or using resistance bands.  Performing 150 minutes of cardio exercises each week such as walking, running, or playing sports.  Staying active and spending no more than 90 minutes at one time being inactive. Even short bursts of exercise are good for you. Three 10-minute sessions spread throughout the day are just as beneficial as a single 30-minute session. Some exercise ideas include:  Taking the dog for a walk.  Taking the stairs instead of the elevator.  Dancing to your favorite song.  Doing an exercise  video.  Doing your favorite exercise with a friend. RECOMMENDATIONS FOR EXERCISING WITH TYPE 1 OR TYPE 2 DIABETES   Check your blood glucose before exercising. If blood glucose levels are greater than 240 mg/dL, check for urine ketones. Do not exercise if ketones are present.  Avoid injecting insulin into areas of the body that are going to be exercised. For example, avoid injecting insulin into:  The arms when playing tennis.  The legs when jogging.  Keep a record of:  Food intake before and after you exercise.  Expected peak times of insulin action.  Blood glucose levels before and after you exercise.  The type and amount of exercise you have done.  Review your records with your health care provider. Your health care provider will help you to develop guidelines for adjusting food intake and insulin amounts before and after exercising.  If you take insulin or oral hypoglycemic agents, watch for signs and symptoms of hypoglycemia. They include:  Dizziness.  Shaking.  Sweating.  Chills.  Confusion.  Drink plenty of water while you exercise to prevent dehydration or heat stroke. Body water is lost during exercise and must be replaced.  Talk to your health care provider before starting an exercise program to make sure it is safe for you. Remember, almost any type of activity is better than none. Document Released: 01/05/2004 Document Revised: 03/01/2014 Document Reviewed: 03/24/2013 ExitCare Patient Information 2015 [employer]. This information is not intended to replace advice given to you by your health care provider. Make sure you discuss any questions you have with your health care provider.  

## 2014-10-04 NOTE — Progress Notes (Signed)
Subjective:    Patient ID: Penny James, female    DOB: 1951-02-05, 63 y.o.   MRN: [id number]  HPI 63 year old AAF nonsmoker with a history of type 2 diabetes, hypertension, hyperlipidemia, allergic rhinitis. Last mammogram was March 2015. Colonoscopy she reports was approximately 5 years ago and was normal. No family history of colon cancer. Had a partial hysterectomy without oophorectomy in the 80s.  I reviewed all health maintenance protocols including mammography, colonoscopy, bone density Needed referrals were placed. Age and diagnosis  appropriate screening labs were ordered. Her immunization history was reviewed and appropriate vaccinations were ordered. Her current medications and allergies were reviewed and needed refills of her chronic medications were ordered. The plan for yearly health maintenance was discussed all orders and referrals were made as appropriate.   Review of Systems  Constitutional: Negative.   HENT: Negative.   Eyes: Negative.   Respiratory: Negative.   Cardiovascular: Negative.   Gastrointestinal: Negative.   Endocrine: Negative.   Genitourinary: Negative.   Musculoskeletal: Negative.   Skin: Negative.   Allergic/Immunologic: Negative.   Neurological: Negative.   Hematological: Negative.   Psychiatric/Behavioral: Negative.    Past Medical History  Diagnosis Date  . Hyperlipidemia   . Hypertension   . Arthritis   . Anemia   . Ischemic cardiomyopathy     EF 55% by cath 2010  . Bipolar affective disorder   . GERD (gastroesophageal reflux disease)   . Obesity   . Peripheral neuropathy   . Depression   . Osteoarthritis   . 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 Bilateral carotid bruits     60-79% bilateral ICA steosis being followed by Dr Early not stent candidate, may need sx in future  . 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 PVD  (peripheral vascular disease)     s/p PTA of left renal artery  . Coronary atherosclerosis     3 vessel s/p CABG - cath 2005 with widely patent grafts and distal disease past the grafts, cath 2010 with occluded SVG to IM and SVG to OM/PDA with PTCA of left circ and PCI of SVG to OM/PDA  . Carotid artery occlusion     60-79% bilateral ICA stenosis s/p Right CEA 2013  followed by Dr. Donnetta Hutching  . Chronic diastolic CHF (congestive heart failure)     History   Social History  . Marital Status: Married    Spouse Name: N/A    Number of Children: 3  . Years of Education: N/A   Occupational History  . DISABLED    Social History Main Topics  . Smoking status: Never Smoker   . Smokeless tobacco: Never Used  . Alcohol Use: No  . Drug Use: No  . Sexual Activity: No   Other Topics Concern  . Not on file   Social History Narrative    Past Surgical History  Procedure Laterality Date  . Abdominal hysterectomy  1984  . Ptca  2003    with stent of left renal artery  . Tonsillectomy      AS A CHILD  . Endarterectomy  02/18/2012    Procedure: ENDARTERECTOMY CAROTID;  Surgeon: Rosetta Posner, MD;  Location: [facility] OR;  Service: Vascular;  Laterality: Right;  Right Carotid endarterectomy with Dacron patch angioplasty with resection of internal carotid artery  . Coronary artery bypass graft  3 vessel coronary disease S?P CABG with widely patent grafts by cath in 2005 and distal disease past the graft insertion sites.  . Cardiac catheterization  03/25/09    Occluded SVG to intermediate and SVG to OM/posterior descending artery with PTCA left circ 6/10 s/p PTCA stent in SVG to OM/PDA, 07/2002 s/p PCI of left circ 03/2009  . Carotid endarterectomy  02/18/12    Right CEA    Family History  Problem Relation Age of Onset  . Heart disease Brother     Heart Disease before age 37  . Glaucoma Father   . Hypertension Father   . Stroke Paternal Grandmother   . Coronary artery disease Paternal Grandmother    . Cerebral aneurysm Brother   . Clotting disorder Sister     Allergies  Allergen Reactions  . Penicillins Rash  . Protamine     Unknown reaction    Current Outpatient Prescriptions on File Prior to Visit  Medication Sig Dispense Refill  . AMARYL 1 MG tablet TAKE 1 TABLET EVERY DAY WITH BREAKFAST 30 tablet 0  . amLODipine (NORVASC) 5 MG tablet Take 1 tablet (5 mg total) by mouth daily. 90 tablet 3  . aspirin 81 MG tablet Take 81 mg by mouth daily.    [name] BAYER BREEZE 2 TEST DISK   0  . BAYER BREEZE 2 TEST DISK TEST BLOOD SUGAR ONCE A DAY 100 each 11  . BAYER MICROLET LANCETS lancets Use as instructed 100 each 12  . carvedilol (COREG) 25 MG tablet Take 1 tablet (25 mg total) by mouth 2 (two) times daily with a meal. 180 tablet 1  . clopidogrel (PLAVIX) 75 MG tablet Take 1 tablet (75 mg total) by mouth daily. 90 tablet 1  . ezetimibe (ZETIA) 10 MG tablet Take 1 tablet (10 mg total) by mouth daily. 90 tablet 1  . ferrous sulfate 325 (65 FE) MG tablet Take 325 mg by mouth daily with breakfast.    . furosemide (LASIX) 40 MG tablet Take 1 tablet (40 mg total) by mouth daily. 90 tablet 1  . irbesartan (AVAPRO) 150 MG tablet Take 1 tablet (150 mg total) by mouth every morning. 90 tablet 1  . Multiple Vitamins-Minerals (MULTIVITAL) tablet Take 1 tablet by mouth daily.     [name] olopatadine (PATANOL) 0.1 % ophthalmic solution Place 1 drop into both eyes 2 (two) times daily. 5 mL 1  . PREVACID 15 MG capsule take 1 capsule by mouth once daily 90 capsule 3  . rosuvastatin (CRESTOR) 40 MG tablet Take 1 tablet (40 mg total) by mouth daily. 90 tablet 1   No current facility-administered medications on file prior to visit.    BP 122/80 mmHg  Pulse 58  Ht 5\' 3"  (1.6 m)  Wt 163 lb (73.936 kg)  BMI 28.88 kg/m2chart    Objective:   Physical Exam  Constitutional: She is oriented to person, place, and time. She appears well-developed and well-nourished.  HENT:  Head: Normocephalic and atraumatic.    Right Ear: External ear normal.  Left Ear: External ear normal.  Nose: Nose normal.  Mouth/Throat: Oropharynx is clear and moist.  Eyes: Conjunctivae and EOM are normal. Pupils are equal, round, and reactive to light.  Neck: Normal range of motion. Neck supple. No thyromegaly present.  Cardiovascular: Normal rate, regular rhythm and normal heart sounds.   Pulmonary/Chest: Effort normal and breath sounds normal.  Abdominal: Soft. Bowel sounds are normal.  Musculoskeletal: Normal range of motion. She exhibits no  edema or tenderness.  Neurological: She is alert and oriented to person, place, and time. She has normal reflexes. She displays normal reflexes. No cranial nerve deficit. Coordination normal.  Skin: Skin is warm and dry.  Psychiatric: She has a normal mood and affect.          Assessment & Plan:  Lonni was seen today for annual exam.  Diagnoses and associated orders for this visit:  Medicare annual wellness visit, subsequent - Hemoglobin A1c - Hepatic Function Panel - Basic Metabolic Panel - CBC with Differential - TSH - POC Urinalysis Dipstick - Microalbumin/Creatinine Ratio, Urine  Essential hypertension - Hemoglobin A1c - Hepatic Function Panel - Basic Metabolic Panel - CBC with Differential - TSH - POC Urinalysis Dipstick - Microalbumin/Creatinine Ratio, Urine  Type 2 diabetes mellitus without complication - Hemoglobin A1c - Hepatic Function Panel - Basic Metabolic Panel - CBC with Differential - TSH - POC Urinalysis Dipstick - Microalbumin/Creatinine Ratio, Urine  Pure hypercholesterolemia - Hemoglobin A1c - Hepatic Function Panel - Basic Metabolic Panel - CBC with Differential - TSH - POC Urinalysis Dipstick - Microalbumin/Creatinine Ratio, Urine    Advised patient colonoscopies I recommended every 10 years. Mammograms annually, but newest recommendation every 2 years.

## 2014-10-04 NOTE — Telephone Encounter (Signed)
Please advise 

## 2014-10-04 NOTE — Progress Notes (Signed)
Pre visit review using our clinic review tool, if applicable. No additional management support is needed unless otherwise documented below in the visit note. 

## 2014-10-04 NOTE — Telephone Encounter (Signed)
Patient states she was just seen and would like a note excusing her from jury duty on 10/11/14. She says she can't sit in jury duty "feeling like that".

## 2014-10-04 NOTE — Addendum Note (Signed)
Addended by: Santiago Bumpers on: 10/04/2014 09:00 AM   Modules accepted: Orders

## 2014-10-05 NOTE — Telephone Encounter (Signed)
I advised patient of Padonda's response and she would like for you to call her back.  She would like to explain herself for the request.

## 2014-10-05 NOTE — Telephone Encounter (Signed)
Pt is calling back to let tamesha know her cable./phone went out .

## 2014-10-05 NOTE — Telephone Encounter (Signed)
Returned call to pt. Pt states that she does not have arthritis. She says every since she was a in school, she had a problem sitting in "places like that" and that the judge put her in jail overnight in past. She also said that she feels like Dr. Megan Salon should know she has bipolar because she has been seeing her for a long time. Pt has been under care of Padonda since 04/2014 and I advised her that bipolar disorder is not on her problem list neither has she ever seen Padonda for it. Advised, per [facility], pt will need to obtain a note from her psychiatrist. Pt hung up without saying anything else

## 2014-10-05 NOTE — Telephone Encounter (Signed)
Returned call to pt. Left message to advise pt again of Padonda's note. Advised pt to call back if she needs to

## 2014-10-05 NOTE — Telephone Encounter (Signed)
Pt states he doesn't have arthritis. Pt has a severe bipolar disorder and she cannot sit  up there all day in that room w/ others. Would like you to reconsider for this reason.

## 2014-10-05 NOTE — Telephone Encounter (Signed)
No medical rationale to excuse her from jury duty. She is getting back on her meds and should be stable for jury duty. Including arthritis.

## 2014-10-11 ENCOUNTER — Other Ambulatory Visit: Payer: Self-pay | Admitting: Family

## 2014-11-17 ENCOUNTER — Encounter: Payer: Self-pay | Admitting: Cardiology

## 2014-12-09 ENCOUNTER — Telehealth: Payer: Self-pay | Admitting: Family

## 2014-12-09 NOTE — Telephone Encounter (Signed)
Pt needs brand name amaryl 1 mg, avapro,crestor,norvasc,plavix and coreg. Pt can not take generic. [employer] medicare ppo [id number]. [facility] [address] Pt needs name brand all med. Pt med will run out of 12-28-14

## 2014-12-14 NOTE — Telephone Encounter (Signed)
Pt is aware must submit rxs before PA can be submitted

## 2014-12-15 NOTE — Telephone Encounter (Signed)
Avapro is managed by pt's cardiologist, Dr. Radford Pax

## 2014-12-15 NOTE — Telephone Encounter (Signed)
Pt called back with the name of the medicines that will take the place of Avapro    Cozaar  Diovan

## 2014-12-21 ENCOUNTER — Other Ambulatory Visit: Payer: Self-pay | Admitting: *Deleted

## 2014-12-21 MED ORDER — CARVEDILOL 25 MG PO TABS: 25.0000 mg | ORAL_TABLET | Freq: Two times a day (BID) | ORAL | Status: DC

## 2014-12-21 MED ORDER — IRBESARTAN 150 MG PO TABS: 150.0000 mg | ORAL_TABLET | Freq: Every morning | ORAL | Status: DC

## 2014-12-21 MED ORDER — FUROSEMIDE 40 MG PO TABS: 40.0000 mg | ORAL_TABLET | Freq: Every day | ORAL | Status: DC

## 2014-12-23 ENCOUNTER — Telehealth: Payer: Self-pay | Admitting: Family

## 2014-12-23 ENCOUNTER — Telehealth: Payer: Self-pay | Admitting: Cardiology

## 2014-12-23 MED ORDER — AMLODIPINE BESYLATE 5 MG PO TABS: 5.0000 mg | ORAL_TABLET | Freq: Every day | ORAL | Status: DC

## 2014-12-23 MED ORDER — PLAVIX 75 MG PO TABS: 75.0000 mg | ORAL_TABLET | Freq: Every day | ORAL | Status: DC

## 2014-12-23 MED ORDER — AMARYL 1 MG PO TABS: ORAL_TABLET | ORAL | Status: DC

## 2014-12-23 MED ORDER — ROSUVASTATIN CALCIUM 40 MG PO TABS: 40.0000 mg | ORAL_TABLET | Freq: Every day | ORAL | Status: DC

## 2014-12-23 NOTE — Addendum Note (Signed)
Addended by: Santiago Bumpers on: 12/23/2014 12:39 PM   Modules accepted: Orders

## 2014-12-23 NOTE — Telephone Encounter (Signed)
New problem    Pt want to know if auth form has been received for her medication refill. Please advise.

## 2014-12-23 NOTE — Telephone Encounter (Signed)
Patient needing authorization for these medications Coreg, Plavix, Norvas, Lasix, Avapro, Crestor, and  Zetia. Patient's insurance is [employer] [place], [id number]. Patient stated formed was faxed to our office. Will forward to [place].

## 2014-12-23 NOTE — Telephone Encounter (Signed)
Done

## 2014-12-23 NOTE — Telephone Encounter (Signed)
Left message to call back  

## 2014-12-23 NOTE — Telephone Encounter (Addendum)
Pt following up on refill request for meds below. Pt needs name brand. Pt has called cardiologist concerning avapro [facility] [address]

## 2014-12-23 NOTE — Telephone Encounter (Signed)
error 

## 2014-12-27 ENCOUNTER — Telehealth: Payer: Self-pay | Admitting: *Deleted

## 2014-12-27 NOTE — Telephone Encounter (Signed)
Called [employer] at [phone]. Representative informed me that PAs could not be completed yet because patient is not an active member until tomorrow. Informed her I will call back tomorrow.  Called patient and explained the situation to her. Patient grateful for follow-up.

## 2014-12-27 NOTE — Telephone Encounter (Signed)
Pt called stating [employer] needs pior autho for some medications. Please advise

## 2014-12-27 NOTE — Telephone Encounter (Signed)
F/U       Pt calling back states she is completely out of medications and needs them today.    Please call.

## 2014-12-28 ENCOUNTER — Telehealth: Payer: Self-pay | Admitting: Physician Assistant

## 2014-12-28 NOTE — Telephone Encounter (Signed)
Left message for patient that PAs have been submitted and we are awaiting approval.

## 2014-12-28 NOTE — Telephone Encounter (Signed)
New Message  Pt wanted to speak w/ Rn about prescriptions. Please call back and discuss.

## 2014-12-28 NOTE — Telephone Encounter (Signed)
The patient called about the refills on her meds.  I told her that we were working on getting PA from [place].  Waddell Iten, PAC

## 2014-12-29 NOTE — Telephone Encounter (Signed)
Informed patient PAs were completed yesterday and told her that there is a 72 hour waiting period to get them authorized.  Patient understands.

## 2014-12-29 NOTE — Telephone Encounter (Signed)
There's a note where cardiology is working on [employer] for "some medication". I am closing out this note.

## 2014-12-31 ENCOUNTER — Telehealth: Payer: Self-pay | Admitting: Cardiology

## 2014-12-31 ENCOUNTER — Ambulatory Visit: Payer: Medicare Other | Admitting: Family Medicine

## 2014-12-31 NOTE — Telephone Encounter (Signed)
Patient called, angry that PAs have not been authorized and [employer]  Informed patient that PAs were completed and sent March 1.  Called [employer]. They cancelled requests and could not give a reason why. Spoke to [place] ex [phone] and completed PAs again.  Instructed her to expedite the process as much as possible as the patient is out of medication.  She said the patient will most likely be called tomorrow.  Called and informed patient of what happened. She is thankful for follow-up.

## 2014-12-31 NOTE — Telephone Encounter (Signed)
New Msg        Pt calling states she has been out of medication is waiting on prior auth for meds.   Pt is on about 7 different medications and doesn't want to explain which ones needed. Requesting nurse call.   Please return call.

## 2015-01-03 ENCOUNTER — Telehealth: Payer: Self-pay | Admitting: Cardiology

## 2015-01-03 NOTE — Telephone Encounter (Signed)
New Msg        Pt calling, states there are several prescriptions that she needs to have prior authorization.   Pt is completely out of meds and states her meds are at pharmacy because Nurse hasn't filled paperwork out correctly.  Please return pt call. Pt only wants to speak with Dr. Radford Pax, refusing nurse at this time states she has tried that.

## 2015-01-03 NOTE — Telephone Encounter (Signed)
Patient aware of approved medications. Informed patient appeals will be made for Lasix and Avapro. Patient st she has tried and failed furosemide and HCTZ. Patient st she has tried and failed losartan, valsartan, and telmasartan.   Instructed patient to call [employer] if she has any problems getting her approved medications (Coreg, Plavix, Norvasc) or Crestor and Zetia ([employer] st PAs are not needed for these medications).

## 2015-01-03 NOTE — Telephone Encounter (Signed)
Called [employer] at [phone] and spoke with a rep about the patient's medications for PA. Advised the patient was told that they have not been approved. Per the [employer] rep:  Coreg (brand name) 25 mg BID- reviewed 3/4- approved 12/31/14-12/31/15 Plavix (brand name) 75 mg QD- reviewed 3/5- approved 12/31/14-12/31/15 Norvasc (brand name) 5 mg QD- reviewed 3/6- approved 12/31/14-12/31/15 Lasix (brand name) 40 mg QD- reviewed 3/4- denied- needs to try and fail 1) furosemide / 2) HCTZ Avapro (brand name) 150 mg QD- reviewed 3/4- denied- needs to try and fail 1) Losartan/ 2) Valsartan/ 3) Telmasartan  Lenice Llamas, RN aware and will speak with the patient.

## 2015-01-05 NOTE — Telephone Encounter (Signed)
Follow Up  Pt returned call to discuss medication authorization for Avapro. Pt understands your frustration and asks that you do not get disheartened in assisting her with this issue. Please call

## 2015-01-05 NOTE — Telephone Encounter (Signed)
Pt called to let Joellen Jersey know that she had tried and failed irbesartan also. Pt states she is able to take brand name Avapro only.   Pt advised I will forward to [facility] for review.

## 2015-01-06 NOTE — Telephone Encounter (Signed)
Spoke with [employer] and gave them the names of other medications the patient has tried and failed for Avapro: 1) Losartan 2) Valsartan 3) Telmasartan  Informed patient that an appeal has been placed and we will know within 72 hours if she has been approved.

## 2015-01-06 NOTE — Telephone Encounter (Signed)
F/U     Pt c/o medication issue:  1. Name of Medication: Avapro  2. How are you currently taking this medication (dosage and times per day)? 150 mg  3. Are you having a reaction (difficulty breathing--STAT)? no  4. What is your medication issue? Pt states insurance company needs information as to why pt is taking this medication.   Please return pt call.

## 2015-01-07 ENCOUNTER — Other Ambulatory Visit: Payer: Self-pay | Admitting: Family

## 2015-01-07 NOTE — Telephone Encounter (Signed)
Informed patient that PA for Avapro has been approved.  Patient grateful for call.

## 2015-01-07 NOTE — Telephone Encounter (Signed)
F/U      Appeal has been approved and a letters will be sent for Avapro.    Thaif calling from [employer], calls may be returned to [phone].

## 2015-01-10 ENCOUNTER — Other Ambulatory Visit: Payer: Self-pay | Admitting: Cardiology

## 2015-01-19 ENCOUNTER — Other Ambulatory Visit: Payer: Self-pay | Admitting: Cardiology

## 2015-02-01 NOTE — Progress Notes (Signed)
Cardiology Office Note   Date:  02/02/2015   ID:  Penny James, DOB 04-Jul-1951, MRN [id number]  PCP:  Donia Ast, FNP    Chief Complaint  Patient presents with  . Coronary Artery Disease  . Hyperlipidemia  . Hypertension      History of Present Illness: Penny James is a 64 y.o. female with a history of HTN, ASCAD, chronic systolic CHF, ischemic DCM and dyslipidemia who presents today for followup. She is doing well. She denies any chest pain, SOB, DOE, orthopnea, LE edema, dizziness, palpitations or syncope. She walks for exercise.    Past Medical History  Diagnosis Date  . Hyperlipidemia   . Hypertension   . Arthritis   . Anemia   . Ischemic cardiomyopathy     EF 55% by cath 2010  . Bipolar affective disorder   . GERD (gastroesophageal reflux disease)   . Obesity   . Peripheral neuropathy   . Depression   . Osteoarthritis   . 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 Bilateral carotid bruits     60-79% bilateral ICA steosis being followed by Dr Early not stent candidate, may need sx in future  . 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 PVD (peripheral vascular disease)     s/p PTA of left renal artery  . Coronary atherosclerosis     3 vessel s/p CABG - cath 2005 with widely patent grafts and distal disease past the grafts, cath 2010 with occluded SVG to IM and SVG to OM/PDA with PTCA of left circ and PCI of SVG to OM/PDA  . Carotid artery occlusion     60-79% bilateral ICA stenosis s/p Right CEA 2013  followed by Dr. Donnetta Hutching  . Chronic diastolic CHF (congestive heart failure)     Past Surgical History  Procedure Laterality Date  . Abdominal hysterectomy  1984  . Ptca  2003    with stent of left renal artery  . Tonsillectomy      AS A CHILD  . Endarterectomy  02/18/2012    Procedure: ENDARTERECTOMY CAROTID;  Surgeon: Rosetta Posner, MD;   Location: [facility] OR;  Service: Vascular;  Laterality: Right;  Right Carotid endarterectomy with Dacron patch angioplasty with resection of internal carotid artery  . Coronary artery bypass graft      3 vessel coronary disease S?P CABG with widely patent grafts by cath in 2005 and distal disease past the graft insertion sites.  . Cardiac catheterization  03/25/09    Occluded SVG to intermediate and SVG to OM/posterior descending artery with PTCA left circ 6/10 s/p PTCA stent in SVG to OM/PDA, 07/2002 s/p PCI of left circ 03/2009  . Carotid endarterectomy  02/18/12    Right CEA     Current Outpatient Prescriptions  Medication Sig Dispense Refill  . AMARYL 1 MG tablet TAKE 1 TABLET EVERY DAY WITH BREAKFAST 90 tablet 0  . amLODipine (NORVASC) 5 MG tablet Take 1 tablet (5 mg total) by mouth daily. 90 tablet 0  . aspirin 81 MG tablet Take 81 mg by mouth daily.    . AVAPRO 150 MG tablet TAKE 1 TABLET (150 MG TOTAL) BY MOUTH EVERY MORNING. 90 tablet 0  . BAYER BREEZE 2 TEST DISK   0  . BAYER BREEZE 2 TEST DISK TEST BLOOD SUGAR ONCE A DAY 100 each 11  . BAYER  MICROLET LANCETS lancets Use as instructed 100 each 12  . carvedilol (COREG) 25 MG tablet Take 1 tablet (25 mg total) by mouth 2 (two) times daily with a meal. 180 tablet 0  . ezetimibe (ZETIA) 10 MG tablet Take 1 tablet (10 mg total) by mouth daily. 90 tablet 1  . ferrous sulfate 325 (65 FE) MG tablet Take 325 mg by mouth daily with breakfast.    . furosemide (LASIX) 40 MG tablet Take 1 tablet (40 mg total) by mouth daily. 90 tablet 0  . Multiple Vitamins-Minerals (MULTIVITAL) tablet Take 1 tablet by mouth daily.     [name] olopatadine (PATANOL) 0.1 % ophthalmic solution Place 1 drop into both eyes 2 (two) times daily. 5 mL 1  . PLAVIX 75 MG tablet Take 1 tablet (75 mg total) by mouth daily. 90 tablet 0  . PREVACID 15 MG capsule take 1 capsule by mouth once daily 90 capsule 3  . rosuvastatin (CRESTOR) 40 MG tablet Take 1 tablet (40 mg total) by mouth  daily. 90 tablet 0   No current facility-administered medications for this visit.    Allergies:   Penicillins and Protamine    Social History:  The patient  reports that she has never smoked. She has never used smokeless tobacco. She reports that she does not drink alcohol or use illicit drugs.   Family History:  The patient's family history includes Cerebral aneurysm in her brother; Clotting disorder in her sister; Coronary artery disease in her paternal grandmother; Glaucoma in her father; Heart disease in her brother; Hypertension in her father; Stroke in her paternal grandmother.    ROS:  Please see the history of present illness.   Otherwise, review of systems are positive for none.   All other systems are reviewed and negative.    PHYSICAL EXAM: VS:  BP 130/66 mmHg  Pulse 62  Ht 5\' 3"  (1.6 m)  Wt 166 lb (75.297 kg)  BMI 29.41 kg/m2 , BMI Body mass index is 29.41 kg/(m^2). GEN: Well nourished, well developed, in no acute distress HEENT: normal Neck: no JVD, carotid bruits, or masses Cardiac: RRR; no murmurs, rubs, or gallops,no edema  Respiratory:  clear to auscultation bilaterally, normal work of breathing GI: soft, nontender, nondistended, + BS MS: no deformity or atrophy Skin: warm and dry, no rash Neuro:  Strength and sensation are intact Psych: euthymic mood, full affect   EKG:  EKG was ordered today and showed NSR with nonspecific ST abnormality.  No change from EKG 03/2014    Recent Labs: 10/04/2014: ALT 30; BUN 12; Creatinine 0.8; Hemoglobin 11.1*; Platelets 168.0; Potassium 4.0; Sodium 129*; TSH 1.78    Lipid Panel    Component Value Date/Time   CHOL 107 06/16/2014 1502   TRIG 77.0 06/16/2014 1502   HDL 35.50* 06/16/2014 1502   CHOLHDL 3 06/16/2014 1502   VLDL 15.4 06/16/2014 1502   LDLCALC 56 06/16/2014 1502      Wt Readings from Last 3 Encounters:  02/02/15 166 lb (75.297 kg)  10/04/14 163 lb (73.936 kg)  09/28/14 156 lb 12.8 oz (71.124 kg)       ASSESSMENT AND PLAN:  1. ASCAD with no angina - cotinue ASA/Plavix  2. HTN well controlled - continue Carvedilol/amlodipine/Avapro  3. Dyslipidemia - LDL at goal at 56 on 06/16/2014 - continue Crestor/Zetia  - Check FLP and ALT 4. Bilateral carotid artery stenosis s/p right CEA followed by Dr. Donnetta Hutching 5. Chronic diastolic CHF - appears euvolemic - continue Lasix  and BB - check BMET 6. Renal artery stenosis s/p PTA of left renal artery       7. Type II DM with vascular disease - per PCP  Current medicines are reviewed at length with the patient today.  The patient does not have concerns regarding medicines.  The following changes have been made:  no change  Labs/ tests ordered today include: see above assessment and plan  Orders Placed This Encounter  Procedures  . Basic Metabolic Panel (BMET)  . Hepatic function panel  . Lipid Profile  . EKG 12-Lead     Disposition:   FU with me in 6 months   Signed, Sueanne Margarita, MD  02/02/2015 9:51 AM    [facility] [address], [place], [place]  [address] Phone: [phone]; Fax: [phone]

## 2015-02-02 ENCOUNTER — Encounter: Payer: Self-pay | Admitting: Cardiology

## 2015-02-02 ENCOUNTER — Ambulatory Visit (INDEPENDENT_AMBULATORY_CARE_PROVIDER_SITE_OTHER): Payer: Medicare Other | Admitting: Cardiology

## 2015-02-02 ENCOUNTER — Telehealth: Payer: Self-pay | Admitting: Cardiology

## 2015-02-02 VITALS — BP 130/66 | HR 62 | Ht 63.0 in | Wt 166.0 lb

## 2015-02-02 DIAGNOSIS — I5032 Chronic diastolic (congestive) heart failure: Secondary | ICD-10-CM | POA: Diagnosis not present

## 2015-02-02 DIAGNOSIS — E782 Mixed hyperlipidemia: Secondary | ICD-10-CM | POA: Diagnosis not present

## 2015-02-02 DIAGNOSIS — I6529 Occlusion and stenosis of unspecified carotid artery: Secondary | ICD-10-CM | POA: Diagnosis not present

## 2015-02-02 DIAGNOSIS — I2510 Atherosclerotic heart disease of native coronary artery without angina pectoris: Secondary | ICD-10-CM

## 2015-02-02 DIAGNOSIS — I10 Essential (primary) hypertension: Secondary | ICD-10-CM | POA: Diagnosis not present

## 2015-02-02 LAB — HEPATIC FUNCTION PANEL
ALT: 32 U/L (ref 0–35)
AST: 27 U/L (ref 0–37)
Albumin: 4 g/dL (ref 3.5–5.2)
Alkaline Phosphatase: 60 U/L (ref 39–117)
BILIRUBIN TOTAL: 0.6 mg/dL (ref 0.2–1.2)
Bilirubin, Direct: 0.2 mg/dL (ref 0.0–0.3)
Total Protein: 6.6 g/dL (ref 6.0–8.3)

## 2015-02-02 LAB — LIPID PANEL
Cholesterol: 107 mg/dL (ref 0–200)
HDL: 37.5 mg/dL — AB (ref >39.00)
LDL CALC: 62 mg/dL (ref 0–99)
NONHDL: 69.5
Total CHOL/HDL Ratio: 3
Triglycerides: 38 mg/dL (ref 0.0–149.0)
VLDL: 7.6 mg/dL (ref 0.0–40.0)

## 2015-02-02 LAB — BASIC METABOLIC PANEL
BUN: 16 mg/dL (ref 6–23)
CO2: 27 mEq/L (ref 19–32)
CREATININE: 0.88 mg/dL (ref 0.40–1.20)
Calcium: 9.2 mg/dL (ref 8.4–10.5)
Chloride: 96 mEq/L (ref 96–112)
GFR: 82.63 mL/min (ref >60.00)
GLUCOSE: 133 mg/dL — AB (ref 70–99)
Potassium: 4.1 mEq/L (ref 3.5–5.1)
Sodium: 128 mEq/L — ABNORMAL LOW (ref 135–145)

## 2015-02-02 NOTE — Telephone Encounter (Signed)
New Message    Patient needs a call in regarding her medication and callling the pharmacy to get a refill of her diabetic medication.

## 2015-02-02 NOTE — Patient Instructions (Signed)
Your physician recommends that you continue on your current medications as directed. Please refer to the Current Medication list given to you today.  Your physician recommends that you have lab work today (BMET, LFT, Lipids)  Your physician wants you to follow-up in: 6 months with Dr. Radford Pax.  You will receive a reminder letter in the mail two months in advance. If you don't receive a letter, please call our office to schedule the follow-up appointment.

## 2015-02-02 NOTE — Telephone Encounter (Signed)
Calling requesting Rx for her diabetic medicine- Bayer Breeze Test be [facility].  States she use to use [facility] but they didn't have medications so [facility].  Advised that [facility] should have refills and should forward to [facility].  She will call them back.  Spoke w/Dr. Radford Pax who states she does not refill diabetic meds and for her to call urgent care. She verbalizes understanding.

## 2015-02-04 ENCOUNTER — Telehealth: Payer: Self-pay | Admitting: Cardiology

## 2015-02-04 DIAGNOSIS — I5032 Chronic diastolic (congestive) heart failure: Secondary | ICD-10-CM

## 2015-02-04 DIAGNOSIS — E871 Hypo-osmolality and hyponatremia: Secondary | ICD-10-CM

## 2015-02-04 DIAGNOSIS — E876 Hypokalemia: Secondary | ICD-10-CM

## 2015-02-04 MED ORDER — FUROSEMIDE 20 MG PO TABS: 20.0000 mg | ORAL_TABLET | Freq: Every day | ORAL | Status: DC

## 2015-02-04 NOTE — Telephone Encounter (Signed)
Patient notified of lab results and recommendation from Dr. Radford Pax to decrease Lasix from 40 mg daily to 20 mg daily and to repeat BMET on Monday, 4/11. Patient verbalized understanding and agreement to treatment plan.

## 2015-02-04 NOTE — Telephone Encounter (Signed)
Follow up:   Pt returned this office call please give her a call back.

## 2015-02-07 ENCOUNTER — Other Ambulatory Visit (INDEPENDENT_AMBULATORY_CARE_PROVIDER_SITE_OTHER): Payer: Medicare Other | Admitting: *Deleted

## 2015-02-07 DIAGNOSIS — E871 Hypo-osmolality and hyponatremia: Secondary | ICD-10-CM | POA: Diagnosis not present

## 2015-02-07 DIAGNOSIS — I5032 Chronic diastolic (congestive) heart failure: Secondary | ICD-10-CM | POA: Diagnosis not present

## 2015-02-07 LAB — BASIC METABOLIC PANEL
BUN: 16 mg/dL (ref 6–23)
CALCIUM: 9.5 mg/dL (ref 8.4–10.5)
CHLORIDE: 99 meq/L (ref 96–112)
CO2: 29 meq/L (ref 19–32)
CREATININE: 0.94 mg/dL (ref 0.40–1.20)
GFR: 76.57 mL/min (ref >60.00)
GLUCOSE: 104 mg/dL — AB (ref 70–99)
Potassium: 4.4 mEq/L (ref 3.5–5.1)
Sodium: 133 mEq/L — ABNORMAL LOW (ref 135–145)

## 2015-02-21 ENCOUNTER — Other Ambulatory Visit: Payer: Self-pay

## 2015-02-21 DIAGNOSIS — Z1231 Encounter for screening mammogram for malignant neoplasm of breast: Secondary | ICD-10-CM

## 2015-03-03 ENCOUNTER — Ambulatory Visit: Payer: Medicare Other

## 2015-03-24 ENCOUNTER — Other Ambulatory Visit: Payer: Self-pay | Admitting: Cardiology

## 2015-03-24 ENCOUNTER — Other Ambulatory Visit: Payer: Self-pay | Admitting: Family

## 2015-03-25 NOTE — Telephone Encounter (Signed)
Ok to refill 

## 2015-03-25 NOTE — Telephone Encounter (Signed)
Pt will est with Maudie Mercury on 10.3.16

## 2015-03-29 ENCOUNTER — Other Ambulatory Visit: Payer: Self-pay | Admitting: Cardiology

## 2015-03-29 ENCOUNTER — Telehealth: Payer: Self-pay

## 2015-03-29 DIAGNOSIS — I10 Essential (primary) hypertension: Secondary | ICD-10-CM

## 2015-03-29 DIAGNOSIS — R609 Edema, unspecified: Secondary | ICD-10-CM

## 2015-03-29 NOTE — Telephone Encounter (Signed)
Please have her come in for BMET and BNP.  Also make sure she is not salting her food.

## 2015-03-29 NOTE — Telephone Encounter (Signed)
Patient calls to report swelling in ankles and feet since last week. No Sob or weight gain. States this has happened since  decreasing Lasix from 40mg  to 20mg . Her Na was low.

## 2015-03-30 ENCOUNTER — Ambulatory Visit: Payer: Medicare Other

## 2015-03-30 NOTE — Telephone Encounter (Signed)
Patient informed. She wants to come in Friday for labs.

## 2015-03-30 NOTE — Telephone Encounter (Signed)
Attempted to call pt, phone rang several times and then a recording picked up. No voicemail available, unable to leave message.

## 2015-03-31 ENCOUNTER — Other Ambulatory Visit: Payer: Self-pay

## 2015-03-31 MED ORDER — PLAVIX 75 MG PO TABS: 75.0000 mg | ORAL_TABLET | Freq: Every day | ORAL | Status: DC

## 2015-04-01 ENCOUNTER — Other Ambulatory Visit (INDEPENDENT_AMBULATORY_CARE_PROVIDER_SITE_OTHER): Payer: Medicare Other | Admitting: *Deleted

## 2015-04-01 DIAGNOSIS — R609 Edema, unspecified: Secondary | ICD-10-CM | POA: Diagnosis not present

## 2015-04-01 DIAGNOSIS — I10 Essential (primary) hypertension: Secondary | ICD-10-CM | POA: Diagnosis not present

## 2015-04-01 LAB — BASIC METABOLIC PANEL
BUN: 18 mg/dL (ref 6–23)
CHLORIDE: 102 meq/L (ref 96–112)
CO2: 26 mEq/L (ref 19–32)
Calcium: 9.2 mg/dL (ref 8.4–10.5)
Creatinine, Ser: 0.93 mg/dL (ref 0.40–1.20)
GFR: 77.48 mL/min (ref >60.00)
Glucose, Bld: 91 mg/dL (ref 70–99)
POTASSIUM: 4 meq/L (ref 3.5–5.1)
Sodium: 134 mEq/L — ABNORMAL LOW (ref 135–145)

## 2015-04-01 LAB — BRAIN NATRIURETIC PEPTIDE: Pro B Natriuretic peptide (BNP): 460 pg/mL — ABNORMAL HIGH (ref 0.0–100.0)

## 2015-04-01 NOTE — Addendum Note (Signed)
Addended by: Eulis Foster on: 04/01/2015 07:38 AM   Modules accepted: Orders

## 2015-04-04 ENCOUNTER — Telehealth: Payer: Self-pay

## 2015-04-04 ENCOUNTER — Encounter: Payer: Self-pay | Admitting: Family

## 2015-04-04 DIAGNOSIS — I10 Essential (primary) hypertension: Secondary | ICD-10-CM

## 2015-04-04 MED ORDER — FUROSEMIDE 20 MG PO TABS: 20.0000 mg | ORAL_TABLET | ORAL | Status: DC

## 2015-04-04 NOTE — Telephone Encounter (Signed)
-----   Message from Sueanne Margarita, MD sent at 04/03/2015 10:28 PM EDT ----- BNP mildly elevated - increase Lasix to 40mg  daily for 3 days then 40mg  qod alternating with 20mg  qod - check BMET in 1 week

## 2015-04-04 NOTE — Telephone Encounter (Signed)
Informed patient of results and verbal understanding expressed.  Instructed patient to INCREASE LASIX to 40 mg for 3 days then alternate 40mg  and 20mg  Lasix daily. Repeat BMET scheduled for next Tuesday. Patient agrees with treatment plan.

## 2015-04-05 ENCOUNTER — Ambulatory Visit: Payer: No Typology Code available for payment source | Admitting: Family

## 2015-04-05 ENCOUNTER — Encounter (HOSPITAL_COMMUNITY): Payer: Medicare Other

## 2015-04-06 ENCOUNTER — Encounter: Payer: Self-pay | Admitting: Family

## 2015-04-08 ENCOUNTER — Ambulatory Visit (HOSPITAL_COMMUNITY)
Admission: RE | Admit: 2015-04-08 | Discharge: 2015-04-08 | Disposition: A | Discharge status: Home / Self Care | Payer: Medicare Other | Source: Ambulatory Visit | Attending: Family | Admitting: Family

## 2015-04-08 ENCOUNTER — Other Ambulatory Visit: Payer: Self-pay | Admitting: Vascular Surgery

## 2015-04-08 ENCOUNTER — Encounter: Payer: Self-pay | Admitting: Family

## 2015-04-08 ENCOUNTER — Ambulatory Visit (INDEPENDENT_AMBULATORY_CARE_PROVIDER_SITE_OTHER): Payer: Medicare Other | Admitting: Family

## 2015-04-08 VITALS — BP 158/70 | HR 65 | Resp 16 | Ht 63.0 in | Wt 168.0 lb

## 2015-04-08 DIAGNOSIS — Z9889 Other specified postprocedural states: Secondary | ICD-10-CM

## 2015-04-08 DIAGNOSIS — I6523 Occlusion and stenosis of bilateral carotid arteries: Secondary | ICD-10-CM | POA: Diagnosis not present

## 2015-04-08 DIAGNOSIS — I779 Disorder of arteries and arterioles, unspecified: Secondary | ICD-10-CM

## 2015-04-08 DIAGNOSIS — Z48812 Encounter for surgical aftercare following surgery on the circulatory system: Secondary | ICD-10-CM

## 2015-04-08 DIAGNOSIS — I739 Peripheral vascular disease, unspecified: Secondary | ICD-10-CM

## 2015-04-08 NOTE — Patient Instructions (Signed)
Stroke Prevention Some medical conditions and behaviors are associated with an increased chance of having a stroke. You may prevent a stroke by making healthy choices and managing medical conditions. HOW CAN I REDUCE MY RISK OF HAVING A STROKE?   Stay physically active. Get at least 30 minutes of activity on most or all days.  Do not smoke. It may also be helpful to avoid exposure to secondhand smoke.  Limit alcohol use. Moderate alcohol use is considered to be:  No more than 2 drinks per day for men.  No more than 1 drink per day for nonpregnant women.  Eat healthy foods. This involves:  Eating 5 or more servings of fruits and vegetables a day.  Making dietary changes that address high blood pressure (hypertension), high cholesterol, diabetes, or obesity.  Manage your cholesterol levels.  Making food choices that are high in fiber and low in saturated fat, trans fat, and cholesterol may control cholesterol levels.  Take any prescribed medicines to control cholesterol as directed by your health care provider.  Manage your diabetes.  Controlling your carbohydrate and sugar intake is recommended to manage diabetes.  Take any prescribed medicines to control diabetes as directed by your health care provider.  Control your hypertension.  Making food choices that are low in salt (sodium), saturated fat, trans fat, and cholesterol is recommended to manage hypertension.  Take any prescribed medicines to control hypertension as directed by your health care provider.  Maintain a healthy weight.  Reducing calorie intake and making food choices that are low in sodium, saturated fat, trans fat, and cholesterol are recommended to manage weight.  Stop drug abuse.  Avoid taking birth control pills.  Talk to your health care provider about the risks of taking birth control pills if you are over 35 years old, smoke, get migraines, or have ever had a blood clot.  Get evaluated for sleep  disorders (sleep apnea).  Talk to your health care provider about getting a sleep evaluation if you snore a lot or have excessive sleepiness.  Take medicines only as directed by your health care provider.  For some people, aspirin or blood thinners (anticoagulants) are helpful in reducing the risk of forming abnormal blood clots that can lead to stroke. If you have the irregular heart rhythm of atrial fibrillation, you should be on a blood thinner unless there is a good reason you cannot take them.  Understand all your medicine instructions.  Make sure that other conditions (such as anemia or atherosclerosis) are addressed. SEEK IMMEDIATE MEDICAL CARE IF:   You have sudden weakness or numbness of the face, arm, or leg, especially on one side of the body.  Your face or eyelid droops to one side.  You have sudden confusion.  You have trouble speaking (aphasia) or understanding.  You have sudden trouble seeing in one or both eyes.  You have sudden trouble walking.  You have dizziness.  You have a loss of balance or coordination.  You have a sudden, severe headache with no known cause.  You have new chest pain or an irregular heartbeat. Any of these symptoms may represent a serious problem that is an emergency. Do not wait to see if the symptoms will go away. Get medical help at once. Call your local emergency services (911 in [place] Do not drive yourself to the hospital. Document Released: 11/22/2004 Document Revised: 03/01/2014 Document Reviewed: 04/17/2013 Exit[employer] Patient Information 2015 [employer]. This information is not intended to replace advice given   to you by your health care provider. Make sure you discuss any questions you have with your health care provider.  

## 2015-04-08 NOTE — Progress Notes (Signed)
Established Carotid Patient   History of Present Illness  Penny James is a 64 y.o. female patient of Dr. Donnetta Hutching who presents today for follow up s/p right carotid endarterectomy on 02/18/2012.  The patient denies any history of TIA or stroke symptoms, specifically the patient denies a history of amaurosis fugax or monocular blindness, denies a history unilateral of facial drooping, denies a history of hemiplegia, and denies a history of receptive or expressive aphasia.  The patient denies claudication symptoms with walking, denies non healing wounds.   The patient denies New Medical or Surgical History.  Pt denies any kidney problems.  Pt Diabetic: Yes, states she states she takes Amaryl prn for FBS >100. Pt smoker: non-smoker  Pt meds include: Statin : Yes ASA: Yes Other anticoagulants/antiplatelets: Plavix   09/28/14 visit with Dr. Donnetta Hutching:  I did review her carotid CT angiogram of her neck today. This shows proximal 45% stenosis of her distal endarterectomy site with no critical stenosis. She's had no change in her left carotid anatomy since her prior CT angiogram 2013 with a predicted 70% stenosis. She has a known left vertebral artery occlusion. I discussed this at length with Penny James explain this does not put her any significant increased risk for stroke and therefore would continue duplex follow-up. I did explain that the degree of calcification makes ultrasound somewhat difficult to predict her level of stenosis but we now have a baseline ultrasound and CT angiogram for comparison. She'll notify us should she develop any neurologic deficits. Otherwise we will see her again in 6 months with carotid duplex    Past Medical History  Diagnosis Date  . Hyperlipidemia   . Hypertension   . Arthritis   . Anemia   . Ischemic cardiomyopathy     EF 55% by cath 2010  . Bipolar affective disorder   . GERD (gastroesophageal reflux disease)   . Obesity   . Peripheral  neuropathy   . Depression   . Osteoarthritis   . 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 Bilateral carotid bruits     60-79% bilateral ICA steosis being followed by Dr Early not stent candidate, may need sx in future  . 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 PVD (peripheral vascular disease)     s/p PTA of left renal artery  . Coronary atherosclerosis     3 vessel s/p CABG - cath 2005 with widely patent grafts and distal disease past the grafts, cath 2010 with occluded SVG to IM and SVG to OM/PDA with PTCA of left circ and PCI of SVG to OM/PDA  . Carotid artery occlusion     60-79% bilateral ICA stenosis s/p Right CEA 2013  followed by Dr. Donnetta Hutching  . Chronic diastolic CHF (congestive heart failure)     Social History History  Substance Use Topics  . Smoking status: Never Smoker   . Smokeless tobacco: Never Used  . Alcohol Use: No    Family History Family History  Problem Relation Age of Onset  . Heart disease Brother     Heart Disease before age 60  . Glaucoma Father   . Hypertension Father   . Stroke Paternal Grandmother   . Coronary artery disease Paternal Grandmother   . Cerebral aneurysm Brother   . Clotting disorder Sister     Surgical History Past Surgical History  Procedure Laterality Date  .  Abdominal hysterectomy  1984  . Ptca  2003    with stent of left renal artery  . Tonsillectomy      AS A CHILD  . Endarterectomy  02/18/2012    Procedure: ENDARTERECTOMY CAROTID;  Surgeon: Rosetta Posner, MD;  Location: [facility] OR;  Service: Vascular;  Laterality: Right;  Right Carotid endarterectomy with Dacron patch angioplasty with resection of internal carotid artery  . Coronary artery bypass graft      3 vessel coronary disease S?P CABG with widely patent grafts by cath in 2005 and distal disease past the graft insertion sites.  . Cardiac catheterization  03/25/09     Occluded SVG to intermediate and SVG to OM/posterior descending artery with PTCA left circ 6/10 s/p PTCA stent in SVG to OM/PDA, 07/2002 s/p PCI of left circ 03/2009  . Carotid endarterectomy  02/18/12    Right CEA    Allergies  Allergen Reactions  . Penicillins Rash  . Protamine     Unknown reaction    Current Outpatient Prescriptions  Medication Sig Dispense Refill  . AMARYL 1 MG tablet TAKE 1 TABLET EVERY DAY WITH BREAKFAST 90 tablet 0  . aspirin 81 MG tablet Take 81 mg by mouth daily.    . AVAPRO 150 MG tablet TAKE 1 TABLET (150 MG TOTAL) BY MOUTH EVERY MORNING. 90 tablet 0  . BAYER BREEZE 2 TEST DISK TEST BLOOD SUGAR ONCE A DAY 100 each 11  . BAYER MICROLET LANCETS lancets Use as instructed 100 each 12  . COREG 25 MG tablet TAKE 1 TABLET TWICE A DAY WITH A MEAL 180 tablet 1  . CRESTOR 40 MG tablet TAKE 1 TABLET (40 MG TOTAL) BY MOUTH DAILY. 90 tablet 0  . ezetimibe (ZETIA) 10 MG tablet Take 1 tablet (10 mg total) by mouth daily. 90 tablet 1  . ferrous sulfate 325 (65 FE) MG tablet Take 325 mg by mouth daily with breakfast.    . furosemide (LASIX) 20 MG tablet Take 1 tablet (20 mg total) by mouth every other day. Take 2 tablets (40 mg total) by mouth every other alternating day. 45 tablet 6  . LASIX 40 MG tablet daily.  0  . Multiple Vitamins-Minerals (MULTIVITAL) tablet Take 1 tablet by mouth daily.     . NORVASC 5 MG tablet TAKE 1 TABLET (5 MG TOTAL) BY MOUTH DAILY. 90 tablet 0  . olopatadine (PATANOL) 0.1 % ophthalmic solution Place 1 drop into both eyes 2 (two) times daily. 5 mL 1  . PLAVIX 75 MG tablet Take 1 tablet (75 mg total) by mouth daily. 90 tablet 1  . PREVACID 15 MG capsule take 1 capsule by mouth once daily 90 capsule 3  . BAYER BREEZE 2 TEST DISK   0   No current facility-administered medications for this visit.    Review of Systems : See HPI for pertinent positives and negatives.  Physical Examination  Filed Vitals:   04/08/15 0952 04/08/15 1003  BP: 130/70  158/70  Pulse: 65 65  Resp:  16  Height:  5\' 3"  (1.6 m)  Weight:  168 lb (76.204 kg)  SpO2:  100%   Body mass index is 29.77 kg/(m^2).  General: WDWN female in NAD GAIT: normal Eyes: PERRLA Pulmonary: Non-labored, CTAB, Negative Rales, Negative rhonchi, & Negative wheezing.  Cardiac: regular Rhythm,no detected murmur.  VASCULAR EXAM Carotid Bruits Right Left   Positive Positive   Aorta is not palpable. Radial pulses are 2+ palpable and equal.  LE Pulses Right Left   POPLITEAL not palpable  not palpable   POSTERIOR TIBIAL not palpable  not palpable    DORSALIS PEDIS  ANTERIOR TIBIAL not palpable  not palpable     Gastrointestinal: soft, nontender, BS WNL, no r/g, no palpable masses.  Musculoskeletal: Negative muscle atrophy/wasting. M/S 5/5 throughout, Extremities without ischemic changes.  Neurologic: A&O X 3; Appropriate Affect ; SENSATION ;normal;  Speech is normal CN 2-12 intact, Pain and light touch intact in extremities, Motor exam as listed above.         Non-Invasive Vascular Imaging CAROTID DUPLEX 04/08/2015   CEREBROVASCULAR DUPLEX EVALUATION    INDICATION: Follow-up carotid disease     PREVIOUS INTERVENTION(S): Right carotid endarterectomy 02/18/2012    DUPLEX EXAM:     RIGHT  LEFT  Peak Systolic Velocities (cm/s) End Diastolic Velocities (cm/s) Plaque LOCATION Peak Systolic Velocities (cm/s) End Diastolic Velocities (cm/s) Plaque  83 18  CCA PROXIMAL 82 25   89 21  CCA MID 164 50   70 16  CCA DISTAL 135 41   132 16 HT ECA 228 26 HT/CP  204 87 CP ICA PROXIMAL 148 46 CP  148 59 HT/CP ICA MID 289 112 CP  114 52  ICA DISTAL 139 45     NA ICA / CCA Ratio (PSV) 2.1  Reconstituted  Vertebral Flow Antegrade   935 Brachial Systolic Pressure (mmHg) 701  Within normal  limits  Brachial Artery Waveforms Within normal limits     Plaque Morphology:  HM = Homogeneous, HT = Heterogeneous, CP = Calcific Plaque, SP = Smooth Plaque, IP = Irregular Plaque  ADDITIONAL FINDINGS: See impression on following page.      IMPRESSION: 1. Patent right carotid endarterectomy with evidence of significant (50%-69%) restenosis of the distal patch. Plaque is calcific and difficult to fully evaluate. 2. Evidence of critical (>70%) stenosis of the left internal carotid artery; however, plaque is densely calcific with mild tortuosity; unable to confirm accurate Doppler angle which may misrepresent velocities. 3. Evidence of >50% stenosis of the left mid common carotid artery. 4. Bilateral external carotid artery stenosis is present. 5. Right vertebral artery appears occluded with distal reconstitution. 6. Left vertebral artery is antegrade.    Compared to the previous exam:  Technically difficult study due to vessel depth, tortuosity, and calcification. Comparison not appropriate.     Assessment: Penny James is a 64 y.o. female who is  s/p right carotid endarterectomy on 02/18/2012. She has no history of stroke or TIA. Today's carotid Duplex suggests a patent right carotid endarterectomy with evidence of significant (50%-69%) restenosis of the distal patch. Plaque is calcific and difficult to fully evaluate. Evidence of critical (>70%) stenosis of the left internal carotid artery; however, plaque is densely calcific with mild tortuosity; unable to confirm accurate Doppler angle which may misrepresent velocities. Evidence of >50% stenosis of the left mid common carotid artery. Technically difficult study due to vessel depth, tortuosity, and calcification. Comparison not appropriate.  09/28/14 CTA of neck showed 45% stenosis of her distal endarterectomy site with no critical stenosis. She's had no change in her left carotid anatomy since her prior CT angiogram 2013 with a  predicted 70% stenosis..    Plan: Follow-up in 6 months with Carotid Duplex.   I discussed in depth with the patient the nature of atherosclerosis, and emphasized the importance of maximal medical management including strict control of blood pressure, blood glucose, and lipid levels, obtaining regular exercise, and continued  cessation of smoking.  The patient is aware that without maximal medical management the underlying atherosclerotic disease process will progress, limiting the benefit of any interventions. The patient was given information about stroke prevention and what symptoms should prompt the patient to seek immediate medical care. Thank you for allowing [place] to participate in this patient's care.  Clemon Chambers, RN, MSN, FNP-C [facility] Office: [phone]  Clinic Physician: Early on call  04/08/2015 10:15 AM

## 2015-04-12 ENCOUNTER — Other Ambulatory Visit (INDEPENDENT_AMBULATORY_CARE_PROVIDER_SITE_OTHER): Payer: Medicare Other | Admitting: *Deleted

## 2015-04-12 DIAGNOSIS — I10 Essential (primary) hypertension: Secondary | ICD-10-CM

## 2015-04-12 LAB — BASIC METABOLIC PANEL
BUN: 16 mg/dL (ref 6–23)
CHLORIDE: 103 meq/L (ref 96–112)
CO2: 26 mEq/L (ref 19–32)
Calcium: 9.4 mg/dL (ref 8.4–10.5)
Creatinine, Ser: 0.98 mg/dL (ref 0.40–1.20)
GFR: 72.93 mL/min (ref >60.00)
Glucose, Bld: 108 mg/dL — ABNORMAL HIGH (ref 70–99)
Potassium: 4.3 mEq/L (ref 3.5–5.1)
Sodium: 136 mEq/L (ref 135–145)

## 2015-04-12 NOTE — Addendum Note (Signed)
Addended by: Eulis Foster on: 04/12/2015 08:17 AM   Modules accepted: Orders

## 2015-04-14 ENCOUNTER — Telehealth: Payer: Self-pay | Admitting: Cardiology

## 2015-04-14 NOTE — Telephone Encounter (Signed)
-----   Message from Sueanne Margarita, MD sent at 04/12/2015 11:52 AM EDT ----- Please let patient know that labs were normal.  Continue current medical therapy.

## 2015-04-14 NOTE — Telephone Encounter (Signed)
Informed patient of results and verbal understanding expressed.  

## 2015-04-14 NOTE — Telephone Encounter (Signed)
New message ° ° ° ° °Returning Penny James's call °

## 2015-04-18 ENCOUNTER — Other Ambulatory Visit: Payer: Self-pay | Admitting: Cardiology

## 2015-04-19 ENCOUNTER — Telehealth: Payer: Self-pay | Admitting: Family Medicine

## 2015-04-19 NOTE — Telephone Encounter (Signed)
tonya PA is on your desk

## 2015-04-19 NOTE — Telephone Encounter (Signed)
Pt needs PA prevacid 15 mg. [facility] [address]

## 2015-04-20 NOTE — Telephone Encounter (Signed)
PA has been submitted.

## 2015-04-20 NOTE — Telephone Encounter (Signed)
[employer] calling to see if they can get info over the phone, or how long will it be before they get fax back

## 2015-04-22 NOTE — Telephone Encounter (Signed)
Per Yvone Neu from [place] PA was denied.

## 2015-04-22 NOTE — Telephone Encounter (Signed)
Penny James is working on an appeal and will callback with any updated information.

## 2015-04-25 ENCOUNTER — Other Ambulatory Visit: Payer: Self-pay | Admitting: Family Medicine

## 2015-04-25 MED ORDER — PREVACID 15 MG PO CPDR: 15.0000 mg | DELAYED_RELEASE_CAPSULE | Freq: Every day | ORAL | Status: DC

## 2015-04-25 NOTE — Telephone Encounter (Signed)
rx sent in electronically 

## 2015-04-25 NOTE — Telephone Encounter (Signed)
Pt needs new rx prevacid 15 mg #90 with [facility] [address]

## 2015-04-25 NOTE — Telephone Encounter (Signed)
Patient states medication was approved.

## 2015-06-01 ENCOUNTER — Encounter: Payer: Self-pay | Admitting: Cardiology

## 2015-06-26 ENCOUNTER — Other Ambulatory Visit: Payer: Self-pay | Admitting: Family Medicine

## 2015-06-27 NOTE — Telephone Encounter (Signed)
Reviewed recent labs and BS ok. Pt schedule for new pt visit. Ok to do 90 days refills to appt only.

## 2015-06-30 ENCOUNTER — Telehealth: Payer: Self-pay

## 2015-06-30 NOTE — Telephone Encounter (Signed)
Rx sent in on 8/29, awaiting paper request for auth.

## 2015-06-30 NOTE — Telephone Encounter (Signed)
The pt called and is hoping to get a refill of her amaryl sent to the [facility] pharmacy

## 2015-06-30 NOTE — Telephone Encounter (Signed)
[employer] /medicare called to fu on prior for pt's AMARYL 1 MG tablet I was told to contact you for thi. pls advise  [employer] phone:  [phone]

## 2015-07-01 NOTE — Telephone Encounter (Signed)
[employer] has called 2 x and requested to take care of on the phone. Advised I would forward to the correct person We did receive non formulary request. It is in the folder

## 2015-07-06 ENCOUNTER — Other Ambulatory Visit: Payer: Self-pay | Admitting: Cardiology

## 2015-07-15 NOTE — Telephone Encounter (Signed)
Pt's pcp is [facility], routed to wrong provider, sorry.  [facility], please see note below regarding denial of pt's amaryl 1mg .

## 2015-07-15 NOTE — Telephone Encounter (Signed)
Per [employer] PA denied for amaryl 1mg .  Formulary alternatives that pt's plan will cover are glimepiride or glipizide.  Please advise.

## 2015-07-15 NOTE — Telephone Encounter (Signed)
Have I ever seen this patient?

## 2015-07-19 ENCOUNTER — Telehealth: Payer: Self-pay | Admitting: Cardiology

## 2015-07-19 DIAGNOSIS — I10 Essential (primary) hypertension: Secondary | ICD-10-CM

## 2015-07-19 MED ORDER — FUROSEMIDE 20 MG PO TABS: 20.0000 mg | ORAL_TABLET | ORAL | Status: DC

## 2015-07-19 NOTE — Telephone Encounter (Signed)
Patient c/o intermittent SOB (on exertion at at rest) for roughly 10 days and BLE swelling for three days. When asked about medications, patient has not been taking her Lasix as directed. She has been alternating Lasix 10 mg and 20 mg instead of Lasix 20 mg and 40 mg. Instructed patient to increase Lasix and alternate 20 mg with 40 mg qod. BMET scheduled for next Tuesday. Instructed patient to call back if symptoms do not improve. Patient agrees with treatment plan and is grateful for call back.

## 2015-07-19 NOTE — Telephone Encounter (Signed)
Pt c/o Shortness Of Breath: STAT if SOB developed within the last 24 hours or pt is noticeably SOB on the phone  1. Are you currently SOB (can you hear that pt is SOB on the phone)? yes  2. How long have you been experiencing SOB? 10  days  3. Are you SOB when sitting or when up moving around? both  4. Are you currently experiencing any other symptoms? Leg swelling in her legs and ankles

## 2015-07-21 ENCOUNTER — Other Ambulatory Visit: Payer: Self-pay | Admitting: Family Medicine

## 2015-07-21 MED ORDER — AMARYL 1 MG PO TABS: ORAL_TABLET | ORAL | Status: DC

## 2015-07-21 NOTE — Addendum Note (Signed)
Addended by: Dutch Quint B on: 07/21/2015 08:19 AM   Modules accepted: Orders

## 2015-07-26 ENCOUNTER — Other Ambulatory Visit: Payer: Medicare Other

## 2015-07-27 ENCOUNTER — Other Ambulatory Visit (INDEPENDENT_AMBULATORY_CARE_PROVIDER_SITE_OTHER): Payer: Medicare Other | Admitting: *Deleted

## 2015-07-27 ENCOUNTER — Telehealth: Payer: Self-pay

## 2015-07-27 DIAGNOSIS — I10 Essential (primary) hypertension: Secondary | ICD-10-CM

## 2015-07-27 LAB — BASIC METABOLIC PANEL
BUN: 30 mg/dL — AB (ref 6–23)
CALCIUM: 9.5 mg/dL (ref 8.4–10.5)
CO2: 30 mEq/L (ref 19–32)
Chloride: 93 mEq/L — ABNORMAL LOW (ref 96–112)
Creatinine, Ser: 1.4 mg/dL — ABNORMAL HIGH (ref 0.40–1.20)
GFR: 48.28 mL/min — AB (ref >60.00)
Glucose, Bld: 99 mg/dL (ref 70–99)
Potassium: 4.5 mEq/L (ref 3.5–5.1)
Sodium: 128 mEq/L — ABNORMAL LOW (ref 135–145)

## 2015-07-27 MED ORDER — FUROSEMIDE 20 MG PO TABS: 20.0000 mg | ORAL_TABLET | ORAL | Status: DC

## 2015-07-27 NOTE — Telephone Encounter (Signed)
Per Melina Copa, patient to decrease lasix back to 10 mg qod alternating 20 mg qod. BMET scheduled for next Tuesday.  Patient agrees with treatment plan.

## 2015-07-29 NOTE — Telephone Encounter (Signed)
Called [facility] to auth 1 month supply of generic Amary 1mg , ok per Dr Maudie Mercury. [facility] advised that patient requests all name brand drugs as generics do not work for her. I will start a prior auth for her.

## 2015-08-01 ENCOUNTER — Ambulatory Visit (INDEPENDENT_AMBULATORY_CARE_PROVIDER_SITE_OTHER): Payer: Medicare Other | Admitting: Family Medicine

## 2015-08-01 ENCOUNTER — Other Ambulatory Visit (INDEPENDENT_AMBULATORY_CARE_PROVIDER_SITE_OTHER): Payer: Medicare Other | Admitting: *Deleted

## 2015-08-01 DIAGNOSIS — I10 Essential (primary) hypertension: Secondary | ICD-10-CM

## 2015-08-01 DIAGNOSIS — R69 Illness, unspecified: Secondary | ICD-10-CM

## 2015-08-01 LAB — BASIC METABOLIC PANEL
BUN: 38 mg/dL — AB (ref 6–23)
CALCIUM: 9.2 mg/dL (ref 8.4–10.5)
CHLORIDE: 95 meq/L — AB (ref 96–112)
CO2: 26 meq/L (ref 19–32)
CREATININE: 1.75 mg/dL — AB (ref 0.40–1.20)
GFR: 37.32 mL/min — ABNORMAL LOW (ref >60.00)
Glucose, Bld: 117 mg/dL — ABNORMAL HIGH (ref 70–99)
Potassium: 4.3 mEq/L (ref 3.5–5.1)
Sodium: 128 mEq/L — ABNORMAL LOW (ref 135–145)

## 2015-08-01 NOTE — Progress Notes (Signed)
NO SHOW FOR NPV

## 2015-08-02 ENCOUNTER — Telehealth: Payer: Self-pay

## 2015-08-02 ENCOUNTER — Other Ambulatory Visit: Payer: Medicare Other

## 2015-08-02 DIAGNOSIS — I10 Essential (primary) hypertension: Secondary | ICD-10-CM

## 2015-08-02 DIAGNOSIS — R7989 Other specified abnormal findings of blood chemistry: Secondary | ICD-10-CM

## 2015-08-02 MED ORDER — NORVASC 10 MG PO TABS: 10.0000 mg | ORAL_TABLET | Freq: Every day | ORAL | Status: DC

## 2015-08-02 NOTE — Telephone Encounter (Signed)
Notes Recorded by Sueanne Margarita, MD on 08/02/2015 at 3:17 PM Please order renal duplex to rule out recurrent renal artery stenosis Notes Recorded by Sueanne Margarita, MD on 08/02/2015 at 3:16 PM Stop Lasix. Stop Avapro and increase amlodipine to 10mg  daily. Recheck BMET on Friday 10/7    Instructed patient to STOP AVAPRO and LASIX and to INCREASE NORVASC to 10 mg daily. Renal duplex ordered for scheduling.  Patient to come in Monday for repeat BMET as she is unable Friday.

## 2015-08-03 ENCOUNTER — Encounter: Payer: Self-pay | Admitting: Family Medicine

## 2015-08-03 ENCOUNTER — Ambulatory Visit (INDEPENDENT_AMBULATORY_CARE_PROVIDER_SITE_OTHER): Payer: Medicare Other | Admitting: Family Medicine

## 2015-08-03 VITALS — BP 124/62 | HR 69 | Temp 97.8°F | Ht 63.0 in | Wt 172.1 lb

## 2015-08-03 DIAGNOSIS — N289 Disorder of kidney and ureter, unspecified: Secondary | ICD-10-CM

## 2015-08-03 DIAGNOSIS — Z23 Encounter for immunization: Secondary | ICD-10-CM

## 2015-08-03 DIAGNOSIS — D649 Anemia, unspecified: Secondary | ICD-10-CM

## 2015-08-03 DIAGNOSIS — K219 Gastro-esophageal reflux disease without esophagitis: Secondary | ICD-10-CM

## 2015-08-03 DIAGNOSIS — E782 Mixed hyperlipidemia: Secondary | ICD-10-CM | POA: Diagnosis not present

## 2015-08-03 DIAGNOSIS — I2510 Atherosclerotic heart disease of native coronary artery without angina pectoris: Secondary | ICD-10-CM | POA: Diagnosis not present

## 2015-08-03 DIAGNOSIS — N189 Chronic kidney disease, unspecified: Secondary | ICD-10-CM

## 2015-08-03 DIAGNOSIS — I5032 Chronic diastolic (congestive) heart failure: Secondary | ICD-10-CM

## 2015-08-03 DIAGNOSIS — I10 Essential (primary) hypertension: Secondary | ICD-10-CM | POA: Diagnosis not present

## 2015-08-03 DIAGNOSIS — E1151 Type 2 diabetes mellitus with diabetic peripheral angiopathy without gangrene: Secondary | ICD-10-CM | POA: Diagnosis not present

## 2015-08-03 DIAGNOSIS — I739 Peripheral vascular disease, unspecified: Secondary | ICD-10-CM

## 2015-08-03 DIAGNOSIS — E669 Obesity, unspecified: Secondary | ICD-10-CM

## 2015-08-03 LAB — CBC WITH DIFFERENTIAL/PLATELET
BASOS ABS: 0 10*3/uL (ref 0.0–0.1)
BASOS PCT: 0.4 % (ref 0.0–3.0)
Eosinophils Absolute: 0.2 10*3/uL (ref 0.0–0.7)
Eosinophils Relative: 4 % (ref 0.0–5.0)
HEMATOCRIT: 29.7 % — AB (ref 36.0–46.0)
HEMOGLOBIN: 10 g/dL — AB (ref 12.0–15.0)
LYMPHS PCT: 31.8 % (ref 12.0–46.0)
Lymphs Abs: 1.2 10*3/uL (ref 0.7–4.0)
MCHC: 33.8 g/dL (ref 30.0–36.0)
MCV: 85.3 fl (ref 78.0–100.0)
MONOS PCT: 9.1 % (ref 3.0–12.0)
Monocytes Absolute: 0.4 10*3/uL (ref 0.1–1.0)
NEUTROS ABS: 2.1 10*3/uL (ref 1.4–7.7)
Neutrophils Relative %: 54.7 % (ref 43.0–77.0)
PLATELETS: 163 10*3/uL (ref 150.0–400.0)
RBC: 3.48 Mil/uL — ABNORMAL LOW (ref 3.87–5.11)
RDW: 14.3 % (ref 11.5–15.5)
WBC: 3.9 10*3/uL — AB (ref 4.0–10.5)

## 2015-08-03 LAB — HEMOGLOBIN A1C: Hgb A1c MFr Bld: 6.2 % (ref 4.6–6.5)

## 2015-08-03 NOTE — Progress Notes (Signed)
Pre visit review using our clinic review tool, if applicable. No additional management support is needed unless otherwise documented below in the visit note. 

## 2015-08-03 NOTE — Patient Instructions (Signed)
BEFORE YOU LEAVE: -flu shot -labs -physical exam in january or February  Please see your eye doctor as planned next week and have your diabetic eye exam report sent to our office.  Please follow up with your cardiologist as planned regarding the blood pressure and kidneys and let me know the name of your kidney doctor.  Please provide [place] with a copy of your vaccine record and your last colonoscopy report  Continue the healthy diet and regular exercise  We have ordered labs or studies at this visit. It can take up to 1-2 weeks for results and processing. We will contact you with instructions IF your results are abnormal. Normal results will be released to your [facility]. If you have not heard from [place] or can not find your results in [facility] in 2 weeks please contact our office.  We recommend the following healthy lifestyle measures: - eat a healthy whole foods diet consisting of regular small meals composed of vegetables, fruits, beans, nuts, seeds, healthy meats such as white chicken and fish and whole grains.  - avoid sweets, white starchy foods, fried foods, fast food, processed foods, sodas, red meet and other fattening foods.  - get a least 150-300 minutes of aerobic exercise per week.

## 2015-08-03 NOTE — Telephone Encounter (Signed)
PA approved on 10/3.

## 2015-08-03 NOTE — Progress Notes (Signed)
HPI:  Penny James is here to establish care. She has a very complicated PMH, yet on review of chart it appears she did not see her prior PCP very often for care. She did not show up for her new pt visit with me two days ago and we worked her in today.  Has the following chronic problems that require follow up and concerns today:  DM w/ PVD and CKD: -meds: no medications currently, report on amrayl, glipizide and insulin in the past, but then changed her diet and has been off all medications for 1 year -reports home blood sugars 80-90 fasting and in low 100s postprandial -eye exam: reports scheduled for next week -foot exam: done -denies: polyuria, polydipsia, vision changes, wounds, hypglycemia  Acute renal insufficiency: -worsening renal function recently after increased lasix dose -reports getting scan for renal artery stenosis and rechecking labs with her cardiologist -reports avapro and lasix currently held -reports is scheduled to a see a nephrologist per her cardiologist, but she does not know the name of the doctor  HLD/Obesity: -meds: zetia 10 mg, crestor 40mg  -eating a very healthy diet per her report - whole oats for breakfast, salad with grilled chicken for lung and veggies and lean meats for dinner -report regular aerobic exercise - zumba 3x per week  CAD s/p CABG, HTN, Carotid art dz, CsCHF, PVD: -managed by her cardiologist and vascular surgeon -meds: asa, plavix, coreg, statin, norvasc - avapro and lasix recently held by her cardiologist due to worsening renal function and renal dupex ordered per review of cardiology notes -denies:CP, SOB, DOE -reports doing zumba 3x per week and feels fine  GERD: Meds: prevacid 15mg  rarely for bad acid -denies: frequent heartburn, dysphagia, nausea  Iron def anemia: -chronic -meds: ferrous sulfate with prior PCP  ROS negative for unless reported above: fevers, unintentional weight loss, hearing or vision loss, chest  pain, palpitations, struggling to breath, hemoptysis, melena, hematochezia, hematuria, falls, loc, si, thoughts of self harm  Past Medical History  Diagnosis Date  . Hyperlipidemia   . Hypertension   . Arthritis   . Anemia   . Ischemic cardiomyopathy     EF 55% by cath 2010  . Bipolar affective disorder ([place])   . GERD (gastroesophageal reflux disease)   . Obesity   . Peripheral neuropathy ([place])   . Depression   . Osteoarthritis   . 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 Bilateral carotid bruits     60-79% bilateral ICA steosis being followed by Dr Early not stent candidate, may need sx in future  . 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 PVD (peripheral vascular disease) ([facility])     s/p PTA of left renal artery  . Coronary atherosclerosis     3 vessel s/p CABG - cath 2005 with widely patent grafts and distal disease past the grafts, cath 2010 with occluded SVG to IM and SVG to OM/PDA with PTCA of left circ and PCI of SVG to OM/PDA  . Carotid artery occlusion     60-79% bilateral ICA stenosis s/p Right CEA 2013  followed by Dr. Donnetta Hutching  . Chronic diastolic CHF (congestive heart failure) [facility])     Past Surgical History  Procedure Laterality Date  . Abdominal hysterectomy  1984  . Ptca  2003    with stent of left renal artery  . Tonsillectomy  AS A CHILD  . Endarterectomy  02/18/2012    Procedure: ENDARTERECTOMY CAROTID;  Surgeon: Rosetta Posner, MD;  Location: [facility] OR;  Service: Vascular;  Laterality: Right;  Right Carotid endarterectomy with Dacron patch angioplasty with resection of internal carotid artery  . Coronary artery bypass graft      3 vessel coronary disease S?P CABG with widely patent grafts by cath in 2005 and distal disease past the graft insertion sites.  . Cardiac catheterization  03/25/09    Occluded SVG to intermediate and SVG to OM/posterior descending artery  with PTCA left circ 6/10 s/p PTCA stent in SVG to OM/PDA, 07/2002 s/p PCI of left circ 03/2009  . Carotid endarterectomy  02/18/12    Right CEA    Family History  Problem Relation Age of Onset  . Heart disease Brother     Heart Disease before age 36  . Glaucoma Father   . Hypertension Father   . Stroke Paternal Grandmother   . Coronary artery disease Paternal Grandmother   . Cerebral aneurysm Brother   . Clotting disorder Sister     Social History   Social History  . Marital Status: Married    Spouse Name: N/A  . Number of Children: 3  . Years of Education: N/A   Occupational History  . DISABLED    Social History Main Topics  . Smoking status: Never Smoker   . Smokeless tobacco: Never Used  . Alcohol Use: No  . Drug Use: No  . Sexual Activity: No   Other Topics Concern  . None   Social History Narrative     Current outpatient prescriptions:  .  aspirin 81 MG tablet, Take 81 mg by mouth daily., Disp: , Rfl:  .  BAYER BREEZE 2 TEST DISK, TEST BLOOD SUGAR ONCE A DAY, Disp: 100 each, Rfl: 11 .  BAYER MICROLET LANCETS lancets, Use as instructed, Disp: 100 each, Rfl: 12 .  COREG 25 MG tablet, TAKE 1 TABLET TWICE A DAY WITH A MEAL, Disp: 180 tablet, Rfl: 1 .  CRESTOR 40 MG tablet, TAKE 1 TABLET BY MOUTH EVERY DAY, Disp: 30 tablet, Rfl: 0 .  ferrous sulfate 325 (65 FE) MG tablet, Take 325 mg by mouth daily with breakfast., Disp: , Rfl:  .  Multiple Vitamins-Minerals (MULTIVITAL) tablet, Take 1 tablet by mouth daily. , Disp: , Rfl:  .  NORVASC 10 MG tablet, Take 1 tablet (10 mg total) by mouth daily., Disp: 90 tablet, Rfl: 2 .  olopatadine (PATANOL) 0.1 % ophthalmic solution, Place 1 drop into both eyes 2 (two) times daily., Disp: 5 mL, Rfl: 1 .  PLAVIX 75 MG tablet, Take 1 tablet (75 mg total) by mouth daily., Disp: 90 tablet, Rfl: 1 .  PREVACID 15 MG capsule, TAKE ONE CAPSULE EVERY DAY, Disp: 90 capsule, Rfl: 0 .  ZETIA 10 MG tablet, TAKE 1 TABLET BY MOUTH EVERY DAY,  Disp: 90 tablet, Rfl: 0  EXAM:  Filed Vitals:   08/03/15 1024  BP: 124/62  Pulse: 69  Temp: 97.8 F (36.6 C)    Body mass index is 30.49 kg/(m^2).  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 ([place]) - Plan: Hemoglobin A1c -lifestyle recs -advised to provide [place] with copy of eye exam -congratulated on healthy lifestyel and great blood sugars  Atherosclerosis of native coronary artery of native heart without angina pectoris -managed by her cardiologist, no symptoms today  Mixed hyperlipidemia -managed by her cardiologist, last lipids reviewed  Benign essential HTN -BP ok today, avapro currently held for renal art duplex  Obesity -lifestyle recs, congratulated on progress  PVD (peripheral vascular disease) ([place]) -managed by cards and vasc surg  Chronic diastolic CHF (congestive heart failure) ([place]) -managed by her cardiologist, asymptomatic today  Anemia, unspecified anemia type - Plan: CBC with Differential  Gastroesophageal reflux disease, esophagitis presence not specified -stable  Acute on chronic renal insufficiency ([place]) -she reports her cardiolgist is seeing her cardiologist for this and is to see a nephrologist but she doe snot know the name, advise she notify us of the nephrologist she will be seeing so that we can obtain their records, continue to hold medications  and recheck labs and renal duplex as planned  -We reviewed the PMH, PSH, FH, SH, Meds and Allergies. -We provided refills for any medications we will prescribe as needed. -We addressed current concerns per orders and patient instructions. -We have asked for records for pertinent exams, studies, vaccines and notes from previous providers. -We have advised patient to follow up per instructions below.   -Patient advised to return or notify a doctor immediately if symptoms worsen or persist or new concerns arise.  Patient Instructions  BEFORE YOU LEAVE: -flu shot -labs -physical exam in january or February  Please see your eye doctor as planned next week and have your diabetic eye exam report sent to our office.  Please follow up with your cardiologist as planned regarding the blood pressure and kidneys and let me know the name of your kidney doctor.  Please provide [place] with a copy of your vaccine record and your last colonoscopy report  Continue the healthy diet and regular exercise  We have ordered labs or studies at this visit. It can take up to 1-2 weeks for results and processing. We will contact you with instructions IF your results are abnormal. Normal results will be released to your [facility]. If you have not heard from [place] or can not find your results in [facility] in 2 weeks please contact our office.  We recommend the following healthy lifestyle measures: - eat a healthy whole foods diet consisting of regular small meals composed of vegetables, fruits, beans, nuts, seeds, healthy meats such as white chicken and fish and whole grains.  - avoid sweets, white starchy foods, fried foods, fast food, processed foods, sodas, red meet and other fattening foods.  - get a least 150-300 minutes of aerobic exercise per week.            Colin Benton R.

## 2015-08-05 ENCOUNTER — Telehealth: Payer: Self-pay | Admitting: Family Medicine

## 2015-08-05 NOTE — Addendum Note (Signed)
Addended by: Agnes Lawrence on: 08/05/2015 04:14 PM   Modules accepted: Orders

## 2015-08-05 NOTE — Telephone Encounter (Signed)
Pt would like a call back about her ac1 results

## 2015-08-08 ENCOUNTER — Other Ambulatory Visit (INDEPENDENT_AMBULATORY_CARE_PROVIDER_SITE_OTHER): Payer: Medicare Other | Admitting: *Deleted

## 2015-08-08 DIAGNOSIS — I10 Essential (primary) hypertension: Secondary | ICD-10-CM | POA: Diagnosis not present

## 2015-08-08 DIAGNOSIS — R7989 Other specified abnormal findings of blood chemistry: Secondary | ICD-10-CM

## 2015-08-08 LAB — BASIC METABOLIC PANEL
BUN: 22 mg/dL (ref 7–25)
CALCIUM: 9.4 mg/dL (ref 8.6–10.4)
CO2: 27 mmol/L (ref 20–31)
Chloride: 99 mmol/L (ref 98–110)
Creat: 1.16 mg/dL — ABNORMAL HIGH (ref 0.50–0.99)
Glucose, Bld: 162 mg/dL — ABNORMAL HIGH (ref 65–99)
Potassium: 4.1 mmol/L (ref 3.5–5.3)
SODIUM: 132 mmol/L — AB (ref 135–146)

## 2015-08-08 NOTE — Addendum Note (Signed)
Addended by: Eulis Foster on: 08/08/2015 09:00 AM   Modules accepted: Orders

## 2015-08-08 NOTE — Addendum Note (Signed)
Addended by: Eulis Foster on: 08/08/2015 08:59 AM   Modules accepted: Orders

## 2015-08-09 ENCOUNTER — Telehealth: Payer: Self-pay | Admitting: Hematology

## 2015-08-09 NOTE — Telephone Encounter (Signed)
New patient appt-s/w patient and gave np appt for 11/01 @ 4;78 w/Dr. Irene Limbo. Referring Dr. Colin Benton Dx- Anemia

## 2015-08-12 ENCOUNTER — Telehealth: Payer: Self-pay | Admitting: Cardiology

## 2015-08-12 DIAGNOSIS — I5032 Chronic diastolic (congestive) heart failure: Secondary | ICD-10-CM

## 2015-08-12 NOTE — Telephone Encounter (Signed)
New message     Pt c/o medication issue:  1. Name of Medication: norvasc 10 mg   2. How are you currently taking this medication (dosage and times per day)? Once a day   3. Are you having a reaction (difficulty breathing--STAT)? Both legs are swelling   4. What is your medication issue? Calling to discuss with nurse.

## 2015-08-12 NOTE — Telephone Encounter (Signed)
Patient st since increasing Norvasc to 10 mg daily, her feet and ankles have started to swell. She st she does not like the higher dose and dose not want to take it. Informed the patient that her swelling is more likely due to stopping her lasix when she increased her Norvasc. Patient insists the swelling "isn't that bad" and she is not having any other symptoms. Patient agrees to take 5 mg Norvasc daily over the weekend in case it is the higher dose causing her swelling. Patient has no VS to report.  Informed patient that Dr. Radford Pax is not here today but that she will reevaluate her medications and the patient will be called early next week.  Patient agrees with treatment plan and understands to call back if her starts to experience SOB or CP.  To Dr. Radford Pax.

## 2015-08-14 NOTE — Telephone Encounter (Signed)
How long has she been on higher dose of amlodipine and what dose of Lasix is she taking

## 2015-08-15 ENCOUNTER — Other Ambulatory Visit (INDEPENDENT_AMBULATORY_CARE_PROVIDER_SITE_OTHER): Payer: Medicare Other | Admitting: *Deleted

## 2015-08-15 DIAGNOSIS — I5032 Chronic diastolic (congestive) heart failure: Secondary | ICD-10-CM | POA: Diagnosis not present

## 2015-08-15 LAB — BASIC METABOLIC PANEL
BUN: 24 mg/dL (ref 7–25)
CALCIUM: 9.4 mg/dL (ref 8.6–10.4)
CHLORIDE: 99 mmol/L (ref 98–110)
CO2: 26 mmol/L (ref 20–31)
CREATININE: 1.19 mg/dL — AB (ref 0.50–0.99)
GLUCOSE: 143 mg/dL — AB (ref 65–99)
Potassium: 3.7 mmol/L (ref 3.5–5.3)
Sodium: 132 mmol/L — ABNORMAL LOW (ref 135–146)

## 2015-08-15 MED ORDER — NORVASC 10 MG PO TABS: 5.0000 mg | ORAL_TABLET | Freq: Every day | ORAL | Status: DC

## 2015-08-15 MED ORDER — FUROSEMIDE 20 MG PO TABS: 20.0000 mg | ORAL_TABLET | Freq: Every day | ORAL | Status: DC

## 2015-08-15 NOTE — Addendum Note (Signed)
Addended by: Eulis Foster on: 08/15/2015 10:37 AM   Modules accepted: Orders

## 2015-08-15 NOTE — Telephone Encounter (Signed)
Dr. Radford Pax, who is in the office today, advised patient restart Lasix at 40 mg x 2 days and then 20 mg a day, decrease Amlodipine to 5 mg once daily and get BMET today.   I reviewed Dr. Theodosia Blender advice with patient who verbalized understanding and agreement.  She states she has Lasix 20 mg and she understands to take 2 pills today and tomorrow and then take 1 pill daily.  She states she will call her son to bring her in for bmet.  I advised her to call back if symptoms do not improve.  She verbalized understanding and agreement.

## 2015-08-15 NOTE — Addendum Note (Signed)
Addended by: Emmaline Life on: 08/15/2015 09:26 AM   Modules accepted: Orders

## 2015-08-15 NOTE — Telephone Encounter (Signed)
New Message  Pt c/o swelling: STAT is pt has developed SOB within 24 hours  1. How long have you been experiencing swelling? For 1 week  2. Where is the swelling located? Ankles, legs and feet.  3.  Are you currently taking a "fluid pill"? Yes   4.  Are you currently SOB? Yes! Whenever she walks from one room to another she has to stop and rest to get to wherever she is going.   5.  Have you traveled recently? No   Pt c/o Shortness Of Breath: STAT if SOB developed within the last 24 hours or pt is noticeably SOB on the phone  1. Are you currently SOB (can you hear that pt is SOB on the phone)? No. Because she is sitting dow  2. How long have you been experiencing SOB? For about 3 days   3. Are you SOB when sitting or when up moving around? Moving around   4. Are you currently experiencing any other symptoms? Swelling   Comments: Pt called states that she was taking Avapro and lasix. She reports she was placed on the 20 mg of lasix. She is now swelling in her Ankles legs and feet. Pt states that she would like to know which brand of medication the lasix is and if there is anything that can be done about her swelling and SOB.

## 2015-08-15 NOTE — Telephone Encounter (Signed)
Spoke with patient who states she has been experiencing SOB x 3 days; denies chest discomfort.  States bilateral foot and leg swelling is worse since talking to Lenice Llamas, RN last week.  She reports she is not taking any Lasix since 10/4 as advised and that she continues to take Amlodipine 10 mg and other medications as directed (Carvedilol, Zetia, Plavix, Crestor).  She denies high sodium diet, states she only cooks with Ms. Dash and other non salt substitutes.  She states she elevated her legs through the weekend and did not some improvement. I advised her that I will discuss with Dr. Radford Pax and call her back with her advice.  She verbalized understanding and agreement.

## 2015-08-16 ENCOUNTER — Inpatient Hospital Stay (HOSPITAL_COMMUNITY): Admission: RE | Admit: 2015-08-16 | Payer: Medicare Other | Source: Ambulatory Visit

## 2015-08-17 ENCOUNTER — Telehealth: Payer: Self-pay | Admitting: Family Medicine

## 2015-08-17 DIAGNOSIS — E1151 Type 2 diabetes mellitus with diabetic peripheral angiopathy without gangrene: Secondary | ICD-10-CM

## 2015-08-17 NOTE — Telephone Encounter (Signed)
Ok to order - however there is no indication for 4 times per day testing and so insurance may not pay for that frequent of testing when not on insulin. Also, we can order brand name medications but they may cost her more.

## 2015-08-17 NOTE — Telephone Encounter (Signed)
Needs the Breeze 2 meter and lancets. She prefers to check her sugar 4 times a day and needs enough strips called in to cover her for that. Patient also wants you to know that she only takes name brand medications. [facility] on [address] is who she uses.

## 2015-08-18 ENCOUNTER — Other Ambulatory Visit: Payer: Self-pay | Admitting: *Deleted

## 2015-08-18 ENCOUNTER — Encounter (HOSPITAL_COMMUNITY): Payer: Medicare Other

## 2015-08-18 ENCOUNTER — Ambulatory Visit: Payer: Medicare Other | Admitting: Cardiology

## 2015-08-18 MED ORDER — GLUCOSE BLOOD VI DISK: DISK | Status: DC

## 2015-08-18 MED ORDER — BAYER MICROLET LANCETS MISC: Status: DC

## 2015-08-18 NOTE — Telephone Encounter (Signed)
I called the Penny James and informed her of the message below and she stated her insurance will pay for the strips.  She stated she only needs refills on the strips and lancets as I informed her Dr Radford Pax sent a refill on the Lasix for her on 10/17 and she is aware the diabetic supplies were sent to [facility] on [address].

## 2015-08-19 ENCOUNTER — Other Ambulatory Visit: Payer: Self-pay | Admitting: Family Medicine

## 2015-08-19 ENCOUNTER — Telehealth: Payer: Self-pay | Admitting: Family Medicine

## 2015-08-19 ENCOUNTER — Other Ambulatory Visit: Payer: Self-pay

## 2015-08-19 ENCOUNTER — Telehealth: Payer: Self-pay | Admitting: Cardiology

## 2015-08-19 MED ORDER — GLUCOSE BLOOD VI DISK: DISK | Status: AC

## 2015-08-19 MED ORDER — BAYER MICROLET LANCETS MISC: Status: AC

## 2015-08-19 NOTE — Telephone Encounter (Signed)
Called and spoke to patient. Refills were filled today 08/19/15 on lancets and strips to reflect checking sugars 4 x a day

## 2015-08-19 NOTE — Telephone Encounter (Signed)
Patient st she is no longer on diabetic medications but wants to continue checking her blood sugar. Instructed patient to consult her PCP since Dr. Radford Pax primarily orders cardiac medications. Patient agrees with treatment plan and is grateful for callback.

## 2015-08-19 NOTE — Telephone Encounter (Signed)
Pt states she needs breeze 2 meter test strips also another box of lancets. Pt is requesting the directions to be change from testing once a day to 4 times a day. [facility] [address] . Pt did received one box on lancet already. Pt is aware michelle clinical team leader will call her back by end of day.

## 2015-08-19 NOTE — Telephone Encounter (Signed)
Rx refilled and sent to [facility] [address]

## 2015-08-19 NOTE — Telephone Encounter (Signed)
New message     Calling to see if Dr Radford Pax would call in her diabetic breeze strips?  She want to talk to the nurse first to explain why she want Dr Radford Pax to do it.  Please call

## 2015-08-19 NOTE — Telephone Encounter (Signed)
Pt req refill  BAYER MICROLET LANCETS lancets ,  Glucose Blood (BAYER BREEZE 2 TEST) DISK   Pt said she is no longer on diabetic medicine so she check her sugar 6 times a day   Pt said she does not do any generic medicines     Pharmacy [facility] [address]

## 2015-08-19 NOTE — Telephone Encounter (Signed)
Patient notified that Rx was sent to pharmacy.  

## 2015-08-22 ENCOUNTER — Telehealth: Payer: Self-pay | Admitting: Family Medicine

## 2015-08-22 NOTE — Telephone Encounter (Signed)
I called the pt and she stated she wanted to know what her numbers were.  I informed her the hemoglobin A1C is now 6.2 and was 6.6 and Dr Maudie Mercury stated that was good and she agreed.

## 2015-08-22 NOTE — Telephone Encounter (Signed)
Patient called in again wanting the results of her A1C.

## 2015-08-22 NOTE — Telephone Encounter (Signed)
I called [facility] and spoke with Juliann Pulse the pharmacist and she stated the pt picked up the Rx on 10/21.  I called the pt and she stated she already has the Rx as this message was sent last week.

## 2015-08-22 NOTE — Telephone Encounter (Signed)
Left a message for the pt to return my call.  

## 2015-08-22 NOTE — Telephone Encounter (Signed)
[facility] Night - Client TELEPHONE [facility] Patient Name: Penny James Gender: Female DOB: 1951-08-03 Age: 64 Y 9 M 4 D Return Phone Number: [phone] (Primary) Address: City/State/Zip: [place] [address] Client [facility] Night - Client Client Site [facility] - Night Physician Colin Benton Contact Type Call Call Type Triage / Clinical Relationship To Patient Self Return Phone Number [phone] (Primary) Chief Complaint Prescription Refill or Medication Request (non symptomatic) Initial Comment Caller states needs to get rx to check sugar. Has been trying for 2 weeks. Needs to get test strips. Nurse Assessment Nurse: Einar Gip, RN, Neoma Laming Date/Time (Eastern Time): 08/19/2015 8:23:20 PM Confirm and document reason for call. If symptomatic, describe symptoms. ---Caller states she is out of test strips. [facility] test strips. [phone]. Advised we can authorize a one bottle refill is verified by pharmacist and then she will need to contact the office on Monday for the full refill. Caller verbalized understanding. Has the patient traveled out of the country within the last 30 days? ---Not Applicable Does the patient have any new or worsening symptoms? ---No Guidelines Guideline Title Affirmed Question Affirmed Notes Nurse Date/Time (Eastern Time) Disp. Time Eilene Ghazi Time) Disposition Final User 08/19/2015 8:29:13 PM Pharmacy Call Einar Gip, RN, Neoma Laming Reason: Pharmacist states they have received refill order for the test strips. 08/19/2015 8:29:28 PM Clinical Call Yes Einar Gip, RN, Neoma Laming After Care Instructions Given Call Event Type User Date / Time Description

## 2015-08-25 ENCOUNTER — Telehealth: Payer: Self-pay | Admitting: Family Medicine

## 2015-08-25 MED ORDER — OLOPATADINE HCL 0.1 % OP SOLN: 1.0000 [drp] | Freq: Two times a day (BID) | OPHTHALMIC | Status: DC

## 2015-08-25 NOTE — Telephone Encounter (Signed)
Pt needs new rx patanol [facility] to new pharm [facility] [address]

## 2015-08-27 ENCOUNTER — Other Ambulatory Visit: Payer: Self-pay | Admitting: Family

## 2015-08-29 NOTE — Telephone Encounter (Signed)
Re-routing

## 2015-08-30 ENCOUNTER — Other Ambulatory Visit: Payer: Medicare Other

## 2015-08-30 ENCOUNTER — Ambulatory Visit: Payer: Medicare Other | Admitting: Hematology

## 2015-09-07 ENCOUNTER — Telehealth: Payer: Self-pay | Admitting: Family Medicine

## 2015-09-07 NOTE — Telephone Encounter (Signed)
Patient Name: Penny James DOB: 05-25-1951 Initial Comment caller states she has cough, congestion and trouble breathing Nurse Assessment Nurse: Vallery Sa, RN, [place] Date/Time (Eastern Time): 09/07/2015 2:11:25 PM Confirm and document reason for call. If symptomatic, describe symptoms. ---Caller states that she has had a cough and cold since September 2016. She developed shortness of breath that is worse today. No fever. No chest pain. Has the patient traveled out of the country within the last 30 days? ---No Does the patient have any new or worsening symptoms? ---Yes Will a triage be completed? ---Yes Related visit to physician within the last 2 weeks? ---No Does the PT have any chronic conditions? (i.e. diabetes, asthma, etc.) ---Yes List chronic conditions. ---Heart problems, CHF, Diabetes, High Blood Pressure Guidelines Guideline Title Affirmed Question Affirmed Notes Breathing Difficulty [1] MODERATE difficulty breathing (e.g., speaks in phrases, SOB even at rest, pulse 100-120) AND [2] NEW-onset or WORSE than normal Final Disposition User Go to ED Now Vallery Sa, RN, [place] Referrals [facility] - ED Disagree/Comply: Comply

## 2015-09-08 NOTE — Telephone Encounter (Signed)
Per Dr Maudie Mercury I called the pt to see how she was doing today and offered an appt to be seen today.  She states she is feeling a lot better and denies any shortness of breath and does not feel she needs to come in.

## 2015-09-09 ENCOUNTER — Emergency Department (HOSPITAL_COMMUNITY): Payer: Medicare Other

## 2015-09-09 ENCOUNTER — Encounter (HOSPITAL_COMMUNITY): Payer: Self-pay | Admitting: Emergency Medicine

## 2015-09-09 ENCOUNTER — Inpatient Hospital Stay (HOSPITAL_COMMUNITY)
Admission: EM | Admit: 2015-09-09 | Discharge: 2015-09-10 | DRG: 292 | Disposition: A | Discharge status: Home / Self Care | Payer: Medicare Other | Attending: Internal Medicine | Admitting: Internal Medicine

## 2015-09-09 DIAGNOSIS — Z90710 Acquired absence of both cervix and uterus: Secondary | ICD-10-CM | POA: Diagnosis not present

## 2015-09-09 DIAGNOSIS — Z880 Allergy status to penicillin: Secondary | ICD-10-CM | POA: Diagnosis not present

## 2015-09-09 DIAGNOSIS — N189 Chronic kidney disease, unspecified: Secondary | ICD-10-CM | POA: Diagnosis not present

## 2015-09-09 DIAGNOSIS — I248 Other forms of acute ischemic heart disease: Secondary | ICD-10-CM | POA: Diagnosis present

## 2015-09-09 DIAGNOSIS — G629 Polyneuropathy, unspecified: Secondary | ICD-10-CM | POA: Diagnosis present

## 2015-09-09 DIAGNOSIS — I700 Atherosclerosis of aorta: Secondary | ICD-10-CM | POA: Diagnosis present

## 2015-09-09 DIAGNOSIS — F319 Bipolar disorder, unspecified: Secondary | ICD-10-CM | POA: Diagnosis present

## 2015-09-09 DIAGNOSIS — Z79899 Other long term (current) drug therapy: Secondary | ICD-10-CM | POA: Diagnosis not present

## 2015-09-09 DIAGNOSIS — Z6830 Body mass index (BMI) 30.0-30.9, adult: Secondary | ICD-10-CM

## 2015-09-09 DIAGNOSIS — Z951 Presence of aortocoronary bypass graft: Secondary | ICD-10-CM

## 2015-09-09 DIAGNOSIS — M1990 Unspecified osteoarthritis, unspecified site: Secondary | ICD-10-CM | POA: Diagnosis present

## 2015-09-09 DIAGNOSIS — E669 Obesity, unspecified: Secondary | ICD-10-CM | POA: Diagnosis present

## 2015-09-09 DIAGNOSIS — K219 Gastro-esophageal reflux disease without esophagitis: Secondary | ICD-10-CM | POA: Diagnosis present

## 2015-09-09 DIAGNOSIS — I110 Hypertensive heart disease with heart failure: Secondary | ICD-10-CM | POA: Diagnosis present

## 2015-09-09 DIAGNOSIS — I10 Essential (primary) hypertension: Secondary | ICD-10-CM | POA: Diagnosis not present

## 2015-09-09 DIAGNOSIS — R0600 Dyspnea, unspecified: Secondary | ICD-10-CM | POA: Diagnosis not present

## 2015-09-09 DIAGNOSIS — Z7982 Long term (current) use of aspirin: Secondary | ICD-10-CM

## 2015-09-09 DIAGNOSIS — Z823 Family history of stroke: Secondary | ICD-10-CM

## 2015-09-09 DIAGNOSIS — I5033 Acute on chronic diastolic (congestive) heart failure: Secondary | ICD-10-CM | POA: Diagnosis not present

## 2015-09-09 DIAGNOSIS — N289 Disorder of kidney and ureter, unspecified: Secondary | ICD-10-CM | POA: Diagnosis not present

## 2015-09-09 DIAGNOSIS — I2510 Atherosclerotic heart disease of native coronary artery without angina pectoris: Secondary | ICD-10-CM | POA: Diagnosis present

## 2015-09-09 DIAGNOSIS — E785 Hyperlipidemia, unspecified: Secondary | ICD-10-CM | POA: Diagnosis present

## 2015-09-09 DIAGNOSIS — Z8249 Family history of ischemic heart disease and other diseases of the circulatory system: Secondary | ICD-10-CM

## 2015-09-09 DIAGNOSIS — E1122 Type 2 diabetes mellitus with diabetic chronic kidney disease: Secondary | ICD-10-CM | POA: Diagnosis present

## 2015-09-09 DIAGNOSIS — Z7902 Long term (current) use of antithrombotics/antiplatelets: Secondary | ICD-10-CM | POA: Diagnosis not present

## 2015-09-09 DIAGNOSIS — I255 Ischemic cardiomyopathy: Secondary | ICD-10-CM | POA: Diagnosis present

## 2015-09-09 DIAGNOSIS — D61818 Other pancytopenia: Secondary | ICD-10-CM | POA: Diagnosis present

## 2015-09-09 DIAGNOSIS — E1151 Type 2 diabetes mellitus with diabetic peripheral angiopathy without gangrene: Secondary | ICD-10-CM | POA: Diagnosis present

## 2015-09-09 DIAGNOSIS — Z955 Presence of coronary angioplasty implant and graft: Secondary | ICD-10-CM

## 2015-09-09 DIAGNOSIS — R7989 Other specified abnormal findings of blood chemistry: Secondary | ICD-10-CM

## 2015-09-09 DIAGNOSIS — Z888 Allergy status to other drugs, medicaments and biological substances status: Secondary | ICD-10-CM

## 2015-09-09 LAB — CBC WITH DIFFERENTIAL/PLATELET
BASOS PCT: 1 %
Basophils Absolute: 0 10*3/uL (ref 0.0–0.1)
EOS ABS: 0.1 10*3/uL (ref 0.0–0.7)
EOS PCT: 3 %
HCT: 28.2 % — ABNORMAL LOW (ref 36.0–46.0)
HEMOGLOBIN: 9.6 g/dL — AB (ref 12.0–15.0)
LYMPHS ABS: 1 10*3/uL (ref 0.7–4.0)
Lymphocytes Relative: 36 %
MCH: 29.1 pg (ref 26.0–34.0)
MCHC: 34 g/dL (ref 30.0–36.0)
MCV: 85.5 fL (ref 78.0–100.0)
MONO ABS: 0.5 10*3/uL (ref 0.1–1.0)
MONOS PCT: 18 %
Neutro Abs: 1.2 10*3/uL — ABNORMAL LOW (ref 1.7–7.7)
Neutrophils Relative %: 42 %
PLATELETS: 139 10*3/uL — AB (ref 150–400)
RBC: 3.3 MIL/uL — ABNORMAL LOW (ref 3.87–5.11)
RDW: 14.1 % (ref 11.5–15.5)
WBC: 2.8 10*3/uL — ABNORMAL LOW (ref 4.0–10.5)

## 2015-09-09 LAB — I-STAT TROPONIN, ED
TROPONIN I, POC: 0.17 ng/mL — AB (ref 0.00–0.08)
TROPONIN I, POC: 0.16 ng/mL — AB (ref 0.00–0.08)

## 2015-09-09 LAB — TROPONIN I: Troponin I: 0.18 ng/mL — ABNORMAL HIGH

## 2015-09-09 LAB — BASIC METABOLIC PANEL
Anion gap: 6 (ref 5–15)
BUN: 19 mg/dL (ref 6–20)
CALCIUM: 8.9 mg/dL (ref 8.9–10.3)
CHLORIDE: 101 mmol/L (ref 101–111)
CO2: 26 mmol/L (ref 22–32)
CREATININE: 1.05 mg/dL — AB (ref 0.44–1.00)
GFR calc Af Amer: >60 mL/min (ref >60)
GFR calc non Af Amer: 54 mL/min — ABNORMAL LOW (ref >60)
Glucose, Bld: 103 mg/dL — ABNORMAL HIGH (ref 65–99)
Potassium: 3.7 mmol/L (ref 3.5–5.1)
SODIUM: 133 mmol/L — AB (ref 135–145)

## 2015-09-09 LAB — GLUCOSE, CAPILLARY: Glucose-Capillary: 128 mg/dL — ABNORMAL HIGH (ref 65–99)

## 2015-09-09 LAB — BRAIN NATRIURETIC PEPTIDE: B Natriuretic Peptide: 1193.4 pg/mL — ABNORMAL HIGH (ref 0.0–100.0)

## 2015-09-09 LAB — TSH: TSH: 2.595 u[IU]/mL (ref 0.350–4.500)

## 2015-09-09 MED ORDER — EZETIMIBE 10 MG PO TABS
10.0000 mg | ORAL_TABLET | Freq: Every day | ORAL | Status: DC
  Administered 2015-09-09 – 2015-09-10 (×2): 10 mg via ORAL
  Filled 2015-09-09 (×2): qty 1

## 2015-09-09 MED ORDER — ONDANSETRON HCL 4 MG PO TABS: 4.0000 mg | ORAL_TABLET | Freq: Four times a day (QID) | ORAL | Status: DC | PRN

## 2015-09-09 MED ORDER — AMLODIPINE BESYLATE 5 MG PO TABS
5.0000 mg | ORAL_TABLET | Freq: Every day | ORAL | Status: DC
  Administered 2015-09-10: 5 mg via ORAL

## 2015-09-09 MED ORDER — ENOXAPARIN SODIUM 40 MG/0.4ML ~~LOC~~ SOLN
40.0000 mg | SUBCUTANEOUS | Status: DC
  Filled 2015-09-09 (×2): qty 0.4

## 2015-09-09 MED ORDER — PANTOPRAZOLE SODIUM 20 MG PO TBEC
20.0000 mg | DELAYED_RELEASE_TABLET | Freq: Every day | ORAL | Status: DC
  Filled 2015-09-09: qty 1

## 2015-09-09 MED ORDER — CLOPIDOGREL BISULFATE 75 MG PO TABS
75.0000 mg | ORAL_TABLET | Freq: Every day | ORAL | Status: DC
  Administered 2015-09-10: 75 mg via ORAL

## 2015-09-09 MED ORDER — ASPIRIN EC 81 MG PO TBEC: 81.0000 mg | DELAYED_RELEASE_TABLET | Freq: Every day | ORAL | Status: DC

## 2015-09-09 MED ORDER — CARVEDILOL 25 MG PO TABS
25.0000 mg | ORAL_TABLET | Freq: Two times a day (BID) | ORAL | Status: DC
  Administered 2015-09-09 – 2015-09-10 (×2): 25 mg via ORAL

## 2015-09-09 MED ORDER — SODIUM CHLORIDE 0.9 % IJ SOLN
3.0000 mL | Freq: Two times a day (BID) | INTRAMUSCULAR | Status: DC
  Administered 2015-09-09: 3 mL via INTRAVENOUS

## 2015-09-09 MED ORDER — ONDANSETRON HCL 4 MG/2ML IJ SOLN: 4.0000 mg | Freq: Four times a day (QID) | INTRAMUSCULAR | Status: DC | PRN

## 2015-09-09 MED ORDER — ASPIRIN EC 325 MG PO TBEC
325.0000 mg | DELAYED_RELEASE_TABLET | Freq: Every day | ORAL | Status: DC
  Administered 2015-09-10: 325 mg via ORAL

## 2015-09-09 MED ORDER — OLOPATADINE HCL 0.1 % OP SOLN
1.0000 [drp] | Freq: Two times a day (BID) | OPHTHALMIC | Status: DC
  Administered 2015-09-09: 1 [drp] via OPHTHALMIC
  Filled 2015-09-09: qty 5

## 2015-09-09 MED ORDER — CLOPIDOGREL BISULFATE 75 MG PO TABS: 75.0000 mg | ORAL_TABLET | Freq: Every day | ORAL | Status: DC

## 2015-09-09 MED ORDER — CARVEDILOL 25 MG PO TABS: 25.0000 mg | ORAL_TABLET | Freq: Two times a day (BID) | ORAL | Status: DC

## 2015-09-09 MED ORDER — ROSUVASTATIN CALCIUM 40 MG PO TABS
40.0000 mg | ORAL_TABLET | Freq: Every day | ORAL | Status: DC
  Administered 2015-09-09 – 2015-09-10 (×2): 40 mg via ORAL

## 2015-09-09 MED ORDER — ROSUVASTATIN CALCIUM 40 MG PO TABS
40.0000 mg | ORAL_TABLET | Freq: Every day | ORAL | Status: DC
  Filled 2015-09-09: qty 1

## 2015-09-09 MED ORDER — LANSOPRAZOLE 15 MG PO CPDR
15.0000 mg | DELAYED_RELEASE_CAPSULE | Freq: Every day | ORAL | Status: DC
Start: 2015-09-09 — End: 2015-09-10
  Administered 2015-09-09 – 2015-09-10 (×2): 15 mg via ORAL

## 2015-09-09 MED ORDER — FERROUS SULFATE 325 (65 FE) MG PO TABS
325.0000 mg | ORAL_TABLET | Freq: Every day | ORAL | Status: DC
  Administered 2015-09-10: 325 mg via ORAL
  Filled 2015-09-09: qty 1

## 2015-09-09 MED ORDER — FUROSEMIDE 40 MG PO TABS
40.0000 mg | ORAL_TABLET | Freq: Every day | ORAL | Status: DC
  Administered 2015-09-10: 40 mg via ORAL

## 2015-09-09 MED ORDER — AMLODIPINE BESYLATE 5 MG PO TABS: 5.0000 mg | ORAL_TABLET | Freq: Every day | ORAL | Status: DC

## 2015-09-09 NOTE — Progress Notes (Signed)
Pt urinary output for the shift 375 via foley. Output called to provider

## 2015-09-09 NOTE — ED Notes (Signed)
Dr.Gentry and RN notified of pt's Troponin of 0.17

## 2015-09-09 NOTE — ED Notes (Signed)
Istat in mini lab 

## 2015-09-09 NOTE — ED Notes (Signed)
Pt has had productive cough with clear with clear sputum for the past 3 weeks with slight sore throat. Denies leg swelling.

## 2015-09-09 NOTE — ED Notes (Signed)
Pt has had cough 3 weeks, was using diabetic tussin with little relief. No fever and clear septum. No feet swelling, hx of congestive heart failure/ open heart surgery. No pain noted,

## 2015-09-09 NOTE — Consult Note (Signed)
Primary cardiologist: Dr Radford Pax  HPI: 64 year old female with past medical history of diabetes mellitus, hypertension, hyperlipidemia, diastolic congestive heart failure, coronary artery disease status post coronary artery bypass and graft for evaluation of acute on chronic diastolic congestive heart failure and abnormal troponin. Patient States that for the past 3 weeks she has a cough productive of clear sputum.  She has dyspnea when she coughs area and the cough increases with lying flat. She denies dyspnea on exertion, orthopnea,, PND, pedal edema, chest pain, syncope, fevers, chills or hemoptysis.  Patient came to the emergency room and troponin noted to be mildly elevated. Cardiology asked to evaluate.   (Not in a hospital admission)  Allergies  Allergen Reactions  . Penicillins Rash    Has patient had a PCN reaction causing immediate rash, facial/tongue/throat swelling, SOB or lightheadedness with hypotension: Yes Has patient had a PCN reaction causing severe rash involving mucus membranes or skin necrosis: Yes Has patient had a PCN reaction that required hospitalization Yes- admitted to hospital Has patient had a PCN reaction occurring within the last 10 years: No, childhood allergy If all of the above answers are "NO", then may proceed with Cephalosporin use.   . Protamine     Internal bleeding around heart     Past Medical History  Diagnosis Date  . Hyperlipidemia   . Hypertension   . Arthritis   . Anemia   . Ischemic cardiomyopathy     EF 55% by cath 2010  . Bipolar affective disorder ([place])   . GERD (gastroesophageal reflux disease)   . Obesity   . Peripheral neuropathy ([place])   . Depression   . Osteoarthritis   . 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 Bilateral carotid bruits     60-79% bilateral ICA steosis being followed by Dr Early not stent candidate, may need sx in future  . Diabetes mellitus     retinopaty, neuropathy ischemic  cardiomyopathy with inferior hypokinesis EF normalized in 2005  . PVD (peripheral vascular disease) (HCC)     s/p PTA of left renal artery  . Coronary atherosclerosis     3 vessel s/p CABG - cath 2005 with widely patent grafts and distal disease past the grafts, cath 2010 with occluded SVG to IM and SVG to OM/PDA with PTCA of left circ and PCI of SVG to OM/PDA  . Carotid artery occlusion     60-79% bilateral ICA stenosis s/p Right CEA 2013  followed by Dr. Donnetta Hutching  . Chronic diastolic CHF (congestive heart failure) [facility])     Past Surgical History  Procedure Laterality Date  . Abdominal hysterectomy  1984  . Ptca  2003    with stent of left renal artery  . Tonsillectomy      AS A CHILD  . Endarterectomy  02/18/2012    Procedure: ENDARTERECTOMY CAROTID;  Surgeon: Rosetta Posner, MD;  Location: [facility] OR;  Service: Vascular;  Laterality: Right;  Right Carotid endarterectomy with Dacron patch angioplasty with resection of internal carotid artery  . Coronary artery bypass graft      3 vessel coronary disease S?P CABG with widely patent grafts by cath in 2005 and distal disease past the graft insertion sites.  . Cardiac catheterization  03/25/09    Occluded SVG to intermediate and SVG to OM/posterior descending artery with PTCA left circ 6/10 s/p PTCA stent in SVG to OM/PDA, 07/2002 s/p PCI of left circ 03/2009  . Carotid  endarterectomy  02/18/12    Right CEA    Social History   Social History  . Marital Status: Married    Spouse Name: N/A  . Number of Children: 3  . Years of Education: N/A   Occupational History  . DISABLED    Social History Main Topics  . Smoking status: Never Smoker   . Smokeless tobacco: Never Used  . Alcohol Use: No  . Drug Use: No  . Sexual Activity: No   Other Topics Concern  . Not on file   Social History Narrative    Family History  Problem Relation Age of Onset  . Heart disease Brother     Heart Disease before age 76  . Glaucoma Father   . Hypertension  Father   . Stroke Paternal Grandmother   . Coronary artery disease Paternal Grandmother   . Cerebral aneurysm Brother   . Clotting disorder Sister     ROS:  Cough productive of clear sputum but no fevers or chills, hemoptysis, dysphasia, odynophagia, melena, hematochezia, dysuria, hematuria, rash, seizure activity, orthopnea, PND, pedal edema, claudication. Remaining systems are negative.  Physical Exam:   Blood pressure 129/66, pulse 66, temperature 98.1 F (36.7 C), temperature source Oral, resp. rate 18, SpO2 100 %.  General:  Well developed/well nourished in NAD Skin warm/dry Patient not depressed No peripheral clubbing Back-normal HEENT-normal/normal eyelids Neck supple/normal carotid upstroke bilaterally; bilateral bruits; no JVD; no thyromegaly chest - CTA/ normal expansion CV - RRR/normal S1 and S2; no murmurs, rubs or gallops;  PMI nondisplaced Abdomen -NT/ND, no HSM, no mass, + bowel sounds, no bruit 2+ femoral pulses, no bruits Ext-1+ ankle edema, no chords, 1+ DP Neuro-grossly nonfocal  ECG NSR, ILBBB  Results for orders placed or performed during the hospital encounter of 09/09/15 (from the past 48 hour(s))  CBC with Differential     Status: Abnormal   Collection Time: 09/09/15  9:20 AM  Result Value Ref Range   WBC 2.8 (L) 4.0 - 10.5 K/uL   RBC 3.30 (L) 3.87 - 5.11 MIL/uL   Hemoglobin 9.6 (L) 12.0 - 15.0 g/dL   HCT 28.2 (L) 36.0 - 46.0 %   MCV 85.5 78.0 - 100.0 fL   MCH 29.1 26.0 - 34.0 pg   MCHC 34.0 30.0 - 36.0 g/dL   RDW 14.1 11.5 - 15.5 %   Platelets 139 (L) 150 - 400 K/uL   Neutrophils Relative % 42 %   Neutro Abs 1.2 (L) 1.7 - 7.7 K/uL   Lymphocytes Relative 36 %   Lymphs Abs 1.0 0.7 - 4.0 K/uL   Monocytes Relative 18 %   Monocytes Absolute 0.5 0.1 - 1.0 K/uL   Eosinophils Relative 3 %   Eosinophils Absolute 0.1 0.0 - 0.7 K/uL   Basophils Relative 1 %   Basophils Absolute 0.0 0.0 - 0.1 K/uL  Basic metabolic panel     Status: Abnormal    Collection Time: 09/09/15  9:20 AM  Result Value Ref Range   Sodium 133 (L) 135 - 145 mmol/L   Potassium 3.7 3.5 - 5.1 mmol/L   Chloride 101 101 - 111 mmol/L   CO2 26 22 - 32 mmol/L   Glucose, Bld 103 (H) 65 - 99 mg/dL   BUN 19 6 - 20 mg/dL   Creatinine, Ser 1.05 (H) 0.44 - 1.00 mg/dL   Calcium 8.9 8.9 - 10.3 mg/dL   GFR calc non Af Amer 54 (L) >60 mL/min   GFR calc Af Amer >  60 >60 mL/min    Comment: (NOTE) The eGFR has been calculated using the CKD EPI equation. This calculation has not been validated in all clinical situations. eGFR's persistently <60 mL/min signify possible Chronic Kidney Disease.    Anion gap 6 5 - 15  Brain natriuretic peptide     Status: Abnormal   Collection Time: 09/09/15  9:20 AM  Result Value Ref Range   B Natriuretic Peptide 1193.4 (H) 0.0 - 100.0 pg/mL  I-stat troponin, ED     Status: Abnormal   Collection Time: 09/09/15  9:35 AM  Result Value Ref Range   Troponin i, poc 0.17 (HH) 0.00 - 0.08 ng/mL   Comment NOTIFIED PHYSICIAN    Comment 3            Comment: Due to the release kinetics of cTnI, a negative result within the first hours of the onset of symptoms does not rule out myocardial infarction with certainty. If myocardial infarction is still suspected, repeat the test at appropriate intervals.     Dg Chest 2 View  09/09/2015  CLINICAL DATA:  64 year old female with cough, congestion and shortness of breath for the past 3 weeks EXAM: CHEST  2 VIEW COMPARISON:  Prior chest x-ray 10/14/2013 FINDINGS: Stable cardiomegaly with left heart enlargement. Patient is status post median sternotomy with evidence of multivessel CABG including LIMA bypass. Atherosclerotic calcifications are present in the transverse aorta. No pulmonary edema, focal airspace consolidation or pneumothorax. There are small bilateral pleural effusions. Right pleural effusion is similar compared to prior while the left is slightly more prominent. No suspicious pulmonary nodule  or mass. No acute osseous abnormality. IMPRESSION: 1. Small bilateral pleural effusions. 2. Stable cardiomegaly with left heart enlargement. No evidence of pulmonary edema. 3. Surgical changes of prior multivessel CABG. 4. Aortic atherosclerosis. Electronically Signed   By: Jacqulynn Cadet M.D.   On: 09/09/2015 09:39    Assessment/Plan 1 acute on chronic diastolic congestive heart failure-patient is mildly volume overloaded on examination with 1+ ankle edema. Would give Lasix 40 mg IV 1 and then increase lasix to 40 mg by mouth daily. Recheck renal function tomorrow morning. Repeat echocardiogram. 2 Elevated troponin-Likely secondary to congestive heart failure.  Patient has not had chest pain. Would repeat in 8 hours. If negative would not pursue further ischemia evaluation. 3 pancytopenia-management per primary care. May need hematology consult as an outpatient. 4 hypertension-continue present medications. 5 hyperlipidemia-continue Crestor and Zetia. 6 coronary artery disease status post coronary artery bypass graft-Continue aspirin, beta blocker and statin.  Kirk Ruths MD 09/09/2015, 1:45 PM

## 2015-09-09 NOTE — Progress Notes (Signed)
Patient reports not being able to take Generic medications d/t allergic reactions experienced in the past. Pt brought medications from home. Medications were counted in room in front of patient. Form completed and med's/form sent to pharmacy

## 2015-09-09 NOTE — Plan of Care (Signed)
Problem: Activity: Goal: Capacity to carry out activities will improve Outcome: Completed/Met Date Met:  09/09/15 Pt able to ambulate without oxygen 100% ra     

## 2015-09-09 NOTE — H&P (Addendum)
[facility] History and Physical  Penny [name] DOB: May 12, 1951 DOA: 09/09/2015  Referring physician: ED physician, Dr. Colin Rhein  PCP: Lucretia Kern., DO   Chief Complaint: dyspnea   HPI:  64 year-old female with diabetes mellitus, hypertension, hyperlipidemia, diastolic congestive heart failure, coronary artery disease status post coronary artery bypass and graft presented to [facility] ED with main concern of several weeks duration of progressively worsening dyspnea that initially started with exertion and has progressed to dyspnea at rest in the past 24 hours associated with mixed episodes on non productive and productive cough of clear sputum, worse with lying down flat and with no specific alleviating factors. Pt denies chest pain, abd or urinary concerns.   In ED, pt was hemodynamically stable, blood work notable for elevated troponin and cardiology was consulted. [facility] asked to admit for further evaluation.   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 Patient is status post median sternotomy with evidence of multivessel CABG including LIMA bypass. Atherosclerotic calcifications are present in the transverse aorta. No pulmonary edema, focal airspace consolidation or pneumothorax. There are small bilateral pleural effusions. Right pleural effusion is similar compared to prior while the left is slightly more prominent. No suspicious pulmonary nodule or mass. No acute osseous abnormality. IMPRESSION: 1. Small bilateral pleural effusions. 2. Stable cardiomegaly with left heart enlargement. No evidence of pulmonary edema. 3. Surgical changes of prior multivessel CABG. 4. Aortic atherosclerosis. Electronically Signed   By: Jacqulynn Cadet M.D.   On: 09/09/2015 09:39     Code Status: Full Family Communication: Pt at bedside Disposition Plan: Admit for further evaluation    Mart Piggs [facility] [id number]   Review of Systems:  Constitutional: Negative for fever, chills. Negative for diaphoresis.  HENT: Negative for hearing loss, ear pain, nosebleeds, congestion, sore throat, neck pain, tinnitus and ear discharge.   Eyes: Negative for blurred vision, double vision, photophobia, pain, discharge and redness.  Respiratory: Negative for wheezing and stridor.   Cardiovascular: Negative for chest pain, palpitations, orthopnea.  Gastrointestinal: Negative for nausea, vomiting and abdominal pain.  Genitourinary: Negative for dysuria, urgency, frequency, hematuria and flank pain.  Musculoskeletal: Negative for myalgias, back pain, joint pain and falls.  Skin: Negative for itching and rash.  Neurological: Negative for dizziness and weakness. Endo/Heme/Allergies: Negative for environmental allergies and polydipsia. Does not bruise/bleed easily.  Psychiatric/Behavioral: Negative for suicidal ideas. The patient is not nervous/anxious.  Past Medical History  Diagnosis Date  .  Hyperlipidemia   . Hypertension   . Arthritis   . Anemia   . Ischemic cardiomyopathy     EF 55% by cath 2010  . Bipolar affective disorder ([place])   . GERD (gastroesophageal reflux disease)   . Obesity   . Peripheral neuropathy ([place])   . Depression   . Osteoarthritis   . 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 Bilateral carotid bruits     60-79% bilateral ICA steosis being followed by Dr Early not stent candidate, may need sx in future  . 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 PVD (peripheral vascular disease) (HCC)     s/p PTA of left renal artery  . Coronary atherosclerosis     3 vessel s/p CABG - cath 2005 with widely patent grafts and distal disease past the grafts, cath 2010 with occluded SVG to IM and SVG to OM/PDA with PTCA of left circ and PCI of SVG to OM/PDA  . Carotid artery occlusion     60-79% bilateral ICA stenosis s/p Right CEA 2013  followed by Dr. Donnetta Hutching  . Chronic diastolic CHF (congestive heart failure) [facility])     Past Surgical History  Procedure Laterality Date  . Abdominal hysterectomy  1984  . Ptca  2003    with stent of left renal artery  . Tonsillectomy      AS A CHILD  . Endarterectomy  02/18/2012    Procedure: ENDARTERECTOMY CAROTID;  Surgeon: Rosetta Posner, MD;  Location: [facility] OR;  Service: Vascular;  Laterality: Right;  Right Carotid endarterectomy with Dacron patch angioplasty with resection of internal carotid artery  . Coronary artery bypass graft      3 vessel coronary disease S?P CABG with widely patent grafts by cath in 2005 and distal disease past the graft insertion sites.  . Cardiac catheterization  03/25/09    Occluded SVG to intermediate and SVG to OM/posterior descending artery with PTCA left circ 6/10 s/p PTCA stent in SVG to OM/PDA, 07/2002 s/p PCI of left circ 03/2009  . Carotid endarterectomy  02/18/12    Right CEA    Social History:   reports that she has never smoked. She has never used smokeless tobacco. She reports that she does not drink alcohol or use illicit drugs.  Allergies  Allergen Reactions  . Penicillins Rash    Has patient had a PCN reaction causing immediate rash, facial/tongue/throat swelling, SOB or lightheadedness with hypotension: Yes Has patient had a PCN reaction causing severe rash involving mucus membranes or skin necrosis: Yes Has patient had a PCN reaction that required hospitalization Yes- admitted to hospital Has patient had a PCN reaction occurring within the last 10 years: No, childhood allergy If all of the above answers are "NO", then may proceed with Cephalosporin use.   . Protamine     Internal bleeding around heart    Family History  Problem Relation Age of Onset  . Heart disease Brother     Heart Disease before age 61  . Glaucoma Father   . Hypertension Father   . Stroke Paternal Grandmother   . Coronary artery disease Paternal Grandmother   . Cerebral aneurysm Brother   . Clotting disorder Sister     Medication Sig  amLODipine (NORVASC) 5 MG tablet Take 5 mg by mouth daily.  aspirin EC 81 MG  tablet Take 81 mg by mouth daily.  COREG 25 MG tablet TAKE 1 TABLET TWICE A DAY WITH A MEAL  CRESTOR 40 MG tablet TAKE 1 TABLET BY MOUTH EVERY DAY  ferrous sulfate 325 (65 FE) MG tablet Take 325 mg by mouth daily with breakfast.  furosemide (LASIX) 20 MG tablet Take 1 tablet (20 mg total) by mouth daily.  olopatadine (PATANOL) 0.1 %  Place 1 drop into both eyes 2 (two) times daily.  PLAVIX 75 MG tablet Take 1 tablet (75 mg total) by mouth daily.  PREVACID 15 MG capsule TAKE ONE CAPSULE EVERY DAY  ZETIA 10 MG tablet TAKE 1 TABLET BY MOUTH EVERY DAY  NORVASC 10 MG tablet Take 0.5 tablets (5 mg total) by mouth daily    Physical Exam: Filed Vitals:   09/09/15 1116 09/09/15 1229 09/09/15 1409 09/09/15 1450  BP: 125/50 129/66 124/82 135/82  Pulse: 64 66 68 66  Temp:    97.8 F (36.6  C)  TempSrc:    Oral  Resp: 18 18 19 18   SpO2: 100% 100% 100% 100%    Physical Exam  Constitutional: Appears well-developed and well-nourished. No distress.  HENT: Normocephalic. External right and left ear normal. Oropharynx is clear and moist.  Eyes: Conjunctivae and EOM are normal. PERRLA, no scleral icterus.  Neck: Normal ROM. Neck supple.   CVS: RRR, S1/S2 +, no murmurs, no gallops, no carotid bruit.  Pulmonary: Effort and breath sounds normal, no stridor, rhonchi, wheezes, rales.  Abdominal: Soft. BS +,  no distension, tenderness, rebound or guarding.  Musculoskeletal: Normal range of motion. +1 bilateral lower extremity pitting edema  Lymphadenopathy: No lymphadenopathy noted, cervical, inguinal. Neuro: Alert. Normal reflexes, muscle tone coordination. No cranial nerve deficit. Skin: Skin is warm and dry. No rash noted. Not diaphoretic. No erythema. No pallor.  Psychiatric: Normal mood and affect. Behavior, judgment, thought content normal.   Labs on Admission:  Basic Metabolic Panel:  Recent Labs Lab 09/09/15 0920  NA 133*  K 3.7  CL 101  CO2 26  GLUCOSE 103*  BUN 19  CREATININE 1.05*  CALCIUM 8.9   CBC:  Recent Labs Lab 09/09/15 0920  WBC 2.8*  NEUTROABS 1.2*  HGB 9.6*  HCT 28.2*  MCV 85.5  PLT 139*    EKG: pending    If 7PM-7AM, please contact night-coverage [email or web address] Password [email or web address] 09/09/2015, 4:03 PM

## 2015-09-09 NOTE — Progress Notes (Addendum)
Call received from central monitored telemetry. Report given that patient had 13 beats VTACH. Pt was assessed directly, asymptomatic. Pt denied CP, SOB, dizziness, weakness and or N/V. VS 140/86, 74, 18, 100%ra. Provider contacted, no orders needed.

## 2015-09-09 NOTE — ED Provider Notes (Signed)
CSN: [id number]     Arrival date & time 09/09/15  [id number] History   First MD Initiated Contact with Patient 09/09/15 [phone]     Chief Complaint  Patient presents with  . Cough     (Consider location/radiation/quality/duration/timing/severity/associated sxs/prior Treatment) Patient is a 64 y.o. female presenting with cough.  Cough Cough characteristics:  Productive Sputum characteristics:  White Severity:  Moderate Onset quality:  Gradual Duration:  3 weeks Timing:  Intermittent Progression:  Unchanged Chronicity:  New Smoker: no   Context: not upper respiratory infection   Relieved by:  Nothing Worsened by:  Nothing tried Ineffective treatments:  Cough suppressants Associated symptoms: shortness of breath   Associated symptoms: no chest pain, no chills, no fever and no rash     Past Medical History  Diagnosis Date  . Hyperlipidemia   . Hypertension   . Arthritis   . Anemia   . Ischemic cardiomyopathy     EF 55% by cath 2010  . Bipolar affective disorder ([place])   . GERD (gastroesophageal reflux disease)   . Obesity   . Peripheral neuropathy ([place])   . Depression   . Osteoarthritis   . 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 Bilateral carotid bruits     60-79% bilateral ICA steosis being followed by Dr Early not stent candidate, may need sx in future  . 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 PVD (peripheral vascular disease) (HCC)     s/p PTA of left renal artery  . Coronary atherosclerosis     3 vessel s/p CABG - cath 2005 with widely patent grafts and distal disease past the grafts, cath 2010 with occluded SVG to IM and SVG to OM/PDA with PTCA of left circ and PCI of SVG to OM/PDA  . Carotid artery occlusion     60-79% bilateral ICA stenosis s/p Right CEA 2013  followed by Dr. Donnetta Hutching  . Chronic diastolic CHF (congestive heart failure) [facility])    Past Surgical History   Procedure Laterality Date  . Abdominal hysterectomy  1984  . Ptca  2003    with stent of left renal artery  . Tonsillectomy      AS A CHILD  . Endarterectomy  02/18/2012    Procedure: ENDARTERECTOMY CAROTID;  Surgeon: Rosetta Posner, MD;  Location: [facility] OR;  Service: Vascular;  Laterality: Right;  Right Carotid endarterectomy with Dacron patch angioplasty with resection of internal carotid artery  . Coronary artery bypass graft      3 vessel coronary disease S?P CABG with widely patent grafts by cath in 2005 and distal disease past the graft insertion sites.  . Cardiac catheterization  03/25/09    Occluded SVG to intermediate and SVG to OM/posterior descending artery with PTCA left circ 6/10 s/p PTCA stent in SVG to OM/PDA, 07/2002 s/p PCI of left circ 03/2009  . Carotid endarterectomy  02/18/12    Right CEA   Family History  Problem Relation Age of Onset  . Heart disease Brother     Heart Disease before age 64  . Glaucoma Father   . Hypertension Father   . Stroke Paternal Grandmother   . Coronary artery disease Paternal Grandmother   . Cerebral aneurysm Brother   . Clotting disorder Sister    Social History  Substance Use Topics  . Smoking status: Never Smoker   . Smokeless tobacco: Never Used  .  Alcohol Use: No   OB History    No data available     Review of Systems  Constitutional: Negative for fever and chills.  Respiratory: Positive for cough and shortness of breath.   Cardiovascular: Negative for chest pain.  Skin: Negative for rash.  All other systems reviewed and are negative.     Allergies  Penicillins and Protamine  Home Medications   Prior to Admission medications   Medication Sig Start Date End Date Taking? Authorizing Provider  amLODipine (NORVASC) 5 MG tablet Take 5 mg by mouth daily.   Yes Historical Provider, MD  aspirin EC 81 MG tablet Take 81 mg by mouth daily.   Yes Historical Provider, MD  BAYER MICROLET LANCETS lancets Use as instructed 08/19/15   Yes Lucretia Kern, DO  COREG 25 MG tablet TAKE 1 TABLET TWICE A DAY WITH A MEAL 03/25/15  Yes Sueanne Margarita, MD  CRESTOR 40 MG tablet TAKE 1 TABLET BY MOUTH EVERY DAY 07/21/15  Yes Lucretia Kern, DO  ferrous sulfate 325 (65 FE) MG tablet Take 325 mg by mouth daily with breakfast.   Yes Historical Provider, MD  Glucose Blood (BAYER BREEZE 2 TEST) DISK Test blood sugar four times a day 08/19/15  Yes Lucretia Kern, DO  Multiple Vitamins-Minerals (MULTIVITAL) tablet Take 1 tablet by mouth daily.    Yes Historical Provider, MD  olopatadine (PATANOL) 0.1 % ophthalmic solution Place 1 drop into both eyes 2 (two) times daily. 08/25/15  Yes Lucretia Kern, DO  PLAVIX 75 MG tablet Take 1 tablet (75 mg total) by mouth daily. 03/31/15  Yes Kennyth Arnold, FNP  PREVACID 15 MG capsule TAKE ONE CAPSULE EVERY DAY 07/22/15  Yes Lucretia Kern, DO  ZETIA 10 MG tablet TAKE 1 TABLET BY MOUTH EVERY DAY 07/06/15  Yes Sueanne Margarita, MD  furosemide (LASIX) 40 MG tablet Take 1 tablet (40 mg total) by mouth daily. 09/10/15   Theodis Blaze, MD   BP 137/56 mmHg  Pulse 73  Temp(Src) 98.7 F (37.1 C) (Oral)  Resp 16  Ht 5\' 3"  (1.6 m)  Wt 159 lb 12.8 oz (72.485 kg)  BMI 28.31 kg/m2  SpO2 99% Physical Exam  Constitutional: She is oriented to person, place, and time. She appears well-developed and well-nourished.  HENT:  Head: Normocephalic and atraumatic.  Right Ear: External ear normal.  Left Ear: External ear normal.  Eyes: Conjunctivae and EOM are normal. Pupils are equal, round, and reactive to light.  Neck: Normal range of motion. Neck supple.  Cardiovascular: Normal rate, regular rhythm, normal heart sounds and intact distal pulses.   Pulmonary/Chest: Effort normal and breath sounds normal.  Abdominal: Soft. Bowel sounds are normal. There is no tenderness.  Musculoskeletal: Normal range of motion.  Neurological: She is alert and oriented to person, place, and time.  Skin: Skin is warm and dry.  Vitals reviewed.   ED  Course  Procedures (including critical care time) Labs Review Labs Reviewed  CBC WITH DIFFERENTIAL/PLATELET - Abnormal; Notable for the following:    WBC 2.8 (*)    RBC 3.30 (*)    Hemoglobin 9.6 (*)    HCT 28.2 (*)    Platelets 139 (*)    Neutro Abs 1.2 (*)    All other components within normal limits  BASIC METABOLIC PANEL - Abnormal; Notable for the following:    Sodium 133 (*)    Glucose, Bld 103 (*)    Creatinine, Ser  1.05 (*)    GFR calc non Af Amer 54 (*)    All other components within normal limits  BRAIN NATRIURETIC PEPTIDE - Abnormal; Notable for the following:    B Natriuretic Peptide 1193.4 (*)    All other components within normal limits  TROPONIN I - Abnormal; Notable for the following:    Troponin I 0.18 (*)    All other components within normal limits  TROPONIN I - Abnormal; Notable for the following:    Troponin I 0.19 (*)    All other components within normal limits  TROPONIN I - Abnormal; Notable for the following:    Troponin I 0.16 (*)    All other components within normal limits  GLUCOSE, CAPILLARY - Abnormal; Notable for the following:    Glucose-Capillary 128 (*)    All other components within normal limits  BASIC METABOLIC PANEL - Abnormal; Notable for the following:    BUN 22 (*)    Calcium 8.8 (*)    All other components within normal limits  CBC - Abnormal; Notable for the following:    WBC 2.5 (*)    RBC 3.34 (*)    Hemoglobin 9.5 (*)    HCT 28.1 (*)    Platelets 134 (*)    All other components within normal limits  I-STAT TROPOININ, ED - Abnormal; Notable for the following:    Troponin i, poc 0.17 (*)    All other components within normal limits  I-STAT TROPOININ, ED - Abnormal; Notable for the following:    Troponin i, poc 0.16 (*)    All other components within normal limits  TSH  HEMOGLOBIN A1C    Imaging Review Dg Chest 2 View  09/09/2015  CLINICAL DATA:  64 year old female with cough, congestion and shortness of breath for  the past 3 weeks EXAM: CHEST  2 VIEW COMPARISON:  Prior chest x-ray 10/14/2013 FINDINGS: Stable cardiomegaly with left heart enlargement. Patient is status post median sternotomy with evidence of multivessel CABG including LIMA bypass. Atherosclerotic calcifications are present in the transverse aorta. No pulmonary edema, focal airspace consolidation or pneumothorax. There are small bilateral pleural effusions. Right pleural effusion is similar compared to prior while the left is slightly more prominent. No suspicious pulmonary nodule or mass. No acute osseous abnormality. IMPRESSION: 1. Small bilateral pleural effusions. 2. Stable cardiomegaly with left heart enlargement. No evidence of pulmonary edema. 3. Surgical changes of prior multivessel CABG. 4. Aortic atherosclerosis. Electronically Signed   By: Jacqulynn Cadet M.D.   On: 09/09/2015 09:39   I have personally reviewed and evaluated these images and lab results as part of my medical decision-making.   EKG Interpretation   Date/Time:  Friday September 09 2015 10:06:11 EST Ventricular Rate:  66 PR Interval:  187 QRS Duration: 126 QT Interval:  448 QTC Calculation: 469 R Axis:   71 Text Interpretation:  Sinus rhythm Probable left atrial enlargement Left  bundle branch block No significant change since last tracing Confirmed by  Debby Freiberg [phone]) on 09/09/2015 10:18:49 AM      MDM   Final diagnoses:  None    64 y.o. female with pertinent PMH of ischemic CHF presents with cough and extertional dyspnea x 3 weeks.  Sent by PCP for cardiac wu.  No worsening of symptoms.  On arrival vitals and physical exam as above, not consistent with volume overload, however pt has ho diastolic HF, so checked labs.  Trop elevated.  Consulted cardiology who requested medical admission.  Admitted in stable condition.  I have reviewed all laboratory and imaging studies if ordered as above  No diagnosis found.      Debby Freiberg, MD 09/11/15  [phone]

## 2015-09-10 LAB — BASIC METABOLIC PANEL
Anion gap: 8 (ref 5–15)
BUN: 22 mg/dL — ABNORMAL HIGH (ref 6–20)
CHLORIDE: 103 mmol/L (ref 101–111)
CO2: 25 mmol/L (ref 22–32)
Calcium: 8.8 mg/dL — ABNORMAL LOW (ref 8.9–10.3)
Creatinine, Ser: 0.91 mg/dL (ref 0.44–1.00)
Glucose, Bld: 96 mg/dL (ref 65–99)
POTASSIUM: 3.7 mmol/L (ref 3.5–5.1)
SODIUM: 136 mmol/L (ref 135–145)

## 2015-09-10 LAB — CBC
HEMATOCRIT: 28.1 % — AB (ref 36.0–46.0)
Hemoglobin: 9.5 g/dL — ABNORMAL LOW (ref 12.0–15.0)
MCH: 28.4 pg (ref 26.0–34.0)
MCHC: 33.8 g/dL (ref 30.0–36.0)
MCV: 84.1 fL (ref 78.0–100.0)
PLATELETS: 134 10*3/uL — AB (ref 150–400)
RBC: 3.34 MIL/uL — AB (ref 3.87–5.11)
RDW: 13.9 % (ref 11.5–15.5)
WBC: 2.5 10*3/uL — AB (ref 4.0–10.5)

## 2015-09-10 LAB — TROPONIN I
TROPONIN I: 0.16 ng/mL — AB (ref <0.031)
Troponin I: 0.19 ng/mL — ABNORMAL HIGH (ref <0.031)

## 2015-09-10 MED ORDER — FUROSEMIDE 40 MG PO TABS: 40.0000 mg | ORAL_TABLET | Freq: Every day | ORAL | Status: DC

## 2015-09-10 NOTE — Discharge Summary (Signed)
Physician Discharge Summary  Penny [name] DOB: 03/31/1951 DOA: 09/09/2015  PCP: Lucretia Kern., DO  Admit date: 09/09/2015 Discharge date: 09/10/2015  Recommendations for Outpatient Follow-up:  1. Pt will need to follow up with PCP in 2-3 weeks post discharge 2. Please obtain BMP to evaluate electrolytes and kidney function 3. Please also check CBC to evaluate Hg and Hct level, Plt, WBC 4. 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 (HCC)   Acute on chronic renal insufficiency (HCC)   Aortic atherosclerosis (HCC)   Benign essential HTN   Obesity   Discharge Condition: Stable  Diet recommendation: Heart healthy diet discussed in details   HPI: 64 year-old female with diabetes mellitus, hypertension, hyperlipidemia, diastolic congestive heart failure, coronary artery disease status post coronary artery bypass and graft presented to [facility] ED with main concern of several weeks duration of progressively worsening dyspnea that initially started with exertion and has progressed to dyspnea at rest in the past 24 hours associated with mixed episodes on non productive and productive cough of clear sputum, worse with lying down flat and with no specific alleviating factors. Pt denies chest pain, abd or urinary concerns.   In ED, pt was hemodynamically stable, blood work notable for elevated troponin and cardiology was consulted. TR[facility] asked to admit for further evaluation.   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 (HCC) - A1C pending upon discharge    Acute on chronic renal insufficiency (HCC) - monitor   Aortic atherosclerosis ([place]) - per cardiology   Benign essential HTN - continue home medical regimen    Obesity - BMI > 30   Discharge Exam: Filed Vitals:   09/10/15 0557  BP: 137/56  Pulse: 73  Temp: 98.7 F (37.1 C)  Resp: 16   Filed Vitals:   09/09/15 1409 09/09/15 1450 09/09/15 2150 09/10/15 0557  BP: 124/82 135/82 133/55 137/56  Pulse: 68 66 72 73  Temp:  97.8 F (36.6 C) 98.4 F (36.9 C) 98.7 F (37.1 C)  TempSrc:  Oral Oral Oral  Resp: 19 18 20 16   Height:   5\' 3"  (1.6 m)   Weight:   73.12 kg (161 lb 3.2 oz) 72.485 kg (159 lb 12.8 oz)  SpO2: 100% 100% 100% 99%    General: Pt is alert, follows commands appropriately, not in acute distress Cardiovascular: Regular rate and rhythm, no rubs, no gallops Respiratory: Clear to auscultation bilaterally, no wheezing, no crackles, no rhonchi Abdominal: Soft, non tender, non distended, bowel sounds +, no guarding  Discharge Instructions  Discharge Instructions    Diet - low sodium heart healthy    Complete by:  As directed      Increase activity slowly    Complete by:  As directed             Medication List    STOP  taking these medications        CRESTOR 40 MG tablet  Generic drug:  rosuvastatin      TAKE these medications        amLODipine 5 MG tablet  Commonly known as:  NORVASC  Take 5 mg by mouth daily.     aspirin EC 81 MG tablet  Take 81 mg by mouth daily.     BAYER MICROLET LANCETS lancets  Use as instructed     COREG 25 MG tablet  Generic drug:  carvedilol  TAKE 1 TABLET TWICE A DAY WITH A MEAL     ferrous sulfate 325 (65 FE) MG tablet  Take 325 mg by mouth daily with breakfast.     furosemide 40 MG tablet   Commonly known as:  LASIX  Take 1 tablet (40 mg total) by mouth daily.     Glucose Blood Disk  Commonly known as:  BAYER BREEZE 2 TEST  Test blood sugar four times a day     MULTIVITAL tablet  Take 1 tablet by mouth daily.     olopatadine 0.1 % ophthalmic solution  Commonly known as:  PATANOL  Place 1 drop into both eyes 2 (two) times daily.     PLAVIX 75 MG tablet  Generic drug:  clopidogrel  Take 1 tablet (75 mg total) by mouth daily.     PREVACID 15 MG capsule  Generic drug:  lansoprazole  TAKE ONE CAPSULE EVERY DAY     ZETIA 10 MG tablet  Generic drug:  ezetimibe  TAKE 1 TABLET BY MOUTH EVERY DAY            Follow-up Information    Follow up with Colin Benton R., DO.   Specialty:  Family Medicine   Contact information:   [place] [address] [phone]       Schedule an appointment as soon as possible for a visit with [facility].   Contact information:   [address] [place] [phone]      Call Faye Ramsay, MD.   Specialty:  Internal Medicine   Why:  As needed   Contact information:   [address] [place] [address] [phone]        The results of significant diagnostics from this hospitalization (including imaging, microbiology, ancillary and laboratory) are listed below for reference.     Microbiology: No results found for this or any previous visit (from the past 240 hour(s)).   Labs: Basic Metabolic Panel:  Recent Labs Lab 09/09/15 0920 09/10/15 0218  NA 133* 136  K 3.7 3.7  CL 101 103  CO2 26 25  GLUCOSE 103* 96  BUN 19 22*  CREATININE 1.05* 0.91  CALCIUM 8.9 8.8*   CBC:  Recent Labs Lab 09/09/15 0920 09/10/15 0218  WBC 2.8* 2.5*  NEUTROABS 1.2*  --   HGB 9.6* 9.5*  HCT 28.2* 28.1*  MCV 85.5 84.1  PLT 139* 134*   Cardiac Enzymes:  Recent Labs Lab 09/09/15 1950 09/10/15 0218 09/10/15 0802   TROPONINI 0.18* 0.19* 0.16*   BNP (last 3 results)  Recent Labs  09/09/15 0920  BNP 1193.4*    ProBNP (last 3 results)  Recent Labs  04/01/15 0738  PROBNP 460.0*   CBG:  Recent Labs Lab 09/09/15 1653  GLUCAP 128*   SIGNED: Time coordinating discharge:  30 minutes  Faye Ramsay, MD  [facility] 09/10/2015, 9:17 AM Pager  [phone]  If 7PM-7AM, please contact night-coverage [email or web address] Password [email or web address]

## 2015-09-10 NOTE — Progress Notes (Signed)
Discharge instruction reviewed. Patient denied questions and or concerns r/t teaching. Pt is A&Ox4 and ambulatory without assist.

## 2015-09-10 NOTE — Discharge Instructions (Signed)
Heart Failure  Heart failure means your heart has trouble pumping blood. This makes it hard for your body to work well. Heart failure is usually a long-term (chronic) condition. You must take good care of yourself and follow your doctor's treatment plan.  HOME CARE   Take your heart medicine as told by your doctor.    Do not stop taking medicine unless your doctor tells you to.    Do not skip any dose of medicine.    Refill your medicines before they run out.    Take other medicines only as told by your doctor or pharmacist.   Stay active if told by your doctor. The elderly and people with severe heart failure should talk with a doctor about physical activity.   Eat heart-healthy foods. Choose foods that are without trans fat and are low in saturated fat, cholesterol, and salt (sodium). This includes fresh or frozen fruits and vegetables, fish, lean meats, fat-free or low-fat dairy foods, whole grains, and high-fiber foods. Lentils and dried peas and beans (legumes) are also good choices.   Limit salt if told by your doctor.   Cook in a healthy way. Roast, grill, broil, bake, poach, steam, or stir-fry foods.   Limit fluids as told by your doctor.   Weigh yourself every morning. Do this after you pee (urinate) and before you eat breakfast. Write down your weight to give to your doctor.   Take your blood pressure and write it down if your doctor tells you to.   Ask your doctor how to check your pulse. Check your pulse as told.   Lose weight if told by your doctor.   Stop smoking or chewing tobacco. Do not use gum or patches that help you quit without your doctor's approval.   Schedule and go to doctor visits as told.   Nonpregnant women should have no more than 1 drink a day. Men should have no more than 2 drinks a day. Talk to your doctor about drinking alcohol.   Stop illegal drug use.   Stay current with shots (immunizations).   Manage your health conditions as told by your doctor.   Learn to  manage your stress.   Rest when you are tired.   If it is really hot outside:    Avoid intense activities.    Use air conditioning or fans, or get in a cooler place.    Avoid caffeine and alcohol.    Wear loose-fitting, lightweight, and light-colored clothing.   If it is really cold outside:    Avoid intense activities.    Layer your clothing.    Wear mittens or gloves, a hat, and a scarf when going outside.    Avoid alcohol.   Learn about heart failure and get support as needed.   Get help to maintain or improve your quality of life and your ability to care for yourself as needed.  GET HELP IF:    You gain weight quickly.   You are more short of breath than usual.   You cannot do your normal activities.   You tire easily.   You cough more than normal, especially with activity.   You have any or more puffiness (swelling) in areas such as your hands, feet, ankles, or belly (abdomen).   You cannot sleep because it is hard to breathe.   You feel like your heart is beating fast (palpitations).   You get dizzy or light-headed when you stand up.  GET HELP   RIGHT AWAY IF:    You have trouble breathing.   There is a change in mental status, such as becoming less alert or not being able to focus.   You have chest pain or discomfort.   You faint.  MAKE SURE YOU:    Understand these instructions.   Will watch your condition.   Will get help right away if you are not doing well or get worse.     This information is not intended to replace advice given to you by your health care provider. Make sure you discuss any questions you have with your health care provider.     Document Released: 07/24/2008 Document Revised: 11/05/2014 Document Reviewed: 12/01/2012  Elsevier Interactive Patient Education 2016 [employer]

## 2015-09-10 NOTE — Progress Notes (Signed)
Subjective: No CP  No SOB  Feels wonderful   Objective: Filed Vitals:   09/09/15 1409 09/09/15 1450 09/09/15 2150 09/10/15 0557  BP: 124/82 135/82 133/55 137/56  Pulse: 68 66 72 73  Temp:  97.8 F (36.6 C) 98.4 F (36.9 C) 98.7 F (37.1 C)  TempSrc:  Oral Oral Oral  Resp: 19 18 20 16   Height:   5\' 3"  (1.6 m)   Weight:   73.12 kg (161 lb 3.2 oz) 72.485 kg (159 lb 12.8 oz)  SpO2: 100% 100% 100% 99%   Weight change:   Intake/Output Summary (Last 24 hours) at 09/10/15 0739 Last data filed at 09/10/15 0545  Gross per 24 hour  Intake    240 ml  Output    650 ml  Net   -410 ml    General: Alert, awake, oriented x3, in no acute distress Neck:  JVP is normal Heart: Regular rate and rhythm, without murmurs, rubs, gallops.  Lungs: Clear to auscultation.  No rales or wheezes. Exemities:  Tr edema.   Neuro: Grossly intact, nonfocal. Tele  SR  With PVCs    Lab Results: Results for orders placed or performed during the hospital encounter of 09/09/15 (from the past 24 hour(s))  CBC with Differential     Status: Abnormal   Collection Time: 09/09/15  9:20 AM  Result Value Ref Range   WBC 2.8 (L) 4.0 - 10.5 K/uL   RBC 3.30 (L) 3.87 - 5.11 MIL/uL   Hemoglobin 9.6 (L) 12.0 - 15.0 g/dL   HCT 28.2 (L) 36.0 - 46.0 %   MCV 85.5 78.0 - 100.0 fL   MCH 29.1 26.0 - 34.0 pg   MCHC 34.0 30.0 - 36.0 g/dL   RDW 14.1 11.5 - 15.5 %   Platelets 139 (L) 150 - 400 K/uL   Neutrophils Relative % 42 %   Neutro Abs 1.2 (L) 1.7 - 7.7 K/uL   Lymphocytes Relative 36 %   Lymphs Abs 1.0 0.7 - 4.0 K/uL   Monocytes Relative 18 %   Monocytes Absolute 0.5 0.1 - 1.0 K/uL   Eosinophils Relative 3 %   Eosinophils Absolute 0.1 0.0 - 0.7 K/uL   Basophils Relative 1 %   Basophils Absolute 0.0 0.0 - 0.1 K/uL  Basic metabolic panel     Status: Abnormal   Collection Time: 09/09/15  9:20 AM  Result Value Ref Range   Sodium 133 (L) 135 - 145 mmol/L   Potassium 3.7 3.5 - 5.1 mmol/L   Chloride 101 101 - 111  mmol/L   CO2 26 22 - 32 mmol/L   Glucose, Bld 103 (H) 65 - 99 mg/dL   BUN 19 6 - 20 mg/dL   Creatinine, Ser 1.05 (H) 0.44 - 1.00 mg/dL   Calcium 8.9 8.9 - 10.3 mg/dL   GFR calc non Af Amer 54 (L) >60 mL/min   GFR calc Af Amer >60 >60 mL/min   Anion gap 6 5 - 15  Brain natriuretic peptide     Status: Abnormal   Collection Time: 09/09/15  9:20 AM  Result Value Ref Range   B Natriuretic Peptide 1193.4 (H) 0.0 - 100.0 pg/mL  I-stat troponin, ED     Status: Abnormal   Collection Time: 09/09/15  9:35 AM  Result Value Ref Range   Troponin i, poc 0.17 (HH) 0.00 - 0.08 ng/mL   Comment NOTIFIED PHYSICIAN    Comment 3          I-stat  troponin, ED     Status: Abnormal   Collection Time: 09/09/15  2:09 PM  Result Value Ref Range   Troponin i, poc 0.16 (HH) 0.00 - 0.08 ng/mL   Comment NOTIFIED PHYSICIAN    Comment 3          Glucose, capillary     Status: Abnormal   Collection Time: 09/09/15  4:53 PM  Result Value Ref Range   Glucose-Capillary 128 (H) 65 - 99 mg/dL   Comment 1 Notify RN    Comment 2 Document in Chart   TSH     Status: None   Collection Time: 09/09/15  7:50 PM  Result Value Ref Range   TSH 2.595 0.350 - 4.500 uIU/mL  Troponin I     Status: Abnormal   Collection Time: 09/09/15  7:50 PM  Result Value Ref Range   Troponin I 0.18 (H) <0.031 ng/mL  Troponin I     Status: Abnormal   Collection Time: 09/10/15  2:18 AM  Result Value Ref Range   Troponin I 0.19 (H) <0.031 ng/mL  Basic metabolic panel     Status: Abnormal   Collection Time: 09/10/15  2:18 AM  Result Value Ref Range   Sodium 136 135 - 145 mmol/L   Potassium 3.7 3.5 - 5.1 mmol/L   Chloride 103 101 - 111 mmol/L   CO2 25 22 - 32 mmol/L   Glucose, Bld 96 65 - 99 mg/dL   BUN 22 (H) 6 - 20 mg/dL   Creatinine, Ser 0.91 0.44 - 1.00 mg/dL   Calcium 8.8 (L) 8.9 - 10.3 mg/dL   GFR calc non Af Amer >60 >60 mL/min   GFR calc Af Amer >60 >60 mL/min   Anion gap 8 5 - 15  CBC     Status: Abnormal   Collection Time:  09/10/15  2:18 AM  Result Value Ref Range   WBC 2.5 (L) 4.0 - 10.5 K/uL   RBC 3.34 (L) 3.87 - 5.11 MIL/uL   Hemoglobin 9.5 (L) 12.0 - 15.0 g/dL   HCT 28.1 (L) 36.0 - 46.0 %   MCV 84.1 78.0 - 100.0 fL   MCH 28.4 26.0 - 34.0 pg   MCHC 33.8 30.0 - 36.0 g/dL   RDW 13.9 11.5 - 15.5 %   Platelets 134 (L) 150 - 400 K/uL    Studies/Results: Dg Chest 2 View  09/09/2015  CLINICAL DATA:  64 year old female with cough, congestion and shortness of breath for the past 3 weeks EXAM: CHEST  2 VIEW COMPARISON:  Prior chest x-ray 10/14/2013 FINDINGS: Stable cardiomegaly with left heart enlargement. Patient is status post median sternotomy with evidence of multivessel CABG including LIMA bypass. Atherosclerotic calcifications are present in the transverse aorta. No pulmonary edema, focal airspace consolidation or pneumothorax. There are small bilateral pleural effusions. Right pleural effusion is similar compared to prior while the left is slightly more prominent. No suspicious pulmonary nodule or mass. No acute osseous abnormality. IMPRESSION: 1. Small bilateral pleural effusions. 2. Stable cardiomegaly with left heart enlargement. No evidence of pulmonary edema. 3. Surgical changes of prior multivessel CABG. 4. Aortic atherosclerosis. Electronically Signed   By: Jacqulynn Cadet M.D.   On: 09/09/2015 09:39    Medications: {Reviewed   @PROBHOSP @  1.  Acute on chronic diastolc CHF Pt has diuresed (though output not quantitated)  ON exam, triv volume increase. Overall I think she is OK to go home  Would d/c home on 40 LAsix and add 20 KCL  Watch NA    2.  Elevated troponin  Miinimal elevation  Prob demand ischemia in settnig of CAD     3.  CAD  Hx of CABG  No symptoms of angina   4  HTN  BP is good    5  HL  COntinue statin and Zetia    6  Pancytopenia  WIll need to be followed     Will have pt appt in next 2 wks for labs and f/u  Pt does request something for cough  OK to d/c from cardiac  standpoint.      LOS: 1 day   Dorris Carnes 09/10/2015, 7:39 AM

## 2015-09-12 ENCOUNTER — Telehealth: Payer: Self-pay | Admitting: Cardiology

## 2015-09-12 LAB — HEMOGLOBIN A1C
Hgb A1c MFr Bld: 6.3 % — ABNORMAL HIGH (ref 4.8–5.6)
MEAN PLASMA GLUCOSE: 134 mg/dL

## 2015-09-12 NOTE — Telephone Encounter (Signed)
New problem    Pt has TCM w/Dr Radford Pax [phone] per staff message from [facility].

## 2015-09-12 NOTE — Telephone Encounter (Signed)
Patient contacted regarding discharge from [facility] on 09/10/15.  Patient understands to follow up with provider Dr. Fransico Him on 09/21/15 at 10:30 at [facility].. Patient understands discharge instructions? yes Patient understands medications and regiment? yes Patient understands to bring all medications to this visit? yes  States she is doing well.  Denies SOB or wt gain.  Wt today was 158 lbs.  States that is down from when in hospital. Reviewed medications with her.  States she did not get the Lasix.  States she has to have name brand not generic.  [facility] [facility] for Brand name of Lasix.  Understands to monitor wt and BP.  States her appointment with PCP is in Feb.  Advised she needs to call Dr. Julianne Rice office back and get an appointment in 2-3 wks.  She verbalizes understanding regarding her care.  Will call if wt increases.

## 2015-09-13 ENCOUNTER — Other Ambulatory Visit: Payer: Self-pay | Admitting: Cardiology

## 2015-09-13 DIAGNOSIS — I10 Essential (primary) hypertension: Secondary | ICD-10-CM

## 2015-09-13 DIAGNOSIS — R7989 Other specified abnormal findings of blood chemistry: Secondary | ICD-10-CM

## 2015-09-13 DIAGNOSIS — IMO0002 Reserved for concepts with insufficient information to code with codable children: Secondary | ICD-10-CM

## 2015-09-13 DIAGNOSIS — Z959 Presence of cardiac and vascular implant and graft, unspecified: Secondary | ICD-10-CM

## 2015-09-14 ENCOUNTER — Other Ambulatory Visit: Payer: Self-pay | Admitting: Cardiology

## 2015-09-16 ENCOUNTER — Telehealth: Payer: Self-pay | Admitting: Cardiology

## 2015-09-16 ENCOUNTER — Other Ambulatory Visit: Payer: Self-pay | Admitting: Cardiology

## 2015-09-16 NOTE — Telephone Encounter (Signed)
New message     *STAT* If patient is at the pharmacy, call can be transferred to refill team.   1. Which medications need to be refilled? (please list name of each medication and dose if known) crestor  40 mg   2. Which pharmacy/location (including street and city if local pharmacy) is medication to be sent to? [facility] [address] [phone]   3. Do they need a 30 day or 90 day supply? 90 days

## 2015-09-16 NOTE — Telephone Encounter (Signed)
[facility] and spoke with Clair Gulling, pharmacy tech, giving a verbal order for pt's medication crestor 40 mg tablet, which was sent in on 09/14/15, dispensing 30 tablet, with 11 refills. I gave the order to dispense 90 tablets, with 3 refills, for a 90 day supply as the pt requested. Pt's 90 day supply requested was taken care of.

## 2015-09-18 ENCOUNTER — Other Ambulatory Visit: Payer: Self-pay | Admitting: Cardiology

## 2015-09-21 ENCOUNTER — Encounter: Payer: Self-pay | Admitting: Cardiology

## 2015-09-21 ENCOUNTER — Inpatient Hospital Stay (HOSPITAL_COMMUNITY): Admission: RE | Admit: 2015-09-21 | Payer: Medicare Other | Source: Ambulatory Visit

## 2015-09-21 ENCOUNTER — Ambulatory Visit (INDEPENDENT_AMBULATORY_CARE_PROVIDER_SITE_OTHER): Payer: Medicare Other | Admitting: Cardiology

## 2015-09-21 VITALS — BP 130/62 | HR 69 | Ht 63.0 in | Wt 159.0 lb

## 2015-09-21 DIAGNOSIS — R011 Cardiac murmur, unspecified: Secondary | ICD-10-CM | POA: Diagnosis not present

## 2015-09-21 DIAGNOSIS — E782 Mixed hyperlipidemia: Secondary | ICD-10-CM | POA: Diagnosis not present

## 2015-09-21 DIAGNOSIS — R778 Other specified abnormalities of plasma proteins: Secondary | ICD-10-CM

## 2015-09-21 DIAGNOSIS — R7989 Other specified abnormal findings of blood chemistry: Secondary | ICD-10-CM

## 2015-09-21 DIAGNOSIS — I5032 Chronic diastolic (congestive) heart failure: Secondary | ICD-10-CM | POA: Diagnosis not present

## 2015-09-21 DIAGNOSIS — I2510 Atherosclerotic heart disease of native coronary artery without angina pectoris: Secondary | ICD-10-CM | POA: Diagnosis not present

## 2015-09-21 LAB — LIPID PANEL
CHOL/HDL RATIO: 3 ratio (ref <=5.0)
CHOLESTEROL: 96 mg/dL — AB (ref 125–200)
HDL: 32 mg/dL — ABNORMAL LOW (ref >=46)
LDL Cholesterol: 54 mg/dL (ref <130)
Triglycerides: 48 mg/dL (ref <150)
VLDL: 10 mg/dL (ref <30)

## 2015-09-21 LAB — HEPATIC FUNCTION PANEL
ALK PHOS: 50 U/L (ref 33–130)
ALT: 18 U/L (ref 6–29)
AST: 22 U/L (ref 10–35)
Albumin: 4.2 g/dL (ref 3.6–5.1)
BILIRUBIN DIRECT: 0.2 mg/dL (ref <=0.2)
Indirect Bilirubin: 0.4 mg/dL (ref 0.2–1.2)
Total Bilirubin: 0.6 mg/dL (ref 0.2–1.2)
Total Protein: 6.6 g/dL (ref 6.1–8.1)

## 2015-09-21 LAB — BASIC METABOLIC PANEL
BUN: 22 mg/dL (ref 7–25)
CHLORIDE: 98 mmol/L (ref 98–110)
CO2: 28 mmol/L (ref 20–31)
Calcium: 9.7 mg/dL (ref 8.6–10.4)
Creat: 1.01 mg/dL — ABNORMAL HIGH (ref 0.50–0.99)
GLUCOSE: 101 mg/dL — AB (ref 65–99)
POTASSIUM: 3.6 mmol/L (ref 3.5–5.3)
SODIUM: 135 mmol/L (ref 135–146)

## 2015-09-21 MED ORDER — NORVASC 5 MG PO TABS: 5.0000 mg | ORAL_TABLET | Freq: Every day | ORAL | Status: DC

## 2015-09-21 NOTE — Patient Instructions (Signed)
Medication Instructions:  Your physician recommends that you continue on your current medications as directed. Please refer to the Current Medication list given to you today.   Labwork: TODAY: BMET, LFTs, Lipids  Testing/Procedures: Your physician has requested that you have an echocardiogram. Echocardiography is a painless test that uses sound waves to create images of your heart. It provides your doctor with information about the size and shape of your heart and how well your heart's chambers and valves are working. This procedure takes approximately one hour. There are no restrictions for this procedure.   Your physician has requested that you have a lexiscan myoview. For further information please visit [email or web address]. Please follow instruction sheet, as given.  Follow-Up: Your physician wants you to follow-up in: 6 months with Dr. Radford Pax. You will receive a reminder letter in the mail two months in advance. If you don't receive a letter, please call our office to schedule the follow-up appointment.   Any Other Special Instructions Will Be Listed Below (If Applicable).     If you need a refill on your cardiac medications before your next appointment, please call your pharmacy.

## 2015-09-21 NOTE — Progress Notes (Signed)
Cardiology Office Note   Date:  09/21/2015   ID:  Penny James, DOB [birth year], MRN [id number]  PCP:  Lucretia Kern., DO    No chief complaint on file.     History of Present Illness: Penny James is a 64y.o. female with a history of HTN, ASCAD, chronic systolic CHF, ischemic DCM and dyslipidemia who presents today for followup. She was in the hospital last month with acute exacerbation of CHF and diuresed with IV lasix.  She was also noted to be pancytopenic and is getting outpt w/u.  She has been doing well since discharge. She denies any chest pain, SOB, DOE, orthopnea, PND,  LE edema, dizziness, palpitations or syncope. She walks for exercise.      Past Medical History  Diagnosis Date  . Hyperlipidemia   . Hypertension   . Arthritis   . Anemia   . Ischemic cardiomyopathy     EF 55% by cath 2010  . Bipolar affective disorder ([place])   . GERD (gastroesophageal reflux disease)   . Obesity   . Peripheral neuropathy ([place])   . Depression   . Osteoarthritis   . 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 Bilateral carotid bruits     60-79% bilateral ICA steosis being followed by Dr Early not stent candidate, may need sx in future  . 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 PVD (peripheral vascular disease) ([facility])     s/p PTA of left renal artery  . Coronary atherosclerosis     3 vessel s/p CABG - cath 2005 with widely patent grafts and distal disease past the grafts, cath 2010 with occluded SVG to IM and SVG to OM/PDA with PTCA of left circ and PCI of SVG to OM/PDA  . Carotid artery occlusion     60-79% bilateral ICA stenosis s/p Right CEA 2013  followed by Dr. Donnetta Hutching  . Chronic diastolic CHF (congestive heart failure) [facility])     Past Surgical History  Procedure Laterality Date  . Abdominal hysterectomy  1984  . Ptca  2003    with stent of  left renal artery  . Tonsillectomy      AS A CHILD  . Endarterectomy  02/18/2012    Procedure: ENDARTERECTOMY CAROTID;  Surgeon: Rosetta Posner, MD;  Location: [facility] OR;  Service: Vascular;  Laterality: Right;  Right Carotid endarterectomy with Dacron patch angioplasty with resection of internal carotid artery  . Coronary artery bypass graft      3 vessel coronary disease S?P CABG with widely patent grafts by cath in 2005 and distal disease past the graft insertion sites.  . Cardiac catheterization  03/25/09    Occluded SVG to intermediate and SVG to OM/posterior descending artery with PTCA left circ 6/10 s/p PTCA stent in SVG to OM/PDA, 07/2002 s/p PCI of left circ 03/2009  . Carotid endarterectomy  02/18/12    Right CEA     Current Outpatient Prescriptions  Medication Sig Dispense Refill  . amLODipine (NORVASC) 5 MG tablet Take 5 mg by mouth daily.    [name] aspirin EC 81 MG tablet Take 81 mg by mouth daily.    [name] BAYER MICROLET LANCETS lancets Use as instructed 100 each 3  . COREG 25 MG tablet TAKE 1 TABLET TWICE A DAY WITH  A MEAL 180 tablet 1  . CRESTOR 40 MG tablet TAKE 1 TABLET BY MOUTH EVERY DAY 30 tablet 11  . ferrous sulfate 325 (65 FE) MG tablet Take 325 mg by mouth daily with breakfast.    . furosemide (LASIX) 40 MG tablet Take 1 tablet (40 mg total) by mouth daily. 30 tablet 0  . Glucose Blood (BAYER BREEZE 2 TEST) DISK Test blood sugar four times a day 200 each 5  . Multiple Vitamins-Minerals (MULTIVITAL) tablet Take 1 tablet by mouth daily.     [name] olopatadine (PATANOL) 0.1 % ophthalmic solution Place 1 drop into both eyes 2 (two) times daily. 5 mL 1  . PLAVIX 75 MG tablet TAKE 1 TABLET BY MOUTH EVERY DAY 90 tablet 0  . PREVACID 15 MG capsule TAKE ONE CAPSULE EVERY DAY 90 capsule 0  . ZETIA 10 MG tablet TAKE 1 TABLET BY MOUTH EVERY DAY 90 tablet 0   No current facility-administered medications for this visit.    Allergies:   Penicillins; Protamine; and Amlodipine    Social History:   The patient  reports that she has never smoked. She has never used smokeless tobacco. She reports that she does not drink alcohol or use illicit drugs.   Family History:  The patient's family history includes Cerebral aneurysm in her brother; Clotting disorder in her sister; Coronary artery disease in her paternal grandmother; Glaucoma in her father; Heart disease in her brother; Hypertension in her father; Stroke in her paternal grandmother.    ROS:  Please see the history of present illness.   Otherwise, review of systems are positive for none.   All other systems are reviewed and negative.    PHYSICAL EXAM: VS:  BP 130/62 mmHg  Pulse 69  Ht 5\' 3"  (1.6 m)  Wt 72.122 kg (159 lb)  BMI 28.17 kg/m2  SpO2 99% , BMI Body mass index is 28.17 kg/(m^2). GEN: Well nourished, well developed, in no acute distress HEENT: normal Neck: no JVD, carotid bruits, or masses Cardiac: RRR; no rubs, or gallops,no edema.  2/6 SM at LLSB that increases with respirations Respiratory:  clear to auscultation bilaterally, normal work of breathing GI: soft, nontender, nondistended, + BS MS: no deformity or atrophy Skin: warm and dry, no rash Neuro:  Strength and sensation are intact Psych: euthymic mood, full affect   EKG:  EKG is not ordered today.    Recent Labs: 02/02/2015: ALT 32 04/01/2015: Pro B Natriuretic peptide (BNP) 460.0* 09/09/2015: B Natriuretic Peptide 1193.4*; TSH 2.595 09/10/2015: BUN 22*; Creatinine, Ser 0.91; Hemoglobin 9.5*; Platelets 134*; Potassium 3.7; Sodium 136    Lipid Panel    Component Value Date/Time   CHOL 107 02/02/2015 0957   TRIG 38.0 02/02/2015 0957   HDL 37.50* 02/02/2015 0957   CHOLHDL 3 02/02/2015 0957   VLDL 7.6 02/02/2015 0957   LDLCALC 62 02/02/2015 0957      Wt Readings from Last 3 Encounters:  09/21/15 72.122 kg (159 lb)  09/10/15 72.485 kg (159 lb 12.8 oz)  08/03/15 78.064 kg (172 lb 1.6 oz)    ASSESSMENT AND PLAN:  1. ASCAD with no angina -  cotinue ASA/Plavix  2. HTN well controlled - continue Carvedilol/amlodipine 3. Dyslipidemia - LDL at goal at 56 on 06/16/2014 - continue Crestor/Zetia  - Check FLP and ALT 4. Bilateral carotid artery stenosis s/p right CEA followed by Dr. Donnetta Hutching 5. Chronic diastolic CHF - appears euvolemic.  Recent CHF exacerbation. - continue Lasix and BB - check  BMET 6. Renal artery stenosis s/p PTA of left renal artery  7. Type II DM with vascular disease - per PCP       8.  Mildly elevated troponin in the setting of CHF - most likely demand ischemia but she has not had an ischemic w/u in several years.  I will get a Lexiscan myoview to rule out ischemia.         9.  Heart murmur, that by exam, sounds like TR.  I will check a 2D echo.    Current medicines are reviewed at length with the patient today.  The patient does not have concerns regarding medicines.  The following changes have been made:  no change  Labs/ tests ordered today: See above Assessment and Plan No orders of the defined types were placed in this encounter.     Disposition:   FU with me in 6 months  Signed, Sueanne Margarita, MD  09/21/2015 9:35 AM    [facility], [place], [place]  [address] Phone: [phone]; Fax: [phone]

## 2015-09-23 ENCOUNTER — Other Ambulatory Visit: Payer: Self-pay | Admitting: Cardiology

## 2015-09-23 ENCOUNTER — Other Ambulatory Visit: Payer: Self-pay | Admitting: Family Medicine

## 2015-09-27 ENCOUNTER — Telehealth: Payer: Self-pay | Admitting: Cardiology

## 2015-09-27 MED ORDER — FUROSEMIDE 40 MG PO TABS: 40.0000 mg | ORAL_TABLET | Freq: Every day | ORAL | Status: DC

## 2015-09-27 NOTE — Telephone Encounter (Signed)
Patient insistent that taking lasix 20 mg 2 tablets makes her cough.  She st when she was taking 40 mg tablets, the cough was nonexistent.  Lasix 40 mg called in to pharmacy of choice. Instructed patient to call PCP if cough ensues.

## 2015-09-27 NOTE — Telephone Encounter (Signed)
New Message    Pt calling stating that Dr. Radford Pax has her on 20 mg Lasix and she needs the 40 mg, when she takes 2 of the 20 mg she starts coughing really bad. Please call back and advise.      *STAT* If patient is at the pharmacy, call can be transferred to refill team.   1. Which medications need to be refilled? (please list name of each medication and dose if known) Norvasc  2. Which pharmacy/location (including street and city if local pharmacy) [facility] [address]  3. Do they need a 30 day or 90 day supply? 90 Day

## 2015-10-01 ENCOUNTER — Inpatient Hospital Stay (HOSPITAL_COMMUNITY)
Admission: EM | Admit: 2015-10-01 | Discharge: 2015-10-10 | DRG: 280 | Disposition: A | Discharge status: Home / Self Care | Payer: Medicare Other | Attending: Interventional Cardiology | Admitting: Interventional Cardiology

## 2015-10-01 ENCOUNTER — Encounter (HOSPITAL_COMMUNITY): Payer: Self-pay | Admitting: Emergency Medicine

## 2015-10-01 ENCOUNTER — Emergency Department (HOSPITAL_COMMUNITY): Payer: Medicare Other

## 2015-10-01 DIAGNOSIS — I700 Atherosclerosis of aorta: Secondary | ICD-10-CM | POA: Diagnosis present

## 2015-10-01 DIAGNOSIS — Z7982 Long term (current) use of aspirin: Secondary | ICD-10-CM | POA: Diagnosis not present

## 2015-10-01 DIAGNOSIS — I6529 Occlusion and stenosis of unspecified carotid artery: Secondary | ICD-10-CM | POA: Diagnosis present

## 2015-10-01 DIAGNOSIS — Z880 Allergy status to penicillin: Secondary | ICD-10-CM | POA: Diagnosis not present

## 2015-10-01 DIAGNOSIS — E782 Mixed hyperlipidemia: Secondary | ICD-10-CM | POA: Diagnosis present

## 2015-10-01 DIAGNOSIS — I272 Other secondary pulmonary hypertension: Secondary | ICD-10-CM | POA: Diagnosis present

## 2015-10-01 DIAGNOSIS — E871 Hypo-osmolality and hyponatremia: Secondary | ICD-10-CM | POA: Diagnosis present

## 2015-10-01 DIAGNOSIS — N189 Chronic kidney disease, unspecified: Secondary | ICD-10-CM | POA: Diagnosis not present

## 2015-10-01 DIAGNOSIS — T82857A Stenosis of cardiac prosthetic devices, implants and grafts, initial encounter: Secondary | ICD-10-CM | POA: Diagnosis present

## 2015-10-01 DIAGNOSIS — N179 Acute kidney failure, unspecified: Secondary | ICD-10-CM | POA: Diagnosis present

## 2015-10-01 DIAGNOSIS — E1122 Type 2 diabetes mellitus with diabetic chronic kidney disease: Secondary | ICD-10-CM | POA: Diagnosis present

## 2015-10-01 DIAGNOSIS — I5032 Chronic diastolic (congestive) heart failure: Secondary | ICD-10-CM

## 2015-10-01 DIAGNOSIS — R0602 Shortness of breath: Secondary | ICD-10-CM

## 2015-10-01 DIAGNOSIS — N182 Chronic kidney disease, stage 2 (mild): Secondary | ICD-10-CM | POA: Diagnosis present

## 2015-10-01 DIAGNOSIS — Z79899 Other long term (current) drug therapy: Secondary | ICD-10-CM

## 2015-10-01 DIAGNOSIS — I5033 Acute on chronic diastolic (congestive) heart failure: Secondary | ICD-10-CM | POA: Diagnosis not present

## 2015-10-01 DIAGNOSIS — I130 Hypertensive heart and chronic kidney disease with heart failure and stage 1 through stage 4 chronic kidney disease, or unspecified chronic kidney disease: Secondary | ICD-10-CM | POA: Diagnosis present

## 2015-10-01 DIAGNOSIS — I447 Left bundle-branch block, unspecified: Secondary | ICD-10-CM | POA: Diagnosis present

## 2015-10-01 DIAGNOSIS — E872 Acidosis: Secondary | ICD-10-CM | POA: Diagnosis present

## 2015-10-01 DIAGNOSIS — K5900 Constipation, unspecified: Secondary | ICD-10-CM | POA: Diagnosis present

## 2015-10-01 DIAGNOSIS — Z7902 Long term (current) use of antithrombotics/antiplatelets: Secondary | ICD-10-CM | POA: Diagnosis not present

## 2015-10-01 DIAGNOSIS — I255 Ischemic cardiomyopathy: Secondary | ICD-10-CM | POA: Diagnosis not present

## 2015-10-01 DIAGNOSIS — I25810 Atherosclerosis of coronary artery bypass graft(s) without angina pectoris: Secondary | ICD-10-CM | POA: Diagnosis present

## 2015-10-01 DIAGNOSIS — N289 Disorder of kidney and ureter, unspecified: Secondary | ICD-10-CM | POA: Diagnosis not present

## 2015-10-01 DIAGNOSIS — I214 Non-ST elevation (NSTEMI) myocardial infarction: Secondary | ICD-10-CM | POA: Diagnosis present

## 2015-10-01 DIAGNOSIS — Z6827 Body mass index (BMI) 27.0-27.9, adult: Secondary | ICD-10-CM | POA: Diagnosis not present

## 2015-10-01 DIAGNOSIS — I5043 Acute on chronic combined systolic (congestive) and diastolic (congestive) heart failure: Secondary | ICD-10-CM | POA: Diagnosis present

## 2015-10-01 DIAGNOSIS — I2510 Atherosclerotic heart disease of native coronary artery without angina pectoris: Secondary | ICD-10-CM | POA: Diagnosis present

## 2015-10-01 DIAGNOSIS — I10 Essential (primary) hypertension: Secondary | ICD-10-CM | POA: Diagnosis present

## 2015-10-01 DIAGNOSIS — I5023 Acute on chronic systolic (congestive) heart failure: Secondary | ICD-10-CM | POA: Diagnosis not present

## 2015-10-01 DIAGNOSIS — I509 Heart failure, unspecified: Secondary | ICD-10-CM

## 2015-10-01 DIAGNOSIS — J9601 Acute respiratory failure with hypoxia: Secondary | ICD-10-CM | POA: Diagnosis present

## 2015-10-01 DIAGNOSIS — I739 Peripheral vascular disease, unspecified: Secondary | ICD-10-CM | POA: Diagnosis present

## 2015-10-01 DIAGNOSIS — K219 Gastro-esophageal reflux disease without esophagitis: Secondary | ICD-10-CM | POA: Diagnosis present

## 2015-10-01 DIAGNOSIS — Z90710 Acquired absence of both cervix and uterus: Secondary | ICD-10-CM

## 2015-10-01 DIAGNOSIS — Z888 Allergy status to other drugs, medicaments and biological substances status: Secondary | ICD-10-CM

## 2015-10-01 DIAGNOSIS — E1151 Type 2 diabetes mellitus with diabetic peripheral angiopathy without gangrene: Secondary | ICD-10-CM | POA: Diagnosis present

## 2015-10-01 DIAGNOSIS — E669 Obesity, unspecified: Secondary | ICD-10-CM | POA: Diagnosis present

## 2015-10-01 DIAGNOSIS — E876 Hypokalemia: Secondary | ICD-10-CM | POA: Diagnosis present

## 2015-10-01 DIAGNOSIS — M1990 Unspecified osteoarthritis, unspecified site: Secondary | ICD-10-CM | POA: Diagnosis present

## 2015-10-01 DIAGNOSIS — I340 Nonrheumatic mitral (valve) insufficiency: Secondary | ICD-10-CM | POA: Diagnosis present

## 2015-10-01 DIAGNOSIS — R7989 Other specified abnormal findings of blood chemistry: Secondary | ICD-10-CM | POA: Diagnosis not present

## 2015-10-01 DIAGNOSIS — I110 Hypertensive heart disease with heart failure: Secondary | ICD-10-CM | POA: Diagnosis not present

## 2015-10-01 HISTORY — DX: Occlusion and stenosis of unspecified carotid artery: I65.29

## 2015-10-01 LAB — BASIC METABOLIC PANEL
Anion gap: 8 (ref 5–15)
BUN: 23 mg/dL — AB (ref 6–20)
CO2: 26 mmol/L (ref 22–32)
CREATININE: 1.08 mg/dL — AB (ref 0.44–1.00)
Calcium: 8.9 mg/dL (ref 8.9–10.3)
Chloride: 96 mmol/L — ABNORMAL LOW (ref 101–111)
GFR calc Af Amer: >60 mL/min (ref >60)
GFR, EST NON AFRICAN AMERICAN: 52 mL/min — AB (ref >60)
Glucose, Bld: 113 mg/dL — ABNORMAL HIGH (ref 65–99)
POTASSIUM: 4 mmol/L (ref 3.5–5.1)
SODIUM: 130 mmol/L — AB (ref 135–145)

## 2015-10-01 LAB — URINALYSIS, ROUTINE W REFLEX MICROSCOPIC
BILIRUBIN URINE: NEGATIVE
Glucose, UA: NEGATIVE mg/dL
Hgb urine dipstick: NEGATIVE
Ketones, ur: NEGATIVE mg/dL
Leukocytes, UA: NEGATIVE
NITRITE: NEGATIVE
Protein, ur: NEGATIVE mg/dL
SPECIFIC GRAVITY, URINE: 1.009 (ref 1.005–1.030)
pH: 5 (ref 5.0–8.0)

## 2015-10-01 LAB — COMPREHENSIVE METABOLIC PANEL
ALK PHOS: 58 U/L (ref 38–126)
ALT: 37 U/L (ref 14–54)
ANION GAP: 9 (ref 5–15)
AST: 34 U/L (ref 15–41)
Albumin: 4.3 g/dL (ref 3.5–5.0)
BUN: 25 mg/dL — ABNORMAL HIGH (ref 6–20)
CHLORIDE: 97 mmol/L — AB (ref 101–111)
CO2: 24 mmol/L (ref 22–32)
Calcium: 8.8 mg/dL — ABNORMAL LOW (ref 8.9–10.3)
Creatinine, Ser: 0.86 mg/dL (ref 0.44–1.00)
Glucose, Bld: 180 mg/dL — ABNORMAL HIGH (ref 65–99)
POTASSIUM: 4 mmol/L (ref 3.5–5.1)
SODIUM: 130 mmol/L — AB (ref 135–145)
Total Bilirubin: 1 mg/dL (ref 0.3–1.2)
Total Protein: 7.2 g/dL (ref 6.5–8.1)

## 2015-10-01 LAB — BLOOD GAS, ARTERIAL
ACID-BASE DEFICIT: 4 mmol/L — AB (ref 0.0–2.0)
Acid-base deficit: 5.3 mmol/L — ABNORMAL HIGH (ref 0.0–2.0)
BICARBONATE: 23.1 meq/L (ref 20.0–24.0)
Bicarbonate: 21.2 mEq/L (ref 20.0–24.0)
DELIVERY SYSTEMS: POSITIVE
DRAWN BY: 441381
Delivery systems: POSITIVE
Drawn by: 441381
EXPIRATORY PAP: 5
Expiratory PAP: 6
FIO2: 100
FIO2: 50
INSPIRATORY PAP: 16
Inspiratory PAP: 12
MODE: POSITIVE
MODE: POSITIVE
O2 SAT: 93.6 %
O2 Saturation: 98.2 %
PCO2 ART: 42.2 mmHg (ref 35.0–45.0)
PH ART: 7.323 — AB (ref 7.350–7.450)
Patient temperature: 98.6
Patient temperature: 98.6
TCO2: 22.1 mmol/L (ref 0–100)
TCO2: 19.8 mmol/L (ref 0–100)
pCO2 arterial: 62.1 mmHg (ref 35.0–45.0)
pH, Arterial: 7.195 — CL (ref 7.350–7.450)
pO2, Arterial: 153 mmHg — ABNORMAL HIGH (ref 80.0–100.0)
pO2, Arterial: 77.2 mmHg — ABNORMAL LOW (ref 80.0–100.0)

## 2015-10-01 LAB — CBC
HCT: 28.6 % — ABNORMAL LOW (ref 36.0–46.0)
HEMATOCRIT: 32.1 % — AB (ref 36.0–46.0)
Hemoglobin: 9.9 g/dL — ABNORMAL LOW (ref 12.0–15.0)
Hemoglobin: 10.9 g/dL — ABNORMAL LOW (ref 12.0–15.0)
MCH: 29 pg (ref 26.0–34.0)
MCH: 28.8 pg (ref 26.0–34.0)
MCHC: 34.6 g/dL (ref 30.0–36.0)
MCHC: 34 g/dL (ref 30.0–36.0)
MCV: 83.9 fL (ref 78.0–100.0)
MCV: 84.7 fL (ref 78.0–100.0)
PLATELETS: 143 10*3/uL — AB (ref 150–400)
Platelets: 185 10*3/uL (ref 150–400)
RBC: 3.41 MIL/uL — ABNORMAL LOW (ref 3.87–5.11)
RBC: 3.79 MIL/uL — ABNORMAL LOW (ref 3.87–5.11)
RDW: 13.8 % (ref 11.5–15.5)
RDW: 13.9 % (ref 11.5–15.5)
WBC: 3.5 10*3/uL — AB (ref 4.0–10.5)
WBC: 10.5 10*3/uL (ref 4.0–10.5)

## 2015-10-01 LAB — I-STAT TROPONIN, ED: TROPONIN I, POC: 0.01 ng/mL (ref 0.00–0.08)

## 2015-10-01 LAB — BRAIN NATRIURETIC PEPTIDE: B NATRIURETIC PEPTIDE 5: 1768.2 pg/mL — AB (ref 0.0–100.0)

## 2015-10-01 LAB — MRSA PCR SCREENING: MRSA by PCR: NEGATIVE

## 2015-10-01 MED ORDER — OLOPATADINE HCL 0.1 % OP SOLN
1.0000 [drp] | Freq: Two times a day (BID) | OPHTHALMIC | Status: DC
  Administered 2015-10-02 – 2015-10-07 (×11): 1 [drp] via OPHTHALMIC
  Filled 2015-10-01 (×2): qty 5

## 2015-10-01 MED ORDER — CARVEDILOL 12.5 MG PO TABS
12.5000 mg | ORAL_TABLET | Freq: Two times a day (BID) | ORAL | Status: DC
  Administered 2015-10-01 – 2015-10-06 (×10): 12.5 mg via ORAL
  Filled 2015-10-01 (×10): qty 1

## 2015-10-01 MED ORDER — FUROSEMIDE 10 MG/ML IJ SOLN
INTRAMUSCULAR | Status: AC
  Administered 2015-10-01: 40 mg
  Filled 2015-10-01: qty 2

## 2015-10-01 MED ORDER — METHYLPREDNISOLONE SODIUM SUCC 125 MG IJ SOLR
125.0000 mg | Freq: Once | INTRAMUSCULAR | Status: AC
  Administered 2015-10-01: 125 mg via INTRAVENOUS
  Filled 2015-10-01: qty 2

## 2015-10-01 MED ORDER — CLOPIDOGREL BISULFATE 75 MG PO TABS
75.0000 mg | ORAL_TABLET | Freq: Every day | ORAL | Status: DC
  Administered 2015-10-02 – 2015-10-06 (×5): 75 mg via ORAL
  Filled 2015-10-01 (×5): qty 1

## 2015-10-01 MED ORDER — ALBUTEROL SULFATE (2.5 MG/3ML) 0.083% IN NEBU: 2.5000 mg | INHALATION_SOLUTION | Freq: Four times a day (QID) | RESPIRATORY_TRACT | Status: DC | PRN

## 2015-10-01 MED ORDER — EZETIMIBE 10 MG PO TABS
10.0000 mg | ORAL_TABLET | Freq: Every day | ORAL | Status: DC
  Administered 2015-10-01 – 2015-10-06 (×5): 10 mg via ORAL
  Filled 2015-10-01 (×7): qty 1

## 2015-10-01 MED ORDER — ALBUTEROL SULFATE HFA 108 (90 BASE) MCG/ACT IN AERS: 2.0000 | INHALATION_SPRAY | Freq: Four times a day (QID) | RESPIRATORY_TRACT | Status: DC | PRN

## 2015-10-01 MED ORDER — FUROSEMIDE 10 MG/ML IJ SOLN: 60.0000 mg | Freq: Once | INTRAMUSCULAR | Status: AC

## 2015-10-01 MED ORDER — ENOXAPARIN SODIUM 40 MG/0.4ML ~~LOC~~ SOLN
40.0000 mg | SUBCUTANEOUS | Status: DC
  Administered 2015-10-01: 40 mg via SUBCUTANEOUS
  Filled 2015-10-01: qty 0.4

## 2015-10-01 MED ORDER — FUROSEMIDE 10 MG/ML IJ SOLN
40.0000 mg | Freq: Once | INTRAMUSCULAR | Status: AC
  Administered 2015-10-01: 40 mg via INTRAVENOUS
  Filled 2015-10-01: qty 4

## 2015-10-01 MED ORDER — FUROSEMIDE 10 MG/ML IJ SOLN: 40.0000 mg | Freq: Two times a day (BID) | INTRAMUSCULAR | Status: DC

## 2015-10-01 MED ORDER — DIPHENHYDRAMINE HCL 50 MG/ML IJ SOLN
50.0000 mg | Freq: Once | INTRAMUSCULAR | Status: AC
  Administered 2015-10-01: 50 mg via INTRAVENOUS
  Filled 2015-10-01: qty 1

## 2015-10-01 MED ORDER — ROSUVASTATIN CALCIUM 20 MG PO TABS
40.0000 mg | ORAL_TABLET | Freq: Every day | ORAL | Status: DC
  Administered 2015-10-02 – 2015-10-09 (×7): 40 mg via ORAL
  Filled 2015-10-01 (×3): qty 2
  Filled 2015-10-01: qty 1
  Filled 2015-10-01 (×3): qty 2
  Filled 2015-10-01 (×2): qty 1
  Filled 2015-10-01: qty 2
  Filled 2015-10-01: qty 1

## 2015-10-01 MED ORDER — FUROSEMIDE 10 MG/ML IJ SOLN
INTRAMUSCULAR | Status: AC
  Administered 2015-10-01: 20 mg
  Filled 2015-10-01: qty 4

## 2015-10-01 MED ORDER — FERROUS SULFATE 325 (65 FE) MG PO TABS
325.0000 mg | ORAL_TABLET | Freq: Every day | ORAL | Status: DC
  Administered 2015-10-02 – 2015-10-10 (×9): 325 mg via ORAL
  Filled 2015-10-01 (×9): qty 1

## 2015-10-01 MED ORDER — FAMOTIDINE IN NACL 20-0.9 MG/50ML-% IV SOLN
20.0000 mg | Freq: Once | INTRAVENOUS | Status: AC
  Administered 2015-10-01: 20 mg via INTRAVENOUS
  Filled 2015-10-01: qty 50

## 2015-10-01 MED ORDER — LORAZEPAM 2 MG/ML IJ SOLN
0.5000 mg | Freq: Once | INTRAMUSCULAR | Status: AC
  Administered 2015-10-01: 0.5 mg via INTRAVENOUS
  Filled 2015-10-01: qty 1

## 2015-10-01 MED ORDER — PANTOPRAZOLE SODIUM 20 MG PO TBEC
20.0000 mg | DELAYED_RELEASE_TABLET | Freq: Every day | ORAL | Status: DC
  Administered 2015-10-02 – 2015-10-06 (×4): 20 mg via ORAL
  Filled 2015-10-01 (×6): qty 1

## 2015-10-01 MED ORDER — ASPIRIN EC 81 MG PO TBEC
81.0000 mg | DELAYED_RELEASE_TABLET | Freq: Every day | ORAL | Status: DC
  Administered 2015-10-02 – 2015-10-04 (×3): 81 mg via ORAL
  Filled 2015-10-01 (×3): qty 1

## 2015-10-01 MED ORDER — FUROSEMIDE 10 MG/ML IJ SOLN
80.0000 mg | Freq: Once | INTRAMUSCULAR | Status: AC
  Administered 2015-10-01: 80 mg via INTRAVENOUS
  Filled 2015-10-01: qty 8

## 2015-10-01 NOTE — ED Notes (Signed)
Hospitalist at bedside 

## 2015-10-01 NOTE — ED Notes (Signed)
Per pt, states she took some red yeast to open up her arteries-states she i now SOB and having chest tightness

## 2015-10-01 NOTE — H&P (Signed)
History and Physical  Penny James [id number] DOB: 12-06-1950 DOA: 10/01/2015  Referring physician: EDP PCP: Lucretia Kern., DO   Chief Complaint: sob  HPI: Penny James is a 64 y.o. female   With h/o HTN, HLD, h/o diet controled diabetes ( most recent A1c 6.3),  renal artery stenosis s/p PTA of the left renal artery, carotid artery stenosis s/o right CEA in 2013, CAD s/p  3 vessel CABG and combined diastolic and systolic chf (last known  EF 53% on 2010)presented to the ED due to sob and lower extremity edema, she was hospitalized for one day for the same a few weeks ago and was discharged on a higher dose of lasix. She was seen by her primary cardiology for post discharge follow up on 11/23. She reported last night she developed sudden onset of sob and noticed some lower extremity , denies chest pain. She does has some minimal productive cough, no fever, she never smokes. She reported she took some red yeast rice extract wonder if she is allergic to it, she denies lip/tongue or facial edema.  ED course: cxr consistent with chf, first set of troponin at baseline. Elevated bnp, ekg no acute changes, she received solumedrol due to concern about allergic reaction, she also received iv lasix, she was initially on 6liter, then was put on NRB due to oxygen saturation dropped to the 70's on 6 liter.  hospitalist called to admit the patient.  Review of Systems:  Detail per HPI, Review of systems are otherwise negative  Past Medical History  Diagnosis Date  . Hyperlipidemia   . Hypertension   . Arthritis   . Anemia   . Ischemic cardiomyopathy     EF 55% by cath 2010  . Bipolar affective disorder ([place])   . GERD (gastroesophageal reflux disease)   . Obesity   . Peripheral neuropathy ([place])   . Depression   . Osteoarthritis   . 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 Bilateral carotid bruits     60-79% bilateral ICA steosis being followed by Dr Early not  stent candidate, may need sx in future  . 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 PVD (peripheral vascular disease) ([facility])     s/p PTA of left renal artery  . Coronary atherosclerosis     3 vessel s/p CABG - cath 2005 with widely patent grafts and distal disease past the grafts, cath 2010 with occluded SVG to IM and SVG to OM/PDA with PTCA of left circ and PCI of SVG to OM/PDA  . Carotid artery occlusion     60-79% bilateral ICA stenosis s/p Right CEA 2013  followed by Dr. Donnetta Hutching  . Chronic diastolic CHF (congestive heart failure) [facility])    Past Surgical History  Procedure Laterality Date  . Abdominal hysterectomy  1984  . Ptca  2003    with stent of left renal artery  . Tonsillectomy      AS A CHILD  . Endarterectomy  02/18/2012    Procedure: ENDARTERECTOMY CAROTID;  Surgeon: Rosetta Posner, MD;  Location: [facility] OR;  Service: Vascular;  Laterality: Right;  Right Carotid endarterectomy with Dacron patch angioplasty with resection of internal carotid artery  . Coronary artery bypass graft      3 vessel coronary disease S?P CABG with widely patent grafts by cath in 2005 and distal disease past the graft insertion  sites.  . Cardiac catheterization  03/25/09    Occluded SVG to intermediate and SVG to OM/posterior descending artery with PTCA left circ 6/10 s/p PTCA stent in SVG to OM/PDA, 07/2002 s/p PCI of left circ 03/2009  . Carotid endarterectomy  02/18/12    Right CEA   Social History:  reports that she has never smoked. She has never used smokeless tobacco. She reports that she does not drink alcohol or use illicit drugs. Patient lives at home & is able to participate in activities of daily living independently   Allergies  Allergen Reactions  . Penicillins Rash    Has patient had a PCN reaction causing immediate rash, facial/tongue/throat swelling, SOB or lightheadedness with hypotension: Yes Has patient had a PCN  reaction causing severe rash involving mucus membranes or skin necrosis: Yes Has patient had a PCN reaction that required hospitalization Yes- admitted to hospital Has patient had a PCN reaction occurring within the last 10 years: No, childhood allergy If all of the above answers are "NO", then may proceed with Cephalosporin use.   . Protamine     Internal bleeding around heart  . Amlodipine Swelling    Causes swelling in higher dosage    Family History  Problem Relation Age of Onset  . Heart disease Brother     Heart Disease before age 20  . Glaucoma Father   . Hypertension Father   . Stroke Paternal Grandmother   . Coronary artery disease Paternal Grandmother   . Cerebral aneurysm Brother   . Clotting disorder Sister       Prior to Admission medications   Medication Sig Start Date End Date Taking? Authorizing Provider  aspirin EC 81 MG tablet Take 81 mg by mouth daily.   Yes Historical Provider, MD  COREG 25 MG tablet TAKE 1 TABLET TWICE A DAY WITH A MEAL 09/26/15  Yes Traci R Turner, MD  CRESTOR 40 MG tablet TAKE 1 TABLET BY MOUTH EVERY DAY 09/14/15  Yes Sueanne Margarita, MD  ferrous sulfate 325 (65 FE) MG tablet Take 325 mg by mouth daily with breakfast.   Yes Historical Provider, MD  furosemide (LASIX) 40 MG tablet Take 1 tablet (40 mg total) by mouth daily. 09/27/15  Yes Sueanne Margarita, MD  Multiple Vitamins-Minerals (MULTIVITAL) tablet Take 1 tablet by mouth daily.    Yes Historical Provider, MD  NORVASC 5 MG tablet Take 1 tablet (5 mg total) by mouth daily. 09/21/15  Yes Sueanne Margarita, MD  olopatadine (PATANOL) 0.1 % ophthalmic solution Place 1 drop into both eyes 2 (two) times daily. 08/25/15  Yes Lucretia Kern, DO  PLAVIX 75 MG tablet TAKE 1 TABLET BY MOUTH EVERY DAY 09/19/15  Yes Sueanne Margarita, MD  PREVACID 15 MG capsule TAKE ONE CAPSULE EVERY DAY 07/22/15  Yes Lucretia Kern, DO  Red Yeast Rice Extract (RED YEAST RICE PO) Take 2 each by mouth once.   Yes Historical  Provider, MD  ZETIA 10 MG tablet TAKE 1 TABLET BY MOUTH EVERY DAY 07/06/15  Yes Sueanne Margarita, MD  BAYER MICROLET LANCETS lancets Use as instructed 08/19/15   Lucretia Kern, DO  Glucose Blood (BAYER BREEZE 2 TEST) DISK Test blood sugar four times a day 08/19/15   Lucretia Kern, DO  NORVASC 5 MG tablet TAKE 1 TABLET BY MOUTH EVERY DAY Patient not taking: Reported on 10/01/2015 09/24/15   Lucretia Kern, DO    Physical Exam: BP 160/73 mmHg  Pulse 80  Temp(Src) 97.8 F (36.6 C) (Temporal)  Resp 33  SpO2 99%  General:  In moderate distress due to sob Eyes: PERRL ENT: unremarkable Neck: supple, no JVD Cardiovascular: RRR Respiratory: diffuse wheezing Abdomen: soft/ND/ND, positive bowel sounds Skin: no rash Musculoskeletal:  Mild pitting edema bilateral ankle Psychiatric: anxious Neurologic: no focal findings            Labs on Admission:  Basic Metabolic Panel:  Recent Labs Lab 10/01/15 1323  NA 130*  K 4.0  CL 96*  CO2 26  GLUCOSE 113*  BUN 23*  CREATININE 1.08*  CALCIUM 8.9   Liver Function Tests: No results for input(s): AST, ALT, ALKPHOS, BILITOT, PROT, ALBUMIN in the last 168 hours. No results for input(s): LIPASE, AMYLASE in the last 168 hours. No results for input(s): AMMONIA in the last 168 hours. CBC:  Recent Labs Lab 10/01/15 1323  WBC 3.5*  HGB 9.9*  HCT 28.6*  MCV 83.9  PLT 143*   Cardiac Enzymes: No results for input(s): CKTOTAL, CKMB, CKMBINDEX, TROPONINI in the last 168 hours.  BNP (last 3 results)  Recent Labs  09/09/15 0920 10/01/15 1324  BNP 1193.4* 1768.2*    ProBNP (last 3 results)  Recent Labs  04/01/15 0738  PROBNP 460.0*    CBG: No results for input(s): GLUCAP in the last 168 hours.  Radiological Exams on Admission: Dg Chest 2 View  10/01/2015  CLINICAL DATA:  Shortness of breath EXAM: CHEST  2 VIEW COMPARISON:  09/09/2015 FINDINGS: Chronic cardiopericardial enlargement. Stable aortic and hilar contours. The patient is  status post CABG. There is diffuse interstitial and airspace opacity with small right pleural effusion. IMPRESSION: CHF pattern. Electronically Signed   By: Monte Fantasia M.D.   On: 10/01/2015 16:15    EKG: Independently reviewed. Sinus rhythm, anterolateral ST/t depression/inversion, nonspecific intraventricular conduction delay.  Assessment/Plan Present on Admission:  **None**  Acute hypoxic respiratory failure: likely from chf exacerbation. S/p lasix 80mg  iv in the ED, patient sats dropped to the 80's on 6liter, now on NRB, admit to stepdown.  Combined diastolic and systolic CHF exacerbation: s/p lasix iv 80 in the ED, insert foley, will give additional dose of lasix 80 iv this evening, then change to lasix iv 40bid, consider cpap tonight if not improving, no acute EKG changes, first set of troponin at baseline, continue cycle troponin, bnp elevated from baseline, echo pending, patient denies chest pain, cardiology consulted.   Hyponatremia: in the setting of volume overload, will continue treat chf, repeat bmp in am.  CAD s/p CABG: last cath in 2010. Currently denies chest pain, continue asa/plavix/betablocker/statin/zetia  HTN: on higher dose of lasix, continue coreg, hold norvasc ( reported allergy?)  Diet controlled diabetes, most recent a1c 6.3 a month ago, continue diet control.    DVT prophylaxis: lovenox  Consultants: cardiology consulted, i have talked to Dr. Percival Spanish who graciously accepted the consult.  Code Status: full   Family Communication:  Patient   Disposition Plan: admit to stepdown  Time spent: 33mins  Odessa Nishi MD, PhD [facility] Pager [phone] If 7PM-7AM, please contact night-coverage at [email or web address], password [facility]

## 2015-10-01 NOTE — Consult Note (Signed)
CARDIOLOGY CONSULT NOTE  Patient ID: Penny James MRN: [id number] DOB/AGE: 1950/12/06 64 y.o.  Admit date: 10/01/2015 Primary Physician Lucretia Kern., DO Primary Cardiologist Dr. Radford Pax Chief Complaint  Dyspnea  HPI:  The patient has a history of acute on chronic diastolic HF.  She has a history of CAD/CABG.  She was last admittedin November with acute SOB.    She reports that she started to get SOB last night and this worsened until she finally presented to the ED.  She was hypoxemic on room air without acute EKG changes.  She denies any chest, neck or arm pain.  She does report that she has had one day of increased ankle swelling.  She says that she does watch her salt and weighs herself daily.   In the ED BNP was elevated at 1768.  Troponin demonstrates a flat non diagnostic slight elevation.  Of note her weight seems to be up about 3 - 4 kg from her discharge weight. In the ED she was treated with solumedrol and Lasix.    Last cardiac cath was 2010 with patent LIMA to the LAD, SVG to diag, SVG to IM occluded, SVG to OM and PDA with 70% and 80% stenosis.  She was noted to have diffuse distal LAD stenosis and the diagonal.  She was managed medically.   Past Medical History  Diagnosis Date  . Hyperlipidemia   . Hypertension   . Arthritis   . Anemia   . Diastolic HF (heart failure) ([place])     EF 55% by cath 2010  . Bipolar affective disorder ([place])   . GERD (gastroesophageal reflux disease)   . Obesity   . Peripheral neuropathy ([place])   . 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 Coronary atherosclerosis     3 vessel s/p CABG - cath 2005 with widely patent grafts and distal disease past the grafts, cath 2010 with occluded SVG to IM and SVG to OM/PDA with PTCA of left circ and PCI of SVG to OM/PDA  . Carotid artery stenosis     60-79%  bilateral ICA stenosis s/p Right CEA 2013  followed by Dr. Donnetta Hutching    Past Surgical History  Procedure Laterality Date  . Abdominal hysterectomy  1984  . Sp pta renal / visc  artery  2003    with stent of left renal artery  . Tonsillectomy      AS A CHILD  . Endarterectomy  02/18/2012    rocedure: ENDARTERECTOMY CAROTID;  Surgeon: Rosetta Posner, MD;  Location: [facility] OR;  Service: Vascular;  Laterality: Right;  Right Carotid endarterectomy with Dacron patch angioplasty with resection of internal carotid artery  . Coronary artery bypass graft      3 vessel coronary disease S?P CABG with widely patent grafts by cath in 2005 and distal disease past the graft insertion sites.  . Cardiac catheterization  03/25/09    Occluded SVG to intermediate and SVG to OM/posterior descending artery with PTCA left circ 6/10 s/p PTCA stent in SVG to OM/PDA, 07/2002 s/p PCI of left circ 03/2009    Allergies  Allergen Reactions  . Penicillins Rash    Has patient had a PCN reaction causing immediate rash, facial/tongue/throat swelling, SOB or lightheadedness with hypotension: Yes Has patient had a PCN reaction causing severe rash involving mucus membranes or  skin necrosis: Yes Has patient had a PCN reaction that required hospitalization Yes- admitted to hospital Has patient had a PCN reaction occurring within the last 10 years: No, childhood allergy If all of the above answers are "NO", then may proceed with Cephalosporin use.   . Protamine     Internal bleeding around heart  . Amlodipine Swelling    Causes swelling in higher dosage   Prescriptions prior to admission  Medication Sig Dispense Refill Last Dose  . aspirin EC 81 MG tablet Take 81 mg by mouth daily.   10/01/2015 at Unknown time  . COREG 25 MG tablet TAKE 1 TABLET TWICE A DAY WITH A MEAL 180 tablet 3 10/01/2015 at 0630  . CRESTOR 40 MG tablet TAKE 1 TABLET BY MOUTH EVERY DAY 30 tablet 11 09/30/2015 at Unknown time  . ferrous sulfate 325 (65 FE) MG tablet  Take 325 mg by mouth daily with breakfast.   10/01/2015 at Unknown time  . furosemide (LASIX) 40 MG tablet Take 1 tablet (40 mg total) by mouth daily. 90 tablet 3 10/01/2015 at Unknown time  . Multiple Vitamins-Minerals (MULTIVITAL) tablet Take 1 tablet by mouth daily.    10/01/2015 at Unknown time  . NORVASC 5 MG tablet Take 1 tablet (5 mg total) by mouth daily. 30 tablet 11 10/01/2015 at Unknown time  . olopatadine (PATANOL) 0.1 % ophthalmic solution Place 1 drop into both eyes 2 (two) times daily. 5 mL 1 10/01/2015 at Unknown time  . PLAVIX 75 MG tablet TAKE 1 TABLET BY MOUTH EVERY DAY 90 tablet 0 10/01/2015 at Unknown time  . PREVACID 15 MG capsule TAKE ONE CAPSULE EVERY DAY 90 capsule 0 10/01/2015 at Unknown time  . Red Yeast Rice Extract (RED YEAST RICE PO) Take 2 each by mouth once.   09/30/2015 at Unknown time  . ZETIA 10 MG tablet TAKE 1 TABLET BY MOUTH EVERY DAY 90 tablet 0 09/30/2015 at Unknown time  . BAYER MICROLET LANCETS lancets Use as instructed 100 each 3 Taking  . Glucose Blood (BAYER BREEZE 2 TEST) DISK Test blood sugar four times a day 200 each 5 Taking  . NORVASC 5 MG tablet TAKE 1 TABLET BY MOUTH EVERY DAY (Patient not taking: Reported on 10/01/2015) 90 tablet 2    Family History  Problem Relation Age of Onset  . Heart disease Brother     Heart Disease before age 24  . Glaucoma Father   . Hypertension Father   . Stroke Paternal Grandmother   . Coronary artery disease Paternal Grandmother   . Cerebral aneurysm Brother   . Clotting disorder Sister     Social History   Social History  . Marital Status: Married    Spouse Name: N/A  . Number of Children: 3  . Years of Education: N/A   Occupational History  . DISABLED    Social History Main Topics  . Smoking status: Never Smoker   . Smokeless tobacco: Never Used  . Alcohol Use: No  . Drug Use: No  . Sexual Activity: No   Other Topics Concern  . Not on file   Social History Narrative     ROS:  Constipation.   Otherwise as stated in the HPI and negative for all other systems.  Physical Exam: Blood pressure 173/88, pulse 77, temperature 97.6 F (36.4 C), temperature source Oral, resp. rate 29, height 5\' 4"  (1.626 m), weight 164 lb 3.9 oz (74.5 kg), SpO2 100 %.  GENERAL:  Acutely ill  appearing HEENT:  Pupils equal round and reactive, fundi not visualized, oral mucosa unremarkable NECK:  No jugular venous distention, waveform within normal limits, carotid upstroke brisk and symmetric, no bruits, no thyromegaly LYMPHATICS:  No cervical, inguinal adenopathy LUNGS:  Diffuse crackles BACK:  No CVA tenderness CHEST:  Unremarkable HEART:  PMI not displaced or sustained,S1 and S2 within normal limits, no S3, no S4, no clicks, no rubs, no murmurs ABD:  Flat, positive bowel sounds normal in frequency in pitch, no bruits, no rebound, no guarding, no midline pulsatile mass, no hepatomegaly, no splenomegaly EXT:  2 plus pulses throughout, mild/moderate edema, no cyanosis no clubbing SKIN:  No rashes no nodules NEURO:  Cranial nerves II through XII grossly intact, motor grossly intact throughout PSYCH:  Cognitively intact, oriented to person place and time   Labs: Lab Results  Component Value Date   BUN 23* 10/01/2015   Lab Results  Component Value Date   CREATININE 1.08* 10/01/2015   Lab Results  Component Value Date   NA 130* 10/01/2015   K 4.0 10/01/2015   CL 96* 10/01/2015   CO2 26 10/01/2015   Lab Results  Component Value Date   TROPONINI 0.16* 09/10/2015   Lab Results  Component Value Date   WBC 3.5* 10/01/2015   HGB 9.9* 10/01/2015   HCT 28.6* 10/01/2015   MCV 83.9 10/01/2015   PLT 143* 10/01/2015   Lab Results  Component Value Date   CHOL 96* 09/21/2015   HDL 32* 09/21/2015   LDLCALC 54 09/21/2015   TRIG 48 09/21/2015   CHOLHDL 3.0 09/21/2015   Lab Results  Component Value Date   ALT 18 09/21/2015   AST 22 09/21/2015   ALKPHOS 50 09/21/2015   BILITOT 0.6 09/21/2015      Radiology:   CXR: Chronic cardiopericardial enlargement. Stable aortic and hilar contours. The patient is status post CABG. There is diffuse interstitial and airspace opacity with small right pleural effusion.  EKG:NSR, rate 71, axis WNL, slight QT prolongation, non specific ST T wave changes in inferolateral leads.    ASSESSMENT AND PLAN:   ACUTE ON CHRONIC HEART FAILURE:  I agree with IV Lasix and I would suggest 80 mg IV bid.  Check an echo.  At this point I am not suggesting an ischemia work up.  We need to understand whether she is compliant with diet and fluid restriction at home.    HTN:   She seems to have reasonably controlled HTN.  I do not suspect that hypertensive urgency is the primary issue.  However, she likely would benefit from better blood pressure control at home.  For now continue the current therapy.    DYSLIPIDEMIA:   Her LDL was 54 with HDL 32.  Continue current therapy.   CAROTID STENOSIS:  Followed by [facility]  RAS:   History of PTA of left renal artery.  ELEVATED TROPONIN:    Likely demand ischemia.    HYPONATREMIA:  Restrict fluid and follow sodium.  ANEMIA:  Per primary team.  Noted at the time of last admission.   SignedMinus Breeding 10/01/2015, 6:32 PM

## 2015-10-01 NOTE — ED Provider Notes (Signed)
CSN: [id number]     Arrival date & time 10/01/15  1306 History   First MD Initiated Contact with Patient 10/01/15 1358     Chief Complaint  Patient presents with  . Shortness of Breath    HPI   Penny James is an 64 y.o. female with history of CHF, CAD (s/p 3 vessel CABG), HTN, HLD, ICA stenosis who presents to the ED for evaluation of SOB. States she was in her usual state of health until 4 AM this morning when she woke up and felt slightly more short of breath than usual. She states that over the next few hours she became increasingly dyspneic until she called EMS because she felt like she could only take small gasps of air. Also endorses orthopnea and states she cannot tolerate laying down right now due to her shortness of breath. Pt reports she thinks she is having an allergic reaction to red yeast rice which she tried for the first time last night. She denies rash. Denies tongue or lip swelling. Denies dysphagia. Denies chest pain. Of note, pt was recently discharged for CHF exacerbation. She states that since discharge she has felt good with no SOB or DOE. She has cardiology follow-up including echo scheduled for this month. States she has been taking her lasix as prescribed.    Past Medical History  Diagnosis Date  . Hyperlipidemia   . Hypertension   . Arthritis   . Anemia   . Ischemic cardiomyopathy     EF 55% by cath 2010  . Bipolar affective disorder ([place])   . GERD (gastroesophageal reflux disease)   . Obesity   . Peripheral neuropathy ([place])   . Depression   . Osteoarthritis   . 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 Bilateral carotid bruits     60-79% bilateral ICA steosis being followed by Dr Early not stent candidate, may need sx in future  . 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 PVD (peripheral vascular disease) (HCC)     s/p PTA of left renal artery  . Coronary  atherosclerosis     3 vessel s/p CABG - cath 2005 with widely patent grafts and distal disease past the grafts, cath 2010 with occluded SVG to IM and SVG to OM/PDA with PTCA of left circ and PCI of SVG to OM/PDA  . Carotid artery occlusion     60-79% bilateral ICA stenosis s/p Right CEA 2013  followed by Dr. Donnetta Hutching  . Chronic diastolic CHF (congestive heart failure) [facility])    Past Surgical History  Procedure Laterality Date  . Abdominal hysterectomy  1984  . Ptca  2003    with stent of left renal artery  . Tonsillectomy      AS A CHILD  . Endarterectomy  02/18/2012    Procedure: ENDARTERECTOMY CAROTID;  Surgeon: Rosetta Posner, MD;  Location: [facility] OR;  Service: Vascular;  Laterality: Right;  Right Carotid endarterectomy with Dacron patch angioplasty with resection of internal carotid artery  . Coronary artery bypass graft      3 vessel coronary disease S?P CABG with widely patent grafts by cath in 2005 and distal disease past the graft insertion sites.  . Cardiac catheterization  03/25/09    Occluded SVG to intermediate and SVG to OM/posterior descending artery with PTCA left circ 6/10 s/p PTCA stent in SVG to OM/PDA, 07/2002 s/p PCI of  left circ 03/2009  . Carotid endarterectomy  02/18/12    Right CEA   Family History  Problem Relation Age of Onset  . Heart disease Brother     Heart Disease before age 8  . Glaucoma Father   . Hypertension Father   . Stroke Paternal Grandmother   . Coronary artery disease Paternal Grandmother   . Cerebral aneurysm Brother   . Clotting disorder Sister    Social History  Substance Use Topics  . Smoking status: Never Smoker   . Smokeless tobacco: Never Used  . Alcohol Use: No   OB History    No data available     Review of Systems  All other systems reviewed and are negative.     Allergies  Penicillins; Protamine; and Amlodipine  Home Medications   Prior to Admission medications   Medication Sig Start Date End Date Taking? Authorizing  Provider  aspirin EC 81 MG tablet Take 81 mg by mouth daily.   Yes Historical Provider, MD  COREG 25 MG tablet TAKE 1 TABLET TWICE A DAY WITH A MEAL 09/26/15  Yes Traci R Turner, MD  CRESTOR 40 MG tablet TAKE 1 TABLET BY MOUTH EVERY DAY 09/14/15  Yes Sueanne Margarita, MD  ferrous sulfate 325 (65 FE) MG tablet Take 325 mg by mouth daily with breakfast.   Yes Historical Provider, MD  furosemide (LASIX) 40 MG tablet Take 1 tablet (40 mg total) by mouth daily. 09/27/15  Yes Sueanne Margarita, MD  Multiple Vitamins-Minerals (MULTIVITAL) tablet Take 1 tablet by mouth daily.    Yes Historical Provider, MD  NORVASC 5 MG tablet Take 1 tablet (5 mg total) by mouth daily. 09/21/15  Yes Sueanne Margarita, MD  olopatadine (PATANOL) 0.1 % ophthalmic solution Place 1 drop into both eyes 2 (two) times daily. 08/25/15  Yes Lucretia Kern, DO  PLAVIX 75 MG tablet TAKE 1 TABLET BY MOUTH EVERY DAY 09/19/15  Yes Sueanne Margarita, MD  PREVACID 15 MG capsule TAKE ONE CAPSULE EVERY DAY 07/22/15  Yes Lucretia Kern, DO  Red Yeast Rice Extract (RED YEAST RICE PO) Take 2 each by mouth once.   Yes Historical Provider, MD  ZETIA 10 MG tablet TAKE 1 TABLET BY MOUTH EVERY DAY 07/06/15  Yes Sueanne Margarita, MD  BAYER MICROLET LANCETS lancets Use as instructed 08/19/15   Lucretia Kern, DO  Glucose Blood (BAYER BREEZE 2 TEST) DISK Test blood sugar four times a day 08/19/15   Lucretia Kern, DO  NORVASC 5 MG tablet TAKE 1 TABLET BY MOUTH EVERY DAY Patient not taking: Reported on 10/01/2015 09/24/15   Lucretia Kern, DO   BP 143/67 mmHg  Pulse 69  Temp(Src) 97.8 F (36.6 C) (Temporal)  Resp 22  SpO2 95% Physical Exam  Constitutional: She is oriented to person, place, and time.  HENT:  Head: Atraumatic.  Right Ear: External ear normal.  Left Ear: External ear normal.  Nose: Nose normal.  Mouth/Throat: Uvula is midline, oropharynx is clear and moist and mucous membranes are normal. No trismus in the jaw. No uvula swelling. No posterior  oropharyngeal edema.  Eyes: Conjunctivae and EOM are normal. Pupils are equal, round, and reactive to light.  Neck: Normal range of motion. Neck supple.  Cardiovascular: Normal rate, regular rhythm, normal heart sounds and intact distal pulses.   Pulmonary/Chest: No accessory muscle usage. Tachypnea noted. She exhibits no tenderness.  Small gasps of air, has to pause mid sentence  to take a breath. Bilateral rhonchi and congestive lung sounds. No wheezes appreciable.   Abdominal: Soft. Bowel sounds are normal. She exhibits no distension. There is no tenderness.  Musculoskeletal: Normal range of motion. She exhibits no edema or tenderness.  Neurological: She is alert and oriented to person, place, and time.  Skin: Skin is warm. No rash noted. She is diaphoretic. There is pallor.    ED Course  Procedures (including critical care time) Labs Review Labs Reviewed  BASIC METABOLIC PANEL - Abnormal; Notable for the following:    Sodium 130 (*)    Chloride 96 (*)    Glucose, Bld 113 (*)    BUN 23 (*)    Creatinine, Ser 1.08 (*)    GFR calc non Af Amer 52 (*)    All other components within normal limits  CBC - Abnormal; Notable for the following:    WBC 3.5 (*)    RBC 3.41 (*)    Hemoglobin 9.9 (*)    HCT 28.6 (*)    Platelets 143 (*)    All other components within normal limits  BRAIN NATRIURETIC PEPTIDE - Abnormal; Notable for the following:    B Natriuretic Peptide 1768.2 (*)    All other components within normal limits  COMPREHENSIVE METABOLIC PANEL - Abnormal; Notable for the following:    Sodium 130 (*)    Chloride 97 (*)    Glucose, Bld 180 (*)    BUN 25 (*)    Calcium 8.8 (*)    All other components within normal limits  CBC - Abnormal; Notable for the following:    RBC 3.79 (*)    Hemoglobin 10.9 (*)    HCT 32.1 (*)    All other components within normal limits  MRSA PCR SCREENING  URINALYSIS, ROUTINE W REFLEX MICROSCOPIC (NOT AT [facility])  TROPONIN I  TROPONIN I  BRAIN  NATRIURETIC PEPTIDE  CBC  PROTIME-INR  TROPONIN I  I-STAT TROPOININ, ED    Imaging Review Dg Chest 2 View  10/01/2015  CLINICAL DATA:  Shortness of breath EXAM: CHEST  2 VIEW COMPARISON:  09/09/2015 FINDINGS: Chronic cardiopericardial enlargement. Stable aortic and hilar contours. The patient is status post CABG. There is diffuse interstitial and airspace opacity with small right pleural effusion. IMPRESSION: CHF pattern. Electronically Signed   By: Monte Fantasia M.D.   On: 10/01/2015 16:15   I have personally reviewed and evaluated these images and lab results as part of my medical decision-making.   EKG Interpretation None      MDM   Final diagnoses:  Acute on chronic congestive heart failure, unspecified congestive heart failure type (HCC)    Allergic rxn vs chf exacerbation, though i think chf exacerbation is more likely. [place] 1.5, doubt this is PE. Will give solu-medrol, benadryl, and pepcid for possible allergic reaction. Will check cbc, bmp, bnp, CXR, troponin, ekg. Will give IV lasix 40mg . She maintaints SpO2 93% on room air but subjectively feels better with 4L O2 via [place].   Pt reports improvement in breathing with meds as above. She is less tachypneic with RR in the low 20s. Able to speak in full sentences. Maintaining SpO2 96% on 4L O2. BNP 1768. 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 Labs otherwise close to her baseline. CXR still pending.  CXR shows CHF pattern. As I was discussing with pt she started getting very anxious which in turn made her more short of breath.  Tachypneic to the low 30s. I was able to calm her and slow her RR and she was able to breathe comfortably at an appropriate rate again with SpO2 94-95%. She continues to feel anxious. I will give her a small dose of ativan. Given her increased work of breathing, O2 requirement, and lab findings, will call hospitalist for admission  for CHF exacerbation.      Anne Ng, PA-C 10/01/15 2057  Carmin Muskrat, MD 10/02/15 [phone]

## 2015-10-01 NOTE — ED Notes (Signed)
Penny James [phone] Son wants to be called with status

## 2015-10-01 NOTE — ED Notes (Signed)
Pt was assisted to BR with wheelchair. Pt O2 sats were noted to be 77% on 5 L. Pt was placed on NRB and is now 95%. Hospitalist paged

## 2015-10-02 ENCOUNTER — Inpatient Hospital Stay (HOSPITAL_COMMUNITY): Payer: Medicare Other

## 2015-10-02 DIAGNOSIS — I509 Heart failure, unspecified: Secondary | ICD-10-CM

## 2015-10-02 DIAGNOSIS — I110 Hypertensive heart disease with heart failure: Secondary | ICD-10-CM

## 2015-10-02 DIAGNOSIS — I739 Peripheral vascular disease, unspecified: Secondary | ICD-10-CM

## 2015-10-02 DIAGNOSIS — R7989 Other specified abnormal findings of blood chemistry: Secondary | ICD-10-CM

## 2015-10-02 LAB — CBC
HCT: 33.8 % — ABNORMAL LOW (ref 36.0–46.0)
Hemoglobin: 11.5 g/dL — ABNORMAL LOW (ref 12.0–15.0)
MCH: 28.6 pg (ref 26.0–34.0)
MCHC: 34 g/dL (ref 30.0–36.0)
MCV: 84.1 fL (ref 78.0–100.0)
PLATELETS: 151 10*3/uL (ref 150–400)
RBC: 4.02 MIL/uL (ref 3.87–5.11)
RDW: 13.8 % (ref 11.5–15.5)
WBC: 9.6 10*3/uL (ref 4.0–10.5)

## 2015-10-02 LAB — BASIC METABOLIC PANEL
ANION GAP: 12 (ref 5–15)
BUN: 29 mg/dL — AB (ref 6–20)
CO2: 22 mmol/L (ref 22–32)
Calcium: 9.2 mg/dL (ref 8.9–10.3)
Chloride: 99 mmol/L — ABNORMAL LOW (ref 101–111)
Creatinine, Ser: 1.1 mg/dL — ABNORMAL HIGH (ref 0.44–1.00)
GFR calc Af Amer: 59 mL/min — ABNORMAL LOW (ref >60)
GFR, EST NON AFRICAN AMERICAN: 51 mL/min — AB (ref >60)
Glucose, Bld: 149 mg/dL — ABNORMAL HIGH (ref 65–99)
POTASSIUM: 4.1 mmol/L (ref 3.5–5.1)
SODIUM: 133 mmol/L — AB (ref 135–145)

## 2015-10-02 LAB — BLOOD GAS, ARTERIAL
Acid-base deficit: 2.5 mmol/L — ABNORMAL HIGH (ref 0.0–2.0)
Bicarbonate: 21.9 mEq/L (ref 20.0–24.0)
DELIVERY SYSTEMS: POSITIVE
DRAWN BY: 422461
Expiratory PAP: 5
FIO2: 0.6
Inspiratory PAP: 16
Mode: POSITIVE
O2 Saturation: 99.4 %
PO2 ART: 188 mmHg — AB (ref 80.0–100.0)
Patient temperature: 97.7
RATE: 22 resp/min
TCO2: 20.2 mmol/L (ref 0–100)
pCO2 arterial: 37.6 mmHg (ref 35.0–45.0)
pH, Arterial: 7.38 (ref 7.350–7.450)

## 2015-10-02 LAB — TROPONIN I
TROPONIN I: 1.95 ng/mL — AB (ref <0.031)
TROPONIN I: 4.84 ng/mL — AB (ref <0.031)
Troponin I: <0.03 ng/mL (ref <0.031)
Troponin I: 0.03 ng/mL (ref <0.031)
Troponin I: 3.98 ng/mL (ref <0.031)

## 2015-10-02 LAB — GLUCOSE, CAPILLARY: Glucose-Capillary: 185 mg/dL — ABNORMAL HIGH (ref 65–99)

## 2015-10-02 LAB — PROTIME-INR
INR: 1.22 (ref 0.00–1.49)
Prothrombin Time: 15.6 seconds — ABNORMAL HIGH (ref 11.6–15.2)

## 2015-10-02 LAB — HEPARIN LEVEL (UNFRACTIONATED): HEPARIN UNFRACTIONATED: 0.8 [IU]/mL — AB (ref 0.30–0.70)

## 2015-10-02 LAB — BRAIN NATRIURETIC PEPTIDE: B NATRIURETIC PEPTIDE 5: 2267 pg/mL — AB (ref 0.0–100.0)

## 2015-10-02 MED ORDER — FUROSEMIDE 10 MG/ML IJ SOLN
80.0000 mg | Freq: Two times a day (BID) | INTRAMUSCULAR | Status: AC
  Administered 2015-10-02 – 2015-10-04 (×6): 80 mg via INTRAVENOUS
  Filled 2015-10-02 (×8): qty 8

## 2015-10-02 MED ORDER — HYDRALAZINE HCL 25 MG PO TABS: 25.0000 mg | ORAL_TABLET | Freq: Three times a day (TID) | ORAL | Status: DC

## 2015-10-02 MED ORDER — CHLORHEXIDINE GLUCONATE 0.12 % MT SOLN
15.0000 mL | Freq: Two times a day (BID) | OROMUCOSAL | Status: DC
  Administered 2015-10-02 – 2015-10-08 (×12): 15 mL via OROMUCOSAL
  Filled 2015-10-02 (×9): qty 15

## 2015-10-02 MED ORDER — ISOSORBIDE MONONITRATE ER 60 MG PO TB24
30.0000 mg | ORAL_TABLET | Freq: Every day | ORAL | Status: DC
  Administered 2015-10-02 – 2015-10-03 (×2): 30 mg via ORAL
  Filled 2015-10-02 (×2): qty 1

## 2015-10-02 MED ORDER — HEPARIN BOLUS VIA INFUSION
4000.0000 [IU] | Freq: Once | INTRAVENOUS | Status: AC
  Administered 2015-10-02: 4000 [IU] via INTRAVENOUS
  Filled 2015-10-02: qty 4000

## 2015-10-02 MED ORDER — HYDRALAZINE HCL 20 MG/ML IJ SOLN: 5.0000 mg | INTRAMUSCULAR | Status: DC | PRN

## 2015-10-02 MED ORDER — CETYLPYRIDINIUM CHLORIDE 0.05 % MT LIQD
7.0000 mL | Freq: Two times a day (BID) | OROMUCOSAL | Status: DC
  Administered 2015-10-02 – 2015-10-07 (×6): 7 mL via OROMUCOSAL

## 2015-10-02 MED ORDER — HEPARIN (PORCINE) IN NACL 100-0.45 UNIT/ML-% IJ SOLN
850.0000 [IU]/h | INTRAMUSCULAR | Status: DC
  Administered 2015-10-02: 850 [IU]/h via INTRAVENOUS
  Filled 2015-10-02: qty 250

## 2015-10-02 NOTE — Progress Notes (Signed)
CRITICAL VALUE ALERT  Critical value received:  Troponin 1.95  Date of notification:  10/02/2015  Time of notification: [id number]  Critical value read back:Yes.    Nurse who received alert: Javier Glazier  MD notified (1st page):  Xu notified at 1407 said to call cardiology:Allred  Time of first page:  1427  MD notified (2nd page):  Time of second page:  Responding MD:  Allred  Time MD responded:  1427

## 2015-10-02 NOTE — Progress Notes (Addendum)
SUBJECTIVE: The patient is quite ill.  Remains SOB.  Also reports substernal chest discomfort with breathing.  Improved overnight with Bipap per nursing team.  Diuresing better.  [name] antiseptic oral rinse  7 mL Mouth Rinse q12n4p  . aspirin EC  81 mg Oral Daily  . carvedilol  12.5 mg Oral BID WC  . chlorhexidine  15 mL Mouth Rinse BID  . clopidogrel  75 mg Oral Daily  . enoxaparin (LOVENOX) injection  40 mg Subcutaneous Q24H  . ezetimibe  10 mg Oral Daily  . ferrous sulfate  325 mg Oral Q breakfast  . furosemide  80 mg Intravenous BID  . olopatadine  1 drop Both Eyes BID  . pantoprazole  20 mg Oral Daily  . rosuvastatin  40 mg Oral q1800      OBJECTIVE: Physical Exam: Filed Vitals:   10/02/15 0500 10/02/15 0600 10/02/15 0700 10/02/15 0800  BP: 176/69 170/62 157/60   Pulse: 81 83 87   Temp:    99.2 F (37.3 C)  TempSrc:    Oral  Resp: 22 25 23    Height:      Weight: 161 lb 13.1 oz (73.4 kg)     SpO2: 100%  99%     Intake/Output Summary (Last 24 hours) at 10/02/15 0830 Last data filed at 10/02/15 0430  Gross per 24 hour  Intake    120 ml  Output   2050 ml  Net  -1930 ml    Telemetry reveals sinus rhythm  GEN- The patient is overweight and ill appearing, alert and oriented x 3 today.   Head- normocephalic, atraumatic Eyes-  Sclera clear, conjunctiva pink Ears- hearing intact Oropharynx- clear Neck- supple, + JVD Lungs- bibasilar rales, normal work of breathing Heart- Regular rate and rhythm  GI- soft, NT, ND, + BS Extremities- no clubbing, cyanosis, + dependant edema Skin- no rash or lesion Psych- euthymic mood, full affect Neuro- strength and sensation are intact  LABS: Basic Metabolic Panel:  Recent Labs  10/01/15 1815 10/02/15 0130  NA 130* 133*  K 4.0 4.1  CL 97* 99*  CO2 24 22  GLUCOSE 180* 149*  BUN 25* 29*  CREATININE 0.86 1.10*  CALCIUM 8.8* 9.2   Liver Function Tests:  Recent Labs  10/01/15 1815  AST 34  ALT 37  ALKPHOS 58    BILITOT 1.0  PROT 7.2  ALBUMIN 4.3   No results for input(s): LIPASE, AMYLASE in the last 72 hours. CBC:  Recent Labs  10/01/15 1815 10/02/15 0110  WBC 10.5 9.6  HGB 10.9* 11.5*  HCT 32.1* 33.8*  MCV 84.7 84.1  PLT 185 151   Cardiac Enzymes:  Recent Labs  10/02/15 0110 10/02/15 0653  TROPONINI <0.03 0.03    RADIOLOGY: Dg Chest 2 View  10/01/2015  CLINICAL DATA:  Shortness of breath EXAM: CHEST  2 VIEW COMPARISON:  09/09/2015 FINDINGS: Chronic cardiopericardial enlargement. Stable aortic and hilar contours. The patient is status post CABG. There is diffuse interstitial and airspace opacity with small right pleural effusion. IMPRESSION: CHF pattern. Electronically Signed   By: Monte Fantasia M.D.   On: 10/01/2015 16:15   Dg Chest 2 View  09/09/2015  CLINICAL DATA:  64 year old female with cough, congestion and shortness of breath for the past 3 weeks EXAM: CHEST  2 VIEW COMPARISON:  Prior chest x-ray 10/14/2013 FINDINGS: Stable cardiomegaly with left heart enlargement. Patient is status post median sternotomy with evidence of multivessel CABG including LIMA bypass. Atherosclerotic calcifications are  present in the transverse aorta. No pulmonary edema, focal airspace consolidation or pneumothorax. There are small bilateral pleural effusions. Right pleural effusion is similar compared to prior while the left is slightly more prominent. No suspicious pulmonary nodule or mass. No acute osseous abnormality. IMPRESSION: 1. Small bilateral pleural effusions. 2. Stable cardiomegaly with left heart enlargement. No evidence of pulmonary edema. 3. Surgical changes of prior multivessel CABG. 4. Aortic atherosclerosis. Electronically Signed   By: Jacqulynn Cadet M.D.   On: 09/09/2015 09:39    ASSESSMENT AND PLAN:   1. ACUTE ON CHRONIC HEART FAILURE: No recent echos to review Echo is currently pending CXR from today reveals pulmonary edema and she remains wet on exam Continue lasix  80mg  IV BID Strict Is and Os and daily weights Would keep in ICU for now.  2. [id number] goal Given prior RAS, will hold off on ace inhibitor for now.  Will see what echo shows.  Consider spironolactone.  For now will add imdur/ hydralazine for afterload reduction. Hopefully BP will improve with diuresis/ clinical improvement  3. DYSLIPIDEMIA: Her LDL was 54 with HDL 32. Continue current therapy.   4. CAROTID STENOSIS: Followed by [facility]  5. RAS: History of PTA of left renal artery.  6. ELEVATED TROPONIN: Likely demand ischemia. will follow closely.    7. HYPONATREMIA: Restrict fluid and follow sodium.  8. ANEMIA: Per primary team. Noted at the time of last admission  Pt is clinically quite ill.  She is at risk for further decompensation.  A high level of decision making was required for this encounter.  Thompson Grayer, MD 10/02/2015 8:30 AM

## 2015-10-02 NOTE — Progress Notes (Signed)
RN placed PT back on BiPAP- RT was with a critical PT at that time.

## 2015-10-02 NOTE — Progress Notes (Addendum)
Robust elevation in CMs noted Per nursing staff, patient is currently chest pain free.  Possibly demand ischemia still.  Repeat EKG at this time. Will start on heparin drip for now and follow closely.  Thompson Grayer MD, [facility] 10/02/2015 2:38 PM  Addendum:     ekg reviewed. Incomplete LBBB similar to prior ekgs  Thompson Grayer MD, [facility] 10/02/2015 3:52 PM

## 2015-10-02 NOTE — Progress Notes (Signed)
Pt stable with vital signs as noted.  Bilateral breath sounds were a few scattered fine crackles.  No BiPAP indicated at this time.
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
## 2015-10-02 NOTE — Progress Notes (Signed)
RT entered PT room- RN had just removed PT from BiPAP and placed on [place]- PT does not appear to be in respiratory distress at this time.

## 2015-10-02 NOTE — Progress Notes (Signed)
PROGRESS NOTE  Penny James [id number] DOB: [birth year] DOA: 10/01/2015 PCP: Lucretia Kern., DO  HPI/Recap of past 24 hours:  Was put on bipap last night due to hypoxia, abg showed co2 retention, still tachypnea, edema, bilateral crackles, remain on bipap, keep npo  Assessment/Plan: Active Problems:   Acute on chronic congestive heart failure (HCC)   CHF (congestive heart failure) (HCC)   Congestive heart failure (CHF) ([place])  Acute hypercapenic and hypoxic respiratory failure:  likely from chf exacerbation. Initial abg with ph 7.19 co62 p/f ration of 153.  Started on bipap, abg parameters improving, but still significant tachypnic, diffuse crackles, continue diuresis/bipap/keep in stepdown  Combined diastolic and systolic CHF exacerbation:  12/3-s/p lasix iv 80 in the ED, insert foley, additional dose of lasix 80 iv this evening, no acute EKG changes, first set of troponin at baseline, continue cycle troponin, bnp elevated from baseline, echo pending, patient denies chest pain, cardiology consulted.  12/4- was started on bipap last night, continue as above  Hyponatremia:ns 130 on admission, in the setting of volume overload, continue treat chf, repeat bmp in am ns  133.  Elevation of cr:  from cardiorenal syndrome? From diuresis? Close monitor cr.  CAD s/p CABG: last cath in 2010. Currently denies chest pain, continue asa/plavix/betablocker/statin/zetia  HTN: on higher dose of lasix, continue coreg, hold norvasc ( reported allergy?), prn hydralazine, cardiology recommended adding imdur.  Diet controlled diabetes, most recent a1c 6.3 a month ago, continue diet control.  H/o renal artery stenosis s/p PTA of the left renal artery, avoid acei and arb.  H/o carotid artery stenosis s/o right CEA in 2013  DVT prophylaxis: lovenox  Code Status: full  Family Communication: patient   Disposition Plan: remain in  stepdown   Consultants:  cardiology  Procedures:  bipap  Antibiotics:  none   Objective: BP 157/60 mmHg  Pulse 87  Temp(Src) 99.3 F (37.4 C) (Axillary)  Resp 23  Ht 5\' 4"  (1.626 m)  Wt 161 lb 13.1 oz (73.4 kg)  BMI 27.76 kg/m2  SpO2 99%  Intake/Output Summary (Last 24 hours) at 10/02/15 0749 Last data filed at 10/02/15 0430  Gross per 24 hour  Intake    120 ml  Output   2050 ml  Net  -1930 ml   Filed Weights   10/01/15 1822 10/02/15 0500  Weight: 164 lb 3.9 oz (74.5 kg) 161 lb 13.1 oz (73.4 kg)    Exam:   General:  In moderate distress due to sob  Cardiovascular: RRR  Respiratory: diffuse bilateral crackles  Abdomen: Soft/ND/NT, positive BS  Musculoskeletal: bilateral pedal  Edema  Neuro: aaox3, no focal deficit  Data Reviewed: Basic Metabolic Panel:  Recent Labs Lab 10/01/15 1323 10/01/15 1815 10/02/15 0130  NA 130* 130* 133*  K 4.0 4.0 4.1  CL 96* 97* 99*  CO2 26 24 22   GLUCOSE 113* 180* 149*  BUN 23* 25* 29*  CREATININE 1.08* 0.86 1.10*  CALCIUM 8.9 8.8* 9.2   Liver Function Tests:  Recent Labs Lab 10/01/15 1815  AST 34  ALT 37  ALKPHOS 58  BILITOT 1.0  PROT 7.2  ALBUMIN 4.3   No results for input(s): LIPASE, AMYLASE in the last 168 hours. No results for input(s): AMMONIA in the last 168 hours. CBC:  Recent Labs Lab 10/01/15 1323 10/01/15 1815 10/02/15 0110  WBC 3.5* 10.5 9.6  HGB 9.9* 10.9* 11.5*  HCT 28.6* 32.1* 33.8*  MCV 83.9 84.7 84.1  PLT 143* 185 151  Cardiac Enzymes:    Recent Labs Lab 10/02/15 0110 10/02/15 0653  TROPONINI <0.03 0.03   BNP (last 3 results)  Recent Labs  09/09/15 0920 10/01/15 1324 10/02/15 0110  BNP 1193.4* 1768.2* 2267.0*    ProBNP (last 3 results)  Recent Labs  04/01/15 0738  PROBNP 460.0*    CBG: No results for input(s): GLUCAP in the last 168 hours.  Recent Results (from the past 240 hour(s))  MRSA PCR Screening     Status: None   Collection Time:  10/01/15  6:05 PM  Result Value Ref Range Status   MRSA by PCR NEGATIVE NEGATIVE Final    Comment:        The GeneXpert MRSA Assay (FDA approved for NASAL specimens only), is one component of a comprehensive MRSA colonization surveillance program. It is not intended to diagnose MRSA infection nor to guide or monitor treatment for MRSA infections.      Studies: Dg Chest 2 View  10/01/2015  CLINICAL DATA:  Shortness of breath EXAM: CHEST  2 VIEW COMPARISON:  09/09/2015 FINDINGS: Chronic cardiopericardial enlargement. Stable aortic and hilar contours. The patient is status post CABG. There is diffuse interstitial and airspace opacity with small right pleural effusion. IMPRESSION: CHF pattern. Electronically Signed   By: Monte Fantasia M.D.   On: 10/01/2015 16:15    Scheduled Meds: . antiseptic oral rinse  7 mL Mouth Rinse q12n4p  . aspirin EC  81 mg Oral Daily  . carvedilol  12.5 mg Oral BID WC  . chlorhexidine  15 mL Mouth Rinse BID  . clopidogrel  75 mg Oral Daily  . enoxaparin (LOVENOX) injection  40 mg Subcutaneous Q24H  . ezetimibe  10 mg Oral Daily  . ferrous sulfate  325 mg Oral Q breakfast  . furosemide  80 mg Intravenous BID  . olopatadine  1 drop Both Eyes BID  . pantoprazole  20 mg Oral Daily  . rosuvastatin  40 mg Oral q1800    Continuous Infusions:    Time spent: 81mins  Marytza Grandpre MD, PhD  [facility] Pager [phone]. If 7PM-7AM, please contact night-coverage at [email or web address], password [facility] 10/02/2015, 7:49 AM  LOS: 1 day

## 2015-10-02 NOTE — Progress Notes (Signed)
Utilization review completed.  

## 2015-10-02 NOTE — Progress Notes (Signed)
ANTICOAGULATION CONSULT NOTE - Initial Consult  Pharmacy Consult for heparin Indication: chest pain/ACS  Allergies  Allergen Reactions  . Penicillins Rash    Has patient had a PCN reaction causing immediate rash, facial/tongue/throat swelling, SOB or lightheadedness with hypotension: Yes Has patient had a PCN reaction causing severe rash involving mucus membranes or skin necrosis: Yes Has patient had a PCN reaction that required hospitalization Yes- admitted to hospital Has patient had a PCN reaction occurring within the last 10 years: No, childhood allergy If all of the above answers are "NO", then may proceed with Cephalosporin use.   . Protamine     Internal bleeding around heart  . Amlodipine Swelling    Causes swelling in higher dosage    Patient Measurements: Height: 5\' 4"  (162.6 cm) Weight: 161 lb 13.1 oz (73.4 kg) IBW/kg (Calculated) : 54.7 Heparin Dosing Weight: 70kg  Vital Signs: Temp: 97.4 F (36.3 C) (12/04 1200) Temp Source: Oral (12/04 1200) BP: 122/41 mmHg (12/04 1400) Pulse Rate: 81 (12/04 1400)  Labs:  Recent Labs  10/01/15 1323 10/01/15 1815 10/02/15 0110 10/02/15 0130 10/02/15 0653 10/02/15 1245  HGB 9.9* 10.9* 11.5*  --   --   --   HCT 28.6* 32.1* 33.8*  --   --   --   PLT 143* 185 151  --   --   --   LABPROT  --   --  15.6*  --   --   --   INR  --   --  1.22  --   --   --   CREATININE 1.08* 0.86  --  1.10*  --   --   TROPONINI  --   --  <0.03  --  0.03 1.95*    Estimated Creatinine Clearance: 49.4 mL/min (by C-G formula based on Cr of 1.1).   Medical History: Past Medical History  Diagnosis Date  . Hyperlipidemia   . Hypertension   . Arthritis   . Anemia   . Diastolic HF (heart failure) ([place])     EF 55% by cath 2010  . Bipolar affective disorder ([place])   . GERD (gastroesophageal reflux disease)   . Obesity   . Peripheral neuropathy ([place])   . 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 Coronary atherosclerosis     3 vessel s/p CABG - cath 2005 with widely patent grafts and distal disease past the grafts, cath 2010 with occluded SVG to IM and SVG to OM/PDA with PTCA of left circ and PCI of SVG to OM/PDA  . Carotid artery stenosis     60-79% bilateral ICA stenosis s/p Right CEA 2013  followed by Dr. Donnetta Hutching    Assessment: 50 [place] presenting 12/3 with shortness of breath d/t acute HF exacerbation, c/o chest pain 12/4 with rise in troponin. Has known CAD. Pharmacy asked to dose heparin for rule out NSTEMI vs. Demand ischemia.  Today's labs, 10/02/2015  CBC: Hgb low/stable, pltc at lower limit normal  SCr increased this morning  On enoxaparin 40mg  SQ - last dose 12/3 at 23:00  Goal of Therapy:  Heparin level 0.3-0.7 units/ml Monitor platelets by anticoagulation protocol: Yes   Plan:   Heparin 4000 unit bolus then heparin gtt at 850 units/hr  Check 6h heparin level  Daily heparin level and CBC  Enoxaparin d/c'd  Doreene Eland, PharmD, BCPS.  Pager: [phone] 10/02/2015 2:48 PM

## 2015-10-02 NOTE — Progress Notes (Signed)
CRITICAL VALUE ALERT  Critical value received:  Troponin 3.98  Date of notification:  10/02/2015  Time of notification: 1748  Critical value read back:Yes.    Nurse who received alert:  Javier Glazier, RN  MD notified (1st page):  Alejandro Mulling  Time of first page:  1757  MD notified (2nd page):  Time of second page:  Responding MD:  Alejandro Mulling  Time MD responded:  Carollee Massed

## 2015-10-02 NOTE — Progress Notes (Signed)
PT remains off BiPAP and is conversing with a guest. Does not appear to be in respiratory distress at this time.

## 2015-10-03 ENCOUNTER — Inpatient Hospital Stay (HOSPITAL_COMMUNITY): Payer: Medicare Other

## 2015-10-03 DIAGNOSIS — N189 Chronic kidney disease, unspecified: Secondary | ICD-10-CM

## 2015-10-03 DIAGNOSIS — N289 Disorder of kidney and ureter, unspecified: Secondary | ICD-10-CM

## 2015-10-03 DIAGNOSIS — I214 Non-ST elevation (NSTEMI) myocardial infarction: Secondary | ICD-10-CM

## 2015-10-03 DIAGNOSIS — I509 Heart failure, unspecified: Secondary | ICD-10-CM

## 2015-10-03 DIAGNOSIS — I5043 Acute on chronic combined systolic (congestive) and diastolic (congestive) heart failure: Secondary | ICD-10-CM | POA: Diagnosis present

## 2015-10-03 DIAGNOSIS — I700 Atherosclerosis of aorta: Secondary | ICD-10-CM

## 2015-10-03 LAB — BASIC METABOLIC PANEL
Anion gap: 8 (ref 5–15)
BUN: 33 mg/dL — AB (ref 6–20)
CHLORIDE: 97 mmol/L — AB (ref 101–111)
CO2: 28 mmol/L (ref 22–32)
CREATININE: 1.2 mg/dL — AB (ref 0.44–1.00)
Calcium: 8.6 mg/dL — ABNORMAL LOW (ref 8.9–10.3)
GFR calc Af Amer: 53 mL/min — ABNORMAL LOW (ref >60)
GFR calc non Af Amer: 46 mL/min — ABNORMAL LOW (ref >60)
Glucose, Bld: 162 mg/dL — ABNORMAL HIGH (ref 65–99)
POTASSIUM: 3.5 mmol/L (ref 3.5–5.1)
Sodium: 133 mmol/L — ABNORMAL LOW (ref 135–145)

## 2015-10-03 LAB — TROPONIN I
Troponin I: 4.32 ng/mL (ref <0.031)
Troponin I: 2.46 ng/mL (ref <0.031)
Troponin I: 2.25 ng/mL (ref <0.031)

## 2015-10-03 LAB — CBC
HCT: 28 % — ABNORMAL LOW (ref 36.0–46.0)
Hemoglobin: 9.6 g/dL — ABNORMAL LOW (ref 12.0–15.0)
MCH: 28.8 pg (ref 26.0–34.0)
MCHC: 34.3 g/dL (ref 30.0–36.0)
MCV: 84.1 fL (ref 78.0–100.0)
PLATELETS: 153 10*3/uL (ref 150–400)
RBC: 3.33 MIL/uL — AB (ref 3.87–5.11)
RDW: 14 % (ref 11.5–15.5)
WBC: 9.5 10*3/uL (ref 4.0–10.5)

## 2015-10-03 LAB — GLUCOSE, CAPILLARY
GLUCOSE-CAPILLARY: 148 mg/dL — AB (ref 65–99)
GLUCOSE-CAPILLARY: 197 mg/dL — AB (ref 65–99)
GLUCOSE-CAPILLARY: 205 mg/dL — AB (ref 65–99)
GLUCOSE-CAPILLARY: 151 mg/dL — AB (ref 65–99)
Glucose-Capillary: 143 mg/dL — ABNORMAL HIGH (ref 65–99)

## 2015-10-03 LAB — EXPECTORATED SPUTUM ASSESSMENT W REFEX TO RESP CULTURE

## 2015-10-03 LAB — EXPECTORATED SPUTUM ASSESSMENT W GRAM STAIN, RFLX TO RESP C

## 2015-10-03 LAB — MAGNESIUM: Magnesium: 2.1 mg/dL (ref 1.7–2.4)

## 2015-10-03 LAB — HEPARIN LEVEL (UNFRACTIONATED)
HEPARIN UNFRACTIONATED: 0.66 [IU]/mL (ref 0.30–0.70)
HEPARIN UNFRACTIONATED: 0.61 [IU]/mL (ref 0.30–0.70)

## 2015-10-03 LAB — BRAIN NATRIURETIC PEPTIDE: B NATRIURETIC PEPTIDE 5: 1789 pg/mL — AB (ref 0.0–100.0)

## 2015-10-03 MED ORDER — INSULIN ASPART 100 UNIT/ML ~~LOC~~ SOLN
0.0000 [IU] | Freq: Three times a day (TID) | SUBCUTANEOUS | Status: DC
Start: 2015-10-03 — End: 2015-10-10
  Administered 2015-10-03: 5 [IU] via SUBCUTANEOUS
  Administered 2015-10-03 – 2015-10-04 (×2): 3 [IU] via SUBCUTANEOUS
  Administered 2015-10-05: 2 [IU] via SUBCUTANEOUS
  Administered 2015-10-06 (×2): 3 [IU] via SUBCUTANEOUS
  Administered 2015-10-07 – 2015-10-08 (×4): 2 [IU] via SUBCUTANEOUS
  Administered 2015-10-09: 3 [IU] via SUBCUTANEOUS
  Administered 2015-10-09 – 2015-10-10 (×2): 2 [IU] via SUBCUTANEOUS

## 2015-10-03 MED ORDER — PERFLUTREN LIPID MICROSPHERE
1.0000 mL | INTRAVENOUS | Status: AC | PRN
  Administered 2015-10-03: 2 mL via INTRAVENOUS
  Filled 2015-10-03: qty 10

## 2015-10-03 MED ORDER — HEPARIN (PORCINE) IN NACL 100-0.45 UNIT/ML-% IJ SOLN
750.0000 [IU]/h | INTRAMUSCULAR | Status: DC
  Administered 2015-10-03 – 2015-10-05 (×3): 750 [IU]/h via INTRAVENOUS
  Filled 2015-10-03 (×4): qty 250

## 2015-10-03 MED ORDER — POTASSIUM CHLORIDE CRYS ER 20 MEQ PO TBCR
20.0000 meq | EXTENDED_RELEASE_TABLET | ORAL | Status: AC
  Administered 2015-10-03 (×2): 20 meq via ORAL
  Filled 2015-10-03 (×2): qty 1

## 2015-10-03 MED ORDER — PERFLUTREN LIPID MICROSPHERE
INTRAVENOUS | Status: AC
  Filled 2015-10-03: qty 10

## 2015-10-03 MED ORDER — ENSURE ENLIVE PO LIQD
237.0000 mL | Freq: Two times a day (BID) | ORAL | Status: DC
  Administered 2015-10-03 – 2015-10-09 (×5): 237 mL via ORAL

## 2015-10-03 MED ORDER — NITROGLYCERIN 0.4 MG SL SUBL: 0.4000 mg | SUBLINGUAL_TABLET | SUBLINGUAL | Status: DC | PRN

## 2015-10-03 MED ORDER — NITROGLYCERIN IN D5W 200-5 MCG/ML-% IV SOLN
0.0000 ug/min | INTRAVENOUS | Status: DC
  Administered 2015-10-03: 5 ug/min via INTRAVENOUS
  Administered 2015-10-05: 33.333 ug/min via INTRAVENOUS
  Filled 2015-10-03 (×2): qty 250

## 2015-10-03 NOTE — Progress Notes (Signed)
Paged by RN to report patient is having active chest pain, stat ekg was done, i have reviewed ekg personally, there is interval changes, new st depression, t wave inversion inferior leads, will continue cycle troponin, prn nitroglycerin, continue heparin drip, on plavix/asa. Discussed with cardiology PA who will see patient.

## 2015-10-03 NOTE — Progress Notes (Signed)
ANTICOAGULATION CONSULT NOTE - F/u Consult  Pharmacy Consult for heparin Indication: chest pain/ACS  Allergies  Allergen Reactions  . Penicillins Rash    Has patient had a PCN reaction causing immediate rash, facial/tongue/throat swelling, SOB or lightheadedness with hypotension: Yes Has patient had a PCN reaction causing severe rash involving mucus membranes or skin necrosis: Yes Has patient had a PCN reaction that required hospitalization Yes- admitted to hospital Has patient had a PCN reaction occurring within the last 10 years: No, childhood allergy If all of the above answers are "NO", then may proceed with Cephalosporin use.   . Protamine     Internal bleeding around heart  . Amlodipine Swelling    Causes swelling in higher dosage    Patient Measurements: Height: 5\' 4"  (162.6 cm) Weight: 161 lb 13.1 oz (73.4 kg) IBW/kg (Calculated) : 54.7 Heparin Dosing Weight: 70kg  Vital Signs: Temp: 98.3 F (36.8 C) (12/05 0800) Temp Source: Oral (12/05 0400) BP: 142/73 mmHg (12/05 0800) Pulse Rate: 76 (12/05 0800)  Labs:  Recent Labs  10/01/15 1815 10/02/15 0110 10/02/15 0130  10/02/15 1640 10/02/15 2305 10/03/15 0356 10/03/15 0700  HGB 10.9* 11.5*  --   --   --   --  9.6*  --   HCT 32.1* 33.8*  --   --   --   --  28.0*  --   PLT 185 151  --   --   --   --  153  --   LABPROT  --  15.6*  --   --   --   --   --   --   INR  --  1.22  --   --   --   --   --   --   HEPARINUNFRC  --   --   --   --   --  0.80*  --  0.66  CREATININE 0.86  --  1.10*  --   --   --  1.20*  --   TROPONINI  --  <0.03  --   < > 3.98* 4.84* 4.32*  --   < > = values in this interval not displayed.  Estimated Creatinine Clearance: 45.3 mL/min (by C-G formula based on Cr of 1.2).   Medical History: Past Medical History  Diagnosis Date  . Hyperlipidemia   . Hypertension   . Arthritis   . Anemia   . Diastolic HF (heart failure) ([place])     EF 55% by cath 2010  . Bipolar affective disorder ([place])    . GERD (gastroesophageal reflux disease)   . Obesity   . Peripheral neuropathy ([place])   . 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 Coronary atherosclerosis     3 vessel s/p CABG - cath 2005 with widely patent grafts and distal disease past the grafts, cath 2010 with occluded SVG to IM and SVG to OM/PDA with PTCA of left circ and PCI of SVG to OM/PDA  . Carotid artery stenosis     60-79% bilateral ICA stenosis s/p Right CEA 2013  followed by Dr. Donnetta Hutching    Assessment: 84 [place] presenting 12/3 with shortness of breath d/t acute HF exacerbation, c/o chest pain 12/4 with rise in troponin. Has known CAD. Pharmacy asked to dose heparin for rule out NSTEMI vs. Demand ischemia.  Today, 10/03/2015  CBC: Hgb low/stable, pltc at lower limit normal  SCr increased this morning  HL now at goal on current rate of 750 units/hr after being supratherapeutic last night  No reported bleeding  Goal of Therapy:  Heparin level 0.3-0.7 units/ml Monitor platelets by anticoagulation protocol: Yes   Plan:   Continue heparin drip at current rate of 750 units/hr  Recheck 6h heparin level to confirm continued goal level  Daily heparin level and CBC  Will await plans per cards   Adrian Saran, PharmD, BCPS Pager [phone] 10/03/2015 8:20 AM

## 2015-10-03 NOTE — Progress Notes (Signed)
Subjective: SOB, orthopnea, CP  Objective: Vital signs in last 24 hours: Temp:  [97.4 F (36.3 C)-99.1 F (37.3 C)] 98.3 F (36.8 C) (12/05 0800) Pulse Rate:  [66-96] 84 (12/05 1127) Resp:  [19-35] 33 (12/05 1127) BP: (57-172)/(19-80) 172/80 mmHg (12/05 1127) SpO2:  [86 %-100 %] 97 % (12/05 1127) Last BM Date: 10/02/15  Intake/Output from previous day: 12/04 0701 - 12/05 0700 In: 121.6 [I.V.:121.6] Out: 2565 [Urine:2565] Intake/Output this shift: Total I/O In: 277 [P.O.:250; I.V.:27] Out: 430 [Urine:430]  Medications Scheduled Meds: . antiseptic oral rinse  7 mL Mouth Rinse q12n4p  . aspirin EC  81 mg Oral Daily  . carvedilol  12.5 mg Oral BID WC  . chlorhexidine  15 mL Mouth Rinse BID  . clopidogrel  75 mg Oral Daily  . ezetimibe  10 mg Oral Daily  . ferrous sulfate  325 mg Oral Q breakfast  . furosemide  80 mg Intravenous BID  . insulin aspart  0-15 Units Subcutaneous TID WC  . isosorbide mononitrate  30 mg Oral Daily  . olopatadine  1 drop Both Eyes BID  . pantoprazole  20 mg Oral Daily  . rosuvastatin  40 mg Oral q1800   Continuous Infusions: . heparin 750 Units/hr (10/03/15 1100)   PRN Meds:.albuterol, hydrALAZINE, nitroGLYCERIN, perflutren lipid microspheres (DEFINITY) IV suspension  PE: General appearance: alert, cooperative and no distress Neck: no JVD Lungs: Coarse right sided crackles Heart: regular rate and rhythm, S1, S2 normal, no murmur, click, rub or gallop Abdomen: +BS, non-tender Extremities: No LEE Pulses: 2+ and symmetric Skin: Warm and dry Neurologic: Grossly normal  Lab Results:   Recent Labs  10/01/15 1815 10/02/15 0110 10/03/15 0356  WBC 10.5 9.6 9.5  HGB 10.9* 11.5* 9.6*  HCT 32.1* 33.8* 28.0*  PLT 185 151 153   BMET  Recent Labs  10/01/15 1815 10/02/15 0130 10/03/15 0356  NA 130* 133* 133*  K 4.0 4.1 3.5  CL 97* 99* 97*  CO2 24 22 28   GLUCOSE 180* 149* 162*  BUN 25* 29* 33*  CREATININE 0.86 1.10* 1.20*    CALCIUM 8.8* 9.2 8.6*   PT/INR  Recent Labs  10/02/15 0110  LABPROT 15.6*  INR 1.22   Cardiac Panel (last 3 results)  Recent Labs  10/02/15 1640 10/02/15 2305 10/03/15 0356  TROPONINI 3.98* 4.84* 4.32*      Assessment/Plan   Acute on chronic congestive heart failure (HCC)   Net fluids: -2.4/-4.4L.  Coarse crackles on exam.  She just received 80mg  IV lasix at 1000hrs.  SCr trending up.  Seems to be coughing less now, and feels she is breathing better, now that she is sitting up in the chair.   Heart rate 86.      NSTEMI Troponin trending down from 4.84. She just had an episode of CP during the echocardiogram.  EKG shows more pronounced TWI in inferior leads and new TWI laterally.  Current pain level 1/10.  Already on IV heparin.  Adding IV NTG. DC Imdur for now.   Troponin will be recycled.   This may be more than demand ischemia with the new EKG changes.  I think a LHC may be needed.  She can not lay flat at the moment.    ASA, Coreg 12.5bid, plavix, crestor    Hypertension  Will titrate IV NTG for better BP and resolve CP.  PRN IV hydralazine.   Obesity   PVD (peripheral vascular disease) (HCC)  RAS   Acute  on chronic renal insufficiency (HCC)  Worsening.  0.86>>1.10>>1.20   Aortic atherosclerosis (HCC)   Acute respiratory failure with hypoxia (HCC) O2 sats 97-99% on [place].     Carotid stenosis   Hyponatremia  Improved slightly   Dyslipidemia: crestor.    LOS: 2 days    Alizon Schmeling PA-C 10/03/2015 11:43 AM

## 2015-10-03 NOTE — Progress Notes (Signed)
ANTICOAGULATION CONSULT NOTE - F/u Consult  Pharmacy Consult for heparin Indication: chest pain/ACS  Allergies  Allergen Reactions  . Penicillins Rash    Has patient had a PCN reaction causing immediate rash, facial/tongue/throat swelling, SOB or lightheadedness with hypotension: Yes Has patient had a PCN reaction causing severe rash involving mucus membranes or skin necrosis: Yes Has patient had a PCN reaction that required hospitalization Yes- admitted to hospital Has patient had a PCN reaction occurring within the last 10 years: No, childhood allergy If all of the above answers are "NO", then may proceed with Cephalosporin use.   . Protamine     Internal bleeding around heart  . Amlodipine Swelling    Causes swelling in higher dosage    Patient Measurements: Height: 5\' 4"  (162.6 cm) Weight: 161 lb 13.1 oz (73.4 kg) IBW/kg (Calculated) : 54.7 Heparin Dosing Weight: 70kg  Vital Signs: Temp: 98.5 F (36.9 C) (12/05 1127) Temp Source: Oral (12/05 0400) BP: 155/70 mmHg (12/05 1300) Pulse Rate: 84 (12/05 1300)  Labs:  Recent Labs  10/01/15 1815 10/02/15 0110 10/02/15 0130  10/02/15 1640 10/02/15 2305 10/03/15 0356 10/03/15 0700 10/03/15 1227  HGB 10.9* 11.5*  --   --   --   --  9.6*  --   --   HCT 32.1* 33.8*  --   --   --   --  28.0*  --   --   PLT 185 151  --   --   --   --  153  --   --   LABPROT  --  15.6*  --   --   --   --   --   --   --   INR  --  1.22  --   --   --   --   --   --   --   HEPARINUNFRC  --   --   --   --   --  0.80*  --  0.66 0.61  CREATININE 0.86  --  1.10*  --   --   --  1.20*  --   --   TROPONINI  --  <0.03  --   < > 3.98* 4.84* 4.32*  --   --   < > = values in this interval not displayed.  Estimated Creatinine Clearance: 45.3 mL/min (by C-G formula based on Cr of 1.2).   Medical History: Past Medical History  Diagnosis Date  . Hyperlipidemia   . Hypertension   . Arthritis   . Anemia   . Diastolic HF (heart failure) ([place])     EF  55% by cath 2010  . Bipolar affective disorder ([place])   . GERD (gastroesophageal reflux disease)   . Obesity   . Peripheral neuropathy ([place])   . 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 Coronary atherosclerosis     3 vessel s/p CABG - cath 2005 with widely patent grafts and distal disease past the grafts, cath 2010 with occluded SVG to IM and SVG to OM/PDA with PTCA of left circ and PCI of SVG to OM/PDA  . Carotid artery stenosis     60-79% bilateral ICA stenosis s/p Right CEA 2013  followed by Dr. Donnetta Hutching    Assessment: 50 [place] presenting 12/3 with shortness of breath d/t acute HF exacerbation, c/o chest pain  12/4 with rise in troponin. Has known CAD. Pharmacy asked to dose heparin for rule out NSTEMI vs. Demand ischemia.  Today, 10/03/2015  AM  CBC: Hgb low/stable, pltc at lower limit normal  SCr increased this morning  HL now at goal on current rate of 750 units/hr after being supratherapeutic last night  No reported bleeding  PM  Heparin level continues to be therapeutic x 2 on current rate of 750 units/hr  No reported bleeding  Goal of Therapy:  Heparin level 0.3-0.7 units/ml Monitor platelets by anticoagulation protocol: Yes   Plan:   Continue heparin drip at current rate of 750 units/hr  Daily heparin level and CBC  Will await plans per cards   Adrian Saran, PharmD, BCPS Pager [phone] 10/03/2015 1:22 PM

## 2015-10-03 NOTE — Progress Notes (Signed)
Echocardiogram 2D Echocardiogram with Definity has been performed.  Tresa Res 10/03/2015, 12:38 PM

## 2015-10-03 NOTE — Progress Notes (Signed)
ANTICOAGULATION CONSULT NOTE - F/u Consult  Pharmacy Consult for heparin Indication: chest pain/ACS  Allergies  Allergen Reactions  . Penicillins Rash    Has patient had a PCN reaction causing immediate rash, facial/tongue/throat swelling, SOB or lightheadedness with hypotension: Yes Has patient had a PCN reaction causing severe rash involving mucus membranes or skin necrosis: Yes Has patient had a PCN reaction that required hospitalization Yes- admitted to hospital Has patient had a PCN reaction occurring within the last 10 years: No, childhood allergy If all of the above answers are "NO", then may proceed with Cephalosporin use.   . Protamine     Internal bleeding around heart  . Amlodipine Swelling    Causes swelling in higher dosage    Patient Measurements: Height: 5\' 4"  (162.6 cm) Weight: 161 lb 13.1 oz (73.4 kg) IBW/kg (Calculated) : 54.7 Heparin Dosing Weight: 70kg  Vital Signs: Temp: 98.3 F (36.8 C) (12/04 2010) Temp Source: Oral (12/04 2010) BP: 121/41 mmHg (12/04 2300) Pulse Rate: 72 (12/04 2300)  Labs:  Recent Labs  10/01/15 1323 10/01/15 1815 10/02/15 0110 10/02/15 0130  10/02/15 1245 10/02/15 1640 10/02/15 2305  HGB 9.9* 10.9* 11.5*  --   --   --   --   --   HCT 28.6* 32.1* 33.8*  --   --   --   --   --   PLT 143* 185 151  --   --   --   --   --   LABPROT  --   --  15.6*  --   --   --   --   --   INR  --   --  1.22  --   --   --   --   --   HEPARINUNFRC  --   --   --   --   --   --   --  0.80*  CREATININE 1.08* 0.86  --  1.10*  --   --   --   --   TROPONINI  --   --  <0.03  --   < > 1.95* 3.98* 4.84*  < > = values in this interval not displayed.  Estimated Creatinine Clearance: 49.4 mL/min (by C-G formula based on Cr of 1.1).   Medical History: Past Medical History  Diagnosis Date  . Hyperlipidemia   . Hypertension   . Arthritis   . Anemia   . Diastolic HF (heart failure) ([place])     EF 55% by cath 2010  . Bipolar affective disorder ([place])    . GERD (gastroesophageal reflux disease)   . Obesity   . Peripheral neuropathy ([place])   . 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 Coronary atherosclerosis     3 vessel s/p CABG - cath 2005 with widely patent grafts and distal disease past the grafts, cath 2010 with occluded SVG to IM and SVG to OM/PDA with PTCA of left circ and PCI of SVG to OM/PDA  . Carotid artery stenosis     60-79% bilateral ICA stenosis s/p Right CEA 2013  followed by Dr. Donnetta Hutching    Assessment: 33 [place] presenting 12/3 with shortness of breath d/t acute HF exacerbation, c/o chest pain 12/4 with rise in troponin. Has known CAD. Pharmacy asked to dose heparin for rule out NSTEMI vs. Demand ischemia. 10/02/15  CBC: Hgb low/stable, pltc at lower limit normal  SCr increased this morning  On enoxaparin 40mg  SQ - last dose 12/3 at 23:00 Today, 12/5  2305 HL= 0.80, no problem per RN, above goal  Goal of Therapy:  Heparin level 0.3-0.7 units/ml Monitor platelets by anticoagulation protocol: Yes   Plan:   Decrease heparin drip to 750 units/hr  Check 6h heparin level  Daily heparin level and CBC   [place], [place]te 10/03/2015 12:11 AM

## 2015-10-03 NOTE — Progress Notes (Signed)
PROGRESS NOTE  Penny James [id number] DOB: Mar 29, 1951 DOA: 10/01/2015 PCP: Lucretia Kern., DO  HPI/Recap of past 24 hours:  Feeling better, sitting in chair on 2liter,denies chest pain. Report edema has gone down.  On heparin drip  Assessment/Plan: Active Problems:   Obesity   PVD (peripheral vascular disease) (HCC)   Acute on chronic renal insufficiency (HCC)   Aortic atherosclerosis (HCC)   Acute on chronic congestive heart failure (HCC)   CHF (congestive heart failure) (HCC)   Congestive heart failure (CHF) (HCC)   Acute respiratory failure with hypoxia (HCC)  Acute hypercapenic and hypoxic respiratory failure:  likely from chf exacerbation. Initial abg with ph 7.19 co62 p/f ration of 153.  Better off  bipap since 12/4, lung exam has much improved,  May need to back down on lasix, will f/u on cardiology recs.  Combined diastolic and systolic CHF exacerbation:  12/3-s/p lasix iv 80 in the ED, insert foley, additional dose of lasix 80 iv this evening, no acute EKG changes, first set of troponin at baseline, continue cycle troponin, bnp elevated from baseline, echo pending, patient denies chest pain, cardiology consulted.  12/4- was started on bipap last night, off bipap 12/4 pm   NSTEMI: troponin peak at 4.8 now treading down, on heparin drip,plavix, coreg, statin, denies chest pain, echo pending, f/u cards recs.  Hyponatremia:ns 130 on admission, in the setting of volume overload, continue treat chf, repeat bmp in am ns  133.  Elevation of cr:  from cardiorenal syndrome? From diuresis? Close monitor cr.  CAD s/p CABG: last cath in 2010. Currently denies chest pain, continue asa/plavix/betablocker/statin/zetia  HTN: on higher dose of lasix, continue coreg, hold norvasc ( reported allergy?), prn hydralazine, cardiology recommended adding imdur.  Diet controlled diabetes, most recent a1c 6.3 a month ago, continue diet control. Am blood sugar still elevated, will  start on ssi.  H/o renal artery stenosis s/p PTA of the left renal artery, avoid acei and arb.  H/o carotid artery stenosis s/o right CEA in 2013  DVT prophylaxis: lovenox  Code Status: full  Family Communication: patient   Disposition Plan: can go to med tele if ok with cardiology   Consultants:  cardiology  Procedures:  bipap 12/3 to 12/4  Antibiotics:  none   Objective: BP 139/45 mmHg  Pulse 77  Temp(Src) 98.3 F (36.8 C) (Oral)  Resp 30  Ht 5\' 4"  (1.626 m)  Wt 161 lb 13.1 oz (73.4 kg)  BMI 27.76 kg/m2  SpO2 91%  Intake/Output Summary (Last 24 hours) at 10/03/15 0933 Last data filed at 10/03/15 0900  Gross per 24 hour  Intake 386.64 ml  Output   2645 ml  Net -2258.36 ml   Filed Weights   10/01/15 1822 10/02/15 0500  Weight: 164 lb 3.9 oz (74.5 kg) 161 lb 13.1 oz (73.4 kg)    Exam:   General:  NAD  Cardiovascular: RRR  Respiratory: much improved, now only mild crackles at basis  Abdomen: Soft/ND/NT, positive BS  Musculoskeletal: bilateral pedal  Edema has much improved  Neuro: aaox3, no focal deficit  Data Reviewed: Basic Metabolic Panel:  Recent Labs Lab 10/01/15 1323 10/01/15 1815 10/02/15 0130 10/03/15 0356  NA 130* 130* 133* 133*  K 4.0 4.0 4.1 3.5  CL 96* 97* 99* 97*  CO2 26 24 22 28   GLUCOSE 113* 180* 149* 162*  BUN 23* 25* 29* 33*  CREATININE 1.08* 0.86 1.10* 1.20*  CALCIUM 8.9 8.8* 9.2 8.6*  MG  --   --   --  2.1   Liver Function Tests:  Recent Labs Lab 10/01/15 1815  AST 34  ALT 37  ALKPHOS 58  BILITOT 1.0  PROT 7.2  ALBUMIN 4.3   No results for input(s): LIPASE, AMYLASE in the last 168 hours. No results for input(s): AMMONIA in the last 168 hours. CBC:  Recent Labs Lab 10/01/15 1323 10/01/15 1815 10/02/15 0110 10/03/15 0356  WBC 3.5* 10.5 9.6 9.5  HGB 9.9* 10.9* 11.5* 9.6*  HCT 28.6* 32.1* 33.8* 28.0*  MCV 83.9 84.7 84.1 84.1  PLT 143* 185 151 153   Cardiac Enzymes:    Recent Labs Lab  10/02/15 0653 10/02/15 1245 10/02/15 1640 10/02/15 2305 10/03/15 0356  TROPONINI 0.03 1.95* 3.98* 4.84* 4.32*   BNP (last 3 results)  Recent Labs  10/01/15 1324 10/02/15 0110 10/03/15 0356  BNP 1768.2* 2267.0* 1789.0*    ProBNP (last 3 results)  Recent Labs  04/01/15 0738  PROBNP 460.0*    CBG:  Recent Labs Lab 10/02/15 1636 10/02/15 2322 10/03/15 0733  GLUCAP 185* 148* 143*    Recent Results (from the past 240 hour(s))  MRSA PCR Screening     Status: None   Collection Time: 10/01/15  6:05 PM  Result Value Ref Range Status   MRSA by PCR NEGATIVE NEGATIVE Final    Comment:        The GeneXpert MRSA Assay (FDA approved for NASAL specimens only), is one component of a comprehensive MRSA colonization surveillance program. It is not intended to diagnose MRSA infection nor to guide or monitor treatment for MRSA infections.      Studies: No results found.  Scheduled Meds: . antiseptic oral rinse  7 mL Mouth Rinse q12n4p  . aspirin EC  81 mg Oral Daily  . carvedilol  12.5 mg Oral BID WC  . chlorhexidine  15 mL Mouth Rinse BID  . clopidogrel  75 mg Oral Daily  . ezetimibe  10 mg Oral Daily  . ferrous sulfate  325 mg Oral Q breakfast  . furosemide  80 mg Intravenous BID  . insulin aspart  0-15 Units Subcutaneous TID WC  . isosorbide mononitrate  30 mg Oral Daily  . olopatadine  1 drop Both Eyes BID  . pantoprazole  20 mg Oral Daily  . potassium chloride  20 mEq Oral Q4H  . rosuvastatin  40 mg Oral q1800    Continuous Infusions: . heparin 750 Units/hr (10/03/15 0900)     Time spent: 58mins  Khamia Stambaugh MD, PhD  [facility] Pager [phone]. If 7PM-7AM, please contact night-coverage at [email or web address], password [facility] 10/03/2015, 9:33 AM  LOS: 2 days

## 2015-10-03 NOTE — Progress Notes (Signed)
[facility] ADULT ICU REPLACEMENT PROTOCOL FOR AM LAB REPLACEMENT ONLY  The patient does apply for the [facility] Adult ICU Electrolyte Replacment Protocol based on the criteria listed below:   1. Is GFR >/= 40 ml/min? Yes.    Patient's GFR today is 46 2. Is urine output >/= 0.5 ml/kg/hr for the last 6 hours? Yes.   Patient's UOP is 1.8 ml/kg/hr 3. Is BUN < 60 mg/dL? Yes.    Patient's BUN today is 33 4. Abnormal electrolyte(s):K 3.5 5. Ordered repletion with: per protocol 6. If a panic level lab has been reported, has the CCM MD in charge been notified? No..   Physician:    Ronda Fairly A 10/03/2015 6:31 AM

## 2015-10-04 ENCOUNTER — Telehealth: Payer: Self-pay | Admitting: Cardiology

## 2015-10-04 DIAGNOSIS — I5023 Acute on chronic systolic (congestive) heart failure: Secondary | ICD-10-CM

## 2015-10-04 DIAGNOSIS — N189 Chronic kidney disease, unspecified: Secondary | ICD-10-CM

## 2015-10-04 DIAGNOSIS — N179 Acute kidney failure, unspecified: Secondary | ICD-10-CM | POA: Diagnosis present

## 2015-10-04 DIAGNOSIS — I214 Non-ST elevation (NSTEMI) myocardial infarction: Secondary | ICD-10-CM | POA: Diagnosis present

## 2015-10-04 LAB — BASIC METABOLIC PANEL
ANION GAP: 9 (ref 5–15)
BUN: 28 mg/dL — ABNORMAL HIGH (ref 6–20)
CALCIUM: 8.7 mg/dL — AB (ref 8.9–10.3)
CO2: 29 mmol/L (ref 22–32)
CREATININE: 1.04 mg/dL — AB (ref 0.44–1.00)
Chloride: 96 mmol/L — ABNORMAL LOW (ref 101–111)
GFR, EST NON AFRICAN AMERICAN: 55 mL/min — AB (ref >60)
Glucose, Bld: 111 mg/dL — ABNORMAL HIGH (ref 65–99)
Potassium: 3.3 mmol/L — ABNORMAL LOW (ref 3.5–5.1)
SODIUM: 134 mmol/L — AB (ref 135–145)

## 2015-10-04 LAB — BRAIN NATRIURETIC PEPTIDE: B Natriuretic Peptide: 1771.8 pg/mL — ABNORMAL HIGH (ref 0.0–100.0)

## 2015-10-04 LAB — HEPARIN LEVEL (UNFRACTIONATED): Heparin Unfractionated: 0.44 IU/mL (ref 0.30–0.70)

## 2015-10-04 LAB — CBC
HCT: 27.7 % — ABNORMAL LOW (ref 36.0–46.0)
Hemoglobin: 9.4 g/dL — ABNORMAL LOW (ref 12.0–15.0)
MCH: 28.9 pg (ref 26.0–34.0)
MCHC: 33.9 g/dL (ref 30.0–36.0)
MCV: 85.2 fL (ref 78.0–100.0)
PLATELETS: 144 10*3/uL — AB (ref 150–400)
RBC: 3.25 MIL/uL — ABNORMAL LOW (ref 3.87–5.11)
RDW: 14.3 % (ref 11.5–15.5)
WBC: 8.7 10*3/uL (ref 4.0–10.5)

## 2015-10-04 LAB — GLUCOSE, CAPILLARY
GLUCOSE-CAPILLARY: 107 mg/dL — AB (ref 65–99)
GLUCOSE-CAPILLARY: 106 mg/dL — AB (ref 65–99)
Glucose-Capillary: 178 mg/dL — ABNORMAL HIGH (ref 65–99)

## 2015-10-04 LAB — TROPONIN I: TROPONIN I: 2.16 ng/mL — AB (ref <0.031)

## 2015-10-04 MED ORDER — POTASSIUM CHLORIDE CRYS ER 20 MEQ PO TBCR
20.0000 meq | EXTENDED_RELEASE_TABLET | ORAL | Status: AC
  Administered 2015-10-04 (×2): 20 meq via ORAL
  Filled 2015-10-04 (×2): qty 1

## 2015-10-04 MED ORDER — FUROSEMIDE 80 MG PO TABS
80.0000 mg | ORAL_TABLET | Freq: Every day | ORAL | Status: DC
  Administered 2015-10-06: 80 mg via ORAL
  Filled 2015-10-04: qty 1

## 2015-10-04 MED ORDER — LIP MEDEX EX OINT
TOPICAL_OINTMENT | CUTANEOUS | Status: AC
  Administered 2015-10-04: 1
  Filled 2015-10-04: qty 7

## 2015-10-04 NOTE — Progress Notes (Signed)
[facility] ADULT ICU REPLACEMENT PROTOCOL FOR AM LAB REPLACEMENT ONLY  The patient does apply for the [facility] Adult ICU Electrolyte Replacment Protocol based on the criteria listed below:   1. Is GFR >/= 40 ml/min? Yes.    Patient's GFR today is 55 2. Is urine output >/= 0.5 ml/kg/hr for the last 6 hours? Yes.   Patient's UOP is 2.6 ml/kg/hr 3. Is BUN < 60 mg/dL? Yes.    Patient's BUN today is 28 4. Abnormal electrolyte(s): K 3.3 5. Ordered repletion with: per protocol 6. If a panic level lab has been reported, has the CCM MD in charge been notified? No..   Physician:    Ronda Fairly A 10/04/2015 6:29 AM

## 2015-10-04 NOTE — Progress Notes (Signed)
Subjective: Breathing a lot better.  No further CP   Objective: Vital signs in last 24 hours: Temp:  [98.5 F (36.9 C)-98.6 F (37 C)] 98.6 F (37 C) (12/06 0347) Pulse Rate:  [72-87] 74 (12/06 0553) Resp:  [16-39] 26 (12/06 0553) BP: (117-172)/(40-100) 152/59 mmHg (12/06 0400) SpO2:  [90 %-99 %] 97 % (12/06 0553) Weight:  [156 lb 15.5 oz (71.2 kg)] 156 lb 15.5 oz (71.2 kg) (12/06 0347) Last BM Date: 10/02/15  Intake/Output from previous day: 12/05 0701 - 12/06 0700 In: 1162.8 [P.O.:1000; I.V.:162.8] Out: 2290 [Urine:2290] Intake/Output this shift:    Medications Scheduled Meds: . antiseptic oral rinse  7 mL Mouth Rinse q12n4p  . aspirin EC  81 mg Oral Daily  . carvedilol  12.5 mg Oral BID WC  . chlorhexidine  15 mL Mouth Rinse BID  . clopidogrel  75 mg Oral Daily  . ezetimibe  10 mg Oral Daily  . feeding supplement (ENSURE ENLIVE)  237 mL Oral BID BM  . ferrous sulfate  325 mg Oral Q breakfast  . furosemide  80 mg Intravenous BID  . insulin aspart  0-15 Units Subcutaneous TID WC  . olopatadine  1 drop Both Eyes BID  . pantoprazole  20 mg Oral Daily  . potassium chloride  20 mEq Oral Q4H  . rosuvastatin  40 mg Oral q1800   Continuous Infusions: . heparin 750 Units/hr (10/04/15 0400)  . nitroGLYCERIN Stopped (10/03/15 1737)   PRN Meds:.albuterol, hydrALAZINE, nitroGLYCERIN  PE: General appearance: alert, cooperative and no distress Neck: Elevated JVD Lungs: Basilar crackles and decreased BS more on the left Heart: regular rate and rhythm and 1/6 sys MM Extremities: Trace LEE Pulses: 2+ and symmetric Skin: Warm and dry Neurologic: Grossly normal  Lab Results:   Recent Labs  10/02/15 0110 10/03/15 0356 10/04/15 0315  WBC 9.6 9.5 8.7  HGB 11.5* 9.6* 9.4*  HCT 33.8* 28.0* 27.7*  PLT 151 153 144*   BMET  Recent Labs  10/02/15 0130 10/03/15 0356 10/04/15 0315  NA 133* 133* 134*  K 4.1 3.5 3.3*  CL 99* 97* 96*  CO2 22 28 29   GLUCOSE 149*  162* 111*  BUN 29* 33* 28*  CREATININE 1.10* 1.20* 1.04*  CALCIUM 9.2 8.6* 8.7*   PT/INR  Recent Labs  10/02/15 0110  LABPROT 15.6*  INR 1.22      Assessment/Plan   Principal Problem:   Acute on chronic combined systolic and diastolic heart failure (HCC) She looks a lot better today.  Not coughing like she was yesterday. Net fluids:  -1.1L/-5.5L.  BNP 1789 >>1771 today.  SCr decreased a bit today.  EF is 20-25% with diffuse hypokinesis and inferolateral hypokinesis, G2DD, mod to severe MR.  No previous echo. Improving CXR Continue lasix dose.  I asked her to attempt to lay flat sometime today.  Low diastolic pressure.   Coreg 12.5 BID.     Non-ST elevation MI (NSTEMI) ([place]) Its reassuring her troponin continues to trend down after yesterdays episode of CP and EKG changes, however, her EF is 20-25%.  Coronary angiography once able to lay flat.  l added her to the cath schedule for tomorrow at 1000hrs with Dr. Ellyn Hack    Mitral valve regurgitation, mod to severe.  Related to CAD?     CAD Last cardiac cath was 2010 with patent LIMA to the LAD, SVG to diag, SVG to IM occluded, SVG to OM and PDA with 70% and 80% stenosis. She  was noted to have diffuse distal LAD stenosis and the diagonal.    Hypokalemia:  Getting supplementation    Obesity   PVD (peripheral vascular disease) ([place]) renal and carotid disease   Acute on chronic renal insufficiency (HCC)  Slightly improved today.   Aortic atherosclerosis (HCC)   Acute respiratory failure with hypoxia ([place])  Resolved    LOS: 3 days    Payson Evrard PA-C 10/04/2015 8:14 AM

## 2015-10-04 NOTE — Progress Notes (Signed)
ANTICOAGULATION CONSULT NOTE - F/u Consult  Pharmacy Consult for heparin Indication: chest pain/ACS  Allergies  Allergen Reactions  . Penicillins Rash    Has patient had a PCN reaction causing immediate rash, facial/tongue/throat swelling, SOB or lightheadedness with hypotension: Yes Has patient had a PCN reaction causing severe rash involving mucus membranes or skin necrosis: Yes Has patient had a PCN reaction that required hospitalization Yes- admitted to hospital Has patient had a PCN reaction occurring within the last 10 years: No, childhood allergy If all of the above answers are "NO", then may proceed with Cephalosporin use.   . Protamine     Internal bleeding around heart  . Amlodipine Swelling    Causes swelling in higher dosage    Patient Measurements: Height: 5\' 4"  (162.6 cm) Weight: 156 lb 15.5 oz (71.2 kg) IBW/kg (Calculated) : 54.7 Heparin Dosing Weight: 70kg  Vital Signs: Temp: 98.6 F (37 C) (12/06 0347) Temp Source: Oral (12/06 0347) BP: 152/59 mmHg (12/06 0400) Pulse Rate: 74 (12/06 0553)  Labs:  Recent Labs  10/02/15 0110 10/02/15 0130  10/03/15 0356 10/03/15 0700 10/03/15 1227 10/03/15 2054 10/04/15 0300 10/04/15 0315  HGB 11.5*  --   --  9.6*  --   --   --   --  9.4*  HCT 33.8*  --   --  28.0*  --   --   --   --  27.7*  PLT 151  --   --  153  --   --   --   --  144*  LABPROT 15.6*  --   --   --   --   --   --   --   --   INR 1.22  --   --   --   --   --   --   --   --   HEPARINUNFRC  --   --   < >  --  0.66 0.61  --   --  0.44  CREATININE  --  1.10*  --  1.20*  --   --   --   --  1.04*  TROPONINI <0.03  --   < > 4.32*  --  2.46* 2.25* 2.16*  --   < > = values in this interval not displayed.  Estimated Creatinine Clearance: 51.5 mL/min (by C-G formula based on Cr of 1.04).   Medical History: Past Medical History  Diagnosis Date  . Hyperlipidemia   . Hypertension   . Arthritis   . Anemia   . Diastolic HF (heart failure) ([place])     EF  55% by cath 2010  . Bipolar affective disorder ([place])   . GERD (gastroesophageal reflux disease)   . Obesity   . Peripheral neuropathy ([place])   . 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 Coronary atherosclerosis     3 vessel s/p CABG - cath 2005 with widely patent grafts and distal disease past the grafts, cath 2010 with occluded SVG to IM and SVG to OM/PDA with PTCA of left circ and PCI of SVG to OM/PDA  . Carotid artery stenosis     60-79% bilateral ICA stenosis s/p Right CEA 2013  followed by Dr. Donnetta Hutching    Assessment: 34 [place] presenting 12/3 with shortness of breath d/t acute HF exacerbation, c/o chest pain 12/4 with  rise in troponin. Has known CAD. Pharmacy asked to dose heparin for rule out NSTEMI vs. Demand ischemia.  Today, 10/04/2015   CBC: Hgb low/stable, pltc at lower limit normal  SCr improved some  HL continues to be at goal on current rate of 750 units/hr  No reported bleeding  Goal of Therapy:  Heparin level 0.3-0.7 units/ml Monitor platelets by anticoagulation protocol: Yes   Plan:   Continue heparin drip at current rate of 750 units/hr  Daily heparin level and CBC while on IV heparin  Will await plans per cards   Adrian Saran, PharmD, BCPS Pager [phone] 10/04/2015 8:28 AM

## 2015-10-04 NOTE — Progress Notes (Addendum)
PROGRESS NOTE  Penny James [id number] DOB: July 24, 1951 DOA: 10/01/2015 PCP: Lucretia Kern., DO  HPI/Recap of past 24 hours:  Feeling better, on room chair, able to lay flat, no cough, no chest pain.  On heparin drip  Assessment/Plan: Principal Problem:   Acute on chronic systolic (congestive) heart failure (HCC) Active Problems:   Obesity   PVD (peripheral vascular disease) (HCC)   Chronic diastolic CHF (congestive heart failure) (HCC)   Acute on chronic renal insufficiency (HCC)   Aortic atherosclerosis (HCC)   Acute respiratory failure with hypoxia (HCC)   Non-ST elevation MI (NSTEMI) (HCC)   NSTEMI (non-ST elevated myocardial infarction) ([place])  Acute hypercapenic and hypoxic respiratory failure:  from chf exacerbation. Initial abg with ph 7.19 co62 p/f ration of 153.  Better off  bipap since 12/4, lung exam has much improved,  Received IV lasix 80mg  bid since admission, change to oral lasix 80mg  po qd from 12/7 per cardiology, ordered.  Combined diastolic and systolic CHF exacerbation:  Echo with new reduced EF Received IV lasix 80mg  bid since admission, change to oral lasix 80mg  po qd from 12/7 per cardiology, ordered. On room air since 12/6   NSTEMI:  troponin peak at 4.8, treaded down, had one episode of chest pain in the hospital on 12/5 on heparin drip, was on nitroglycerin drip briefly on 12/5. Continue plavix, coreg, statin,  denies chest pain today, possible cardiac cath tomorrow on 12/7 or 12/8 Will keep npo after midnight for now, f/u cards recs in am.  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   cr improving.  CAD s/p CABG: last cath in 2010. Currently denies chest pain, continue asa/plavix/betablocker/statin/zetia  HTN:  on lasix, continue coreg, hold norvasc ( reported allergy?), prn hydralazine, was on  imdur a few days (started and d/ced by cardiology)  Diet controlled diabetes, most recent a1c 6.3 a month ago, continue diet control. Am blood sugar elevated, started on ssi.  H/o renal artery stenosis s/p PTA of the left renal artery, avoid acei and arb.  H/o carotid artery stenosis s/o right CEA in 2013  DVT prophylaxis: lovenox  Code Status: full  Family Communication: patient   Disposition Plan: likely able to go to med /tele in 1-2 days if ok with cardiology   Consultants:  cardiology  Procedures:  bipap 12/3 to 12/4  Antibiotics:  none   Objective: BP 129/54 mmHg  Pulse 73  Temp(Src) 98.7 F (37.1 C) (Oral)  Resp 20  Ht 5\' 4"  (1.626 m)  Wt 156 lb 15.5 oz (71.2 kg)  BMI 26.93 kg/m2  SpO2 90%  Intake/Output Summary (Last 24 hours) at 10/04/15 1558 Last data filed at 10/04/15 1500  Gross per 24 hour  Intake 993.93 ml  Output   2320 ml  Net -1326.07 ml   Filed Weights   10/01/15 1822 10/02/15 0500 10/04/15 0347  Weight: 164 lb 3.9 oz (74.5 kg) 161 lb 13.1 oz (73.4 kg) 156 lb 15.5 oz (71.2 kg)    Exam:   General:  NAD  Cardiovascular: RRR  Respiratory: much improved, now only mild crackles at basis  Abdomen: Soft/ND/NT, positive BS  Musculoskeletal: bilateral pedal  Edema has much improved  Neuro: aaox3, no focal deficit  Data Reviewed: Basic Metabolic Panel:  Recent Labs Lab 10/01/15 1323 10/01/15 1815 10/02/15 0130 10/03/15 0356 10/04/15 0315  NA 130* 130* 133* 133* 134*  K 4.0 4.0 4.1 3.5 3.3*  CL 96* 97* 99* 97* 96*  CO2 26 24 22 28 29   GLUCOSE 113* 180* 149* 162* 111*  BUN 23* 25* 29* 33* 28*  CREATININE 1.08* 0.86 1.10* 1.20* 1.04*  CALCIUM 8.9 8.8* 9.2 8.6* 8.7*  MG  --   --   --  2.1  --    Liver Function Tests:  Recent Labs Lab 10/01/15 1815  AST 34  ALT 37  ALKPHOS 58  BILITOT 1.0  PROT 7.2  ALBUMIN 4.3   No results for input(s): LIPASE, AMYLASE in the last 168 hours. No results for input(s): AMMONIA in  the last 168 hours. CBC:  Recent Labs Lab 10/01/15 1323 10/01/15 1815 10/02/15 0110 10/03/15 0356 10/04/15 0315  WBC 3.5* 10.5 9.6 9.5 8.7  HGB 9.9* 10.9* 11.5* 9.6* 9.4*  HCT 28.6* 32.1* 33.8* 28.0* 27.7*  MCV 83.9 84.7 84.1 84.1 85.2  PLT 143* 185 151 153 144*   Cardiac Enzymes:    Recent Labs Lab 10/02/15 2305 10/03/15 0356 10/03/15 1227 10/03/15 2054 10/04/15 0300  TROPONINI 4.84* 4.32* 2.46* 2.25* 2.16*   BNP (last 3 results)  Recent Labs  10/02/15 0110 10/03/15 0356 10/04/15 0315  BNP 2267.0* 1789.0* 1771.8*    ProBNP (last 3 results)  Recent Labs  04/01/15 0738  PROBNP 460.0*    CBG:  Recent Labs Lab 10/03/15 1202 10/03/15 1608 10/03/15 2141 10/04/15 0733 10/04/15 1154  GLUCAP 197* 205* 151* 107* 178*    Recent Results (from the past 240 hour(s))  MRSA PCR Screening     Status: None   Collection Time: 10/01/15  6:05 PM  Result Value Ref Range Status   MRSA by PCR NEGATIVE NEGATIVE Final    Comment:        The GeneXpert MRSA Assay (FDA approved for NASAL specimens only), is one component of a comprehensive MRSA colonization surveillance program. It is not intended to diagnose MRSA infection nor to guide or monitor treatment for MRSA infections.   Culture, expectorated sputum-assessment     Status: None   Collection Time: 10/03/15  5:00 PM  Result Value Ref Range Status   Specimen Description SPUTUM  Final   Special Requests NONE  Final   Sputum evaluation   Final    MICROSCOPIC FINDINGS SUGGEST THAT THIS SPECIMEN IS NOT REPRESENTATIVE OF LOWER RESPIRATORY SECRETIONS. PLEASE RECOLLECT. RESULT CALLED TO, READ BACK BY AND VERIFIED WITH: MICHELLE REEVES RN 12.5.16 @ 2000 BY RICEJ    Report Status 10/03/2015 FINAL  Final     Studies: No results found.  Scheduled Meds: . antiseptic oral rinse  7 mL Mouth Rinse q12n4p  . aspirin EC  81 mg Oral Daily  . carvedilol  12.5 mg Oral BID WC  . chlorhexidine  15 mL Mouth Rinse BID  .  clopidogrel  75 mg Oral Daily  . ezetimibe  10 mg Oral Daily  . feeding supplement (ENSURE ENLIVE)  237 mL Oral BID BM  . ferrous sulfate  325 mg Oral Q breakfast  . furosemide  80 mg Intravenous BID  . [START ON 10/05/2015] furosemide  80 mg Oral Daily  . insulin aspart  0-15 Units Subcutaneous TID WC  . olopatadine  1 drop Both Eyes BID  . pantoprazole  20 mg Oral Daily  . rosuvastatin  40 mg Oral q1800    Continuous Infusions: . heparin 750 Units/hr (10/04/15 0800)  . nitroGLYCERIN Stopped (10/03/15 1737)  Time spent: 83mins  Benicio Manna MD, PhD  [facility] Pager [phone]. If 7PM-7AM, please contact night-coverage at [email or web address], password [facility] 10/04/2015, 3:58 PM  LOS: 3 days

## 2015-10-04 NOTE — Telephone Encounter (Signed)
New message      Calling to let Dr Radford Pax know that pt is in [place] and she will be going [facility] for a heart cath.

## 2015-10-04 NOTE — Telephone Encounter (Signed)
To Dr. Radford Pax for her information.

## 2015-10-04 NOTE — Progress Notes (Signed)
Update - Gave patient Heart Failure Booklet.  Pt very interested in what she is able to do to prevent future admissions. Instructed pt on the importance of daily weights, decreased fluid and sodium intake and times when to call the doctor. Pt was interested in heart failure clinic and case management requesting home services as able.

## 2015-10-05 ENCOUNTER — Encounter (HOSPITAL_COMMUNITY): Payer: Medicare Other

## 2015-10-05 ENCOUNTER — Encounter (HOSPITAL_COMMUNITY)
Admission: EM | Disposition: A | Discharge status: Home / Self Care | Payer: Self-pay | Source: Home / Self Care | Attending: Internal Medicine

## 2015-10-05 ENCOUNTER — Inpatient Hospital Stay (HOSPITAL_COMMUNITY): Admit: 2015-10-05 | Payer: Medicare Other

## 2015-10-05 DIAGNOSIS — N179 Acute kidney failure, unspecified: Secondary | ICD-10-CM

## 2015-10-05 DIAGNOSIS — I25810 Atherosclerosis of coronary artery bypass graft(s) without angina pectoris: Secondary | ICD-10-CM | POA: Diagnosis present

## 2015-10-05 DIAGNOSIS — I255 Ischemic cardiomyopathy: Secondary | ICD-10-CM | POA: Diagnosis present

## 2015-10-05 DIAGNOSIS — E669 Obesity, unspecified: Secondary | ICD-10-CM

## 2015-10-05 DIAGNOSIS — I2510 Atherosclerotic heart disease of native coronary artery without angina pectoris: Secondary | ICD-10-CM

## 2015-10-05 DIAGNOSIS — J9601 Acute respiratory failure with hypoxia: Secondary | ICD-10-CM

## 2015-10-05 DIAGNOSIS — I5032 Chronic diastolic (congestive) heart failure: Secondary | ICD-10-CM

## 2015-10-05 DIAGNOSIS — I5043 Acute on chronic combined systolic (congestive) and diastolic (congestive) heart failure: Secondary | ICD-10-CM

## 2015-10-05 HISTORY — PX: CARDIAC CATHETERIZATION: SHX172

## 2015-10-05 LAB — BASIC METABOLIC PANEL
Anion gap: 8 (ref 5–15)
BUN: 24 mg/dL — AB (ref 6–20)
CHLORIDE: 98 mmol/L — AB (ref 101–111)
CO2: 30 mmol/L (ref 22–32)
CREATININE: 1.08 mg/dL — AB (ref 0.44–1.00)
Calcium: 9.2 mg/dL (ref 8.9–10.3)
GFR calc non Af Amer: 52 mL/min — ABNORMAL LOW (ref >60)
GLUCOSE: 113 mg/dL — AB (ref 65–99)
Potassium: 3.6 mmol/L (ref 3.5–5.1)
Sodium: 136 mmol/L (ref 135–145)

## 2015-10-05 LAB — GLUCOSE, CAPILLARY
GLUCOSE-CAPILLARY: 114 mg/dL — AB (ref 65–99)
GLUCOSE-CAPILLARY: 133 mg/dL — AB (ref 65–99)
Glucose-Capillary: 148 mg/dL — ABNORMAL HIGH (ref 65–99)
Glucose-Capillary: 134 mg/dL — ABNORMAL HIGH (ref 65–99)

## 2015-10-05 LAB — CBC
HEMATOCRIT: 31.4 % — AB (ref 36.0–46.0)
HEMOGLOBIN: 10.4 g/dL — AB (ref 12.0–15.0)
MCH: 28.3 pg (ref 26.0–34.0)
MCHC: 33.1 g/dL (ref 30.0–36.0)
MCV: 85.3 fL (ref 78.0–100.0)
Platelets: 155 10*3/uL (ref 150–400)
RBC: 3.68 MIL/uL — ABNORMAL LOW (ref 3.87–5.11)
RDW: 14.2 % (ref 11.5–15.5)
WBC: 5.5 10*3/uL (ref 4.0–10.5)

## 2015-10-05 LAB — HEPARIN LEVEL (UNFRACTIONATED): Heparin Unfractionated: 0.72 IU/mL — ABNORMAL HIGH (ref 0.30–0.70)

## 2015-10-05 LAB — MRSA PCR SCREENING: MRSA by PCR: NEGATIVE

## 2015-10-05 SURGERY — LEFT HEART CATH AND CORS/GRAFTS ANGIOGRAPHY
Anesthesia: LOCAL

## 2015-10-05 MED ORDER — SODIUM CHLORIDE 0.9 % IV SOLN
INTRAVENOUS | Status: DC
  Administered 2015-10-05: 06:00:00 via INTRAVENOUS

## 2015-10-05 MED ORDER — IOHEXOL 350 MG/ML SOLN
INTRAVENOUS | Status: DC | PRN
  Administered 2015-10-05: 125 mL via INTRACARDIAC

## 2015-10-05 MED ORDER — SODIUM CHLORIDE 0.9 % IV SOLN
INTRAVENOUS | Status: DC | PRN
  Administered 2015-10-05: 10 mL/h via INTRAVENOUS

## 2015-10-05 MED ORDER — MIDAZOLAM HCL 2 MG/2ML IJ SOLN
INTRAMUSCULAR | Status: DC | PRN
  Administered 2015-10-05: 2 mg via INTRAVENOUS

## 2015-10-05 MED ORDER — HEPARIN (PORCINE) IN NACL 2-0.9 UNIT/ML-% IJ SOLN
INTRAMUSCULAR | Status: AC
  Filled 2015-10-05: qty 1000

## 2015-10-05 MED ORDER — SODIUM CHLORIDE 0.9 % IJ SOLN
3.0000 mL | Freq: Two times a day (BID) | INTRAMUSCULAR | Status: DC
  Administered 2015-10-06: 3 mL via INTRAVENOUS

## 2015-10-05 MED ORDER — SODIUM CHLORIDE 0.9 % IJ SOLN: 3.0000 mL | INTRAMUSCULAR | Status: DC | PRN

## 2015-10-05 MED ORDER — ACETAMINOPHEN 325 MG PO TABS: 650.0000 mg | ORAL_TABLET | ORAL | Status: DC | PRN

## 2015-10-05 MED ORDER — LIDOCAINE HCL (PF) 1 % IJ SOLN
INTRAMUSCULAR | Status: AC
  Filled 2015-10-05: qty 30

## 2015-10-05 MED ORDER — LIDOCAINE HCL (PF) 1 % IJ SOLN
INTRAMUSCULAR | Status: DC | PRN
  Administered 2015-10-05: 17:00:00

## 2015-10-05 MED ORDER — SODIUM CHLORIDE 0.9 % IV SOLN: 250.0000 mL | INTRAVENOUS | Status: DC | PRN

## 2015-10-05 MED ORDER — FENTANYL CITRATE (PF) 100 MCG/2ML IJ SOLN
INTRAMUSCULAR | Status: DC | PRN
  Administered 2015-10-05: 25 ug via INTRAVENOUS

## 2015-10-05 MED ORDER — FENTANYL CITRATE (PF) 100 MCG/2ML IJ SOLN
INTRAMUSCULAR | Status: AC
  Filled 2015-10-05: qty 2

## 2015-10-05 MED ORDER — ONDANSETRON HCL 4 MG/2ML IJ SOLN: 4.0000 mg | Freq: Four times a day (QID) | INTRAMUSCULAR | Status: DC | PRN

## 2015-10-05 MED ORDER — ASPIRIN 81 MG PO CHEW
81.0000 mg | CHEWABLE_TABLET | ORAL | Status: AC
  Administered 2015-10-05: 81 mg via ORAL
  Filled 2015-10-05: qty 1

## 2015-10-05 MED ORDER — SODIUM CHLORIDE 0.9 % IV SOLN: INTRAVENOUS | Status: DC

## 2015-10-05 MED ORDER — SODIUM CHLORIDE 0.9 % IV SOLN: INTRAVENOUS | Status: AC

## 2015-10-05 MED ORDER — SODIUM CHLORIDE 0.9 % IV SOLN
250.0000 mL | INTRAVENOUS | Status: DC | PRN
  Administered 2015-10-05: 10 mL via INTRAVENOUS

## 2015-10-05 MED ORDER — ISOSORBIDE MONONITRATE ER 30 MG PO TB24
30.0000 mg | ORAL_TABLET | Freq: Every day | ORAL | Status: DC
  Administered 2015-10-06 – 2015-10-07 (×2): 30 mg via ORAL
  Filled 2015-10-05 (×2): qty 1

## 2015-10-05 MED ORDER — HEPARIN (PORCINE) IN NACL 100-0.45 UNIT/ML-% IJ SOLN
700.0000 [IU]/h | INTRAMUSCULAR | Status: DC
  Filled 2015-10-05: qty 250

## 2015-10-05 MED ORDER — ASPIRIN EC 81 MG PO TBEC
81.0000 mg | DELAYED_RELEASE_TABLET | Freq: Every day | ORAL | Status: DC
  Administered 2015-10-06 – 2015-10-10 (×5): 81 mg via ORAL
  Filled 2015-10-05 (×5): qty 1

## 2015-10-05 MED ORDER — SODIUM CHLORIDE 0.9 % IJ SOLN
3.0000 mL | Freq: Two times a day (BID) | INTRAMUSCULAR | Status: DC
  Administered 2015-10-05: 3 mL via INTRAVENOUS

## 2015-10-05 MED ORDER — HEPARIN (PORCINE) IN NACL 100-0.45 UNIT/ML-% IJ SOLN
900.0000 [IU]/h | INTRAMUSCULAR | Status: DC
  Administered 2015-10-06: 700 [IU]/h via INTRAVENOUS
  Administered 2015-10-07: 950 [IU]/h via INTRAVENOUS
  Filled 2015-10-05 (×2): qty 250

## 2015-10-05 MED ORDER — MIDAZOLAM HCL 2 MG/2ML IJ SOLN
INTRAMUSCULAR | Status: AC
  Filled 2015-10-05: qty 2

## 2015-10-05 SURGICAL SUPPLY — 10 items
CATH INFINITI 5FR MULTPACK ANG (CATHETERS) ×2 IMPLANT
CATH SITESEER 5F NTR (CATHETERS) ×2 IMPLANT
KIT HEART LEFT (KITS) ×2 IMPLANT
PACK CARDIAC CATHETERIZATION (CUSTOM PROCEDURE TRAY) ×2 IMPLANT
SHEATH PINNACLE 5F 10CM (SHEATH) ×2 IMPLANT
SYR MEDRAD MARK V 150ML (SYRINGE) ×2 IMPLANT
TRANSDUCER W/STOPCOCK (MISCELLANEOUS) ×2 IMPLANT
TUBING CIL FLEX 10 FLL-RA (TUBING) ×2 IMPLANT
WIRE EMERALD 3MM-J .035X150CM (WIRE) ×2 IMPLANT
WIRE SAFE-T 1.5MM-J .035X260CM (WIRE) ×2 IMPLANT

## 2015-10-05 NOTE — Interval H&P Note (Signed)
Cath Lab Visit (complete for each Cath Lab visit)  Clinical Evaluation Leading to the Procedure:   ACS: Yes.    Non-ACS:    Anginal Classification: CCS IV  Anti-ischemic medical therapy: Minimal Therapy (1 class of medications)  Non-Invasive Test Results: No non-invasive testing performed  Prior CABG: Previous CABG      History and Physical Interval Note:  10/05/2015 4:02 PM  Penny James  has presented today for surgery, with the diagnosis of nstemi  The various methods of treatment have been discussed with the patient and family. After consideration of risks, benefits and other options for treatment, the patient has consented to  Procedure(s): Left Heart Cath and Cors/Grafts Angiography (N/A) as a surgical intervention .  The patient's history has been reviewed, patient examined, no change in status, stable for surgery.  I have reviewed the patient's chart and labs.  Questions were answered to the patient's satisfaction.     Maraya Gwilliam A

## 2015-10-05 NOTE — H&P (View-Only) (Signed)
Patient Profile: 64 y/o female with h/o CAD s/p CABG and combined systolic + diastolic CHF admitted for acute exacerbation. Found to have new reduction in EF down from 53% on nuclear study in 2010 to 20-25% on recent echo. Also with notable enzyme elevation, with troponin peak at 2.46.    Subjective: Feels better. Denies CP. Breathing improved. No further orthopnea/PND. Ankle edema improved.   Objective: Vital signs in last 24 hours: Temp:  [98.1 F (36.7 C)-98.7 F (37.1 C)] 98.1 F (36.7 C) (12/07 0312) Pulse Rate:  [71-76] 73 (12/07 0600) Resp:  [18-28] 18 (12/07 0600) BP: (127-164)/(47-67) 155/57 mmHg (12/07 0600) SpO2:  [90 %-100 %] 99 % (12/07 0600) Weight:  [148 lb 2.4 oz (67.2 kg)] 148 lb 2.4 oz (67.2 kg) (12/07 0600) Last BM Date: 10/02/15  Intake/Output from previous day: 12/06 0701 - 12/07 0700 In: 720.2 [P.O.:540; I.V.:180.2] Out: 3320 [Urine:3320] Intake/Output this shift:    Medications Current Facility-Administered Medications  Medication Dose Route Frequency Provider Last Rate Last Dose  . 0.9 %  sodium chloride infusion  250 mL Intravenous PRN Brett Canales, PA-C      . 0.9 %  sodium chloride infusion   Intravenous Continuous Leonie Man, MD 10 mL/hr at 10/05/15 [phone]    . albuterol (PROVENTIL) (2.5 MG/3ML) 0.083% nebulizer solution 2.5 mg  2.5 mg Nebulization Q6H PRN Florencia Reasons, MD      . antiseptic oral rinse (CPC / CETYLPYRIDINIUM CHLORIDE 0.05%) solution 7 mL  7 mL Mouth Rinse q12n4p Reyne Dumas, MD   7 mL at 10/03/15 1600  . [START ON 10/06/2015] aspirin chewable tablet 81 mg  81 mg Oral Pre-Cath Einar Pheasant Hager, PA-C      . aspirin EC tablet 81 mg  81 mg Oral Daily Florencia Reasons, MD   81 mg at 10/04/15 1001  . carvedilol (COREG) tablet 12.5 mg  12.5 mg Oral BID WC Florencia Reasons, MD   12.5 mg at 10/04/15 1632  . chlorhexidine (PERIDEX) 0.12 % solution 15 mL  15 mL Mouth Rinse BID Reyne Dumas, MD   15 mL at 10/04/15 2256  . clopidogrel (PLAVIX) tablet 75 mg  75 mg  Oral Daily Florencia Reasons, MD   75 mg at 10/04/15 1002  . ezetimibe (ZETIA) tablet 10 mg  10 mg Oral Daily Florencia Reasons, MD   10 mg at 10/04/15 1002  . feeding supplement (ENSURE ENLIVE) (ENSURE ENLIVE) liquid 237 mL  237 mL Oral BID BM Clayton Bibles, RD   237 mL at 10/04/15 1003  . ferrous sulfate tablet 325 mg  325 mg Oral Q breakfast Florencia Reasons, MD   325 mg at 10/04/15 0759  . furosemide (LASIX) tablet 80 mg  80 mg Oral Daily Belva Crome, MD      . heparin ADULT infusion 100 units/mL (25000 units/250 mL)  750 Units/hr Intravenous Continuous Dorrene German, RPH 7.5 mL/hr at 10/05/15 0615 750 Units/hr at 10/05/15 0615  . hydrALAZINE (APRESOLINE) injection 5 mg  5 mg Intravenous Q4H PRN Florencia Reasons, MD      . insulin aspart (novoLOG) injection 0-15 Units  0-15 Units Subcutaneous TID WC Florencia Reasons, MD   3 Units at 10/04/15 1227  . nitroGLYCERIN (NITROSTAT) SL tablet 0.4 mg  0.4 mg Sublingual Q5 min PRN Florencia Reasons, MD      . nitroGLYCERIN 50 mg in dextrose 5 % 250 mL (0.2 mg/mL) infusion  0-200 mcg/min Intravenous Titrated Brett Canales, PA-C  Stopped at 10/03/15 1737  . olopatadine (PATANOL) 0.1 % ophthalmic solution 1 drop  1 drop Both Eyes BID Florencia Reasons, MD   1 drop at 10/04/15 2253  . pantoprazole (PROTONIX) EC tablet 20 mg  20 mg Oral Daily Florencia Reasons, MD   20 mg at 10/04/15 1002  . rosuvastatin (CRESTOR) tablet 40 mg  40 mg Oral q1800 Florencia Reasons, MD   40 mg at 10/04/15 1724  . sodium chloride 0.9 % injection 3 mL  3 mL Intravenous Q12H Einar Pheasant Hager, PA-C      . sodium chloride 0.9 % injection 3 mL  3 mL Intravenous PRN Brett Canales, PA-C        PE: General appearance: alert, cooperative and no distress Neck: no carotid bruit and no JVD Lungs: clear to auscultation bilaterally Heart: regular rate and rhythm and 1/6 SM Extremities: no LEE Pulses: 2+ and symmetric Skin: warm and dry Neurologic: Grossly normal  Lab Results:   Recent Labs  10/03/15 0356 10/04/15 0315 10/05/15 0345  WBC 9.5 8.7 5.5  HGB 9.6*  9.4* 10.4*  HCT 28.0* 27.7* 31.4*  PLT 153 144* 155   BMET  Recent Labs  10/03/15 0356 10/04/15 0315 10/05/15 0345  NA 133* 134* 136  K 3.5 3.3* 3.6  CL 97* 96* 98*  CO2 28 29 30   GLUCOSE 162* 111* 113*  BUN 33* 28* 24*  CREATININE 1.20* 1.04* 1.08*  CALCIUM 8.6* 8.7* 9.2   Cardiac Panel (last 3 results)  Recent Labs  10/03/15 1227 10/03/15 2054 10/04/15 0300  TROPONINI 2.46* 2.25* 2.16*    Studies/Results: 10/03/15 Study Conclusions  - Left ventricle: The cavity size was normal. Wall thickness was normal. Systolic function was severely reduced. The estimated ejection fraction was in the range of 20% to 25%. Diffuse hypokinesis with inferolateral akinesis. E/medial e&' > 15 suggesting LV end diastolic pressurea at least 20 mmHg. Features are consistent with a pseudonormal left ventricular filling pattern, with concomitant abnormal relaxation and increased filling pressure (grade 2 diastolic dysfunction). - Aortic valve: There was no stenosis. There was trivial regurgitation. - Mitral valve: Mildly calcified annulus. Mildly calcified leaflets . There was moderate to severe regurgitation. The posterior leaflet was restricted. Valve area by pressure half-time: 4.4 cm^2. - Left atrium: The atrium was moderately dilated. - Right ventricle: The cavity size was normal. Systolic function was normal. - Tricuspid valve: Peak RV-RA gradient (S): 33 mm Hg. - Pulmonary arteries: PA peak pressure: 48 mm Hg (S). - Systemic veins: IVC measured 2.3 cm with < 50% respirophasic variation, suggesting RA pressure 15 mmHg.  Impressions:  - Normal LV size with EF 20-25%. Diffuse hypokinesis with inferolateral akinesis. Moderate diastolic dysfunction with evidence for elevated LV filling pressure. Normal RV size and systolic function. Moderate to severe MR with restricted posterior leaflet. Mild pulmonary hypertension.   Assessment/Plan     Principal Problem:   Acute on chronic systolic (congestive) heart failure (HCC) Active Problems:   Obesity   PVD (peripheral vascular disease) (HCC)   Chronic diastolic CHF (congestive heart failure) (HCC)   Acute on chronic renal insufficiency (HCC)   Aortic atherosclerosis (HCC)   Acute respiratory failure with hypoxia (HCC)   Non-ST elevation MI (NSTEMI) (HCC)   NSTEMI (non-ST elevated myocardial infarction) ([place])   Acute kidney injury superimposed on CKD ([place])    1. NSTEMI: troponin peaked at 2.46. Now with downward trend. EF down to 20-25%. Denies CP. Known h/o CAD/CABG. Plan for [facility] today  to reassess graft patency and native coronary anatomy. Continue IV heparin, ASA, Plavix, Coreg and Crestor.   2. Acute Combined Systolic + Diastolic CHF: new reduction in EF down to 20-25%. Plan for [facility] today +/- PCI. She appears euvolemic on physical exam. No further orthopnea. Currently on 80 mg PO lasix. Will hold AM dose for cath. Continue BB therapy with Coreg. Hold ACE/ARB initiation until post cath, to ensure renal function will remain stable. Low sodium diet and daily weights.   3. AKI: renal function improved. SCr down from 1.20 to 1.08 today. F/u BMP in the am, post cath.    LOS: 4 days    Penny James 10/05/2015 7:47 AM

## 2015-10-05 NOTE — Care Management Note (Signed)
Case Management Note  Patient Details  Name: Penny James MRN: [id number] Date of Birth: [birth year]  Subjective/Objective:           Heart failure, nstemi, aki         Action/Plan:Date: October 05, 2015 Chart reviewed for concurrent status and case management needs. Will continue to follow patient for changes and needs: Velva Harman, RN, BSN, [place]   [phone]   Expected Discharge Date:  10/06/15               Expected Discharge Plan:  Home/Self Care  In-House Referral:  NA  Discharge planning Services  CM Consult  Post Acute Care Choice:  NA Choice offered to:  NA  DME Arranged:  N/A DME Agency:  NA  HH Arranged:  NA HH Agency:  NA  Status of Service:  In process, will continue to follow  Medicare Important Message Given:    Date Medicare IM Given:    Medicare IM give by:    Date Additional Medicare IM Given:    Additional Medicare Important Message give by:     If discussed at [place] of Stay Meetings, dates discussed:    Additional Comments:  Leeroy Cha, RN 10/05/2015, 10:30 AM

## 2015-10-05 NOTE — Progress Notes (Signed)
Patient is currently on room air with sats of 98%. All vitals are stable and patient is in no distress. BIPAP is not needed at this time.

## 2015-10-05 NOTE — Progress Notes (Signed)
Patient Profile: 64 y/o female with h/o CAD s/p CABG and combined systolic + diastolic CHF admitted for acute exacerbation. Found to have new reduction in EF down from 53% on nuclear study in 2010 to 20-25% on recent echo. Also with notable enzyme elevation, with troponin peak at 2.46.    Subjective: Feels better. Denies CP. Breathing improved. No further orthopnea/PND. Ankle edema improved.   Objective: Vital signs in last 24 hours: Temp:  [98.1 F (36.7 C)-98.7 F (37.1 C)] 98.1 F (36.7 C) (12/07 0312) Pulse Rate:  [71-76] 73 (12/07 0600) Resp:  [18-28] 18 (12/07 0600) BP: (127-164)/(47-67) 155/57 mmHg (12/07 0600) SpO2:  [90 %-100 %] 99 % (12/07 0600) Weight:  [148 lb 2.4 oz (67.2 kg)] 148 lb 2.4 oz (67.2 kg) (12/07 0600) Last BM Date: 10/02/15  Intake/Output from previous day: 12/06 0701 - 12/07 0700 In: 720.2 [P.O.:540; I.V.:180.2] Out: 3320 [Urine:3320] Intake/Output this shift:    Medications Current Facility-Administered Medications  Medication Dose Route Frequency Provider Last Rate Last Dose  . 0.9 %  sodium chloride infusion  250 mL Intravenous PRN Brett Canales, PA-C      . 0.9 %  sodium chloride infusion   Intravenous Continuous Leonie Man, MD 10 mL/hr at 10/05/15 [phone]    . albuterol (PROVENTIL) (2.5 MG/3ML) 0.083% nebulizer solution 2.5 mg  2.5 mg Nebulization Q6H PRN Florencia Reasons, MD      . antiseptic oral rinse (CPC / CETYLPYRIDINIUM CHLORIDE 0.05%) solution 7 mL  7 mL Mouth Rinse q12n4p Reyne Dumas, MD   7 mL at 10/03/15 1600  . [START ON 10/06/2015] aspirin chewable tablet 81 mg  81 mg Oral Pre-Cath Einar Pheasant Hager, PA-C      . aspirin EC tablet 81 mg  81 mg Oral Daily Florencia Reasons, MD   81 mg at 10/04/15 1001  . carvedilol (COREG) tablet 12.5 mg  12.5 mg Oral BID WC Florencia Reasons, MD   12.5 mg at 10/04/15 1632  . chlorhexidine (PERIDEX) 0.12 % solution 15 mL  15 mL Mouth Rinse BID Reyne Dumas, MD   15 mL at 10/04/15 2256  . clopidogrel (PLAVIX) tablet 75 mg  75 mg  Oral Daily Florencia Reasons, MD   75 mg at 10/04/15 1002  . ezetimibe (ZETIA) tablet 10 mg  10 mg Oral Daily Florencia Reasons, MD   10 mg at 10/04/15 1002  . feeding supplement (ENSURE ENLIVE) (ENSURE ENLIVE) liquid 237 mL  237 mL Oral BID BM Clayton Bibles, RD   237 mL at 10/04/15 1003  . ferrous sulfate tablet 325 mg  325 mg Oral Q breakfast Florencia Reasons, MD   325 mg at 10/04/15 0759  . furosemide (LASIX) tablet 80 mg  80 mg Oral Daily Belva Crome, MD      . heparin ADULT infusion 100 units/mL (25000 units/250 mL)  750 Units/hr Intravenous Continuous Dorrene German, RPH 7.5 mL/hr at 10/05/15 0615 750 Units/hr at 10/05/15 0615  . hydrALAZINE (APRESOLINE) injection 5 mg  5 mg Intravenous Q4H PRN Florencia Reasons, MD      . insulin aspart (novoLOG) injection 0-15 Units  0-15 Units Subcutaneous TID WC Florencia Reasons, MD   3 Units at 10/04/15 1227  . nitroGLYCERIN (NITROSTAT) SL tablet 0.4 mg  0.4 mg Sublingual Q5 min PRN Florencia Reasons, MD      . nitroGLYCERIN 50 mg in dextrose 5 % 250 mL (0.2 mg/mL) infusion  0-200 mcg/min Intravenous Titrated Brett Canales, PA-C  Stopped at 10/03/15 1737  . olopatadine (PATANOL) 0.1 % ophthalmic solution 1 drop  1 drop Both Eyes BID Florencia Reasons, MD   1 drop at 10/04/15 2253  . pantoprazole (PROTONIX) EC tablet 20 mg  20 mg Oral Daily Florencia Reasons, MD   20 mg at 10/04/15 1002  . rosuvastatin (CRESTOR) tablet 40 mg  40 mg Oral q1800 Florencia Reasons, MD   40 mg at 10/04/15 1724  . sodium chloride 0.9 % injection 3 mL  3 mL Intravenous Q12H Einar Pheasant Hager, PA-C      . sodium chloride 0.9 % injection 3 mL  3 mL Intravenous PRN Brett Canales, PA-C        PE: General appearance: alert, cooperative and no distress Neck: no carotid bruit and no JVD Lungs: clear to auscultation bilaterally Heart: regular rate and rhythm and 1/6 SM Extremities: no LEE Pulses: 2+ and symmetric Skin: warm and dry Neurologic: Grossly normal  Lab Results:   Recent Labs  10/03/15 0356 10/04/15 0315 10/05/15 0345  WBC 9.5 8.7 5.5  HGB 9.6*  9.4* 10.4*  HCT 28.0* 27.7* 31.4*  PLT 153 144* 155   BMET  Recent Labs  10/03/15 0356 10/04/15 0315 10/05/15 0345  NA 133* 134* 136  K 3.5 3.3* 3.6  CL 97* 96* 98*  CO2 28 29 30   GLUCOSE 162* 111* 113*  BUN 33* 28* 24*  CREATININE 1.20* 1.04* 1.08*  CALCIUM 8.6* 8.7* 9.2   Cardiac Panel (last 3 results)  Recent Labs  10/03/15 1227 10/03/15 2054 10/04/15 0300  TROPONINI 2.46* 2.25* 2.16*    Studies/Results: 10/03/15 Study Conclusions  - Left ventricle: The cavity size was normal. Wall thickness was normal. Systolic function was severely reduced. The estimated ejection fraction was in the range of 20% to 25%. Diffuse hypokinesis with inferolateral akinesis. E/medial e&' > 15 suggesting LV end diastolic pressurea at least 20 mmHg. Features are consistent with a pseudonormal left ventricular filling pattern, with concomitant abnormal relaxation and increased filling pressure (grade 2 diastolic dysfunction). - Aortic valve: There was no stenosis. There was trivial regurgitation. - Mitral valve: Mildly calcified annulus. Mildly calcified leaflets . There was moderate to severe regurgitation. The posterior leaflet was restricted. Valve area by pressure half-time: 4.4 cm^2. - Left atrium: The atrium was moderately dilated. - Right ventricle: The cavity size was normal. Systolic function was normal. - Tricuspid valve: Peak RV-RA gradient (S): 33 mm Hg. - Pulmonary arteries: PA peak pressure: 48 mm Hg (S). - Systemic veins: IVC measured 2.3 cm with < 50% respirophasic variation, suggesting RA pressure 15 mmHg.  Impressions:  - Normal LV size with EF 20-25%. Diffuse hypokinesis with inferolateral akinesis. Moderate diastolic dysfunction with evidence for elevated LV filling pressure. Normal RV size and systolic function. Moderate to severe MR with restricted posterior leaflet. Mild pulmonary hypertension.   Assessment/Plan     Principal Problem:   Acute on chronic systolic (congestive) heart failure (HCC) Active Problems:   Obesity   PVD (peripheral vascular disease) (HCC)   Chronic diastolic CHF (congestive heart failure) (HCC)   Acute on chronic renal insufficiency (HCC)   Aortic atherosclerosis (HCC)   Acute respiratory failure with hypoxia (HCC)   Non-ST elevation MI (NSTEMI) (HCC)   NSTEMI (non-ST elevated myocardial infarction) ([place])   Acute kidney injury superimposed on CKD ([place])    1. NSTEMI: troponin peaked at 2.46. Now with downward trend. EF down to 20-25%. Denies CP. Known h/o CAD/CABG. Plan for [facility] today  to reassess graft patency and native coronary anatomy. Continue IV heparin, ASA, Plavix, Coreg and Crestor.   2. Acute Combined Systolic + Diastolic CHF: new reduction in EF down to 20-25%. Plan for [facility] today +/- PCI. She appears euvolemic on physical exam. No further orthopnea. Currently on 80 mg PO lasix. Will hold AM dose for cath. Continue BB therapy with Coreg. Hold ACE/ARB initiation until post cath, to ensure renal function will remain stable. Low sodium diet and daily weights.   3. AKI: renal function improved. SCr down from 1.20 to 1.08 today. F/u BMP in the am, post cath.    LOS: 4 days    Leroi Haque M. Ladoris Gene 10/05/2015 7:47 AM

## 2015-10-05 NOTE — Progress Notes (Signed)
PROGRESS NOTE  GLENDALE [name] DOB: Apr 02, 1951 DOA: 10/01/2015 PCP: Lucretia Kern., DO  HPI/Recap of past 24 hours: She denies any complains about chest pain, feeling much better. Still on heparin drip, for cardiac catheterization today.  Assessment/Plan: Principal Problem:   Acute on chronic systolic (congestive) heart failure (HCC) Active Problems:   Obesity   PVD (peripheral vascular disease) (HCC)   Chronic diastolic CHF (congestive heart failure) (HCC)   Acute on chronic renal insufficiency (HCC)   Aortic atherosclerosis (HCC)   Acute respiratory failure with hypoxia (HCC)   Non-ST elevation MI (NSTEMI) (HCC)   NSTEMI (non-ST elevated myocardial infarction) ([place])   Acute kidney injury superimposed on CKD ([place])   Acute hypercapenic and hypoxic respiratory failure: From chf exacerbation. Initial abg with ph 7.19 CO2 62 p/f ration of 153.  Better off  bipap since 12/4, lung exam has much improved,  Received IV lasix 80mg  bid since admissiochanged to Lasix 80 mg by mouth daily.  Combined diastolic and systolic CHF exacerbation:  Ecshowed markedly decrease in LVEF from 55% about 5 years ago to 20-25% with grade 2 diastolic dysfunction. Cardiology consulted, cardiac catheterization on 12/7 to rule out ischemic cardiomyopathy. Patient has had CABG in 2005 and stents placed afterwards.   NSTEMI troponin peak at 4.8, treaded down, had one episode of chest pain in the hospital on 12/5 on heparin drip, was on nitroglycerin drip briefly on 12/5. Continue plavix, coreg, statin,  denies chest pain today, possible cardiac cath on 12/7.  Hyponatremia: Sodium was 130 on admission, this is dilutional hyponatremia secondary to CHF. This is resolved after diuresis and patient is euvolemic.   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  CAD s/p CABG: last cath in 2010. Currently denies chest pain, continue  asa/plavix/betablocker/statin/zetia  HTN:  on lasix, continue coreg, hold norvasc ( reported allergy?), prn hydralazine, was on imdur a few days (started and d/ced by cardiology)  Diet controlled diabetes, most recent a1c 6.3 a month ago, continue diet control. Am blood sugar elevated, started on ssi.  H/o renal artery stenosis s/p PTA of the left renal artery, avoid acei and arb.  H/o carotid artery stenosis s/o right CEA in 2013, Continue statins and aspirin  DVT prophylaxis: lovenox  Code Status: full  Family Communication: patient   Disposition Plan: likely able to go to telemetry.   Consultants:  cardiology  Procedures:  bipap 12/3 to 12/4  Antibiotics:  none   Objective: BP 118/51 mmHg  Pulse 73  Temp(Src) 98.2 F (36.8 C) (Oral)  Resp 22  Ht 5\' 4"  (1.626 m)  Wt 67.2 kg (148 lb 2.4 oz)  BMI 25.42 kg/m2  SpO2 100%  Intake/Output Summary (Last 24 hours) at 10/05/15 1106 Last data filed at 10/05/15 1000  Gross per 24 hour  Intake 445.67 ml  Output   3000 ml  Net -2554.33 ml   Filed Weights   10/02/15 0500 10/04/15 0347 10/05/15 0600  Weight: 73.4 kg (161 lb 13.1 oz) 71.2 kg (156 lb 15.5 oz) 67.2 kg (148 lb 2.4 oz)    Exam:   General:  NAD  Cardiovascular: RRR  Respiratory: much improved, now only mild crackles at basis  Abdomen: Soft/ND/NT, positive BS  Musculoskeletal: bilateral pedal  Edema has much improved  Neuro: aaox3, no focal deficit  Data Reviewed: Basic Metabolic Panel:  Recent Labs Lab 10/01/15 1815 10/02/15 0130 10/03/15 0356 10/04/15 0315 10/05/15 0345  NA 130* 133* 133* 134* 136  K 4.0 4.1 3.5 3.3* 3.6  CL 97* 99* 97* 96* 98*  CO2 24 22 28 29 30   GLUCOSE 180* 149* 162* 111* 113*  BUN 25* 29* 33* 28* 24*  CREATININE 0.86 1.10* 1.20* 1.04* 1.08*  CALCIUM 8.8* 9.2 8.6* 8.7* 9.2  MG  --   --  2.1  --   --    Liver Function Tests:  Recent Labs Lab 10/01/15 1815  AST 34  ALT 37  ALKPHOS 58  BILITOT 1.0    PROT 7.2  ALBUMIN 4.3   No results for input(s): LIPASE, AMYLASE in the last 168 hours. No results for input(s): AMMONIA in the last 168 hours. CBC:  Recent Labs Lab 10/01/15 1815 10/02/15 0110 10/03/15 0356 10/04/15 0315 10/05/15 0345  WBC 10.5 9.6 9.5 8.7 5.5  HGB 10.9* 11.5* 9.6* 9.4* 10.4*  HCT 32.1* 33.8* 28.0* 27.7* 31.4*  MCV 84.7 84.1 84.1 85.2 85.3  PLT 185 151 153 144* 155   Cardiac Enzymes:    Recent Labs Lab 10/02/15 2305 10/03/15 0356 10/03/15 1227 10/03/15 2054 10/04/15 0300  TROPONINI 4.84* 4.32* 2.46* 2.25* 2.16*   BNP (last 3 results)  Recent Labs  10/02/15 0110 10/03/15 0356 10/04/15 0315  BNP 2267.0* 1789.0* 1771.8*    ProBNP (last 3 results)  Recent Labs  04/01/15 0738  PROBNP 460.0*    CBG:  Recent Labs Lab 10/04/15 0733 10/04/15 1154 10/04/15 1700 10/04/15 2159 10/05/15 0746  GLUCAP 107* 178* 106* 148* 114*    Recent Results (from the past 240 hour(s))  MRSA PCR Screening     Status: None   Collection Time: 10/01/15  6:05 PM  Result Value Ref Range Status   MRSA by PCR NEGATIVE NEGATIVE Final    Comment:        The GeneXpert MRSA Assay (FDA approved for NASAL specimens only), is one component of a comprehensive MRSA colonization surveillance program. It is not intended to diagnose MRSA infection nor to guide or monitor treatment for MRSA infections.   Culture, expectorated sputum-assessment     Status: None   Collection Time: 10/03/15  5:00 PM  Result Value Ref Range Status   Specimen Description SPUTUM  Final   Special Requests NONE  Final   Sputum evaluation   Final    MICROSCOPIC FINDINGS SUGGEST THAT THIS SPECIMEN IS NOT REPRESENTATIVE OF LOWER RESPIRATORY SECRETIONS. PLEASE RECOLLECT. RESULT CALLED TO, READ BACK BY AND VERIFIED WITH: MICHELLE REEVES RN 12.5.16 @ 2000 BY RICEJ    Report Status 10/03/2015 FINAL  Final     Studies: No results found.  Scheduled Meds: . antiseptic oral rinse  7 mL  Mouth Rinse q12n4p  . aspirin EC  81 mg Oral Daily  . carvedilol  12.5 mg Oral BID WC  . chlorhexidine  15 mL Mouth Rinse BID  . clopidogrel  75 mg Oral Daily  . ezetimibe  10 mg Oral Daily  . feeding supplement (ENSURE ENLIVE)  237 mL Oral BID BM  . ferrous sulfate  325 mg Oral Q breakfast  . furosemide  80 mg Oral Daily  . insulin aspart  0-15 Units Subcutaneous TID WC  . olopatadine  1 drop Both Eyes BID  . pantoprazole  20 mg Oral Daily  . rosuvastatin  40 mg Oral q1800  . sodium chloride  3 mL Intravenous Q12H    Continuous Infusions: . sodium chloride 10 mL/hr at 10/05/15 1000  . heparin 700 Units/hr (  10/05/15 1000)  . nitroGLYCERIN Stopped (10/03/15 1737)     Time spent: 22mins  [facility] A MD, PhD  [facility] Pager [phone]. If 7PM-7AM, please contact night-coverage at [email or web address], password [facility] 10/05/2015, 11:06 AM  LOS: 4 days

## 2015-10-05 NOTE — Progress Notes (Signed)
ANTICOAGULATION CONSULT NOTE - Follow Up Consult  Pharmacy Consult:  Heparin  Indication:  ACS s/p cath  Allergies  Allergen Reactions  . Penicillins Rash    Has patient had a PCN reaction causing immediate rash, facial/tongue/throat swelling, SOB or lightheadedness with hypotension: Yes Has patient had a PCN reaction causing severe rash involving mucus membranes or skin necrosis: Yes Has patient had a PCN reaction that required hospitalization Yes- admitted to hospital Has patient had a PCN reaction occurring within the last 10 years: No, childhood allergy If all of the above answers are "NO", then may proceed with Cephalosporin use.   . Protamine     Internal bleeding around heart  . Amlodipine Swelling    Causes swelling in higher dosage    Patient Measurements: Height: 5\' 4"  (162.6 cm) Weight: 148 lb 2.4 oz (67.2 kg) IBW/kg (Calculated) : 54.7 Heparin Dosing Weight: 67 kg  Vital Signs: Temp: 98 F (36.7 C) (12/07 1140) Temp Source: Oral (12/07 1140) BP: 163/75 mmHg (12/07 1705) Pulse Rate: 0 (12/07 1719)  Labs:  Recent Labs  10/03/15 0356  10/03/15 1227 10/03/15 2054 10/04/15 0300 10/04/15 0315 10/05/15 0345  HGB 9.6*  --   --   --   --  9.4* 10.4*  HCT 28.0*  --   --   --   --  27.7* 31.4*  PLT 153  --   --   --   --  144* 155  HEPARINUNFRC  --   < > 0.61  --   --  0.44 0.72*  CREATININE 1.20*  --   --   --   --  1.04* 1.08*  TROPONINI 4.32*  --  2.46* 2.25* 2.16*  --   --   < > = values in this interval not displayed.  Estimated Creatinine Clearance: 48.3 mL/min (by C-G formula based on Cr of 1.08).     Assessment: 72 YOF with ACS s/p cath to resume IV heparin 8 hr post sheath removal.  Sheath was removed at 1745 and no bleeding nor hematoma was documented.   Goal of Therapy:  Heparin level 0.3-0.7 units/ml Monitor platelets by anticoagulation protocol: Yes    Plan:  - At [place] on 10/06/15, resume heparin gtt at 700 units/hr - Check 6 hr HL -  Daily HL / CBC    Blakely Gluth D. Mina Marble, PharmD, BCPS Pager:  [phone] 10/05/2015, 6:22 PM

## 2015-10-05 NOTE — Progress Notes (Signed)
ANTICOAGULATION CONSULT NOTE - F/u Consult  Pharmacy Consult for heparin Indication: chest pain/ACS  Allergies  Allergen Reactions  . Penicillins Rash    Has patient had a PCN reaction causing immediate rash, facial/tongue/throat swelling, SOB or lightheadedness with hypotension: Yes Has patient had a PCN reaction causing severe rash involving mucus membranes or skin necrosis: Yes Has patient had a PCN reaction that required hospitalization Yes- admitted to hospital Has patient had a PCN reaction occurring within the last 10 years: No, childhood allergy If all of the above answers are "NO", then may proceed with Cephalosporin use.   . Protamine     Internal bleeding around heart  . Amlodipine Swelling    Causes swelling in higher dosage    Patient Measurements: Height: 5\' 4"  (162.6 cm) Weight: 148 lb 2.4 oz (67.2 kg) IBW/kg (Calculated) : 54.7 Heparin Dosing Weight: actual body weight  Vital Signs: Temp: 98.1 F (36.7 C) (12/07 0312) Temp Source: Oral (12/07 0312) BP: 156/68 mmHg (12/07 0800) Pulse Rate: 76 (12/07 0800)  Labs:  Recent Labs  10/03/15 0356  10/03/15 1227 10/03/15 2054 10/04/15 0300 10/04/15 0315 10/05/15 0345  HGB 9.6*  --   --   --   --  9.4* 10.4*  HCT 28.0*  --   --   --   --  27.7* 31.4*  PLT 153  --   --   --   --  144* 155  HEPARINUNFRC  --   < > 0.61  --   --  0.44 0.72*  CREATININE 1.20*  --   --   --   --  1.04* 1.08*  TROPONINI 4.32*  --  2.46* 2.25* 2.16*  --   --   < > = values in this interval not displayed.  Estimated Creatinine Clearance: 48.3 mL/min (by C-G formula based on Cr of 1.08).   Medical History: Past Medical History  Diagnosis Date  . Hyperlipidemia   . Hypertension   . Arthritis   . Anemia   . Diastolic HF (heart failure) ([place])     EF 55% by cath 2010  . Bipolar affective disorder ([place])   . GERD (gastroesophageal reflux disease)   . Obesity   . Peripheral neuropathy ([place])   . Renovascular hypertension    s/p PTA of left renal artery  . 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 Coronary atherosclerosis     3 vessel s/p CABG - cath 2005 with widely patent grafts and distal disease past the grafts, cath 2010 with occluded SVG to IM and SVG to OM/PDA with PTCA of left circ and PCI of SVG to OM/PDA  . Carotid artery stenosis     60-79% bilateral ICA stenosis s/p Right CEA 2013  followed by Dr. Donnetta Hutching    Assessment: 58 y/oF presenting 12/3 with shortness of breath d/t acute HF exacerbation, c/o chest pain 12/4 with rise in troponin. Has known CAD. Pharmacy asked to dose heparin for rule out NSTEMI vs. demand ischemia.  Today, 10/05/2015   CBC: Hgb low/stable, Pltc WNL  SCr increased slightly  HL slightly supratherapeutic at 0.72 on heparin infusion at rate of 750 units/hr  No reported bleeding or line complications per nursing  Goal of Therapy:  Heparin level 0.3-0.7 units/ml Monitor platelets by anticoagulation protocol: Yes   Plan:   Decrease heparin infusion rate to 700 units/hr  Heparin level 6 hours after rate change (unless infusion  turned off before then).  Daily heparin level and CBC while on IV heparin  Plan for [facility] per Cardiology today. Heparin infusion to be d/c'ed on call to cath lab per orders.  F/u plans post-cath.   Lindell Spar, PharmD, BCPS Pager: [phone] 10/05/2015 9:23 AM

## 2015-10-05 NOTE — Progress Notes (Signed)
Site area: Right groin a 5 french sheath was removed  Site Prior to Removal:  Level 0  Pressure Applied For 15 MINUTES    Minutes Beginning at 1745p  Manual:   Yes.    Patient Status During Pull:  stable  Post Pull Groin Site:  Level 0  Post Pull Instructions Given:  Yes.    Post Pull Pulses Present:  Yes.    Dressing Applied:  Yes.    Comments:  VS remain stable during sheath pull

## 2015-10-06 ENCOUNTER — Other Ambulatory Visit: Payer: Self-pay | Admitting: Cardiology

## 2015-10-06 ENCOUNTER — Encounter (HOSPITAL_COMMUNITY): Payer: Self-pay | Admitting: Cardiovascular Disease

## 2015-10-06 DIAGNOSIS — I2510 Atherosclerotic heart disease of native coronary artery without angina pectoris: Secondary | ICD-10-CM

## 2015-10-06 LAB — BASIC METABOLIC PANEL
Anion gap: 10 (ref 5–15)
BUN: 26 mg/dL — AB (ref 6–20)
CALCIUM: 9.1 mg/dL (ref 8.9–10.3)
CO2: 28 mmol/L (ref 22–32)
CREATININE: 1.12 mg/dL — AB (ref 0.44–1.00)
Chloride: 98 mmol/L — ABNORMAL LOW (ref 101–111)
GFR calc non Af Amer: 50 mL/min — ABNORMAL LOW (ref >60)
GFR, EST AFRICAN AMERICAN: 58 mL/min — AB (ref >60)
Glucose, Bld: 122 mg/dL — ABNORMAL HIGH (ref 65–99)
Potassium: 3.8 mmol/L (ref 3.5–5.1)
SODIUM: 136 mmol/L (ref 135–145)

## 2015-10-06 LAB — GLUCOSE, CAPILLARY
GLUCOSE-CAPILLARY: 125 mg/dL — AB (ref 65–99)
Glucose-Capillary: 90 mg/dL (ref 65–99)
Glucose-Capillary: 108 mg/dL — ABNORMAL HIGH (ref 65–99)
Glucose-Capillary: 181 mg/dL — ABNORMAL HIGH (ref 65–99)
Glucose-Capillary: 153 mg/dL — ABNORMAL HIGH (ref 65–99)

## 2015-10-06 LAB — CBC
HEMATOCRIT: 32.1 % — AB (ref 36.0–46.0)
HEMOGLOBIN: 10.3 g/dL — AB (ref 12.0–15.0)
MCH: 27.3 pg (ref 26.0–34.0)
MCHC: 32.1 g/dL (ref 30.0–36.0)
MCV: 85.1 fL (ref 78.0–100.0)
Platelets: 166 10*3/uL (ref 150–400)
RBC: 3.77 MIL/uL — AB (ref 3.87–5.11)
RDW: 14.2 % (ref 11.5–15.5)
WBC: 5.2 10*3/uL (ref 4.0–10.5)

## 2015-10-06 LAB — HEPARIN LEVEL (UNFRACTIONATED)
Heparin Unfractionated: 0.21 IU/mL — ABNORMAL LOW (ref 0.30–0.70)
Heparin Unfractionated: 0.27 IU/mL — ABNORMAL LOW (ref 0.30–0.70)

## 2015-10-06 MED ORDER — EZETIMIBE 10 MG PO TABS
10.0000 mg | ORAL_TABLET | Freq: Every day | ORAL | Status: DC
  Administered 2015-10-06 – 2015-10-09 (×4): 10 mg via ORAL
  Filled 2015-10-06 (×4): qty 1

## 2015-10-06 NOTE — Progress Notes (Signed)
ANTICOAGULATION CONSULT NOTE - Follow Up Consult  Pharmacy Consult:  Heparin  Indication:  ACS s/p cath  Allergies  Allergen Reactions  . Penicillins Rash    Has patient had a PCN reaction causing immediate rash, facial/tongue/throat swelling, SOB or lightheadedness with hypotension: Yes Has patient had a PCN reaction causing severe rash involving mucus membranes or skin necrosis: Yes Has patient had a PCN reaction that required hospitalization Yes- admitted to hospital Has patient had a PCN reaction occurring within the last 10 years: No, childhood allergy If all of the above answers are "NO", then may proceed with Cephalosporin use.   . Protamine     Internal bleeding around heart  . Amlodipine Swelling    Causes swelling in higher dosage    Patient Measurements: Height: 5\' 3"  (160 cm) Weight: 153 lb 14.1 oz (69.8 kg) IBW/kg (Calculated) : 52.4 Heparin Dosing Weight: 67 kg  Vital Signs: Temp: 98.5 F (36.9 C) (12/08 0700) Temp Source: Oral (12/08 0700) BP: 137/63 mmHg (12/08 0700) Pulse Rate: 78 (12/08 0700)  Labs:  Recent Labs  10/03/15 1227 10/03/15 2054 10/04/15 0300  10/04/15 0315 10/05/15 0345 10/06/15 0234 10/06/15 0825  HGB  --   --   --   < > 9.4* 10.4*  --  10.3*  HCT  --   --   --   --  27.7* 31.4*  --  32.1*  PLT  --   --   --   --  144* 155  --  166  HEPARINUNFRC 0.61  --   --   --  0.44 0.72*  --  0.21*  CREATININE  --   --   --   --  1.04* 1.08* 1.12*  --   TROPONINI 2.46* 2.25* 2.16*  --   --   --   --   --   < > = values in this interval not displayed.  Estimated Creatinine Clearance: 46.3 mL/min (by C-G formula based on Cr of 1.12).  Assessment: 29 YOF with ACS s/p cath back on IV heparin. Patient with complex multivessel disease and failed graft. She is currently being evaluated for possible redo CABG vs high risk pci.  Heparin level is low this am after restart, will adjust rate. No bleeding issues noted overnight, CBC stable.  Goal of  Therapy:  Heparin level 0.3-0.7 units/ml Monitor platelets by anticoagulation protocol: Yes    Plan:  Increase heparin to 800 units/hr 6 hours HL Follow cardiac plan  Erin Hearing PharmD., BCPS Clinical Pharmacist Pager [phone] 10/06/2015 9:46 AM

## 2015-10-06 NOTE — Progress Notes (Signed)
Pharmacist Heart Failure Core Measure Documentation  Assessment: Penny James has an EF documented as 20-25%  Rationale: Heart failure patients with left ventricular systolic dysfunction (LVSD) and an EF < 40% should be prescribed an angiotensin converting enzyme inhibitor (ACEI) or angiotensin receptor blocker (ARB) at discharge unless a contraindication is documented in the medical record.  This patient is not currently on an ACEI or ARB for HF.  This note is being placed in the record in order to provide documentation that a contraindication to the use of these agents is present for this encounter.  ACE Inhibitor or Angiotensin Receptor Blocker is contraindicated (specify all that apply)  []   ACEI allergy AND ARB allergy []   Angioedema []   Moderate or severe aortic stenosis []   Hyperkalemia []   Hypotension [x]   Renal artery stenosis []   Worsening renal function, preexisting renal disease or dysfunction   [name] 10/06/2015 8:18 AM

## 2015-10-06 NOTE — Progress Notes (Signed)
ANTICOAGULATION CONSULT NOTE - Follow Up Consult  Pharmacy Consult:  Heparin  Indication:  ACS s/p cath  Allergies  Allergen Reactions  . Penicillins Rash    Has patient had a PCN reaction causing immediate rash, facial/tongue/throat swelling, SOB or lightheadedness with hypotension: Yes Has patient had a PCN reaction causing severe rash involving mucus membranes or skin necrosis: Yes Has patient had a PCN reaction that required hospitalization Yes- admitted to hospital Has patient had a PCN reaction occurring within the last 10 years: No, childhood allergy If all of the above answers are "NO", then may proceed with Cephalosporin use.   . Protamine     Internal bleeding around heart  . Amlodipine Swelling    Causes swelling in higher dosage    Patient Measurements: Height: 5\' 3"  (160 cm) Weight: 153 lb 14.1 oz (69.8 kg) IBW/kg (Calculated) : 52.4 Heparin Dosing Weight: 67 kg  Vital Signs: Temp: 97.7 F (36.5 C) (12/08 1656) Temp Source: Oral (12/08 1656) BP: 108/63 mmHg (12/08 1600) Pulse Rate: 79 (12/08 1600)  Labs:  Recent Labs  10/03/15 2054 10/04/15 0300  10/04/15 0315 10/05/15 0345 10/06/15 0234 10/06/15 0825 10/06/15 1635  HGB  --   --   < > 9.4* 10.4*  --  10.3*  --   HCT  --   --   --  27.7* 31.4*  --  32.1*  --   PLT  --   --   --  144* 155  --  166  --   HEPARINUNFRC  --   --   < > 0.44 0.72*  --  0.21* 0.27*  CREATININE  --   --   --  1.04* 1.08* 1.12*  --   --   TROPONINI 2.25* 2.16*  --   --   --   --   --   --   < > = values in this interval not displayed.  Estimated Creatinine Clearance: 46.3 mL/min (by C-G formula based on Cr of 1.12).  Assessment: 73 YOF with ACS s/p cath back on IV heparin. Patient with complex multivessel disease and failed graft. She is currently being evaluated for possible redo CABG vs high risk pci.  Heparin level is low this am after restart, will adjust rate. No bleeding issues noted overnight, CBC stable.  Goal of  Therapy:  Heparin level 0.3-0.7 units/ml Monitor platelets by anticoagulation protocol: Yes    Plan:  Increase heparin to 800 units/hr 6 hours HL Follow cardiac plan  Erin Hearing PharmD., BCPS Clinical Pharmacist Pager [phone] 10/06/2015 7:31 PM   Addendum -heparin level remains subtherapeutic  -increase gtt to 950 units/hr -Next level with am labs  Harvel Quale  10/06/2015 .7:32 PM

## 2015-10-06 NOTE — Care Management Important Message (Signed)
Important Message  Patient Details  Name: Penny James MRN: [id number] Date of Birth: 02-09-1951   Medicare Important Message Given:  Yes    Kraig Genis P [place] 10/06/2015, 11:11 AM

## 2015-10-06 NOTE — Progress Notes (Addendum)
Nutrition Brief Note  Patient identified on the Malnutrition Screening Tool (MST) Report  Wt Readings from Last 15 Encounters:  10/05/15 153 lb 14.1 oz (69.8 kg)  09/21/15 159 lb (72.122 kg)  09/10/15 159 lb 12.8 oz (72.485 kg)  08/03/15 172 lb 1.6 oz (78.064 kg)  04/08/15 168 lb (76.204 kg)  02/02/15 166 lb (75.297 kg)  10/04/14 163 lb (73.936 kg)  09/28/14 156 lb 12.8 oz (71.124 kg)  09/03/14 165 lb (74.844 kg)  09/02/14 160 lb (72.576 kg)  08/03/14 160 lb (72.576 kg)  05/04/14 168 lb (76.204 kg)  04/14/14 168 lb 6.4 oz (76.386 kg)  02/02/14 173 lb (78.472 kg)  01/13/14 175 lb 4 oz (79.493 kg)   Pt admitted with CHF exacerbation.   Pt reports good appetite both currently and PTA. Reports wt stability, but reveals "they took 15 pounds of fluid off me" as a result of diuretics.   She reports that she follows a Heart Healthy/ DM diet at home and has no further questions at this time.   Body mass index is 27.27 kg/(m^2). Patient meets criteria for overweight based on current BMI.   Current diet order is Heart Healthy/ Carb Modified, patient is consuming approximately 75-100% of meals at this time. Labs and medications reviewed.   No nutrition interventions warranted at this time. If nutrition issues arise, please consult RD.   Penny James, RD, LDN, CDE Pager: [phone] After hours Pager: [phone]

## 2015-10-06 NOTE — Progress Notes (Addendum)
1 Day Post-Op Procedure(s) (LRB): Left Heart Cath and Cors/Grafts Angiography (N/A) Subjective: Patient examined, 2-D echocardiogram and left heart catheterization independently reviewed and counseled with patient. The patient is status post CABG 5 in 2002 for acute MI by Dr. Cyndia James  [name] with second admission in the past 3 weeks for congestive heart failure. On this admission her cardiac enzymes are positive consistent with non-ST elevation MI. Chest x-ray on admission showed interstitial edema. Symptomatically she is improved with diuretics and a weight loss of over 10 pounds. Transthoracic echocardiogram demonstrates severe LV dysfunction, ejection fraction 20% with ischemic MR moderate-severe. In 2010 a Myoview scan demonstrated EF of 50%. Left heart cath shows occluded vein grafts to the diagonal, ramus, and distal circumflex and a patent left IMA to the LAD with distal LAD disease at the apex. The native vessels in the circumflex and right coronary distribution are poor targets for grafting. LVEDP is 12. Moderate-severe mitral regurgitation directed posteriorly from a tethered posterior leaflet.  Objective: Vital signs in last 24 hours: Temp:  [98 F (36.7 C)-98.7 F (37.1 C)] 98.7 F (37.1 C) (12/08 1100) Pulse Rate:  [0-91] 78 (12/08 1100) Cardiac Rhythm:  [-] Normal sinus rhythm (12/08 0701) Resp:  [0-28] 22 (12/08 1100) BP: (110-164)/(51-84) 121/55 mmHg (12/08 1100) SpO2:  [0 %-100 %] 99 % (12/08 1100) Weight:  [153 lb 14.1 oz (69.8 kg)] 153 lb 14.1 oz (69.8 kg) (12/07 1815)  Hemodynamic parameters for last 24 hours:    Review of systems  No recent URI symptoms or pneumonia Right-hand dominant Previous right carotid endarterectomy with residual 70% left carotid stenosis and previous history of stroke Borderline diabetes mellitus No history of cancer or hematologic problems Positive history of GERD-reflux History of  hypertension    Intake/Output from previous day: 12/07 0701 - 12/08 0700 In: 615.8 [P.O.:30; I.V.:585.8] Out: 33 [Urine:820] Intake/Output this shift: Total I/O In: 400 [P.O.:400] Out: -       Physical Exam  General: Middle-aged AA female out of bed and chair in CCU no acute distress  HEENT: Normocephalic pupils equal , dentition adequate Neck: Supple without JVD, adenopathy, soft  Bruit over left carotid-right carotid endarterectomy scar  Chest: Clear to auscultation, symmetrical breath sounds, no rhonchi, no tenderness             or deformity. Well-healed sternotomy surgical scar.  Cardiovascular: Regular rate and rhythm,  2/6 holosystolic murmur, + S4 gallop, peripheral pulses are non-palpable             in  lower extremities.  Abdomen:  Soft, nontender, no palpable mass or organomegaly Extremities: Warm, well-perfused, no clubbing cyanosis edema or tenderness,              no venous stasis changes of the legs Rectal/GU: Deferred Neuro: Grossly non--focal and symmetrical throughout Skin: Clean and dry without rash or ulceration   Lab Results:  Recent Labs  10/05/15 0345 10/06/15 0825  WBC 5.5 5.2  HGB 10.4* 10.3*  HCT 31.4* 32.1*  PLT 155 166   BMET:  Recent Labs  10/05/15 0345 10/06/15 0234  NA 136 136  K 3.6 3.8  CL 98* 98*  CO2 30 28  GLUCOSE 113* 122*  BUN 24* 26*  CREATININE 1.08* 1.12*  CALCIUM 9.2 9.1    PT/INR: No results for input(s): LABPROT, INR in the last 72 hours. 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  Recommend PCI of her left main and proximal coronary disease and medical therapy of her ischemic MR as best long-term therapy for this patient  Because of her severe protamine allergy she would also not be a candidate for implantable VAD.    LOS: 5 days    Penny James III 10/06/2015

## 2015-10-07 DIAGNOSIS — I25810 Atherosclerosis of coronary artery bypass graft(s) without angina pectoris: Secondary | ICD-10-CM

## 2015-10-07 LAB — CBC
HEMATOCRIT: 28.6 % — AB (ref 36.0–46.0)
HEMOGLOBIN: 9.8 g/dL — AB (ref 12.0–15.0)
MCH: 28.9 pg (ref 26.0–34.0)
MCHC: 34.3 g/dL (ref 30.0–36.0)
MCV: 84.4 fL (ref 78.0–100.0)
Platelets: 152 10*3/uL (ref 150–400)
RBC: 3.39 MIL/uL — AB (ref 3.87–5.11)
RDW: 14.2 % (ref 11.5–15.5)
WBC: 4.3 10*3/uL (ref 4.0–10.5)

## 2015-10-07 LAB — GLUCOSE, CAPILLARY
GLUCOSE-CAPILLARY: 123 mg/dL — AB (ref 65–99)
GLUCOSE-CAPILLARY: 95 mg/dL (ref 65–99)
Glucose-Capillary: 118 mg/dL — ABNORMAL HIGH (ref 65–99)
Glucose-Capillary: 123 mg/dL — ABNORMAL HIGH (ref 65–99)

## 2015-10-07 LAB — PLATELET INHIBITION P2Y12: PLATELET FUNCTION P2Y12: 279 [PRU] (ref 194–418)

## 2015-10-07 LAB — BASIC METABOLIC PANEL
ANION GAP: 9 (ref 5–15)
BUN: 25 mg/dL — ABNORMAL HIGH (ref 6–20)
CHLORIDE: 97 mmol/L — AB (ref 101–111)
CO2: 27 mmol/L (ref 22–32)
Calcium: 8.9 mg/dL (ref 8.9–10.3)
Creatinine, Ser: 1.22 mg/dL — ABNORMAL HIGH (ref 0.44–1.00)
GFR, EST AFRICAN AMERICAN: 52 mL/min — AB (ref >60)
GFR, EST NON AFRICAN AMERICAN: 45 mL/min — AB (ref >60)
Glucose, Bld: 102 mg/dL — ABNORMAL HIGH (ref 65–99)
POTASSIUM: 3.5 mmol/L (ref 3.5–5.1)
SODIUM: 133 mmol/L — AB (ref 135–145)

## 2015-10-07 LAB — HEPARIN LEVEL (UNFRACTIONATED): HEPARIN UNFRACTIONATED: 0.61 [IU]/mL (ref 0.30–0.70)

## 2015-10-07 MED ORDER — CARVEDILOL 6.25 MG PO TABS: 18.3750 mg | ORAL_TABLET | Freq: Two times a day (BID) | ORAL | Status: DC

## 2015-10-07 MED ORDER — LANSOPRAZOLE 15 MG PO CPDR
15.0000 mg | DELAYED_RELEASE_CAPSULE | Freq: Every day | ORAL | Status: DC
Start: 2015-10-07 — End: 2015-10-07
  Administered 2015-10-07: 15 mg via ORAL

## 2015-10-07 MED ORDER — CLOPIDOGREL BISULFATE 75 MG PO TABS
75.0000 mg | ORAL_TABLET | Freq: Every day | ORAL | Status: DC
  Administered 2015-10-07 – 2015-10-10 (×4): 75 mg via ORAL

## 2015-10-07 MED ORDER — CARVEDILOL 25 MG PO TABS: 12.5000 mg | ORAL_TABLET | Freq: Two times a day (BID) | ORAL | Status: DC

## 2015-10-07 MED ORDER — ENOXAPARIN SODIUM 40 MG/0.4ML ~~LOC~~ SOLN
40.0000 mg | Freq: Every day | SUBCUTANEOUS | Status: DC
  Administered 2015-10-07 – 2015-10-10 (×4): 40 mg via SUBCUTANEOUS
  Filled 2015-10-07 (×4): qty 0.4

## 2015-10-07 MED ORDER — FUROSEMIDE 40 MG PO TABS
80.0000 mg | ORAL_TABLET | Freq: Every day | ORAL | Status: DC
  Administered 2015-10-07: 80 mg via ORAL

## 2015-10-07 MED ORDER — PANTOPRAZOLE SODIUM 20 MG PO TBEC
20.0000 mg | DELAYED_RELEASE_TABLET | Freq: Every day | ORAL | Status: DC
  Administered 2015-10-08 – 2015-10-10 (×3): 20 mg via ORAL
  Filled 2015-10-07 (×3): qty 1

## 2015-10-07 MED ORDER — HYDRALAZINE HCL 25 MG PO TABS
25.0000 mg | ORAL_TABLET | Freq: Three times a day (TID) | ORAL | Status: DC
  Administered 2015-10-07 – 2015-10-10 (×10): 25 mg via ORAL
  Filled 2015-10-07 (×10): qty 1

## 2015-10-07 MED ORDER — ISOSORBIDE MONONITRATE ER 60 MG PO TB24
60.0000 mg | ORAL_TABLET | Freq: Every day | ORAL | Status: DC
  Administered 2015-10-08 – 2015-10-10 (×3): 60 mg via ORAL
  Filled 2015-10-07 (×3): qty 1

## 2015-10-07 MED ORDER — CARVEDILOL 6.25 MG PO TABS
18.3750 mg | ORAL_TABLET | Freq: Two times a day (BID) | ORAL | Status: DC
  Administered 2015-10-07 – 2015-10-10 (×6): 18.75 mg via ORAL
  Filled 2015-10-07 (×6): qty 1

## 2015-10-07 MED ORDER — FUROSEMIDE 80 MG PO TABS
80.0000 mg | ORAL_TABLET | Freq: Two times a day (BID) | ORAL | Status: DC
  Administered 2015-10-07 – 2015-10-08 (×2): 80 mg via ORAL
  Filled 2015-10-07 (×2): qty 1

## 2015-10-07 NOTE — Progress Notes (Signed)
ANTICOAGULATION CONSULT NOTE - Follow Up Consult  Pharmacy Consult:  Heparin  Indication:  ACS s/p cath  Allergies  Allergen Reactions  . Penicillins Rash    Has patient had a PCN reaction causing immediate rash, facial/tongue/throat swelling, SOB or lightheadedness with hypotension: Yes Has patient had a PCN reaction causing severe rash involving mucus membranes or skin necrosis: Yes Has patient had a PCN reaction that required hospitalization Yes- admitted to hospital Has patient had a PCN reaction occurring within the last 10 years: No, childhood allergy If all of the above answers are "NO", then may proceed with Cephalosporin use.   . Protamine     Severe hypotension Internal bleeding around heart  . Amlodipine Swelling    Causes swelling in higher dosage    Patient Measurements: Height: 5\' 3"  (160 cm) Weight: 153 lb 14.1 oz (69.8 kg) IBW/kg (Calculated) : 52.4 Heparin Dosing Weight: 67 kg  Vital Signs: Temp: 98.9 F (37.2 C) (12/09 0730) Temp Source: Oral (12/09 0730) BP: 134/84 mmHg (12/09 0600) Pulse Rate: 81 (12/09 0600)  Labs:  Recent Labs  10/05/15 0345 10/06/15 0234 10/06/15 0825 10/06/15 1635 10/07/15 0228  HGB 10.4*  --  10.3*  --  9.8*  HCT 31.4*  --  32.1*  --  28.6*  PLT 155  --  166  --  152  HEPARINUNFRC 0.72*  --  0.21* 0.27* 0.61  CREATININE 1.08* 1.12*  --   --  1.22*    Estimated Creatinine Clearance: 42.5 mL/min (by C-G formula based on Cr of 1.22).  Assessment: 26 YOF with ACS s/p cath back on IV heparin. Patient with complex multivessel disease and failed graft. She is currently being evaluated for possible redo CABG vs high risk pci.  Heparin level this morning is within goal at 0.61, cbc stable, no complications noted overnight. Give rise in level with recheck today to confirm patient is not accumulating.  D/t severe protamine allergy patient not felt to be a redo CABG candidate and surgery recommends high risk PCI.  D/w patient  the use of her home meds, she is very adamant about only using "Brand" medications which we do not stock in the hospital. I have explained our policy and comprimised with her in that if she can provide the medications we will repackage them and dispense them.  Goal of Therapy:  Heparin level 0.3-0.7 units/ml Monitor platelets by anticoagulation protocol: Yes   Plan:  Continue heparin at 900 units/hr Recheck HL this afternoon Follow cardiac plan  Erin Hearing PharmD., BCPS Clinical Pharmacist Pager [phone] 10/07/2015 8:02 AM

## 2015-10-07 NOTE — Progress Notes (Addendum)
Patient Name: Penny James Date of Encounter: 10/07/2015    SUBJECTIVE: The patient had coronary angiography yesterday. She is sitting this morning having suffered no complications related to the procedure. She denies dyspnea and chest discomfort.  Angiography demonstrated all saphenous vein grafts are occluded and that the LIMA to LAD is widely patent but LAD is diffusely disease both antegrade and retrograde from the LIMA anastomosis. Native right coronary is totally occluded. Heavily calcified left main 75% stenosis and 90% ostial LAD. The circumflex is large and diffusely diseased distally. There is severe left ventricular dysfunction with EF 25% and moderate mitral regurgitation.  She was seen by cardiac surgery, Dr. Prescott Gum, and is not a repeat surgical candidate because of a severe protamine reaction and poor targets.  Images reviewed with Dr. Claiborne Billings. PCI would be high risk but clinically indicated. Would likely need left ventricular assist device (Impela). Left main and ostial LAD and circumflex would likely need rotational atherectomy as the coronaries are heavily calcified.  TELEMETRY:  Normal sinus rhythm Filed Vitals:   10/07/15 0700 10/07/15 0730 10/07/15 0800 10/07/15 0900  BP: 131/63  133/87 133/53  Pulse: 77  81 75  Temp:  98.9 F (37.2 C)    TempSrc:  Oral    Resp: 23  15 21   Height:      Weight:      SpO2: 100%  97% 97%    Intake/Output Summary (Last 24 hours) at 10/07/15 1007 Last data filed at 10/07/15 0942  Gross per 24 hour  Intake 1341.21 ml  Output      0 ml  Net 1341.21 ml   LABS: Basic Metabolic Panel:  Recent Labs  10/06/15 0234 10/07/15 0228  NA 136 133*  K 3.8 3.5  CL 98* 97*  CO2 28 27  GLUCOSE 122* 102*  BUN 26* 25*  CREATININE 1.12* 1.22*  CALCIUM 9.1 8.9   CBC:  Recent Labs  10/06/15 0825 10/07/15 0228  WBC 5.2 4.3  HGB 10.3* 9.8*  HCT 32.1* 28.6*  MCV 85.1 84.4  PLT 166 152     Radiology/Studies:    Chest x-ray from 10/03/2015:IMPRESSION: Less interstitial edema. Persistent patchy opacity in both lung bases, likely edema. Somewhat greater opacity in the right upper lobe may represent edema, although superimposed pneumonia must be of concern in this area. No new opacity identified. Stable cardiomegaly with mild pulmonary venous hypertension  Physical Exam: Blood pressure 133/53, pulse 75, temperature 98.9 F (37.2 C), temperature source Oral, resp. rate 21, height 5\' 3"  (1.6 m), weight 153 lb 14.1 oz (69.8 kg), SpO2 97 %. Weight change:   Wt Readings from Last 3 Encounters:  10/05/15 153 lb 14.1 oz (69.8 kg)  09/21/15 159 lb (72.122 kg)  09/10/15 159 lb 12.8 oz (72.485 kg)   Clear basilar breath sounds. No wheezing is heard. Heart sounds are distant. An apical systolic murmur is heard. No obvious gallop. No access site hematoma (right femoral)  ASSESSMENT:  1. Acute on chronic systolic heart failure due to ischemic cardiomyopathy, improved with diuresis. 2. 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 Patent LIMA to LAD which is diffusely diseased antegrade and retrograde to the graft insertion site.. Question now is whether high risk PCI should be performed. I have briefly discussed this with the patient who seems to understand that she is not an emergency surgical candidate if PCI fails and that left ventricular assist device (Impella) would be required. She  currently feels better than on admission and our initial approach will be medical therapy. If she has intractable angina with activity, we will arrange for high risk PCI (CHIP) involving left main, LAD, and circumflex bifurcation. 3. Acute on chronic kidney injury 4. Diabetes mellitus, type II with complications  Plan:  1. Transition to oral medications 2. Ambulate and monitor symptoms. 3. Gentle hydration and follow kidney function for evidence of acute kidney injury from contrast 4. Final decision  concerning supported high-risk PCI based upon symptoms on aggressive medical therapy. If symptoms are acceptable, a conservative management strategy is probably best at this time. 5. Diuretic regimen is aggressive compared admission. May need downward adjustment.  Valerie Roys W 10/07/2015, 10:07 AM

## 2015-10-07 NOTE — Telephone Encounter (Signed)
° °  No gerneric brand drugs    *STAT* If patient is at the pharmacy, call can be transferred to refill team.   1. Which medications need to be refilled? (please list name of each medication and dose if known)  imdur and laxis 2. Which pharmacy/location (including street and city if local pharmacy) is medication to be sent to?   [facility]  3. Do they need a 30 day or 90 day supply? [place]

## 2015-10-07 NOTE — Progress Notes (Signed)
Patient is on room air with O2 Sat 98%. BIPAP is not needed at this time.

## 2015-10-07 NOTE — Progress Notes (Signed)
CARDIAC REHAB PHASE I   PRE:  Rate/Rhythm: 80 sR  BP:  Sitting: 137/60        SaO2: 100 RA  MODE:  Ambulation: 350 ft   POST:  Rate/Rhythm: 91 SR  BP:  Sitting: 127/54         SaO2: 100 RA  Pt ambulated 350 ft on RA, independent, steady gait, tolerated well.  Pt denies cp, dizziness, DOE, declined rest stop. Began MI/CHF education.  Reviewed risk factors, MI book, anti-platelet therapy, activity restrictions, ntg, CHF booklet and zone tool, daily weights, sodium and fluid restrictions and phase 2 cardiac rehab. Gave pt brochure for Phase 2 cardiac rehab, [place]. Gave heart healthy, low sodium and diabetes diet handouts to review as well. Pt verbalized understanding. Pt agrees to phase 2 cardiac rehab referral when appropriate. Will wait until pt closer to discharge to place order and send referral. Pt very receptive to education, able to perform teach back. Pt to recliner after walk, call bell within reach.  Will follow.  [id number]   Lenna Sciara, RN, BSN 10/07/2015 2:35 PM

## 2015-10-08 LAB — GLUCOSE, CAPILLARY
GLUCOSE-CAPILLARY: 130 mg/dL — AB (ref 65–99)
GLUCOSE-CAPILLARY: 122 mg/dL — AB (ref 65–99)
GLUCOSE-CAPILLARY: 131 mg/dL — AB (ref 65–99)
Glucose-Capillary: 120 mg/dL — ABNORMAL HIGH (ref 65–99)

## 2015-10-08 LAB — BASIC METABOLIC PANEL
ANION GAP: 10 (ref 5–15)
BUN: 23 mg/dL — AB (ref 6–20)
CHLORIDE: 98 mmol/L — AB (ref 101–111)
CO2: 26 mmol/L (ref 22–32)
Calcium: 9.1 mg/dL (ref 8.9–10.3)
Creatinine, Ser: 1.14 mg/dL — ABNORMAL HIGH (ref 0.44–1.00)
GFR calc Af Amer: 57 mL/min — ABNORMAL LOW (ref >60)
GFR calc non Af Amer: 49 mL/min — ABNORMAL LOW (ref >60)
GLUCOSE: 120 mg/dL — AB (ref 65–99)
POTASSIUM: 3.9 mmol/L (ref 3.5–5.1)
Sodium: 134 mmol/L — ABNORMAL LOW (ref 135–145)

## 2015-10-08 LAB — CBC
HEMATOCRIT: 27 % — AB (ref 36.0–46.0)
HEMOGLOBIN: 9.2 g/dL — AB (ref 12.0–15.0)
MCH: 28.6 pg (ref 26.0–34.0)
MCHC: 34.1 g/dL (ref 30.0–36.0)
MCV: 83.9 fL (ref 78.0–100.0)
Platelets: 151 10*3/uL (ref 150–400)
RBC: 3.22 MIL/uL — ABNORMAL LOW (ref 3.87–5.11)
RDW: 14.3 % (ref 11.5–15.5)
WBC: 4.5 10*3/uL (ref 4.0–10.5)

## 2015-10-08 MED ORDER — FUROSEMIDE 80 MG PO TABS
80.0000 mg | ORAL_TABLET | Freq: Every day | ORAL | Status: DC
  Administered 2015-10-09 – 2015-10-10 (×2): 80 mg via ORAL

## 2015-10-08 MED ORDER — FUROSEMIDE 40 MG PO TABS
40.0000 mg | ORAL_TABLET | Freq: Every evening | ORAL | Status: DC
  Administered 2015-10-08 – 2015-10-09 (×2): 40 mg via ORAL
  Filled 2015-10-08: qty 1

## 2015-10-08 MED ORDER — LISINOPRIL 2.5 MG PO TABS
2.5000 mg | ORAL_TABLET | Freq: Every day | ORAL | Status: DC
  Administered 2015-10-08 – 2015-10-10 (×3): 2.5 mg via ORAL
  Filled 2015-10-08 (×3): qty 1

## 2015-10-08 NOTE — Progress Notes (Signed)
Patient ID: [name], female   DOB: 08/06/1951, 64 y.o.   MRN: [id number]    Patient Name: Penny James Date of Encounter: 10/08/2015    SUBJECTIVE: Patient is doing well this morning, walked unit with cardiac rehab.  No chest pain or exertional dyspnea.  She is now on po Lasix.   Scheduled Meds: . antiseptic oral rinse  7 mL Mouth Rinse q12n4p  . aspirin EC  81 mg Oral Daily  . carvedilol  18.75 mg Oral BID WC  . chlorhexidine  15 mL Mouth Rinse BID  . clopidogrel  75 mg Oral Daily  . enoxaparin (LOVENOX) injection  40 mg Subcutaneous Daily  . ezetimibe  10 mg Oral q1800  . feeding supplement (ENSURE ENLIVE)  237 mL Oral BID BM  . ferrous sulfate  325 mg Oral Q breakfast  . furosemide  40 mg Oral QPM  . [START ON 10/09/2015] furosemide  80 mg Oral Daily  . hydrALAZINE  25 mg Oral 3 times per day  . insulin aspart  0-15 Units Subcutaneous TID WC  . isosorbide mononitrate  60 mg Oral Daily  . lisinopril  2.5 mg Oral Daily  . olopatadine  1 drop Both Eyes BID  . pantoprazole  20 mg Oral Daily  . rosuvastatin  40 mg Oral q1800   Continuous Infusions:  PRN Meds:.acetaminophen, albuterol, nitroGLYCERIN, ondansetron (ZOFRAN) IV  TELEMETRY:  Normal sinus rhythm Filed Vitals:   10/07/15 2001 10/07/15 2329 10/08/15 0315 10/08/15 0732  BP: 107/71 116/59 111/54 130/74  Pulse:      Temp: 98.3 F (36.8 C) 99.1 F (37.3 C) 98.8 F (37.1 C) 97.9 F (36.6 C)  TempSrc: Oral Oral Oral Oral  Resp: 18   18  Height:      Weight:      SpO2: 98% 97% 98% 98%    Intake/Output Summary (Last 24 hours) at 10/08/15 0926 Last data filed at 10/08/15 0800  Gross per 24 hour  Intake 1249.2 ml  Output   1600 ml  Net -350.8 ml   LABS: Basic Metabolic Panel:  Recent Labs  10/07/15 0228 10/08/15 0446  NA 133* 134*  K 3.5 3.9  CL 97* 98*  CO2 27 26  GLUCOSE 102* 120*  BUN 25* 23*  CREATININE 1.22* 1.14*  CALCIUM 8.9 9.1   CBC:  Recent Labs  10/07/15 0228  10/08/15 0446  WBC 4.3 4.5  HGB 9.8* 9.2*  HCT 28.6* 27.0*  MCV 84.4 83.9  PLT 152 151   Radiology/Studies:  Chest x-ray from 10/03/2015:IMPRESSION: Less interstitial edema. Persistent patchy opacity in both lung bases, likely edema. Somewhat greater opacity in the right upper lobe may represent edema, although superimposed pneumonia must be of concern in this area. No new opacity identified. Stable cardiomegaly with mild pulmonary venous hypertension  Physical Exam: Blood pressure 130/74, pulse 86, temperature 97.9 F (36.6 C), temperature source Oral, resp. rate 18, height 5\' 3"  (1.6 m), weight 153 lb 14.1 oz (69.8 kg), SpO2 98 %. Weight change:   Wt Readings from Last 3 Encounters:  10/05/15 153 lb 14.1 oz (69.8 kg)  09/21/15 159 lb (72.122 kg)  09/10/15 159 lb 12.8 oz (72.485 kg)   General: NAD Neck: No JVD, no thyromegaly or thyroid nodule.  Lungs: Clear to auscultation bilaterally with normal respiratory effort. CV: Nondisplaced PMI.  Heart regular S1/S2, no S3/S4, no murmur.  No peripheral edema.   Abdomen: Soft, nontender, no hepatosplenomegaly, no distention.  Skin: Intact without  lesions or rashes.  Neurologic: Alert and oriented x 3.  Psych: Normal affect. Extremities: No clubbing or cyanosis.  HEENT: Normal.   ASSESSMENT: [name] is a 64 year old African-American female who underwent CABG surgery in 2002 by Dr. Cyndia Bent and had a LIMA placed to her LAD, and SVG to her diagonal, SVG to a ramus intermediate, and sequential SVG to a marginal branch and PDA branch of a dominant left circumflex system. She also has a history of peripheral vascular disease and is status post carotid endarterectomy and renal artery stenting. She previously has been documented have graft occlusion of her diagonal vessel and native RCA occlusion. She was admitted with acute on chronic systolic and diastolic heart failure. An echo revealed an ejection fraction of 20-25% with grade 2  diastolic dysfunction, and moderately severe mitral regurgitation with restriction of the posterior leaflet. With her significant drop in LV function and mild flat troponin elevations she was referred for cardiac catheterization. 1. CAD:  Mild troponin elevation at admission, suspect due to volume overload CHF rather than ACS.  Angiography on 12/7 demonstrated all saphenous vein grafts are occluded and that the LIMA to LAD is widely patent but LAD is diffusely disease both antegrade and retrograde from the LIMA anastomosis. Native right coronary is totally occluded. Heavily calcified left main 75% stenosis and 90% ostial LAD. The circumflex is large and diffusely diseased distally. She was seen by cardiac surgery, Dr. Prescott Gum, and is not a repeat surgical candidate because of a severe protamine reaction and poor targets. Drs Claiborne Billings and Tamala Julian reviewed images. PCI would be high risk but feasible. Would need left ventricular assist device (Impella). Left main and ostial LAD and circumflex would likely need rotational atherectomy as the coronaries are heavily calcified.  Dr. Tamala Julian discussed with patient Friday; she understands that she is not an emergency surgical candidate if PCI fails and that Impella would be required. She currently feels better than on admission and our initial approach will be medical therapy. If she has intractable angina with activity, we will arrange for high risk PCI involving left main, LAD, and circumflex bifurcation.  So far, she is doing quite well with no chest pain and improved breathing.  - Continue Coreg and Imdur at current doses.  - Continue ASA 81, Plavix, and Crestor.  2. Acute on chronic systolic CHF: Echo with EF 20-25% (not noted to be this low in past) along with moderate-severe MR.  She is currently euvolemic on exam. Tolerating meds without problems.  At this point, plan is medical management unless she develops significant exertional chest pain.  PCI may improve LV  function but would be high risk.  - I think she can be transferred to telemetry.  - Continue current Coreg and hydralazine/nitrates.  - Will add low dose lisinopril today, creatinine stable.  - Would anticipate adding spironolactone tomorrow if she remains stable.  - Repeat echo in 3 months post-discharge for ?ICD.  - May benefit from followup in CHF clinic as she is a patient who could need advanced therapies down the road.  - Can decrease Lasix to 80 qam/40 qpm.  3. CKD: Creatinine stable.   I think she can go to telemetry today.  Would watch over weekend, possibly home Monday.   Signed, Loralie Champagne 10/08/2015, 9:26 AM

## 2015-10-08 NOTE — Progress Notes (Signed)
CARDIAC REHAB PHASE I   PRE:  Rate/Rhythm: 80 SR  BP:  Supine:   Sitting: 102/51  Standing:    SaO2: 95 RA  MODE:  Ambulation: 700 ft   POST:  Rate/Rhythm: 85 SR  BP:  Supine:   Sitting: 115/53  Standing:    SaO2: 99 RA Patient tolerated ambulation well independently, no angina or difficulty.  Instructed to walk 2 more times today, 3 times tomorrow and to report any angina symptoms to the nursing staff.  Reviewed NTG usage for home use.   [id number]   Liliane Channel RN, BSN 10/08/2015 9:57 AM

## 2015-10-09 LAB — BASIC METABOLIC PANEL
ANION GAP: 8 (ref 5–15)
BUN: 24 mg/dL — ABNORMAL HIGH (ref 6–20)
CALCIUM: 9 mg/dL (ref 8.9–10.3)
CHLORIDE: 96 mmol/L — AB (ref 101–111)
CO2: 29 mmol/L (ref 22–32)
CREATININE: 1.21 mg/dL — AB (ref 0.44–1.00)
GFR calc Af Amer: 53 mL/min — ABNORMAL LOW (ref >60)
GFR calc non Af Amer: 46 mL/min — ABNORMAL LOW (ref >60)
GLUCOSE: 124 mg/dL — AB (ref 65–99)
Potassium: 3.5 mmol/L (ref 3.5–5.1)
Sodium: 133 mmol/L — ABNORMAL LOW (ref 135–145)

## 2015-10-09 LAB — GLUCOSE, CAPILLARY
GLUCOSE-CAPILLARY: 109 mg/dL — AB (ref 65–99)
Glucose-Capillary: 133 mg/dL — ABNORMAL HIGH (ref 65–99)
Glucose-Capillary: 159 mg/dL — ABNORMAL HIGH (ref 65–99)

## 2015-10-09 LAB — BRAIN NATRIURETIC PEPTIDE: B Natriuretic Peptide: 629.5 pg/mL — ABNORMAL HIGH (ref 0.0–100.0)

## 2015-10-09 MED ORDER — SPIRONOLACTONE 25 MG PO TABS
12.5000 mg | ORAL_TABLET | Freq: Every day | ORAL | Status: DC
  Administered 2015-10-09 – 2015-10-10 (×2): 12.5 mg via ORAL
  Filled 2015-10-09: qty 1

## 2015-10-09 NOTE — Progress Notes (Signed)
Patient ID: EMANIE VENTRONE, female   DOB: [birth year], 64 y.o.   MRN: [id number]    Patient Name: Penny James Date of Encounter: 10/09/2015    SUBJECTIVE: Patient is doing well this morning, walked unit yesterday with cardiac rehab.  No chest pain or exertional dyspnea.  She is now on po Lasix.   Scheduled Meds: . aspirin EC  81 mg Oral Daily  . carvedilol  18.75 mg Oral BID WC  . clopidogrel  75 mg Oral Daily  . enoxaparin (LOVENOX) injection  40 mg Subcutaneous Daily  . ezetimibe  10 mg Oral q1800  . feeding supplement (ENSURE ENLIVE)  237 mL Oral BID BM  . ferrous sulfate  325 mg Oral Q breakfast  . furosemide  40 mg Oral QPM  . furosemide  80 mg Oral Daily  . hydrALAZINE  25 mg Oral 3 times per day  . insulin aspart  0-15 Units Subcutaneous TID WC  . isosorbide mononitrate  60 mg Oral Daily  . lisinopril  2.5 mg Oral Daily  . olopatadine  1 drop Both Eyes BID  . pantoprazole  20 mg Oral Daily  . rosuvastatin  40 mg Oral q1800  . spironolactone  12.5 mg Oral Daily   Continuous Infusions:  PRN Meds:.acetaminophen, albuterol, nitroGLYCERIN, ondansetron (ZOFRAN) IV  TELEMETRY:  Normal sinus rhythm Filed Vitals:   10/08/15 1900 10/08/15 2300 10/09/15 0300 10/09/15 0700  BP: 117/56 125/58 131/58 116/62  Pulse:      Temp: 97.8 F (36.6 C) 97.9 F (36.6 C) 98.5 F (36.9 C) 98.3 F (36.8 C)  TempSrc: Oral Oral Oral Oral  Resp: 16 18 18 18   Height:      Weight:      SpO2: 99% 98% 99% 100%    Intake/Output Summary (Last 24 hours) at 10/09/15 1008 Last data filed at 10/09/15 0600  Gross per 24 hour  Intake    440 ml  Output   2600 ml  Net  -2160 ml   LABS: Basic Metabolic Panel:  Recent Labs  10/08/15 0446 10/09/15 0320  NA 134* 133*  K 3.9 3.5  CL 98* 96*  CO2 26 29  GLUCOSE 120* 124*  BUN 23* 24*  CREATININE 1.14* 1.21*  CALCIUM 9.1 9.0   CBC:  Recent Labs  10/07/15 0228 10/08/15 0446  WBC 4.3 4.5  HGB 9.8* 9.2*  HCT 28.6* 27.0*    MCV 84.4 83.9  PLT 152 151   Radiology/Studies:  Chest x-ray from 10/03/2015:IMPRESSION: Less interstitial edema. Persistent patchy opacity in both lung bases, likely edema. Somewhat greater opacity in the right upper lobe may represent edema, although superimposed pneumonia must be of concern in this area. No new opacity identified. Stable cardiomegaly with mild pulmonary venous hypertension  Physical Exam: Blood pressure 116/62, pulse 86, temperature 98.3 F (36.8 C), temperature source Oral, resp. rate 18, height 5\' 3"  (1.6 m), weight 153 lb 14.1 oz (69.8 kg), SpO2 100 %. Weight change:   Wt Readings from Last 3 Encounters:  10/05/15 153 lb 14.1 oz (69.8 kg)  09/21/15 159 lb (72.122 kg)  09/10/15 159 lb 12.8 oz (72.485 kg)   General: NAD Neck: No JVD, no thyromegaly or thyroid nodule.  Lungs: Clear to auscultation bilaterally with normal respiratory effort. CV: Nondisplaced PMI.  Heart regular S1/S2, no S3/S4, no murmur.  No peripheral edema.   Abdomen: Soft, nontender, no hepatosplenomegaly, no distention.  Skin: Intact without lesions or rashes.  Neurologic: Alert and oriented x  3.  Psych: Normal affect. Extremities: No clubbing or cyanosis.  HEENT: Normal.   ASSESSMENT: [name] is a 64 year old African-American female who underwent CABG surgery in 2002 by Dr. Cyndia Bent and had a LIMA placed to her LAD, and SVG to her diagonal, SVG to a ramus intermediate, and sequential SVG to a marginal branch and PDA branch of a dominant left circumflex system. She also has a history of peripheral vascular disease and is status post carotid endarterectomy and renal artery stenting. She previously has been documented have graft occlusion of her diagonal vessel and native RCA occlusion. She was admitted with acute on chronic systolic and diastolic heart failure. An echo revealed an ejection fraction of 20-25% with grade 2 diastolic dysfunction, and moderately severe mitral  regurgitation with restriction of the posterior leaflet. With her significant drop in LV function and mild flat troponin elevations she was referred for cardiac catheterization. 1. CAD:  Mild troponin elevation at admission, suspect due to volume overload CHF rather than ACS.  Angiography on 12/7 demonstrated all saphenous vein grafts are occluded and that the LIMA to LAD is widely patent but LAD is diffusely disease both antegrade and retrograde from the LIMA anastomosis. Native right coronary is totally occluded. Heavily calcified left main 75% stenosis and 90% ostial LAD. The circumflex is large and diffusely diseased distally. She was seen by cardiac surgery, Dr. Prescott Gum, and is not a repeat surgical candidate because of a severe protamine reaction and poor targets. Drs Claiborne Billings and Tamala Julian reviewed images. PCI would be high risk but feasible. Would need left ventricular assist device (Impella). Left main and ostial LAD and circumflex would likely need rotational atherectomy as the coronaries are heavily calcified.  Dr. Tamala Julian discussed with patient Friday; she understands that she is not an emergency surgical candidate if PCI fails and that Impella would be required. She currently feels better than on admission and our initial approach will be medical therapy. If she has intractable angina with activity, we will arrange for high risk PCI involving left main, LAD, and circumflex bifurcation.  So far, she is doing quite well with no chest pain and improved breathing.  - Continue Coreg and Imdur at current doses.  - Continue ASA 81, Plavix, and Crestor.  2. Acute on chronic systolic CHF: Echo with EF 20-25% (not noted to be this low in past) along with moderate-severe MR.  She is currently euvolemic on exam. Tolerating meds without problems.  At this point, plan is medical management unless she develops significant exertional chest pain.  PCI may improve LV function but would be high risk.  - I think she can be  transferred to telemetry.  - Continue current Coreg and hydralazine/nitrates.  - Low dose lisinopril added yesterday, creatinine slightly higher.  - Add spironolactone 12.5 mg daily today.   - po Lasix decreased yesterday, continue same dose today.  - Repeat echo in 3 months post-discharge for ?ICD.  Would consider Lifevest as she will have relatively high risk for ventricular arrhythmias.  - May benefit from followup in CHF clinic as she is a patient who could need advanced therapies down the road.  3. CKD: Creatinine slightly higher today but overall stable.   I think she can go to telemetry today.  If she ambulates and continues to do well, home on Monday.   Signed, Loralie Champagne 10/09/2015, 10:08 AM

## 2015-10-10 ENCOUNTER — Ambulatory Visit (HOSPITAL_COMMUNITY): Payer: Medicare Other

## 2015-10-10 ENCOUNTER — Other Ambulatory Visit: Payer: Self-pay | Admitting: Cardiology

## 2015-10-10 ENCOUNTER — Telehealth: Payer: Self-pay | Admitting: Physician Assistant

## 2015-10-10 ENCOUNTER — Encounter (HOSPITAL_COMMUNITY): Payer: Medicare Other

## 2015-10-10 ENCOUNTER — Other Ambulatory Visit (HOSPITAL_COMMUNITY): Payer: Medicare Other

## 2015-10-10 DIAGNOSIS — I5032 Chronic diastolic (congestive) heart failure: Secondary | ICD-10-CM

## 2015-10-10 DIAGNOSIS — I255 Ischemic cardiomyopathy: Secondary | ICD-10-CM

## 2015-10-10 LAB — CBC
HCT: 28.2 % — ABNORMAL LOW (ref 36.0–46.0)
Hemoglobin: 9.2 g/dL — ABNORMAL LOW (ref 12.0–15.0)
MCH: 27.7 pg (ref 26.0–34.0)
MCHC: 32.6 g/dL (ref 30.0–36.0)
MCV: 84.9 fL (ref 78.0–100.0)
PLATELETS: 173 10*3/uL (ref 150–400)
RBC: 3.32 MIL/uL — ABNORMAL LOW (ref 3.87–5.11)
RDW: 14.1 % (ref 11.5–15.5)
WBC: 3.7 10*3/uL — ABNORMAL LOW (ref 4.0–10.5)

## 2015-10-10 LAB — BASIC METABOLIC PANEL
Anion gap: 8 (ref 5–15)
BUN: 19 mg/dL (ref 6–20)
CALCIUM: 9.2 mg/dL (ref 8.9–10.3)
CHLORIDE: 96 mmol/L — AB (ref 101–111)
CO2: 28 mmol/L (ref 22–32)
CREATININE: 1.24 mg/dL — AB (ref 0.44–1.00)
GFR calc Af Amer: 51 mL/min — ABNORMAL LOW (ref >60)
GFR calc non Af Amer: 44 mL/min — ABNORMAL LOW (ref >60)
GLUCOSE: 104 mg/dL — AB (ref 65–99)
Potassium: 3.6 mmol/L (ref 3.5–5.1)
Sodium: 132 mmol/L — ABNORMAL LOW (ref 135–145)

## 2015-10-10 LAB — GLUCOSE, CAPILLARY
GLUCOSE-CAPILLARY: 118 mg/dL — AB (ref 65–99)
Glucose-Capillary: 141 mg/dL — ABNORMAL HIGH (ref 65–99)
Glucose-Capillary: 135 mg/dL — ABNORMAL HIGH (ref 65–99)

## 2015-10-10 MED ORDER — NORVASC 5 MG PO TABS: 5.0000 mg | ORAL_TABLET | Freq: Every day | ORAL | Status: DC

## 2015-10-10 MED ORDER — FUROSEMIDE 80 MG PO TABS: 80.0000 mg | ORAL_TABLET | Freq: Every morning | ORAL | Status: DC

## 2015-10-10 MED ORDER — ISOSORBIDE MONONITRATE ER 60 MG PO TB24: 60.0000 mg | ORAL_TABLET | Freq: Every day | ORAL | Status: DC

## 2015-10-10 MED ORDER — LISINOPRIL 2.5 MG PO TABS: 2.5000 mg | ORAL_TABLET | Freq: Every day | ORAL | Status: DC

## 2015-10-10 MED ORDER — HYDRALAZINE HCL 25 MG PO TABS: 25.0000 mg | ORAL_TABLET | Freq: Three times a day (TID) | ORAL | Status: DC

## 2015-10-10 MED ORDER — ACETAMINOPHEN 325 MG PO TABS: 650.0000 mg | ORAL_TABLET | ORAL | Status: DC | PRN

## 2015-10-10 MED ORDER — NITROGLYCERIN 0.4 MG SL SUBL: 0.4000 mg | SUBLINGUAL_TABLET | SUBLINGUAL | Status: DC | PRN

## 2015-10-10 MED ORDER — CARVEDILOL 6.25 MG PO TABS: 18.3750 mg | ORAL_TABLET | Freq: Two times a day (BID) | ORAL | Status: DC

## 2015-10-10 MED ORDER — SPIRONOLACTONE 25 MG PO TABS: 12.5000 mg | ORAL_TABLET | Freq: Every day | ORAL | Status: DC

## 2015-10-10 MED ORDER — FUROSEMIDE 40 MG PO TABS: 40.0000 mg | ORAL_TABLET | Freq: Every evening | ORAL | Status: DC

## 2015-10-10 NOTE — Progress Notes (Signed)
Discharge instructions given. Pt. D/c via w/c to car with family.

## 2015-10-10 NOTE — Progress Notes (Signed)
Patient ID: MERISA DHANRAJ, female   DOB: 22-Jun-1951, 64 y.o.   MRN: [id number]    Patient Name: Penny James Date of Encounter: 10/10/2015    SUBJECTIVE: No pain wants to go home   Scheduled Meds: . aspirin EC  81 mg Oral Daily  . carvedilol  18.75 mg Oral BID WC  . clopidogrel  75 mg Oral Daily  . enoxaparin (LOVENOX) injection  40 mg Subcutaneous Daily  . ezetimibe  10 mg Oral q1800  . feeding supplement (ENSURE ENLIVE)  237 mL Oral BID BM  . ferrous sulfate  325 mg Oral Q breakfast  . furosemide  40 mg Oral QPM  . furosemide  80 mg Oral Daily  . hydrALAZINE  25 mg Oral 3 times per day  . insulin aspart  0-15 Units Subcutaneous TID WC  . isosorbide mononitrate  60 mg Oral Daily  . lisinopril  2.5 mg Oral Daily  . olopatadine  1 drop Both Eyes BID  . pantoprazole  20 mg Oral Daily  . rosuvastatin  40 mg Oral q1800  . spironolactone  12.5 mg Oral Daily   Continuous Infusions:  PRN Meds:.acetaminophen, albuterol, nitroGLYCERIN, ondansetron (ZOFRAN) IV  TELEMETRY:  Normal sinus rhythm Filed Vitals:   10/10/15 0004 10/10/15 0456 10/10/15 0500 10/10/15 0739  BP: 120/54  107/53 108/66  Pulse:      Temp:  98 F (36.7 C)  98.5 F (36.9 C)  TempSrc:  Oral  Oral  Resp:    18  Height:      Weight:      SpO2:  98%  99%    Intake/Output Summary (Last 24 hours) at 10/10/15 0855 Last data filed at 10/10/15 0500  Gross per 24 hour  Intake   1170 ml  Output   3100 ml  Net  -1930 ml   LABS: Basic Metabolic Panel:  Recent Labs  10/09/15 0320 10/10/15 0306  NA 133* 132*  K 3.5 3.6  CL 96* 96*  CO2 29 28  GLUCOSE 124* 104*  BUN 24* 19  CREATININE 1.21* 1.24*  CALCIUM 9.0 9.2   CBC:  Recent Labs  10/08/15 0446 10/10/15 0306  WBC 4.5 3.7*  HGB 9.2* 9.2*  HCT 27.0* 28.2*  MCV 83.9 84.9  PLT 151 173   Radiology/Studies:  Chest x-ray from 10/03/2015:IMPRESSION: Less interstitial edema. Persistent patchy opacity in both lung bases, likely edema.  Somewhat greater opacity in the right upper lobe may represent edema, although superimposed pneumonia must be of concern in this area. No new opacity identified. Stable cardiomegaly with mild pulmonary venous hypertension  Physical Exam: Blood pressure 108/66, pulse 76, temperature 98.5 F (36.9 C), temperature source Oral, resp. rate 18, height 5\' 3"  (1.6 m), weight 69.8 kg (153 lb 14.1 oz), SpO2 99 %. Weight change:   Wt Readings from Last 3 Encounters:  10/05/15 69.8 kg (153 lb 14.1 oz)  09/21/15 72.122 kg (159 lb)  09/10/15 72.485 kg (159 lb 12.8 oz)   General: NAD Neck: No JVD, no thyromegaly or thyroid nodule.  Lungs: Clear to auscultation bilaterally with normal respiratory effort. CV: Nondisplaced PMI.  Heart regular S1/S2, no S3/S4, no murmur.  No peripheral edema.   Abdomen: Soft, nontender, no hepatosplenomegaly, no distention.  Skin: Intact without lesions or rashes.  Neurologic: Alert and oriented x 3.  Psych: Normal affect. Extremities: No clubbing or cyanosis.  HEENT: Normal.   ASSESSMENT: Penny James is a 64 year old African-American female who underwent CABG surgery  in 2002 by Dr. Cyndia Bent and had a LIMA placed to her LAD, and SVG to her diagonal, SVG to a ramus intermediate, and sequential SVG to a marginal branch and PDA branch of a dominant left circumflex system. She also has a history of peripheral vascular disease and is status post carotid endarterectomy and renal artery stenting. She previously has been documented have graft occlusion of her diagonal vessel and native RCA occlusion. She was admitted with acute on chronic systolic and diastolic heart failure. An echo revealed an ejection fraction of 20-25% with grade 2 diastolic dysfunction, and moderately severe mitral regurgitation with restriction of the posterior leaflet. With her significant drop in LV function and mild flat troponin elevations she was referred for cardiac catheterization. 1. CAD:  Mild  troponin elevation at admission, suspect due to volume overload CHF rather than ACS.  Angiography on 12/7 demonstrated all saphenous vein grafts are occluded and that the LIMA to LAD is widely patent but LAD is diffusely disease both antegrade and retrograde from the LIMA anastomosis. Native right coronary is totally occluded. Heavily calcified left main 75% stenosis and 90% ostial LAD. The circumflex is large and diffusely diseased distally. She was seen by cardiac surgery, Dr. Prescott Gum, and is not a repeat surgical candidate because of a severe protamine reaction and poor targets. Drs Claiborne Billings and Tamala Julian reviewed images. PCI would be high risk but feasible. Would need left ventricular assist device (Impella). Left main and ostial LAD and circumflex would likely need rotational atherectomy as the coronaries are heavily calcified.  Dr. Tamala Julian discussed with patient Friday; she understands that she is not an emergency surgical candidate if PCI fails and that Impella would be required. She currently feels better than on admission and our initial approach will be medical therapy. If she has intractable angina with activity, we will arrange for high risk PCI involving left main, LAD, and circumflex bifurcation.  So far, she is doing quite well with no chest pain and improved breathing.  - Continue Coreg and Imdur at current doses.  - Continue ASA 81, Plavix, and Crestor.  2. Acute on chronic systolic CHF: Echo with EF 20-25% (not noted to be this low in past) along with moderate-severe MR.  She is currently euvolemic on exam. Tolerating meds without problems.  At this point, plan is medical management unless she develops significant exertional chest pain.  PCI may improve LV function but would be high risk.  - Continue current Coreg and hydralazine/nitrates.  - Low dose lisinopril added 48 hrs ago Cr stable  - Alsdactone added yesterday    - Continue current lasix dose   D/C home today F/U with [facility] BMET  Friday F/U with Dr Radford Pax next available  F/U CHF clinic She will need referral to cardiac rehab  Jenkins Rouge

## 2015-10-10 NOTE — Discharge Summary (Signed)
Patient ID: Penny James,  MRN: [id number], DOB/AGE: [birth year] 64 y.o.  Admit date: 10/01/2015 Discharge date: 10/10/2015  Primary Care Provider: Lucretia Kern., DO Primary Cardiologist: Dr Radford Pax  Discharge Diagnoses Principal Problem:   Acute on chronic systolic (congestive) heart failure [facility]) Active Problems:   NSTEMI (non-ST elevated myocardial infarction) ([place])   Acute kidney injury superimposed on CKD ([place])   Coronary artery disease involving coronary bypass graft   Cardiomyopathy, ischemic-new 12/16   DM (diabetes mellitus) type II controlled peripheral vascular disorder ([facility])   Acute on chronic renal insufficiency ([facility])   Mixed hyperlipidemia   Benign essential HTN   Obesity   PVD (peripheral vascular disease) ([facility])   GERD (gastroesophageal reflux disease)    Procedures: Coronary angiogram 10/05/15   Hospital Course:  Pleasant 65 year old AA hypertensive diabetic female with second admission in the past 3 weeks for congestive heart failure. She was admitted initially to the hospiitalist service at [facility] 10/01/15.  On this admission her cardiac enzymes were positive consistent with non-ST elevation MI.  She improved symptomatically with diuretics and a weight loss of over 10 pounds. A Transthoracic echocardiogram demonstrated new severe LV dysfunction, ejection fraction 20% with ischemic MR moderate-severe. In 2010 a Myoview scan demonstrated EF of 50%. She was transferred to [facility] for Left heart cath. This showed occluded vein grafts to the diagonal, ramus, and distal circumflex and a patent left IMA to the LAD with distal LAD disease at the apex. The native vessels in the circumflex and right coronary distribution are poor targets for grafting. She also had moderate-severe mitral regurgitation directed posteriorly from a tethered posterior leaflet. She was not felt to be a surgical candidate but high risk Lt main PCI with an assist device (Impella) was felt to be a  possible option. The risks and benefits of the procedure were discussed with the pt and at this time she would like to try medical Rx as she is currently asymptomatic. She was transferred to telelmetry and ambulated without problem. Dr Johnsie Cancel feels she can be discharged with plans for a TOC OV in 7 days. BMP has been ordered for later this week. Consider referral to cardiac rehab if she remains symptom free.   Discharge Vitals:  Blood pressure 122/55, pulse 76, temperature 97.9 F (36.6 C), temperature source Oral, resp. rate 18, height 5\' 3"  (1.6 m), weight 153 lb 14.1 oz (69.8 kg), SpO2 99 %.    Labs: Results for orders placed or performed during the hospital encounter of 10/01/15 (from the past 24 hour(s))  Glucose, capillary     Status: Abnormal   Collection Time: 10/09/15  5:04 PM  Result Value Ref Range   Glucose-Capillary 109 (H) 65 - 99 mg/dL   Comment 1 Capillary Specimen   Glucose, capillary     Status: Abnormal   Collection Time: 10/09/15  9:02 PM  Result Value Ref Range   Glucose-Capillary 141 (H) 65 - 99 mg/dL   Comment 1 Capillary Specimen   Basic metabolic panel     Status: Abnormal   Collection Time: 10/10/15  3:06 AM  Result Value Ref Range   Sodium 132 (L) 135 - 145 mmol/L   Potassium 3.6 3.5 - 5.1 mmol/L   Chloride 96 (L) 101 - 111 mmol/L   CO2 28 22 - 32 mmol/L   Glucose, Bld 104 (H) 65 - 99 mg/dL   BUN 19 6 - 20 mg/dL   Creatinine, Ser 1.24 (H) 0.44 - 1.00  mg/dL   Calcium 9.2 8.9 - 10.3 mg/dL   GFR calc non Af Amer 44 (L) >60 mL/min   GFR calc Af Amer 51 (L) >60 mL/min   Anion gap 8 5 - 15  CBC     Status: Abnormal   Collection Time: 10/10/15  3:06 AM  Result Value Ref Range   WBC 3.7 (L) 4.0 - 10.5 K/uL   RBC 3.32 (L) 3.87 - 5.11 MIL/uL   Hemoglobin 9.2 (L) 12.0 - 15.0 g/dL   HCT 28.2 (L) 36.0 - 46.0 %   MCV 84.9 78.0 - 100.0 fL   MCH 27.7 26.0 - 34.0 pg   MCHC 32.6 30.0 - 36.0 g/dL   RDW 14.1 11.5 - 15.5 %   Platelets 173 150 - 400 K/uL  Glucose,  capillary     Status: Abnormal   Collection Time: 10/10/15  7:38 AM  Result Value Ref Range   Glucose-Capillary 118 (H) 65 - 99 mg/dL   Comment 1 Capillary Specimen   Glucose, capillary     Status: Abnormal   Collection Time: 10/10/15 12:05 PM  Result Value Ref Range   Glucose-Capillary 135 (H) 65 - 99 mg/dL   Comment 1 Capillary Specimen     Disposition:      Follow-up Information    Follow up with Charlie Pitter, PA-C On 10/20/2015.   Specialties:  Cardiology, Radiology   Why:  10:00 am   Contact information:   [address] [place] [address] [phone]       Discharge Medications:    Medication List    STOP taking these medications        RED YEAST RICE PO      TAKE these medications        acetaminophen 325 MG tablet  Commonly known as:  TYLENOL  Take 2 tablets (650 mg total) by mouth every 4 (four) hours as needed for headache or mild pain.     aspirin EC 81 MG tablet  Take 81 mg by mouth daily.     BAYER MICROLET LANCETS lancets  Use as instructed     carvedilol 6.25 MG tablet  Commonly known as:  COREG  Take 3 tablets (18.75 mg total) by mouth 2 (two) times daily with a meal.     CRESTOR 40 MG tablet  Generic drug:  rosuvastatin  TAKE 1 TABLET BY MOUTH EVERY DAY     ferrous sulfate 325 (65 FE) MG tablet  Take 325 mg by mouth daily with breakfast.     furosemide 40 MG tablet  Commonly known as:  LASIX  Take 1 tablet (40 mg total) by mouth every evening.     furosemide 80 MG tablet  Commonly known as:  LASIX  Take 1 tablet (80 mg total) by mouth every morning.     Glucose Blood Disk  Commonly known as:  BAYER BREEZE 2 TEST  Test blood sugar four times a day     hydrALAZINE 25 MG tablet  Commonly known as:  APRESOLINE  Take 1 tablet (25 mg total) by mouth every 8 (eight) hours.     isosorbide mononitrate 60 MG 24 hr tablet  Commonly known as:  IMDUR  Take 1 tablet (60 mg total) by mouth daily.     lisinopril  2.5 MG tablet  Commonly known as:  PRINIVIL,ZESTRIL  Take 1 tablet (2.5 mg total) by mouth daily.     MULTIVITAL tablet  Take 1 tablet  by mouth daily.     nitroGLYCERIN 0.4 MG SL tablet  Commonly known as:  NITROSTAT  Place 1 tablet (0.4 mg total) under the tongue every 5 (five) minutes as needed for chest pain.     NORVASC 5 MG tablet  Generic drug:  amLODipine  Take 1 tablet (5 mg total) by mouth daily.     olopatadine 0.1 % ophthalmic solution  Commonly known as:  PATANOL  Place 1 drop into both eyes 2 (two) times daily.     PLAVIX 75 MG tablet  Generic drug:  clopidogrel  TAKE 1 TABLET BY MOUTH EVERY DAY     PREVACID 15 MG capsule  Generic drug:  lansoprazole  TAKE ONE CAPSULE EVERY DAY     spironolactone 25 MG tablet  Commonly known as:  ALDACTONE  Take 0.5 tablets (12.5 mg total) by mouth daily.     ZETIA 10 MG tablet  Generic drug:  ezetimibe  TAKE 1 TABLET BY MOUTH EVERY DAY         Duration of Discharge Encounter: Greater than 30 minutes including physician time.  Angelena Form PA-C 10/10/2015 4:41 PM

## 2015-10-10 NOTE — Progress Notes (Signed)
CARDIAC REHAB PHASE I   PRE:  Rate/Rhythm: 74 SR  BP:  Supine:   Sitting: 120/48  Standing:    SaO2:   MODE:  Ambulation: 700 ft   POST:  Rate/Rhythm: 102 ST  BP:  Supine:   Sitting: 128/54  Standing:    SaO2:  0915-0950 Pt walked 700 ft  With steady gait. Stopped once to rest. Tolerated well. No CP. Education re ex ed and CRP 2 completed. Referring pt to [place] program. Pt has diabetic and low sodium diets. Discussed keeping sodium to 2000 mg a day and reviewed need for daily weights. Pt stated she keeps her diabetes well controlled with her diet and understands importance of watching sodium with low EF.   Graylon Good, RN BSN  10/10/2015 9:47 AM

## 2015-10-10 NOTE — Telephone Encounter (Signed)
New message      TCM appt on 10-20-15 with Dayna Dunn.  Pt will be discharged sometimes today.

## 2015-10-10 NOTE — Discharge Instructions (Signed)
Angina Pectoris Angina pectoris is a very bad feeling in the chest, neck, or arm. Your doctor may call it angina. There are four types of angina. Angina is caused by a lack of blood in the middle and thickest layer of the heart wall (myocardium). Angina may feel like a crushing or squeezing pain in the chest. It may feel like tightness or heavy pressure in the chest. Some people say it feels like gas, heartburn, or indigestion. Some people have symptoms other than pain. These include:  Shortness of breath.  Cold sweats.  Feeling sick to your stomach (nausea).  Feeling light-headed. Many women have chest discomfort and some of the other symptoms. However, women often have different symptoms, such as:  Feeling tired (fatigue).  Feeling nervous for no reason.  Feeling weak for no reason.  Dizziness or fainting. Women may have angina without any symptoms. HOME CARE  Take medicines only as told by your doctor.  Take care of other health issues as told by your doctor. These include:  High blood pressure (hypertension).  Diabetes.  Follow a heart-healthy diet. Your doctor can help you to choose healthy food options and make changes.  Talk to your doctor to learn more about healthy cooking methods and use them. These include:  Roasting.  Grilling.  Broiling.  Baking.  Poaching.  Steaming.  Stir-frying.  Follow an exercise program approved by your doctor.  Keep a healthy weight. Lose weight as told by your doctor.  Rest when you are tired.  Learn to manage stress.  Do not use any tobacco, such as cigarettes, chewing tobacco, or electronic cigarettes. If you need help quitting, ask your doctor.  If you drink alcohol, and your doctor says it is okay, limit yourself to no more than 1 drink per day. One drink equals 12 ounces of beer, 5 ounces of wine, or 1 ounces of hard liquor.  Stop illegal drug use.  Keep all follow-up visits as told by your doctor. This is  important. Do not take these medicines unless your doctor says that you can:  Nonsteroidal anti-inflammatory drugs (NSAIDs). These include:  Ibuprofen.  Naproxen.  Celecoxib.  Vitamin supplements that have vitamin A, vitamin E, or both.  Hormone therapy that contains estrogen with or without progestin. GET HELP RIGHT AWAY IF:  You have pain in your chest, neck, arm, jaw, stomach, or back that:  Lasts more than a few minutes.  Comes back.  Does not get better after you take medicine under your tongue (sublingual nitroglycerin).  You have any of these symptoms for no reason:  Gas, heartburn, or indigestion.  Sweating a lot.  Shortness of breath or trouble breathing.  Feeling sick to your stomach or throwing up.  Feeling more tired than usual.  Feeling nervous or worrying more than usual.  Feeling weak.  Diarrhea.  You are suddenly dizzy or light-headed.  You faint or pass out. These symptoms may be an emergency. Do not wait to see if the symptoms will go away. Get medical help right away. Call your local emergency services (911 in the [place] Do not drive yourself to the hospital.   This information is not intended to replace advice given to you by your health care provider. Make sure you discuss any questions you have with your health care provider.   Document Released: 04/02/2008 Document Revised: 03/01/2015 Document Reviewed: 02/16/2014 Elsevier Interactive Patient Education 2016 [place]. Heart Failure Heart failure means your heart has trouble pumping blood. This makes  it hard for your body to work well. Heart failure is usually a long-term (chronic) condition. You must take good care of yourself and follow your doctor's treatment plan. HOME CARE  Take your heart medicine as told by your doctor.  Do not stop taking medicine unless your doctor tells you to.  Do not skip any dose of medicine.  Refill your medicines before they run out.  Take other  medicines only as told by your doctor or pharmacist.  Stay active if told by your doctor. The elderly and people with severe heart failure should talk with a doctor about physical activity.  Eat heart-healthy foods. Choose foods that are without trans fat and are low in saturated fat, cholesterol, and salt (sodium). This includes fresh or frozen fruits and vegetables, fish, lean meats, fat-free or low-fat dairy foods, whole grains, and high-fiber foods. Lentils and dried peas and beans (legumes) are also good choices.  Limit salt if told by your doctor.  Cook in a healthy way. Roast, grill, broil, bake, poach, steam, or stir-fry foods.  Limit fluids as told by your doctor.  Weigh yourself every morning. Do this after you pee (urinate) and before you eat breakfast. Write down your weight to give to your doctor.  Take your blood pressure and write it down if your doctor tells you to.  Ask your doctor how to check your pulse. Check your pulse as told.  Lose weight if told by your doctor.  Stop smoking or chewing tobacco. Do not use gum or patches that help you quit without your doctor's approval.  Schedule and go to doctor visits as told.  Nonpregnant women should have no more than 1 drink a day. Men should have no more than 2 drinks a day. Talk to your doctor about drinking alcohol.  Stop illegal drug use.  Stay current with shots (immunizations).  Manage your health conditions as told by your doctor.  Learn to manage your stress.  Rest when you are tired.  If it is really hot outside:  Avoid intense activities.  Use air conditioning or fans, or get in a cooler place.  Avoid caffeine and alcohol.  Wear loose-fitting, lightweight, and light-colored clothing.  If it is really cold outside:  Avoid intense activities.  Layer your clothing.  Wear mittens or gloves, a hat, and a scarf when going outside.  Avoid alcohol.  Learn about heart failure and get support as  needed.  Get help to maintain or improve your quality of life and your ability to care for yourself as needed. GET HELP IF:   You gain weight quickly.  You are more short of breath than usual.  You cannot do your normal activities.  You tire easily.  You cough more than normal, especially with activity.  You have any or more puffiness (swelling) in areas such as your hands, feet, ankles, or belly (abdomen).  You cannot sleep because it is hard to breathe.  You feel like your heart is beating fast (palpitations).  You get dizzy or light-headed when you stand up. GET HELP RIGHT AWAY IF:   You have trouble breathing.  There is a change in mental status, such as becoming less alert or not being able to focus.  You have chest pain or discomfort.  You faint. MAKE SURE YOU:   Understand these instructions.  Will watch your condition.  Will get help right away if you are not doing well or get worse.   This information is  not intended to replace advice given to you by your health care provider. Make sure you discuss any questions you have with your health care provider.   Document Released: 07/24/2008 Document Revised: 11/05/2014 Document Reviewed: 12/01/2012 Elsevier Interactive Patient Education [employer].

## 2015-10-10 NOTE — Care Management Important Message (Signed)
Important Message  Patient Details  Name: Penny James MRN: [id number] Date of Birth: 06-01-1951   Medicare Important Message Given:  Yes    [name] [name] 10/10/2015, 1:57 PM

## 2015-10-11 ENCOUNTER — Telehealth: Payer: Self-pay | Admitting: Cardiology

## 2015-10-11 ENCOUNTER — Encounter (HOSPITAL_COMMUNITY): Payer: Medicare Other

## 2015-10-11 MED ORDER — ZESTRIL 2.5 MG PO TABS: 2.5000 mg | ORAL_TABLET | Freq: Every day | ORAL | Status: DC

## 2015-10-11 NOTE — Telephone Encounter (Signed)
Why can she not take generic imdur

## 2015-10-11 NOTE — Telephone Encounter (Signed)
Left message to call back  

## 2015-10-11 NOTE — Telephone Encounter (Signed)
°  Follow Up    Pt is requesting to speak to a nurse regarding prior mess and getting scripts for non-generic medications only. Please call.

## 2015-10-11 NOTE — Telephone Encounter (Signed)
Is there a brand name drug for isosorbide dinitrate

## 2015-10-11 NOTE — Telephone Encounter (Signed)
Patient contacted regarding discharge from [facility].   Patient understands to follow up with provider ? On 10/20/2015 at 10AM .  Patient understands discharge instructions. States they went over them with her three times and she does not have any questions Patient understands medications and regiment.  Patient understands to bring all medications to this visit  Patient said that she feels good this morning

## 2015-10-11 NOTE — Telephone Encounter (Signed)
New message      Pt c/o medication issue:  1. Name of Medication: imdur 2. How are you currently taking this medication (dosage and times per day)? 60mg  daily 3. Are you having a reaction (difficulty breathing--STAT)? no 4. What is your medication issue?  Pharmacy can no longer get this drug.  Pt cannot take generic drugs.  Is there another brand name drug you can call in to [facility] [address] for pt?

## 2015-10-11 NOTE — Telephone Encounter (Signed)
Patient requesting name brand Zestril called in - Rx sent was for generic. Patient also st her pharmacist told her Imdur no longer comes in brand name. She requests Dr. Radford Pax change her Imdur to a medication that can be ordered as brand name only.

## 2015-10-11 NOTE — Telephone Encounter (Signed)
Patient states she is intolerant to generics. She blames generic medications for all of her hospital stays.  She refuses to take them.

## 2015-10-12 ENCOUNTER — Encounter: Payer: Self-pay | Admitting: Family

## 2015-10-12 NOTE — Telephone Encounter (Signed)
Informed patient that Dr. Radford Pax and the pharmacist are working on their recommendations for the Imdur. She understands she will be called when they come to a conclusion.  Patient also st her insurance is requiring PA for Zestril and hydralazine. To the office PA nurse for follow-up.

## 2015-10-12 NOTE — Telephone Encounter (Signed)
Follow up    Patient called on yesterday - would like a call back today from the nurse.

## 2015-10-12 NOTE — Telephone Encounter (Signed)
Given she has CHF and African American and also taking hydralazine, can we change her to Bystolic?    Very unlikely that insurance will pay for brand name medications without clear link to a reaction to generic ACE-I.

## 2015-10-13 NOTE — Telephone Encounter (Signed)
Please check with insurance company to find out why a prior auth is needed for an ACE I and hydralazine

## 2015-10-17 ENCOUNTER — Encounter: Payer: Self-pay | Admitting: Cardiology

## 2015-10-17 NOTE — Telephone Encounter (Signed)
Is patient ok staying on Coreg?

## 2015-10-17 NOTE — Telephone Encounter (Signed)
Patient calling to check status of tier exemptions / PAs needed for Zestril and Hydralazine.  Informed patient the process was started last week but takes a few business days to go through.   Patient also inquiring about medication to be taken instead of isosorbide since she st she cannot take generic medications.

## 2015-10-17 NOTE — Telephone Encounter (Signed)
She is currently on Coreg - is there an issue with that as well?

## 2015-10-17 NOTE — Telephone Encounter (Signed)
Patient st she is fine with Coreg.

## 2015-10-17 NOTE — Telephone Encounter (Signed)
New message ° ° ° ° ° °Talk to the nurse.  She would not tell me what she wanted °

## 2015-10-17 NOTE — Telephone Encounter (Signed)
This encounter was created in error - please disregard.

## 2015-10-18 ENCOUNTER — Ambulatory Visit: Payer: Medicare Other | Admitting: Family

## 2015-10-18 ENCOUNTER — Encounter (HOSPITAL_COMMUNITY): Payer: Medicare Other

## 2015-10-18 ENCOUNTER — Telehealth: Payer: Self-pay | Admitting: Cardiology

## 2015-10-18 NOTE — Telephone Encounter (Signed)
Spoke with patient about Imdur not being available. She states she understands that. I advised her that we have received request for PA on Zestril and Vasotec. She has an appt to see Dr. Radford Pax Thursday, so I may wait to see if any changes are made.

## 2015-10-18 NOTE — Telephone Encounter (Signed)
Patient aware of this. She has an appt Thursday with Dr. Radford Pax, and will discuss then.

## 2015-10-18 NOTE — Telephone Encounter (Signed)
Patient has no complaints about Coreg.  She simply refuses to take generic isosorbide.  She awaits instructions if Dr. Radford Pax would like to order something else instead.

## 2015-10-18 NOTE — Telephone Encounter (Signed)
This is a duplicate message.

## 2015-10-18 NOTE — Telephone Encounter (Signed)
New message      Calling to talk to somone to get a presc for imdur brand name.  She cannot take a generic.  Please call and give update on what Dr Radford Pax want to do.

## 2015-10-18 NOTE — Telephone Encounter (Signed)
I am not in the office on Thursday - is there a problem with hydralazine and ACE I as well or just Imdur

## 2015-10-19 ENCOUNTER — Encounter: Payer: Self-pay | Admitting: Physician Assistant

## 2015-10-19 NOTE — Progress Notes (Signed)
Cardiology Office Note Date:  10/20/2015  Patient ID:  Penny James, Penny James [birth year], MRN [id number] PCP:  Lucretia Kern., DO  Cardiologist:  Dr. Radford Pax   Chief Complaint: f/u CHF  History of Present Illness: Penny James is a 64 y.o. 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 She was admitted earlier this month with SOB, elevated troponin, and worsening CHF. 2D Echo revealed new LV dysfunction wtih EF 20-25%, diffuse HK with inferolateral akinesis, grade 2 DD, mod-severe MR, mod dilated LA, mild pulm HTN. Per notes [place] on 12/7 showed "all saphenous vein grafts are occluded and that the LIMA to LAD is widely patent but LAD is diffusely disease both antegrade and retrograde from the LIMA anastomosis. Native right coronary is totally occluded. Heavily calcified left main 75% stenosis and 90% ostial LAD. The circumflex is large and diffusely diseased distally." She also had moderate-severe mitral regurgitation directed posteriorly from a tethered posterior leaflet. She was not felt to be a surgical candidate (severe protamine reaction, poor targets) but high risk PCI with an assist device (Impella) was felt to be a possible option. The risks and benefits of the procedure were discussed with the pt who elected trial of medical therapy given improvement in symptoms. CHF meds were titrated. D/c Cr 1.24, Hgb 9.2.   There have been several phone notes since that time going back and forth with the patient being concerned about being on generic meds - she blames these for her prior hospitalizations and requests brand-name only. From CHF standpoint she was supposed to be discharged on carvedilol 18.75mg  BID, Lasix 80mg  QAM/40mg  QPM, hydralazine  25mg  TID, Imdur 60mg  daily, lisinopril 2.5mg  daily, amlodipine 5mg  daily, and spironolactone 12.5mg  daily. However, she a) did not realize she was supposed to take 2 different doses of Lasix so has only been doing this 80mg  QAM, b) refuses to take hydralazine or isosorbide due to no brand name available, c) is awaiting prior auth from our office on brand-name lisinopril, and d) did not think that spironolactone had been sent into her pharmacy (it appears it was). From a clinical standpoint she is doing very well. No CP, SOB, orthopnea, LEE or recurrent weight gain.   Past Medical History  Diagnosis Date  . Hyperlipidemia   . Arthritis   . Anemia   . Chronic combined systolic and diastolic CHF (congestive heart failure) ([place])     a. EF 55% by cath 2010 but in 09/2015 found to be 20%, with mod-severe MR, grade 2 DD.  . Bipolar affective disorder ([place])   . GERD (gastroesophageal reflux disease)   . Obesity   . Peripheral neuropathy ([place])   . 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 CAD (coronary artery disease)     a. CABG in 2002. b. Cath 09/2015: poor targets, not a good surgical candidate, high risk PCI reserved for recurrent symptoms.  . Carotid artery stenosis     60-79% bilateral ICA stenosis s/p Right CEA 2013  followed by Dr. Donnetta Hutching  . Mitral regurgitation     a. Mod-severe by echo 09/2015.  . Ischemic cardiomyopathy   .  CKD (chronic kidney disease), stage III     Past Surgical History  Procedure Laterality Date  . Abdominal hysterectomy  1984  . Sp pta renal / visc  artery  2003    with stent of left renal artery  . Tonsillectomy      AS A CHILD  . Endarterectomy  02/18/2012    rocedure: ENDARTERECTOMY CAROTID;  Surgeon: Rosetta Posner, MD;  Location: [facility];  Service: Vascular;  Laterality: Right;  Right Carotid endarterectomy with Dacron  patch angioplasty with resection of internal carotid artery  . Coronary artery bypass graft      3 vessel coronary disease S?P CABG with widely patent grafts by cath in 2005 and distal disease past the graft insertion sites.  . Cardiac catheterization  03/25/09    Occluded SVG to intermediate and SVG to OM/posterior descending artery with PTCA left circ 6/10 s/p PTCA stent in SVG to OM/PDA, 07/2002 s/p PCI of left circ 03/2009  . Cardiac catheterization N/A 10/05/2015    Procedure: Left Heart Cath and Cors/Grafts Angiography;  Surgeon: Troy Sine, MD;  Location: [facility];  Service: Cardiovascular;  Laterality: N/A;    Current Outpatient Prescriptions  Medication Sig Dispense Refill  . acetaminophen (TYLENOL) 325 MG tablet Take 2 tablets (650 mg total) by mouth every 4 (four) hours as needed for headache or mild pain.    [name] aspirin EC 81 MG tablet Take 81 mg by mouth daily.    [name] BAYER MICROLET LANCETS lancets Use as instructed 100 each 3  . carvedilol (COREG) 6.25 MG tablet Take 3 tablets (18.75 mg total) by mouth 2 (two) times daily with a meal. 100 tablet 11  . CRESTOR 40 MG tablet TAKE 1 TABLET BY MOUTH EVERY DAY 30 tablet 11  . ferrous sulfate 325 (65 FE) MG tablet Take 325 mg by mouth daily with breakfast.    . furosemide (LASIX) 80 MG tablet Take 1 tablet (80 mg total) by mouth every morning. 30 tablet 11  . Glucose Blood (BAYER BREEZE 2 TEST) DISK Test blood sugar four times a day 200 each 5  . Multiple Vitamins-Minerals (MULTIVITAL) tablet Take 1 tablet by mouth daily.     . nitroGLYCERIN (NITROSTAT) 0.4 MG SL tablet Place 1 tablet (0.4 mg total) under the tongue every 5 (five) minutes as needed for chest pain. 25 tablet 2  . NORVASC 5 MG tablet Take 1 tablet (5 mg total) by mouth daily. 30 tablet 11  . olopatadine (PATANOL) 0.1 % ophthalmic solution Place 1 drop into both eyes 2 (two) times daily. 5 mL 1  . PLAVIX 75 MG tablet TAKE 1 TABLET BY MOUTH EVERY DAY 90 tablet 0  .  PREVACID 15 MG capsule TAKE ONE CAPSULE EVERY DAY 90 capsule 0  . ZETIA 10 MG tablet TAKE 1 TABLET BY MOUTH EVERY DAY 90 tablet 0   No current facility-administered medications for this visit.    Allergies:   Penicillins; Protamine; and Amlodipine   Social History:  The patient  reports that she has never smoked. She has never used smokeless tobacco. She reports that she does not drink alcohol or use illicit drugs.   Family History:  The patient's family history includes Cerebral aneurysm in her brother; Clotting disorder in her sister; Coronary artery disease in her paternal grandmother; Glaucoma in her father; Heart disease in her brother; Hypertension in her father; Stroke in her paternal grandmother.  ROS:  Please see the history of  present illness.  All other systems are reviewed and otherwise negative.   PHYSICAL EXAM:   VS:  BP 124/62 mmHg  Pulse 66  Ht 5\' 3"  (1.6 m)  Wt 152 lb 9.6 oz (69.219 kg)  BMI 27.04 kg/m2 BMI: Body mass index is 27.04 kg/(m^2). Well nourished, well developed AAF, in no acute distress HEENT: normocephalic, atraumatic Neck: no JVD, carotid bruits or masses Cardiac:  normal S1, S2; RRR; no murmurs, rubs, or gallops Lungs:  clear to auscultation bilaterally, no wheezing, rhonchi or rales Abd: soft, nontender, no hepatomegaly, + BS. Ecchymosis noted on site of prior injection, beginning to yellow indicating early stages of resolution MS: no deformity or atrophy Ext: no edema, right groin cath site without hematoma, ecchymosis or bruit.  Skin: warm and dry, no rash Neuro:  moves all extremities spontaneously, no focal abnormalities noted, follows commands Psych: euthymic mood, full affect   EKG:  Done today shows NSR 66bpm with 1st degree AVB, NSIVCD, nonspecific T wave changes (TWI inferiorly, V4-V6 and TW flattening in V3).  Recent Labs: 04/01/2015: Pro B Natriuretic peptide (BNP) 460.0* 09/09/2015: TSH 2.595 10/01/2015: ALT 37 10/03/2015: Magnesium  2.1 10/09/2015: B Natriuretic Peptide 629.5* 10/10/2015: BUN 19; Creatinine, Ser 1.24*; Hemoglobin 9.2*; Platelets 173; Potassium 3.6; Sodium 132*  09/21/2015: Cholesterol 96*; HDL 32*; LDL Cholesterol 54; Total CHOL/HDL Ratio 3.0; Triglycerides 48; VLDL 10   Estimated Creatinine Clearance: 41.6 mL/min (by C-G formula based on Cr of 1.24).   Wt Readings from Last 3 Encounters:  10/20/15 152 lb 9.6 oz (69.219 kg)  10/05/15 153 lb 14.1 oz (69.8 kg)  09/21/15 159 lb (72.122 kg)     Other studies reviewed: Additional studies/records reviewed today include: summarized above  ASSESSMENT AND PLAN:  1. Chronic combined CHF - clinically she remains euvolemic. The majority of the visit was spent sorting out the medication discrepancies from discharge list compared to what she is actually taking. I confirmed our office is working on prior auth for her lisinopril/Zestril. Will plan to begin this when it is approved. I also recommended she start spironolactone - we sent in RX for aldactone 12.5mg  daily as previously recommended. Check BMET today (to make sure stable from recent hospitalization) and repeat in 1 week given spiro initiation. She is currently normotensive on just Lasix and carvedilol so will plan to f/u BP at next visit. If it is stable, would consider adding back isosorbide/hydralazine in the form of Bidil to appease her request for brand-name meds only. Will defer timing of f/u echo to primary cardiologist based on patient med compliance and tolerance. 2. HTN - follow with med changes.  3. CAD - continue medical therapy with aspirin, Plavix, BB, statin. 4. Mitral regurgitation - continue medical therapy for now as above. 5. Normocytic anemia - she was unaware of this finding. No bleeding reported. I asked her to f/u PCP to discuss further monitoring and workup.  Disposition: F/u with Dr. Radford Pax in 4-6 weeks.  Current medicines are reviewed at length with the patient today.  The patient  did not have any concerns regarding medicines.  Raechel Ache PA-C 10/20/2015 10:14 AM     [facility] [address] [place] [address] [phone] (office)  [phone] (fax)

## 2015-10-19 NOTE — Telephone Encounter (Signed)
We may be able to switch her hydralazine/Imdur to Bidil which is brand name only. I will discuss @ f/u tomorrow as planned. Dayna Dunn PA-C

## 2015-10-19 NOTE — Telephone Encounter (Signed)
Patient has post hospital OV with Melina Copa tomorrow. Will discuss medications then.

## 2015-10-20 ENCOUNTER — Ambulatory Visit (INDEPENDENT_AMBULATORY_CARE_PROVIDER_SITE_OTHER): Payer: Medicare Other | Admitting: Physician Assistant

## 2015-10-20 ENCOUNTER — Encounter: Payer: Self-pay | Admitting: Physician Assistant

## 2015-10-20 VITALS — BP 124/62 | HR 66 | Ht 63.0 in | Wt 152.6 lb

## 2015-10-20 DIAGNOSIS — I150 Renovascular hypertension: Secondary | ICD-10-CM

## 2015-10-20 DIAGNOSIS — I340 Nonrheumatic mitral (valve) insufficiency: Secondary | ICD-10-CM

## 2015-10-20 DIAGNOSIS — I5042 Chronic combined systolic (congestive) and diastolic (congestive) heart failure: Secondary | ICD-10-CM | POA: Diagnosis not present

## 2015-10-20 DIAGNOSIS — D649 Anemia, unspecified: Secondary | ICD-10-CM

## 2015-10-20 DIAGNOSIS — I2510 Atherosclerotic heart disease of native coronary artery without angina pectoris: Secondary | ICD-10-CM | POA: Diagnosis not present

## 2015-10-20 LAB — BASIC METABOLIC PANEL
BUN: 18 mg/dL (ref 7–25)
CHLORIDE: 98 mmol/L (ref 98–110)
CO2: 27 mmol/L (ref 20–31)
CREATININE: 1.13 mg/dL — AB (ref 0.50–0.99)
Calcium: 9.1 mg/dL (ref 8.6–10.4)
Glucose, Bld: 106 mg/dL — ABNORMAL HIGH (ref 65–99)
Potassium: 4 mmol/L (ref 3.5–5.3)
Sodium: 133 mmol/L — ABNORMAL LOW (ref 135–146)

## 2015-10-20 MED ORDER — SPIRONOLACTONE 25 MG PO TABS: 12.5000 mg | ORAL_TABLET | Freq: Every day | ORAL | Status: DC

## 2015-10-20 NOTE — Patient Instructions (Signed)
Medication Instructions:  Your physician has recommended you make the following change in your medication:  1) START Aldactone 12.5mg  daily. An Rx has been sent to your pharmacy  We are waiting on the prior authorization from your insurance company for [place]: Bmet today  Your physician recommends that you return for lab work in: 1 week (Bmet)   Testing/Procedures: None ordered  Follow-Up: Your physician recommends that you schedule a follow-up appointment in: 4-6 weeks with Dr.Turner/ or an APP a day that Dr.Turner is in the office   Any Other Special Instructions Will Be Listed Below (If Applicable).     If you need a refill on your cardiac medications before your next appointment, please call your pharmacy.

## 2015-10-21 ENCOUNTER — Telehealth: Payer: Self-pay | Admitting: Cardiology

## 2015-10-21 ENCOUNTER — Telehealth: Payer: Self-pay

## 2015-10-21 NOTE — Telephone Encounter (Signed)
Prior auth for name brand Zestril 2.5mg  sent to [facility].

## 2015-10-21 NOTE — Telephone Encounter (Signed)
Will refer patient to [place] with prior authorizations. Patient knows that [name] is back next week and she is fine with that.

## 2015-10-21 NOTE — Telephone Encounter (Signed)
New Message  Pt stated a certain medication prescribed to her is not available at her pharmacy- pt did not recall the name of medication or its function. Pt wanted to discuss an alternative to the med. Please call back and discuss.

## 2015-10-26 NOTE — Addendum Note (Signed)
Addended by: Eulis Foster on: 10/26/2015 04:06 PM   Modules accepted: Orders

## 2015-10-27 ENCOUNTER — Other Ambulatory Visit (INDEPENDENT_AMBULATORY_CARE_PROVIDER_SITE_OTHER): Payer: Medicare Other | Admitting: *Deleted

## 2015-10-27 DIAGNOSIS — I5042 Chronic combined systolic (congestive) and diastolic (congestive) heart failure: Secondary | ICD-10-CM

## 2015-10-27 LAB — BASIC METABOLIC PANEL
BUN: 18 mg/dL (ref 7–25)
CHLORIDE: 99 mmol/L (ref 98–110)
CO2: 29 mmol/L (ref 20–31)
CREATININE: 1.13 mg/dL — AB (ref 0.50–0.99)
Calcium: 9.2 mg/dL (ref 8.6–10.4)
Glucose, Bld: 112 mg/dL — ABNORMAL HIGH (ref 65–99)
Potassium: 3.8 mmol/L (ref 3.5–5.3)
Sodium: 135 mmol/L (ref 135–146)

## 2015-11-01 ENCOUNTER — Telehealth: Payer: Self-pay | Admitting: Cardiology

## 2015-11-01 NOTE — Telephone Encounter (Signed)
New message      Pt is coughing.  Talk to the nurse

## 2015-11-01 NOTE — Telephone Encounter (Signed)
F/u ° ° °Pt returning your call °

## 2015-11-01 NOTE — Telephone Encounter (Signed)
Left message to call back  

## 2015-11-01 NOTE — Telephone Encounter (Signed)
Brand name Zestril 2.5mg  approved by [facility].  Good through 10/20/2016.

## 2015-11-01 NOTE — Telephone Encounter (Signed)
Patient called c/o productive cough with green phlegm.  Instructed patient to call PCP. Patient agrees with treatment plan.

## 2015-11-09 ENCOUNTER — Telehealth: Payer: Self-pay | Admitting: *Deleted

## 2015-11-09 NOTE — Telephone Encounter (Signed)
We did not eval her for a breast lump and I am not aware of any breast lump. Perhaps she told another provider? She no showed her recent scheduled physical appointment. Last and first time I saw her was for her establish care visit > 3 months ago. Advise urgent eval of any breast concerns.

## 2015-11-09 NOTE — Telephone Encounter (Signed)
I called [facility] and spoke with someone named Tuesday and informed her of the message below.  Tuesday stated the pt could not wait as she rode with someone and rescheduled for 1/19 and asked if the order would be sent.  I advised her per Dr Maudie Mercury the pt was not seen by her for this problem and she needs an appt.  I called the pt and left a message for her to return my call.

## 2015-11-09 NOTE — Telephone Encounter (Signed)
The pt called back and I informed her of the message below and she stated she has had a lump for about 9 months.  I scheduled the pt an appt for 1/13 and asked that she be here at 9:15am and she agreed.

## 2015-11-09 NOTE — Telephone Encounter (Signed)
Ivin Booty called from [facility] [phone] ext [phone]) stating the pt came in today (there now) for a screening mammogram and told the check-in person she has a large lump in the left breast and Dr Maudie Mercury knows about this?  Ivin Booty states the pt told her she mentioned this to Dr Maudie Mercury at her last visit.  Please let her know what to do.

## 2015-11-11 ENCOUNTER — Ambulatory Visit (INDEPENDENT_AMBULATORY_CARE_PROVIDER_SITE_OTHER): Payer: Medicare Other | Admitting: Family Medicine

## 2015-11-11 ENCOUNTER — Encounter: Payer: Self-pay | Admitting: Family Medicine

## 2015-11-11 VITALS — BP 124/70 | HR 79 | Temp 98.3°F | Ht 63.0 in | Wt 155.3 lb

## 2015-11-11 DIAGNOSIS — N63 Unspecified lump in unspecified breast: Secondary | ICD-10-CM

## 2015-11-11 NOTE — Patient Instructions (Addendum)
BEFORE YOU LEAVE: -wait on details of breast evaluation appointment  Go to get the evaluation of the breast lump as instructed  Make sure to keep your appointment for your physical  Please arrive 15 minutes early for all appointment and call 2 days ahead if any appointments need to be rescheduled.

## 2015-11-11 NOTE — Progress Notes (Signed)
HPI:  Acute visit for:  Breast Lump: -started: "2-3 months ago" on review of chart after visit she told my assistant 11/09/15 that this had been present for "9 months", she told the breast center she saw me for this but she has only seen me once and did not mention this -denies: pain, malaise, fevers, chills, drainage, skin changes, nipple discharge -[id number] any hx of breast lump or breast issues, reports she does self breast exams and gets regular mammograms -FH: denies any family hx of breast or ovarian cancer  -she self request a screening mammo at [facility] but since she reported to them that she had a lump needed appt here first  Note: hx of very poor compliance with visits, no showed initial visit and recent physical  ROS: See pertinent positives and negatives per HPI.  Past Medical History  Diagnosis Date  . Hyperlipidemia   . Arthritis   . Anemia   . Chronic combined systolic and diastolic CHF (congestive heart failure) ([place])     a. EF 55% by cath 2010 but in 09/2015 found to be 20%, with mod-severe MR, grade 2 DD.  . Bipolar affective disorder ([place])   . GERD (gastroesophageal reflux disease)   . Obesity   . Peripheral neuropathy ([place])   . 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 CAD (coronary artery disease)     a. CABG in 2002. b. Cath 09/2015: poor targets, not a good surgical candidate, high risk PCI reserved for recurrent symptoms.  . Carotid artery stenosis     60-79% bilateral ICA stenosis s/p Right CEA 2013  followed by Dr. Donnetta Hutching  . Mitral regurgitation     a. Mod-severe by echo 09/2015.  . Ischemic cardiomyopathy   . CKD (chronic kidney disease), stage III     Past Surgical History  Procedure Laterality Date  . Abdominal hysterectomy  1984  . Sp pta renal / visc  artery  2003    with stent of left renal artery  .  Tonsillectomy      AS A CHILD  . Endarterectomy  02/18/2012    rocedure: ENDARTERECTOMY CAROTID;  Surgeon: Rosetta Posner, MD;  Location: [facility] OR;  Service: Vascular;  Laterality: Right;  Right Carotid endarterectomy with Dacron patch angioplasty with resection of internal carotid artery  . Coronary artery bypass graft      3 vessel coronary disease S?P CABG with widely patent grafts by cath in 2005 and distal disease past the graft insertion sites.  . Cardiac catheterization  03/25/09    Occluded SVG to intermediate and SVG to OM/posterior descending artery with PTCA left circ 6/10 s/p PTCA stent in SVG to OM/PDA, 07/2002 s/p PCI of left circ 03/2009  . Cardiac catheterization N/A 10/05/2015    Procedure: Left Heart Cath and Cors/Grafts Angiography;  Surgeon: Troy Sine, MD;  Location: [facility];  Service: Cardiovascular;  Laterality: N/A;    Family History  Problem Relation Age of Onset  . Heart disease Brother     Heart Disease before age 49  . Glaucoma Father   . Hypertension Father   . Stroke Paternal Grandmother   . Coronary artery disease Paternal Grandmother   . Cerebral aneurysm Brother   . Clotting disorder Sister     Social History   Social History  . Marital Status:  Married    Spouse Name: N/A  . Number of Children: 3  . Years of Education: N/A   Occupational History  . DISABLED    Social History Main Topics  . Smoking status: Never Smoker   . Smokeless tobacco: Never Used  . Alcohol Use: No  . Drug Use: No  . Sexual Activity: No   Other Topics Concern  . None   Social History Narrative     Current outpatient prescriptions:  .  acetaminophen (TYLENOL) 325 MG tablet, Take 2 tablets (650 mg total) by mouth every 4 (four) hours as needed for headache or mild pain., Disp: , Rfl:  .  aspirin EC 81 MG tablet, Take 81 mg by mouth daily., Disp: , Rfl:  .  BAYER MICROLET LANCETS lancets, Use as instructed, Disp: 100 each, Rfl: 3 .  carvedilol (COREG) 6.25  MG tablet, Take 3 tablets (18.75 mg total) by mouth 2 (two) times daily with a meal., Disp: 100 tablet, Rfl: 11 .  CRESTOR 40 MG tablet, TAKE 1 TABLET BY MOUTH EVERY DAY, Disp: 30 tablet, Rfl: 11 .  ferrous sulfate 325 (65 FE) MG tablet, Take 325 mg by mouth daily with breakfast., Disp: , Rfl:  .  furosemide (LASIX) 80 MG tablet, Take 1 tablet (80 mg total) by mouth every morning., Disp: 30 tablet, Rfl: 11 .  Glucose Blood (BAYER BREEZE 2 TEST) DISK, Test blood sugar four times a day, Disp: 200 each, Rfl: 5 .  Multiple Vitamins-Minerals (MULTIVITAL) tablet, Take 1 tablet by mouth daily. , Disp: , Rfl:  .  nitroGLYCERIN (NITROSTAT) 0.4 MG SL tablet, Place 1 tablet (0.4 mg total) under the tongue every 5 (five) minutes as needed for chest pain., Disp: 25 tablet, Rfl: 2 .  NORVASC 5 MG tablet, Take 1 tablet (5 mg total) by mouth daily., Disp: 30 tablet, Rfl: 11 .  olopatadine (PATANOL) 0.1 % ophthalmic solution, Place 1 drop into both eyes 2 (two) times daily., Disp: 5 mL, Rfl: 1 .  PLAVIX 75 MG tablet, TAKE 1 TABLET BY MOUTH EVERY DAY, Disp: 90 tablet, Rfl: 0 .  PREVACID 15 MG capsule, TAKE ONE CAPSULE EVERY DAY, Disp: 90 capsule, Rfl: 0 .  spironolactone (ALDACTONE) 25 MG tablet, Take 0.5 tablets (12.5 mg total) by mouth daily., Disp: 15 tablet, Rfl: 11 .  ZETIA 10 MG tablet, TAKE 1 TABLET BY MOUTH EVERY DAY, Disp: 90 tablet, Rfl: 0  EXAM:  Filed Vitals:   11/11/15 0932  BP: 124/70  Pulse: 79  Temp: 98.3 F (36.8 C)    Body mass index is 27.52 kg/(m^2).  GENERAL: vitals reviewed and listed above, alert, oriented, appears well hydrated and in no acute distress  HEENT: atraumatic, conjunttiva clear, no obvious abnormalities on inspection of external nose and ears  NECK: no obvious masses on inspection  BREAST: 2-3cm firm mass in L breast at 2 O'clock ~ 3 cm from areola  MS: moves all extremities without noticeable abnormality  PSYCH: pleasant and cooperative, no obvious depression or  anxiety  ASSESSMENT AND PLAN:  Discussed the following assessment and plan:  Breast lump - Plan: MM Digital Diagnostic Unilat L, MM Digital Screening, US BREAST COMPLETE UNI LEFT INC AXILLA  -we discussed possible serious and likely etiologies, workup and treatment, treatment risks and return precautions -STAT orders for eval at breast center - pt instructed to wait on printed appointment info from my scheduler -Patient advised to return or notify a doctor immediately if symptoms worsen or  persist or new concerns arise.  Patient Instructions  BEFORE YOU LEAVE: -wait on details of breast evaluation appointment  Go to get the evaluation of the breast lump as instructed  Make sure to keep your appointment for your physical  Please arrive 15 minutes early for all appointment and call 2 days ahead if any appointments need to be rescheduled.     Penny Benton R.

## 2015-11-11 NOTE — Progress Notes (Signed)
Pre visit review using our clinic review tool, if applicable. No additional management support is needed unless otherwise documented below in the visit note. 

## 2015-11-14 ENCOUNTER — Other Ambulatory Visit: Payer: Self-pay | Admitting: Cardiology

## 2015-11-14 ENCOUNTER — Ambulatory Visit (HOSPITAL_COMMUNITY): Payer: Medicare Other

## 2015-11-17 ENCOUNTER — Ambulatory Visit (HOSPITAL_COMMUNITY): Payer: Medicare Other

## 2015-11-17 ENCOUNTER — Telehealth: Payer: Self-pay | Admitting: Cardiology

## 2015-11-17 NOTE — Telephone Encounter (Signed)
Patient is very high risk from a cardiac standpoint for coronary ischemia intraop and postoperatively.  She has severe native 3 vessel ASCAD and all grafts are occluded except LIMA to LAD which is diffusely diseased distally.  She also has severe LV dysfunction and is at high risk for acute CHF from volume overload.

## 2015-11-17 NOTE — Telephone Encounter (Signed)
New message  Request for surgical clearance:  1. What type of surgery is being performed? Breast Biopsy   2. When is this surgery scheduled? 11/22/2014  3. Are there any medications that need to be held prior to surgery and how long? Plavix and Asprin. Will need she need to come off   4. Name of physician performing surgery? Dr. Christene Slates    5. What is your office phone and fax number?  Fax # [phone] office number [phone]

## 2015-11-17 NOTE — Telephone Encounter (Signed)
Will route this message to Dr Radford Pax and covering nurse for further review and recommendation of medication clearance needed for the pts breast biopsy on 11/23/15.  Once advised, will fax this information back to Dr Christene Slates at [phone].

## 2015-11-18 NOTE — Telephone Encounter (Signed)
Printed and faxed

## 2015-11-21 ENCOUNTER — Telehealth: Payer: Self-pay | Admitting: Cardiology

## 2015-11-21 ENCOUNTER — Ambulatory Visit (HOSPITAL_COMMUNITY): Payer: Medicare Other

## 2015-11-21 NOTE — Telephone Encounter (Signed)
New message      Calling to ask you to refax clearance to stop plavix and aspirin to [phone].  They did not get it.

## 2015-11-21 NOTE — Telephone Encounter (Signed)
Farley Ly // medical records was contacted, she re faxed.

## 2015-11-23 ENCOUNTER — Ambulatory Visit (HOSPITAL_COMMUNITY): Payer: Medicare Other

## 2015-11-25 ENCOUNTER — Ambulatory Visit (HOSPITAL_COMMUNITY): Payer: Medicare Other

## 2015-11-28 ENCOUNTER — Ambulatory Visit (HOSPITAL_COMMUNITY): Payer: Medicare Other

## 2015-11-30 ENCOUNTER — Ambulatory Visit (HOSPITAL_COMMUNITY): Payer: Medicare Other

## 2015-12-01 ENCOUNTER — Ambulatory Visit (INDEPENDENT_AMBULATORY_CARE_PROVIDER_SITE_OTHER): Payer: Medicare Other | Admitting: Family Medicine

## 2015-12-01 ENCOUNTER — Telehealth: Payer: Self-pay | Admitting: Family Medicine

## 2015-12-01 ENCOUNTER — Encounter: Payer: Medicare Other | Admitting: Family Medicine

## 2015-12-01 DIAGNOSIS — R69 Illness, unspecified: Secondary | ICD-10-CM

## 2015-12-01 NOTE — Progress Notes (Deleted)
No show

## 2015-12-01 NOTE — Telephone Encounter (Signed)
Pt received a call from ?nurse.concerning getting blood work to check her iron. Pt had a cpx sch for today and was no show

## 2015-12-01 NOTE — Progress Notes (Signed)
NO SHOW for annual exam. She has had 3 no shows in < 1 year. Will have office manager mail letter. Also will have assistant advise her to reschedule hematology visit for anemia which she also no showed. On ROC prior to appt:  DM w/ PVD and CKD: -meds: no medications currently, reports on amrayl, glipizide and insulin in the past, but then changed her diet and has been off all medications for 1 year -reports: -eye exam:  -foot exam: done -denies:   HLD/Obesity: -meds: zetia 10 mg, crestor 40mg   CAD s/p CABG, HTN, Carotid art dz, CsCHF, PVD: -per cardiology notes, severe multi-vessel disease, severe CHF with EF 20-25 % and felt not a surgical candidate and opted for medical management -per cardiology notes she has had poor compliance with medications and refuses to take any non brand name medications -meds: asa, plavix, lasix, coreg, statin, norvasc, aldactone, zetia  -denies:  GERD: Meds: prevacid 15mg  rarely for bad acid -denies:   Iron def anemia: -chronic -meds: ferrous sulfate with prior PCP -I advised hematology evaluation at our initial visit; referral was placed, she NO Showed the appointment -per cardiology notes she told them she was not aware she had anemia

## 2015-12-01 NOTE — Telephone Encounter (Signed)
I called the pt back and informed her I gave her the wrong number as the number in the referral was for [facility].  I advised she call ph#[phone] and ask for Tiffany per Neoma Laming.

## 2015-12-02 ENCOUNTER — Encounter: Payer: Self-pay | Admitting: Family Medicine

## 2015-12-02 ENCOUNTER — Encounter: Payer: Medicare Other | Admitting: Physician Assistant

## 2015-12-02 ENCOUNTER — Encounter: Payer: Self-pay | Admitting: Family

## 2015-12-02 ENCOUNTER — Ambulatory Visit (HOSPITAL_COMMUNITY): Payer: Medicare Other

## 2015-12-02 ENCOUNTER — Telehealth: Payer: Self-pay | Admitting: Cardiology

## 2015-12-02 NOTE — Telephone Encounter (Signed)
New Message:  Bailey Mech is a Software engineer and she called in stating that the pt came into the pharmacy stating that the Isosorbide and Hydralazine that was prescribed for her in the hospital is making her sick, mainly because it is generic and ALL generic medications make her sick. Can you assist this pharmacist in  alternative medications.

## 2015-12-02 NOTE — Progress Notes (Signed)
This encounter was created in error - please disregard.

## 2015-12-05 ENCOUNTER — Ambulatory Visit (HOSPITAL_COMMUNITY): Payer: Medicare Other

## 2015-12-06 NOTE — Telephone Encounter (Signed)
Left message to call back to verify medications prior to calling in name brand only.

## 2015-12-07 ENCOUNTER — Ambulatory Visit (HOSPITAL_COMMUNITY): Payer: Medicare Other

## 2015-12-08 ENCOUNTER — Ambulatory Visit (HOSPITAL_COMMUNITY): Payer: Medicare Other

## 2015-12-08 NOTE — Telephone Encounter (Signed)
Left message for patient to call back  

## 2015-12-09 ENCOUNTER — Encounter: Payer: Self-pay | Admitting: Family

## 2015-12-09 ENCOUNTER — Ambulatory Visit (HOSPITAL_COMMUNITY)
Admission: RE | Admit: 2015-12-09 | Discharge: 2015-12-09 | Disposition: A | Discharge status: Home / Self Care | Payer: Medicare Other | Source: Ambulatory Visit | Attending: Family | Admitting: Family

## 2015-12-09 ENCOUNTER — Ambulatory Visit (INDEPENDENT_AMBULATORY_CARE_PROVIDER_SITE_OTHER): Payer: Medicare Other | Admitting: Family Medicine

## 2015-12-09 ENCOUNTER — Ambulatory Visit (INDEPENDENT_AMBULATORY_CARE_PROVIDER_SITE_OTHER): Payer: Medicare Other | Admitting: Family

## 2015-12-09 ENCOUNTER — Encounter: Payer: Self-pay | Admitting: Family Medicine

## 2015-12-09 ENCOUNTER — Ambulatory Visit (HOSPITAL_COMMUNITY): Payer: Medicare Other

## 2015-12-09 VITALS — BP 120/64 | HR 68 | Temp 97.6°F | Ht 63.0 in | Wt 150.8 lb

## 2015-12-09 VITALS — BP 124/62 | HR 68 | Temp 97.5°F | Resp 16 | Ht 63.0 in | Wt 152.0 lb

## 2015-12-09 DIAGNOSIS — Z0000 Encounter for general adult medical examination without abnormal findings: Secondary | ICD-10-CM | POA: Diagnosis not present

## 2015-12-09 DIAGNOSIS — D649 Anemia, unspecified: Secondary | ICD-10-CM | POA: Diagnosis not present

## 2015-12-09 DIAGNOSIS — I6523 Occlusion and stenosis of bilateral carotid arteries: Secondary | ICD-10-CM

## 2015-12-09 DIAGNOSIS — E782 Mixed hyperlipidemia: Secondary | ICD-10-CM

## 2015-12-09 DIAGNOSIS — E1151 Type 2 diabetes mellitus with diabetic peripheral angiopathy without gangrene: Secondary | ICD-10-CM | POA: Diagnosis not present

## 2015-12-09 DIAGNOSIS — Z9889 Other specified postprocedural states: Secondary | ICD-10-CM

## 2015-12-09 DIAGNOSIS — Z23 Encounter for immunization: Secondary | ICD-10-CM | POA: Diagnosis not present

## 2015-12-09 DIAGNOSIS — Z48812 Encounter for surgical aftercare following surgery on the circulatory system: Secondary | ICD-10-CM

## 2015-12-09 DIAGNOSIS — I10 Essential (primary) hypertension: Secondary | ICD-10-CM

## 2015-12-09 DIAGNOSIS — R899 Unspecified abnormal finding in specimens from other organs, systems and tissues: Secondary | ICD-10-CM

## 2015-12-09 LAB — BASIC METABOLIC PANEL
BUN: 20 mg/dL (ref 6–23)
CO2: 30 meq/L (ref 19–32)
Calcium: 9.7 mg/dL (ref 8.4–10.5)
Chloride: 99 mEq/L (ref 96–112)
Creatinine, Ser: 1.03 mg/dL (ref 0.40–1.20)
GFR: 68.72 mL/min (ref >60.00)
Glucose, Bld: 91 mg/dL (ref 70–99)
POTASSIUM: 4 meq/L (ref 3.5–5.1)
Sodium: 138 mEq/L (ref 135–145)

## 2015-12-09 LAB — LIPID PANEL
Cholesterol: 108 mg/dL (ref 0–200)
HDL: 34.5 mg/dL — AB (ref >39.00)
LDL CALC: 63 mg/dL (ref 0–99)
NonHDL: 73.57
Total CHOL/HDL Ratio: 3
Triglycerides: 52 mg/dL (ref 0.0–149.0)
VLDL: 10.4 mg/dL (ref 0.0–40.0)

## 2015-12-09 LAB — MICROALBUMIN / CREATININE URINE RATIO
CREATININE, U: 36.4 mg/dL
MICROALB/CREAT RATIO: 4.1 mg/g (ref 0.0–30.0)
Microalb, Ur: 1.5 mg/dL (ref 0.0–1.9)

## 2015-12-09 NOTE — Telephone Encounter (Signed)
She has severe LV dysfunction and needs to be on CHF meds - please set her up in HTN clinic to see what options for meds we have

## 2015-12-09 NOTE — Telephone Encounter (Signed)
Patient st she is no longer taking isosorbide because it no longer exists in name brand. She stopped about a week ago. Patient is no longer taking hydralazine. Patient st she never started aldactone (this was prescribed 12/22 at OV with Melina Copa).  She st she "is feeling great." She wants to know from Dr. Radford Pax if she still needs to start the aldactone.  To Dr. Radford Pax for medication recommendations.  Called [facility]. Confirmed medication list and informed them patient is no longer taking hydralazine or isosorbide. Instructed them to "flag her" for name brands only.

## 2015-12-09 NOTE — Patient Instructions (Signed)
Stroke Prevention Some medical conditions and behaviors are associated with an increased chance of having a stroke. You may prevent a stroke by making healthy choices and managing medical conditions. HOW CAN I REDUCE MY RISK OF HAVING A STROKE?   Stay physically active. Get at least 30 minutes of activity on most or all days.  Do not smoke. It may also be helpful to avoid exposure to secondhand smoke.  Limit alcohol use. Moderate alcohol use is considered to be:  No more than 2 drinks per day for men.  No more than 1 drink per day for nonpregnant women.  Eat healthy foods. This involves:  Eating 5 or more servings of fruits and vegetables a day.  Making dietary changes that address high blood pressure (hypertension), high cholesterol, diabetes, or obesity.  Manage your cholesterol levels.  Making food choices that are high in fiber and low in saturated fat, trans fat, and cholesterol may control cholesterol levels.  Take any prescribed medicines to control cholesterol as directed by your health care provider.  Manage your diabetes.  Controlling your carbohydrate and sugar intake is recommended to manage diabetes.  Take any prescribed medicines to control diabetes as directed by your health care provider.  Control your hypertension.  Making food choices that are low in salt (sodium), saturated fat, trans fat, and cholesterol is recommended to manage hypertension.  Ask your health care provider if you need treatment to lower your blood pressure. Take any prescribed medicines to control hypertension as directed by your health care provider.  If you are 18-39 years of age, have your blood pressure checked every 3-5 years. If you are 40 years of age or older, have your blood pressure checked every year.  Maintain a healthy weight.  Reducing calorie intake and making food choices that are low in sodium, saturated fat, trans fat, and cholesterol are recommended to manage  weight.  Stop drug abuse.  Avoid taking birth control pills.  Talk to your health care provider about the risks of taking birth control pills if you are over 35 years old, smoke, get migraines, or have ever had a blood clot.  Get evaluated for sleep disorders (sleep apnea).  Talk to your health care provider about getting a sleep evaluation if you snore a lot or have excessive sleepiness.  Take medicines only as directed by your health care provider.  For some people, aspirin or blood thinners (anticoagulants) are helpful in reducing the risk of forming abnormal blood clots that can lead to stroke. If you have the irregular heart rhythm of atrial fibrillation, you should be on a blood thinner unless there is a good reason you cannot take them.  Understand all your medicine instructions.  Make sure that other conditions (such as anemia or atherosclerosis) are addressed. SEEK IMMEDIATE MEDICAL CARE IF:   You have sudden weakness or numbness of the face, arm, or leg, especially on one side of the body.  Your face or eyelid droops to one side.  You have sudden confusion.  You have trouble speaking (aphasia) or understanding.  You have sudden trouble seeing in one or both eyes.  You have sudden trouble walking.  You have dizziness.  You have a loss of balance or coordination.  You have a sudden, severe headache with no known cause.  You have new chest pain or an irregular heartbeat. Any of these symptoms may represent a serious problem that is an emergency. Do not wait to see if the symptoms will   go away. Get medical help at once. Call your local emergency services (911 in [place] Do not drive yourself to the hospital.   This information is not intended to replace advice given to you by your health care provider. Make sure you discuss any questions you have with your health care provider.   Document Released: 11/22/2004 Document Revised: 11/05/2014 Document Reviewed:  04/17/2013 Elsevier Interactive Patient Education 2016 [employer]  

## 2015-12-09 NOTE — Progress Notes (Signed)
Pre visit review using our clinic review tool, if applicable. No additional management support is needed unless otherwise documented below in the visit note. 

## 2015-12-09 NOTE — Progress Notes (Signed)
Chief Complaint: Extracranial Carotid Artery Stenosis   History of Present Illness  Penny James is a 65 y.o. female patient of Dr. Donnetta Hutching who presents today for follow up s/p right carotid endarterectomy on 02/18/2012.  The patient denies any history of TIA or stroke symptoms. Specifically the patient denies a history of amaurosis fugax or monocular blindness, unilateralfacial drooping, hemiparesis, or receptive or expressive aphasia.   The patient denies claudication symptoms with walking, denies non healing wounds.   She was hospitalized in December 2016 with exacerbation of CHF.  Pt denies any kidney problems.  Pt Diabetic: Yes, states she states she takes Amaryl prn for FBS >100. Pt smoker: non-smoker  Pt meds include: Statin : Yes ASA: Yes Other anticoagulants/antiplatelets: Plavix   09/28/14 visit with Dr. Donnetta Hutching:  I did review her carotid CT angiogram of her neck today. This shows proximal 45% stenosis of her distal endarterectomy site with no critical stenosis. She's had no change in her left carotid anatomy since her prior CT angiogram 2013 with a predicted 70% stenosis. She has a known left vertebral artery occlusion. I discussed this at length with Penny James explain this does not put her any significant increased risk for stroke and therefore would continue duplex follow-up. I did explain that the degree of calcification makes ultrasound somewhat difficult to predict her level of stenosis but we now have a baseline ultrasound and CT angiogram for comparison. She'll notify us should she develop any neurologic deficits. Otherwise we will see her again in 6 months with carotid duplex.    Past Medical History  Diagnosis Date  . Hyperlipidemia   . Arthritis   . Anemia   . Chronic combined systolic and diastolic CHF (congestive heart failure) ([place])     a. EF 55% by cath 2010 but in 09/2015 found to be 20%, with mod-severe MR, grade 2 DD.  . Bipolar affective  disorder ([place])   . GERD (gastroesophageal reflux disease)   . Obesity   . Peripheral neuropathy ([place])   . 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 CAD (coronary artery disease)     a. CABG in 2002. b. Cath 09/2015: poor targets, not a good surgical candidate, high risk PCI reserved for recurrent symptoms.  . Carotid artery stenosis     60-79% bilateral ICA stenosis s/p Right CEA 2013  followed by Dr. Donnetta Hutching  . Mitral regurgitation     a. Mod-severe by echo 09/2015.  . Ischemic cardiomyopathy   . CKD (chronic kidney disease), stage III     Social History Social History  Substance Use Topics  . Smoking status: Never Smoker   . Smokeless tobacco: Never Used  . Alcohol Use: No    Family History Family History  Problem Relation Age of Onset  . Heart disease Brother     Heart Disease before age 49  . Glaucoma Father   . Hypertension Father   . Stroke Paternal Grandmother   . Coronary artery disease Paternal Grandmother   . Cerebral aneurysm Brother   . Clotting disorder Sister     Surgical History Past Surgical History  Procedure Laterality Date  . Abdominal hysterectomy  1984  . Sp pta renal / visc  artery  2003    with stent of left renal artery  . Tonsillectomy      AS  A CHILD  . Endarterectomy  02/18/2012    rocedure: ENDARTERECTOMY CAROTID;  Surgeon: Rosetta Posner, MD;  Location: [facility] OR;  Service: Vascular;  Laterality: Right;  Right Carotid endarterectomy with Dacron patch angioplasty with resection of internal carotid artery  . Coronary artery bypass graft      3 vessel coronary disease S?P CABG with widely patent grafts by cath in 2005 and distal disease past the graft insertion sites.  . Cardiac catheterization  03/25/09    Occluded SVG to intermediate and SVG to OM/posterior descending artery with PTCA left circ 6/10 s/p PTCA  stent in SVG to OM/PDA, 07/2002 s/p PCI of left circ 03/2009  . Cardiac catheterization N/A 10/05/2015    Procedure: Left Heart Cath and Cors/Grafts Angiography;  Surgeon: Troy Sine, MD;  Location: [facility];  Service: Cardiovascular;  Laterality: N/A;    Allergies  Allergen Reactions  . Penicillins Rash    Has patient had a PCN reaction causing immediate rash, facial/tongue/throat swelling, SOB or lightheadedness with hypotension: Yes Has patient had a PCN reaction causing severe rash involving mucus membranes or skin necrosis: Yes Has patient had a PCN reaction that required hospitalization Yes- admitted to hospital Has patient had a PCN reaction occurring within the last 10 years: No, childhood allergy If all of the above answers are "NO", then may proceed with Cephalosporin use.   . Protamine     Severe hypotension Internal bleeding around heart  . Amlodipine Swelling    Causes swelling in higher dosage    Current Outpatient Prescriptions  Medication Sig Dispense Refill  . acetaminophen (TYLENOL) 325 MG tablet Take 2 tablets (650 mg total) by mouth every 4 (four) hours as needed for headache or mild pain.    [name] aspirin EC 81 MG tablet Take 81 mg by mouth daily.    [name] BAYER MICROLET LANCETS lancets Use as instructed 100 each 3  . carvedilol (COREG) 6.25 MG tablet Take 3 tablets (18.75 mg total) by mouth 2 (two) times daily with a meal. 100 tablet 11  . CRESTOR 40 MG tablet TAKE 1 TABLET BY MOUTH EVERY DAY 30 tablet 11  . ferrous sulfate 325 (65 FE) MG tablet Take 325 mg by mouth daily with breakfast.    . furosemide (LASIX) 80 MG tablet Take 1 tablet (80 mg total) by mouth every morning. 30 tablet 11  . Glucose Blood (BAYER BREEZE 2 TEST) DISK Test blood sugar four times a day 200 each 5  . Multiple Vitamins-Minerals (MULTIVITAL) tablet Take 1 tablet by mouth daily.     . nitroGLYCERIN (NITROSTAT) 0.4 MG SL tablet Place 1 tablet (0.4 mg total) under the tongue every 5  (five) minutes as needed for chest pain. 25 tablet 2  . NORVASC 5 MG tablet Take 1 tablet (5 mg total) by mouth daily. 30 tablet 11  . olopatadine (PATANOL) 0.1 % ophthalmic solution Place 1 drop into both eyes 2 (two) times daily. 5 mL 1  . PLAVIX 75 MG tablet TAKE 1 TABLET BY MOUTH EVERY DAY 90 tablet 0  . PREVACID 15 MG capsule TAKE ONE CAPSULE EVERY DAY 90 capsule 0  . spironolactone (ALDACTONE) 25 MG tablet Take 0.5 tablets (12.5 mg total) by mouth daily. 15 tablet 11  . ZETIA 10 MG tablet TAKE 1 TABLET BY MOUTH EVERY DAY 90 tablet 0   No current facility-administered medications for this visit.    Review of Systems : See HPI for pertinent positives and  negatives.  Physical Examination  Filed Vitals:   12/09/15 1055 12/09/15 1057  BP: 129/67 124/62  Pulse: 68 68  Temp: 97.5 F (36.4 C)   TempSrc: Oral   Resp: 16   Height: 5\' 3"  (1.6 m)   Weight: 152 lb (68.947 kg)   SpO2: 100%    Body mass index is 26.93 kg/(m^2).    General: WDWN female in NAD GAIT: normal Eyes: PERRLA Pulmonary: Non-labored, rales in right base Cardiac: regular rhythm,no detected murmur.  VASCULAR EXAM Carotid Bruits Right Left   negative negative   Aorta is not palpable. Radial pulses are 2+ palpable and equal.      LE Pulses Right Left   POPLITEAL not palpable  not palpable   POSTERIOR TIBIAL not palpable  not palpable    DORSALIS PEDIS  ANTERIOR TIBIAL not palpable  not palpable     Gastrointestinal: soft, nontender, BS WNL, no r/g, no palpable masses.  Musculoskeletal: No muscle atrophy/wasting. M/S 5/5 throughout, Extremities without ischemic changes.  Neurologic: A&O X 3; Appropriate Affect,  Speech is normal CN 2-12 intact, Pain and light touch intact in extremities, Motor exam as listed  above.                Non-Invasive Vascular Imaging CAROTID DUPLEX 12/09/2015   CEREBROVASCULAR DUPLEX EVALUATION    INDICATION: Follow-up carotid disease     PREVIOUS INTERVENTION(S): Right carotid endarterectomy 02/18/2012    DUPLEX EXAM:     RIGHT  LEFT  Peak Systolic Velocities (cm/s) End Diastolic Velocities (cm/s) Plaque LOCATION Peak Systolic Velocities (cm/s) End Diastolic Velocities (cm/s) Plaque  74 15  CCA PROXIMAL 76 22   76 19  CCA MID 144 53 CP  66 20  CCA DISTAL 101 25 HT  92 13  ECA 175 20 HT  142 52 CP ICA PROXIMAL 423 178 CP  111 37 CP ICA MID 303 138 CP  108 35  ICA DISTAL 137 50     NA ICA / CCA Ratio (PSV) NA- Proximal stenosis   Reconstituted  Vertebral Flow Antegrade    Brachial Systolic Pressure (mmHg)   Within normal limits  Subclavian Artery Waveforms Within normal limits     Plaque Morphology:  HM = Homogeneous, HT = Heterogeneous, CP = Calcific Plaque, SP = Smooth Plaque, IP = Irregular Plaque     ADDITIONAL FINDINGS: See impression on following page.      IMPRESSION: 1. Patent right carotid endarterectomy with evidence of 40%-59% restenosis of the distal patch in the internal carotid artery. Plaque is calcific and may underestimate velocities. 2. Evidence of 80%-99% stenosis of the left internal carotid artery. Plaque is densely calcific which may underestimate velocities. Visualization today was better than previous exam 6 months ago. 3. Evidence of >50% stenosis of the mid left common carotid artery. 4. Right vertebral artery appears occluded with reconstitution in the mid cervical segment. 5. Left vertebral artery is antegrade.    Compared to the previous exam:  Diagnostic criteria has changed since previous exam; velocities on the left have increased based on better visualization.      Assessment: OBIANUJU KARIMI is a 65 y.o. female who is s/p right carotid endarterectomy on 02/18/2012. She has no history of stroke or  TIA.  Today's carotid Duplex suggests right carotid endarterectomy with evidence of 40%-59% restenosis of the distal patch in the internal carotid artery. Plaque is calcific and may underestimate velocities. Evidence of 80%-99% stenosis of the left internal carotid artery.  Plaque is densely calcific which may underestimate velocities. Visualization today was better than previous exam 6 months ago. Evidence of >50% stenosis of the mid left common carotid artery. Right vertebral artery appears occluded with reconstitution in the mid cervical segment. Left vertebral artery is antegrade. Diagnostic criteria has changed since previous exam; velocities on the left have increased based on better visualization.   09/28/14 CTA of neck showed 45% stenosis of her distal endarterectomy site with no critical stenosis. She's had no change in her left carotid anatomy since her prior CT angiogram 2013 with a predicted 70% stenosis.  Plan: Follow-up with Dr. Donnetta Hutching ASAP to discuss the possibility of left CEA, but pt states at her her last hospitalization for CHF exacerbation in December 2016, she was told that she would not wake up from a needed redo of her CABG.   I discussed in depth with the patient the nature of atherosclerosis, and emphasized the importance of maximal medical management including strict control of blood pressure, blood glucose, and lipid levels, obtaining regular exercise, and continued cessation of smoking.  The patient is aware that without maximal medical management the underlying atherosclerotic disease process will progress, limiting the benefit of any interventions. The patient was given information about stroke prevention and what symptoms should prompt the patient to seek immediate medical care. Thank you for allowing [place] to participate in this patient's care.  Penny Chambers, RN, MSN, FNP-C [facility] Office: [phone]  Clinic Physician:  Bridgett Larsson  12/09/2015 10:49 AM

## 2015-12-09 NOTE — Progress Notes (Signed)
Medicare Annual Preventive Care Visit  (initial annual wellness or annual wellness exam)  Penny James is a pleasant 65 yo F with a very complicated PMH with unfortunately very poor compliance with appointments and medical recommendations. She unfortunately is currently undergoing evaluation for likely breast cancer. She has a biopsy coming up. She reports she did this to herself as has never taken care of herself, unfortunately she was busy taking care of others per her report. She is here for her medicare wellness/physical exam.  Concerns and/or follow up today:  DM w/ PVD and CKD: -meds: no medications currently, reports on amrayl, glipizide and insulin in the past, but then changed her diet and has been off all medications for some time -reports continues to exercise, reports continues to work on diet -eye exam: reports swill schedule -foot exam: sees podiatry - reports has appt today -denies: vision changes, wounds  HLD/Obesity: -meds: zetia 10 mg, crestor 40mg   CAD s/p CABG, HTN, Carotid art dz, CsCHF, PVD: -per cardiology notes, severe multi-vessel disease, severe CHF with EF 20-25 % and felt not a surgical candidate and opted for medical management -per cardiology notes she has had poor compliance with medications and refuses to take any non brand name medications -meds: asa, plavix, lasix, coreg, statin, norvasc, aldactone, zetia  -denies:CP, SOB, DOE - reports exercises frequently without symptoms  GERD: Meds: prevacid 15mg  rarely for bad acid  Iron def anemia: -chronic -meds: ferrous sulfate with prior PCP -I advised hematology evaluation at our initial visit; referral was placed, she NO Showed the appointment -per cardiology notes she told them she was not aware she had anemia  ROS: negative for report of fevers, unintentional weight loss, vision changes, vision loss, hearing loss or change, chest pain, sob, hemoptysis, melena, hematochezia, hematuria, genital  discharge or lesions, falls, bleeding or bruising, loc, thoughts of suicide or self harm, memory loss  1.) Patient-completed health risk assessment  - completed and reviewed, see scanned documentation  2.) Review of Medical History: -PMH, PSH, Family History and current specialty and care providers reviewed and updated and listed below  - see scanned in document in chart and below  Past Medical History  Diagnosis Date  . Hyperlipidemia   . Arthritis   . Anemia   . Chronic combined systolic and diastolic CHF (congestive heart failure) ([place])     a. EF 55% by cath 2010 but in 09/2015 found to be 20%, with mod-severe MR, grade 2 DD.  . Bipolar affective disorder ([place])   . GERD (gastroesophageal reflux disease)   . Obesity   . Peripheral neuropathy ([place])   . 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 CAD (coronary artery disease)     a. CABG in 2002. b. Cath 09/2015: poor targets, not a good surgical candidate, high risk PCI reserved for recurrent symptoms.  . Carotid artery stenosis     60-79% bilateral ICA stenosis s/p Right CEA 2013  followed by Dr. Donnetta Hutching  . Mitral regurgitation     a. Mod-severe by echo 09/2015.  . Ischemic cardiomyopathy   . CKD (chronic kidney disease), stage III     Past Surgical History  Procedure Laterality Date  . Abdominal hysterectomy  1984  . Sp pta renal / visc  artery  2003    with stent of left renal artery  .  Tonsillectomy      AS A CHILD  . Endarterectomy  02/18/2012    rocedure: ENDARTERECTOMY CAROTID;  Surgeon: Rosetta Posner, MD;  Location: [facility] OR;  Service: Vascular;  Laterality: Right;  Right Carotid endarterectomy with Dacron patch angioplasty with resection of internal carotid artery  . Coronary artery bypass graft      3 vessel coronary disease S?P CABG with widely patent grafts by cath in 2005 and  distal disease past the graft insertion sites.  . Cardiac catheterization  03/25/09    Occluded SVG to intermediate and SVG to OM/posterior descending artery with PTCA left circ 6/10 s/p PTCA stent in SVG to OM/PDA, 07/2002 s/p PCI of left circ 03/2009  . Cardiac catheterization N/A 10/05/2015    Procedure: Left Heart Cath and Cors/Grafts Angiography;  Surgeon: Troy Sine, MD;  Location: [facility];  Service: Cardiovascular;  Laterality: N/A;    Social History   Social History  . Marital Status: Married    Spouse Name: N/A  . Number of Children: 3  . Years of Education: N/A   Occupational History  . DISABLED    Social History Main Topics  . Smoking status: Never Smoker   . Smokeless tobacco: Never Used  . Alcohol Use: No  . Drug Use: No  . Sexual Activity: No   Other Topics Concern  . Not on file   Social History Narrative    Family History  Problem Relation Age of Onset  . Heart disease Brother     Heart Disease before age 6  . Glaucoma Father   . Hypertension Father   . Stroke Paternal Grandmother   . Coronary artery disease Paternal Grandmother   . Cerebral aneurysm Brother   . Clotting disorder Sister     Current Outpatient Prescriptions on File Prior to Visit  Medication Sig Dispense Refill  . acetaminophen (TYLENOL) 325 MG tablet Take 2 tablets (650 mg total) by mouth every 4 (four) hours as needed for headache or mild pain.    [name] aspirin EC 81 MG tablet Take 81 mg by mouth daily.    [name] BAYER MICROLET LANCETS lancets Use as instructed 100 each 3  . carvedilol (COREG) 6.25 MG tablet Take 3 tablets (18.75 mg total) by mouth 2 (two) times daily with a meal. 100 tablet 11  . CRESTOR 40 MG tablet TAKE 1 TABLET BY MOUTH EVERY DAY 30 tablet 11  . ferrous sulfate 325 (65 FE) MG tablet Take 325 mg by mouth daily with breakfast.    . furosemide (LASIX) 80 MG tablet Take 1 tablet (80 mg total) by mouth every morning. 30 tablet 11  . Glucose Blood (BAYER BREEZE 2  TEST) DISK Test blood sugar four times a day 200 each 5  . Multiple Vitamins-Minerals (MULTIVITAL) tablet Take 1 tablet by mouth daily.     . nitroGLYCERIN (NITROSTAT) 0.4 MG SL tablet Place 1 tablet (0.4 mg total) under the tongue every 5 (five) minutes as needed for chest pain. 25 tablet 2  . NORVASC 5 MG tablet Take 1 tablet (5 mg total) by mouth daily. 30 tablet 11  . olopatadine (PATANOL) 0.1 % ophthalmic solution Place 1 drop into both eyes 2 (two) times daily. 5 mL 1  . PLAVIX 75 MG tablet TAKE 1 TABLET BY MOUTH EVERY DAY 90 tablet 0  . PREVACID 15 MG capsule TAKE ONE CAPSULE EVERY DAY 90 capsule 0  . spironolactone (ALDACTONE) 25 MG tablet Take 0.5 tablets (  12.5 mg total) by mouth daily. 15 tablet 11  . ZETIA 10 MG tablet TAKE 1 TABLET BY MOUTH EVERY DAY 90 tablet 0   No current facility-administered medications on file prior to visit.     3.) Review of functional ability and level of safety:  Any difficulty hearing?  NO  History of falling?  NO  Any trouble with IADLs - using a phone, using transportation, grocery shopping, preparing meals, doing housework, doing laundry, taking medications and managing money? NO  Advance Directives? Yes - notes report in chart  See summary of recommendations in Patient Instructions below.  4.) Physical Exam Filed Vitals:   12/09/15 1140  BP: 120/64  Pulse: 68  Temp: 97.6 F (36.4 C)   Estimated body mass index is 26.72 kg/(m^2) as calculated from the following:   Height as of this encounter: 5\' 3"  (1.6 m).   Weight as of this encounter: 150 lb 12.8 oz (68.402 kg).  EKG (optional): deferred  General: alert, appear well hydrated and in no acute distress  HEENT: visual acuity grossly intact  CV: HRRR  Lungs: CTA bilaterally  Psych: pleasant and cooperative, no obvious depression or anxiety  Cognitive function grossly intact  See patient instructions for recommendations.  Education and counseling regarding the above  review of health provided with a plan for the following: -see scanned patient completed form for further details -fall prevention strategies discussed  -healthy lifestyle discussed -importance and resources for completing advanced directives discussed -see patient instructions below for any other recommendations provided  4)The following written screening schedule of preventive measures were reviewed with assessment and plan made per below, orders and patient instructions:      AAA screening done if applicable     Alcohol screening done     Obesity Screening and counseling done     STI screening (Hep C if born [birth year]) offered and per pt wishes     Tobacco Screening done        Pneumococcal (PPSV23 -one dose after 64, one before if risk factors), influenza yearly and hepatitis B vaccines (if high risk - end stage renal disease, IV drugs, homosexual men, live in home for mentally retarded, hemophilia receiving factors) ASSESSMENT/PLAN: today      Screening mammograph (yearly if >40) ASSESSMENT/PLAN: utd, unfortunately undergoing eval for breast ca      Screening Pap smear/pelvic exam (q2 years) ASSESSMENT/PLAN: n/a, declined - s/p reported total hysterectomy for uterine tear      Colorectal cancer screening (FOBT yearly or flex sig q4y or colonoscopy q10y or barium enema q4y) ASSESSMENT/PLAN: utd or ordered      Diabetes outpatient self-management training services ASSESSMENT/PLAN: utd or done      Bone mass measurements(covered q2y if indicated - estrogen def, osteoporosis, hyperparathyroid, vertebral abnormalities, osteoporosis or steroids) ASSESSMENT/PLAN: deferred for now      Screening for glaucoma(q1y if high risk - diabetes, FH, AA and > 50 or hispanic and > 65) ASSESSMENT/PLAN: utd or advised - she reports will schedule her eye exam      Medical nutritional therapy for individuals with diabetes or renal disease ASSESSMENT/PLAN: see orders      Cardiovascular screening  blood tests (lipids q5y) ASSESSMENT/PLAN: see orders and labs      Diabetes screening tests ASSESSMENT/PLAN: see orders and labs   7.) Summary: -risk factors and conditions per above assessment were discussed and treatment, recommendations and referrals were offered per documentation above and orders and patient instructions.  Medicare annual  wellness visit, initial  Mixed hyperlipidemia - Plan: Lipid panel Benign essential HTN - Plan: Basic metabolic panel -managed by cardiology, labs  DM (diabetes mellitus) type II controlled peripheral vascular disorder ([place]) - Plan: Microalbumin/Creatinine Ratio, Urine -recent hgba1c good -foot exam abnormal, seeing podiatry today  Anemia, unspecified anemia type - Plan: CBC (no diff) -referred to hematology  Need for prophylactic vaccination against Streptococcus pneumoniae (pneumococcus) - Plan: Pneumococcal polysaccharide vaccine 23-valent greater than or equal to 2yo subcutaneous/IM   Breast Mass: -very complicated pt with new dx breast mass, biopsy pending -she is coping well and reports takes one day at a time, supported, will need cardiology preop eval if any surgery planned  Patient Instructions  BEFORE YOU LEAVE: -pneumococcal vaccine -labs -follow up in 4-5 months  -We have ordered labs or studies at this visit. It can take up to 1-2 weeks for results and processing. We will contact you with instructions IF your results are abnormal. Normal results will be released to your [facility]. If you have not heard from [place] or can not find your results in [facility] in 2 weeks please contact our office.  We recommend the following healthy lifestyle measures: - eat a healthy whole foods diet consisting of regular small meals composed of vegetables, fruits, beans, nuts, seeds, healthy meats such as white chicken and fish and whole grains.  - avoid sweets, white starchy foods, fried foods, fast food, processed foods, sodas, red meet and other  fattening foods.  - get a least 150-300 minutes of aerobic exercise per week.   -Schedule eye exam

## 2015-12-09 NOTE — Patient Instructions (Signed)
BEFORE YOU LEAVE: -pneumococcal vaccine -labs -follow up in 4-5 months  -We have ordered labs or studies at this visit. It can take up to 1-2 weeks for results and processing. We will contact you with instructions IF your results are abnormal. Normal results will be released to your [facility]. If you have not heard from [place] or can not find your results in [facility] in 2 weeks please contact our office.  We recommend the following healthy lifestyle measures: - eat a healthy whole foods diet consisting of regular small meals composed of vegetables, fruits, beans, nuts, seeds, healthy meats such as white chicken and fish and whole grains.  - avoid sweets, white starchy foods, fried foods, fast food, processed foods, sodas, red meet and other fattening foods.  - get a least 150-300 minutes of aerobic exercise per week.   -Schedule eye exam

## 2015-12-10 LAB — CBC
HEMATOCRIT: 34.5 % — AB (ref 36.0–46.0)
HEMOGLOBIN: 11 g/dL — AB (ref 12.0–15.0)
MCHC: 31.9 g/dL (ref 30.0–36.0)
MCV: 87.1 fl (ref 78.0–100.0)
Platelets: 171 10*3/uL (ref 150.0–400.0)
RBC: 3.96 Mil/uL (ref 3.87–5.11)
RDW: 14.9 % (ref 11.5–15.5)
WBC: 3.3 10*3/uL — AB (ref 4.0–10.5)

## 2015-12-12 ENCOUNTER — Ambulatory Visit (HOSPITAL_COMMUNITY): Payer: Medicare Other

## 2015-12-12 NOTE — Telephone Encounter (Signed)
Left message to call back  

## 2015-12-13 MED ORDER — POTASSIUM CHLORIDE CRYS ER 20 MEQ PO TBCR: 20.0000 meq | EXTENDED_RELEASE_TABLET | Freq: Every day | ORAL | Status: DC

## 2015-12-13 NOTE — Telephone Encounter (Signed)
Scheduled patient 2/16 at 1100 in the [facility].  Patient grateful for call.

## 2015-12-13 NOTE — Addendum Note (Signed)
Addended by: Agnes Lawrence on: 12/13/2015 08:56 AM   Modules accepted: Orders

## 2015-12-14 ENCOUNTER — Ambulatory Visit (HOSPITAL_COMMUNITY): Payer: Medicare Other

## 2015-12-14 ENCOUNTER — Ambulatory Visit: Payer: Medicare Other | Admitting: Cardiology

## 2015-12-15 ENCOUNTER — Ambulatory Visit (INDEPENDENT_AMBULATORY_CARE_PROVIDER_SITE_OTHER): Payer: Medicare Other | Admitting: Pharmacist

## 2015-12-15 VITALS — BP 120/58 | HR 73

## 2015-12-15 DIAGNOSIS — I10 Essential (primary) hypertension: Secondary | ICD-10-CM

## 2015-12-15 DIAGNOSIS — I5023 Acute on chronic systolic (congestive) heart failure: Secondary | ICD-10-CM

## 2015-12-15 MED ORDER — ALDACTONE 25 MG PO TABS: 12.5000 mg | ORAL_TABLET | Freq: Every day | ORAL | Status: DC

## 2015-12-15 MED ORDER — ZESTRIL 2.5 MG PO TABS: 2.5000 mg | ORAL_TABLET | Freq: Every day | ORAL | Status: DC

## 2015-12-15 NOTE — Progress Notes (Signed)
Patient ID: Penny James                 DOB: 23-Jun-1951, 65 yo                         MRN: [id number]     HPI: Penny James is a 65 y.o. female referred by Penny James to HTN clinic for management of HF medications. 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 Of note, patient refuses to take any generic medication because she thinks they don't work.  Pt thinks that she was prescribed Avapro recently however she never started taking this. A PA for brand lisinopril was also completed last month although it does not look like an rx was ever sent in. Advised her not to start Avapro since PA for brand lisinopril was already completed last month and she does not need ARB therapy on top of ACEi.  Also reports that her PCP started her on potassium 90meq a day or two ago. She complains that the pills are too big so she has to cut them in half. Of note, her K was fine on 2/10 at 4.0.  Current HTN/HF meds: amlodipine 5mg , carvedilol 18.75mg  BID, Lasix 80mg  am Previously tried: never started taking spironolactone or Avapro that were prescribed at some point in the past. PA for brand lisinopril was completed last month but do not see that rx was ever sent. BP goal: pt consistently ~120/80 - goal is to avoid hypotension while maximizing HF med regimen  Family History: The patient's family history includes Cerebral aneurysm in her brother; Clotting disorder in her sister; Coronary artery disease in her paternal grandmother; Glaucoma in her father; Heart disease in her brother; Hypertension in her father; Stroke in her paternal grandmother.  Social History: The patient  reports that she has never smoked. She has never used smokeless tobacco. She reports that she does not drink alcohol or use illicit drugs.   Home BP readings: 120s/60s  Wt Readings from Last 3  Encounters:  12/09/15 150 lb 12.8 oz (68.402 kg)  12/09/15 152 lb (68.947 kg)  11/11/15 155 lb 4.8 oz (70.444 kg)   BP Readings from Last 3 Encounters:  12/09/15 120/64  12/09/15 124/62  11/11/15 124/70   Pulse Readings from Last 3 Encounters:  12/09/15 68  12/09/15 68  11/11/15 79    Renal function: Estimated Creatinine Clearance: 49.2 mL/min (by C-G formula based on Cr of 1.03).  Past Medical History  Diagnosis Date  . Hyperlipidemia   . Arthritis   . Anemia   . Chronic combined systolic and diastolic CHF (congestive heart failure) ([place])     a. EF 55% by cath 2010 but in 09/2015 found to be 20%, with mod-severe MR, grade 2 DD.  . Bipolar affective disorder ([place])   . GERD (gastroesophageal reflux disease)   . Obesity   . Peripheral neuropathy ([place])   . 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 CAD (coronary artery disease)     a. CABG in 2002. b. Cath 09/2015: poor targets, not a good surgical candidate, high risk PCI reserved for recurrent symptoms.  . Carotid artery stenosis  60-79% bilateral ICA stenosis s/p Right CEA 2013  followed by Dr. Donnetta Hutching  . Mitral regurgitation     a. Mod-severe by echo 09/2015.  . Ischemic cardiomyopathy   . CKD (chronic kidney disease), stage III     Current Outpatient Prescriptions on File Prior to Visit  Medication Sig Dispense Refill  . acetaminophen (TYLENOL) 325 MG tablet Take 2 tablets (650 mg total) by mouth every 4 (four) hours as needed for headache or mild pain.    [name] aspirin EC 81 MG tablet Take 81 mg by mouth daily.    [name] BAYER MICROLET LANCETS lancets Use as instructed 100 each 3  . carvedilol (COREG) 6.25 MG tablet Take 3 tablets (18.75 mg total) by mouth 2 (two) times daily with a meal. 100 tablet 11  . CRESTOR 40 MG tablet TAKE 1 TABLET BY MOUTH EVERY DAY 30 tablet 11  . ferrous  sulfate 325 (65 FE) MG tablet Take 325 mg by mouth daily with breakfast.    . furosemide (LASIX) 80 MG tablet Take 1 tablet (80 mg total) by mouth every morning. 30 tablet 11  . Glucose Blood (BAYER BREEZE 2 TEST) DISK Test blood sugar four times a day 200 each 5  . Multiple Vitamins-Minerals (MULTIVITAL) tablet Take 1 tablet by mouth daily.     . nitroGLYCERIN (NITROSTAT) 0.4 MG SL tablet Place 1 tablet (0.4 mg total) under the tongue every 5 (five) minutes as needed for chest pain. 25 tablet 2  . olopatadine (PATANOL) 0.1 % ophthalmic solution Place 1 drop into both eyes 2 (two) times daily. 5 mL 1  . PLAVIX 75 MG tablet TAKE 1 TABLET BY MOUTH EVERY DAY 90 tablet 0  . PREVACID 15 MG capsule TAKE ONE CAPSULE EVERY DAY 90 capsule 0  . ZETIA 10 MG tablet TAKE 1 TABLET BY MOUTH EVERY DAY 90 tablet 0   No current facility-administered medications on file prior to visit.    Allergies  Allergen Reactions  . Penicillins Rash    Has patient had a PCN reaction causing immediate rash, facial/tongue/throat swelling, SOB or lightheadedness with hypotension: Yes Has patient had a PCN reaction causing severe rash involving mucus membranes or skin necrosis: Yes Has patient had a PCN reaction that required hospitalization Yes- admitted to hospital Has patient had a PCN reaction occurring within the last 10 years: No, childhood allergy If all of the above answers are "NO", then may proceed with Cephalosporin use.   . Protamine     Severe hypotension Internal bleeding around heart  . Amlodipine Swelling    Causes swelling in higher dosage     Assessment/Plan:  1. Hypertension/HFrEF - Patient's BP has been consistently well controlled around 120/80 however she is not maximized on heart failure therapy. Pt was not taking ACEi or spironolactone although it seems like they were previously prescribed at some point. She never filled her hydralazine or isosorbide because she refuses to take generic  medications. Sent rx for brand lisinopril 2.5mg  (PA already approved in January) and spironolactone 12.5mg . If brand spiro not available, would try brand eplerenone. Discontinued amlodipine - ACEi and spiro will replace this for additional cardiac mortality benefit (without risk for lower extremity swelling in HF patient). Also discontinued potassium supplement that she was prescribed yesterday since her K was 4 last week and ACEi/spiro will likely increase K. Rechecking BP and BMET in 1 week, pt will monitor BP in the meantime.  In the future if BP can tolerate ACEi and  spiro addition, would add BiDil for mortality benefit in African American patients since pt would take brand name med.   Marsalis Beaulieu E. Yitta Gongaware, PharmD, [place] [id number], [place], [place] [address] Phone: [phone]; Fax: [phone] 12/15/2015 11:09 AM

## 2015-12-15 NOTE — Patient Instructions (Addendum)
Stop taking your potassium and Norvasc. Pick up prescriptions for Zestril 2.5mg  once daily and Aldactone 25mg  to take 1/2 tablet once daily. Follow up in blood pressure clinic next Friday, 2/24 at 10:45am, we will check your bloodwork then too.

## 2015-12-16 ENCOUNTER — Ambulatory Visit (HOSPITAL_COMMUNITY): Payer: Medicare Other

## 2015-12-16 ENCOUNTER — Encounter: Payer: Self-pay | Admitting: Vascular Surgery

## 2015-12-18 ENCOUNTER — Other Ambulatory Visit: Payer: Self-pay | Admitting: Cardiology

## 2015-12-19 ENCOUNTER — Ambulatory Visit (HOSPITAL_COMMUNITY): Payer: Medicare Other

## 2015-12-19 ENCOUNTER — Telehealth: Payer: Self-pay | Admitting: Cardiology

## 2015-12-19 NOTE — Telephone Encounter (Signed)
Spoke with [facility] pharmacist - fine to dispense 1/2 of 5mg  tab daily since lower strength not made as Zestril brand anymore. Aldactone will need a PA as well. Updated med list.

## 2015-12-19 NOTE — Telephone Encounter (Signed)
They do not have Zestril 2.5 mg,brand name is discontinued. They are using Zestril 5 mg,1/2 tablet daily. She need to notify you of this change.

## 2015-12-21 ENCOUNTER — Ambulatory Visit (HOSPITAL_COMMUNITY): Payer: Medicare Other

## 2015-12-21 ENCOUNTER — Telehealth: Payer: Self-pay | Admitting: *Deleted

## 2015-12-21 ENCOUNTER — Telehealth: Payer: Self-pay | Admitting: Cardiology

## 2015-12-21 ENCOUNTER — Telehealth: Payer: Self-pay

## 2015-12-21 NOTE — Telephone Encounter (Signed)
Pt says she is still waiting oir her prior authorization for Aldactone 25 mg please.

## 2015-12-21 NOTE — Telephone Encounter (Signed)
Prior auth for Aldactone 25mg  (1/2) tab daily sent to [facility].

## 2015-12-21 NOTE — Telephone Encounter (Signed)
Patient informed, Aldactone approved.

## 2015-12-21 NOTE — Telephone Encounter (Signed)
I advised staff to contact pt and advise her of urgency and offer any help that may be needed in scheduling.

## 2015-12-21 NOTE — Telephone Encounter (Signed)
Penny James called from [facility] and left a message on my voicemail to let Dr Maudie Mercury know the pt was referred to their office and seen on 1/19 was scheduled for a biopsy on 1/25 due to suspected malignancy and she has called every Wednesday for the 6th time today to cancel and states she has a family emergency and will reschedule for the next Wednesday.  Penny James stated the radiologist reached out to her and did let her know her family is important but her health is also and wanted to let our office know to see if we could be of any help?  Penny James can be reached at [phone].  Dr Maudie Mercury was informed of this asked that I call the pt to let her know [facility] called with concerns.  I called the pt she stated she has had a lot going on and has rescheduled for March 5th and will not cancel again.  Message forwarded to Dr Maudie Mercury.

## 2015-12-23 ENCOUNTER — Ambulatory Visit (HOSPITAL_COMMUNITY): Payer: Medicare Other

## 2015-12-23 ENCOUNTER — Ambulatory Visit: Payer: Medicare Other | Admitting: Pharmacist

## 2015-12-23 ENCOUNTER — Other Ambulatory Visit: Payer: Medicare Other

## 2015-12-26 ENCOUNTER — Ambulatory Visit (HOSPITAL_COMMUNITY): Payer: Medicare Other

## 2015-12-27 ENCOUNTER — Ambulatory Visit: Payer: Medicare Other | Admitting: Vascular Surgery

## 2015-12-28 ENCOUNTER — Ambulatory Visit (HOSPITAL_COMMUNITY): Payer: Medicare Other

## 2015-12-29 ENCOUNTER — Emergency Department (HOSPITAL_COMMUNITY): Payer: Medicare Other

## 2015-12-29 ENCOUNTER — Observation Stay (HOSPITAL_COMMUNITY)
Admission: EM | Admit: 2015-12-29 | Discharge: 2015-12-30 | Disposition: A | Discharge status: Home / Self Care | Payer: Medicare Other | Attending: Cardiology | Admitting: Cardiology

## 2015-12-29 ENCOUNTER — Ambulatory Visit: Payer: Medicare Other | Admitting: Pharmacist

## 2015-12-29 ENCOUNTER — Encounter (HOSPITAL_COMMUNITY): Payer: Self-pay | Admitting: *Deleted

## 2015-12-29 DIAGNOSIS — I255 Ischemic cardiomyopathy: Secondary | ICD-10-CM | POA: Diagnosis not present

## 2015-12-29 DIAGNOSIS — I5043 Acute on chronic combined systolic (congestive) and diastolic (congestive) heart failure: Secondary | ICD-10-CM | POA: Diagnosis not present

## 2015-12-29 DIAGNOSIS — Z79899 Other long term (current) drug therapy: Secondary | ICD-10-CM | POA: Diagnosis not present

## 2015-12-29 DIAGNOSIS — E785 Hyperlipidemia, unspecified: Secondary | ICD-10-CM | POA: Insufficient documentation

## 2015-12-29 DIAGNOSIS — Z7982 Long term (current) use of aspirin: Secondary | ICD-10-CM | POA: Insufficient documentation

## 2015-12-29 DIAGNOSIS — N179 Acute kidney failure, unspecified: Secondary | ICD-10-CM | POA: Diagnosis present

## 2015-12-29 DIAGNOSIS — I2510 Atherosclerotic heart disease of native coronary artery without angina pectoris: Secondary | ICD-10-CM | POA: Diagnosis not present

## 2015-12-29 DIAGNOSIS — I129 Hypertensive chronic kidney disease with stage 1 through stage 4 chronic kidney disease, or unspecified chronic kidney disease: Secondary | ICD-10-CM | POA: Insufficient documentation

## 2015-12-29 DIAGNOSIS — N183 Chronic kidney disease, stage 3 (moderate): Secondary | ICD-10-CM | POA: Insufficient documentation

## 2015-12-29 DIAGNOSIS — Z7902 Long term (current) use of antithrombotics/antiplatelets: Secondary | ICD-10-CM | POA: Diagnosis not present

## 2015-12-29 DIAGNOSIS — I25708 Atherosclerosis of coronary artery bypass graft(s), unspecified, with other forms of angina pectoris: Secondary | ICD-10-CM | POA: Diagnosis not present

## 2015-12-29 DIAGNOSIS — Z880 Allergy status to penicillin: Secondary | ICD-10-CM | POA: Diagnosis not present

## 2015-12-29 DIAGNOSIS — I25810 Atherosclerosis of coronary artery bypass graft(s) without angina pectoris: Secondary | ICD-10-CM | POA: Diagnosis present

## 2015-12-29 DIAGNOSIS — I509 Heart failure, unspecified: Secondary | ICD-10-CM

## 2015-12-29 DIAGNOSIS — I10 Essential (primary) hypertension: Secondary | ICD-10-CM | POA: Diagnosis not present

## 2015-12-29 DIAGNOSIS — Z955 Presence of coronary angioplasty implant and graft: Secondary | ICD-10-CM | POA: Insufficient documentation

## 2015-12-29 DIAGNOSIS — N189 Chronic kidney disease, unspecified: Secondary | ICD-10-CM

## 2015-12-29 LAB — COMPREHENSIVE METABOLIC PANEL
ALT: 28 U/L (ref 14–54)
AST: 33 U/L (ref 15–41)
Albumin: 4.1 g/dL (ref 3.5–5.0)
Alkaline Phosphatase: 58 U/L (ref 38–126)
Anion gap: 13 (ref 5–15)
BILIRUBIN TOTAL: 1 mg/dL (ref 0.3–1.2)
BUN: 23 mg/dL — ABNORMAL HIGH (ref 6–20)
CHLORIDE: 100 mmol/L — AB (ref 101–111)
CO2: 22 mmol/L (ref 22–32)
CREATININE: 1.21 mg/dL — AB (ref 0.44–1.00)
Calcium: 9.5 mg/dL (ref 8.9–10.3)
GFR, EST AFRICAN AMERICAN: 52 mL/min — AB (ref >60)
GFR, EST NON AFRICAN AMERICAN: 45 mL/min — AB (ref >60)
Glucose, Bld: 216 mg/dL — ABNORMAL HIGH (ref 65–99)
Potassium: 4.2 mmol/L (ref 3.5–5.1)
Sodium: 135 mmol/L (ref 135–145)
TOTAL PROTEIN: 6.5 g/dL (ref 6.5–8.1)

## 2015-12-29 LAB — CBC WITH DIFFERENTIAL/PLATELET
BASOS ABS: 0 10*3/uL (ref 0.0–0.1)
BASOS PCT: 1 %
EOS ABS: 0.1 10*3/uL (ref 0.0–0.7)
Eosinophils Relative: 2 %
HCT: 34.1 % — ABNORMAL LOW (ref 36.0–46.0)
HEMOGLOBIN: 11 g/dL — AB (ref 12.0–15.0)
LYMPHS ABS: 1.2 10*3/uL (ref 0.7–4.0)
Lymphocytes Relative: 23 %
MCH: 26.6 pg (ref 26.0–34.0)
MCHC: 32.3 g/dL (ref 30.0–36.0)
MCV: 82.4 fL (ref 78.0–100.0)
Monocytes Absolute: 0.4 10*3/uL (ref 0.1–1.0)
Monocytes Relative: 7 %
NEUTROS PCT: 67 %
Neutro Abs: 3.5 10*3/uL (ref 1.7–7.7)
Platelets: 160 10*3/uL (ref 150–400)
RBC: 4.14 MIL/uL (ref 3.87–5.11)
RDW: 14.9 % (ref 11.5–15.5)
WBC: 5.1 10*3/uL (ref 4.0–10.5)

## 2015-12-29 LAB — CREATININE, SERUM
Creatinine, Ser: 1.17 mg/dL — ABNORMAL HIGH (ref 0.44–1.00)
GFR calc Af Amer: 55 mL/min — ABNORMAL LOW (ref >60)
GFR calc non Af Amer: 47 mL/min — ABNORMAL LOW (ref >60)

## 2015-12-29 LAB — CBC
HEMATOCRIT: 34.4 % — AB (ref 36.0–46.0)
HEMOGLOBIN: 11.2 g/dL — AB (ref 12.0–15.0)
MCH: 26.4 pg (ref 26.0–34.0)
MCHC: 32.6 g/dL (ref 30.0–36.0)
MCV: 81.1 fL (ref 78.0–100.0)
Platelets: 154 10*3/uL (ref 150–400)
RBC: 4.24 MIL/uL (ref 3.87–5.11)
RDW: 14.7 % (ref 11.5–15.5)
WBC: 3.6 10*3/uL — ABNORMAL LOW (ref 4.0–10.5)

## 2015-12-29 LAB — GLUCOSE, CAPILLARY: Glucose-Capillary: 111 mg/dL — ABNORMAL HIGH (ref 65–99)

## 2015-12-29 LAB — BRAIN NATRIURETIC PEPTIDE: B NATRIURETIC PEPTIDE 5: 1860.2 pg/mL — AB (ref 0.0–100.0)

## 2015-12-29 LAB — PROTIME-INR
INR: 1.15 (ref 0.00–1.49)
PROTHROMBIN TIME: 14.9 s (ref 11.6–15.2)

## 2015-12-29 LAB — I-STAT TROPONIN, ED: TROPONIN I, POC: 0.02 ng/mL (ref 0.00–0.08)

## 2015-12-29 MED ORDER — FUROSEMIDE 10 MG/ML IJ SOLN
40.0000 mg | Freq: Once | INTRAMUSCULAR | Status: AC
  Administered 2015-12-29: 40 mg via INTRAVENOUS
  Filled 2015-12-29: qty 4

## 2015-12-29 MED ORDER — CLOPIDOGREL BISULFATE 75 MG PO TABS
75.0000 mg | ORAL_TABLET | Freq: Every day | ORAL | Status: DC
  Administered 2015-12-30: 75 mg via ORAL
  Filled 2015-12-29 (×2): qty 1

## 2015-12-29 MED ORDER — SODIUM CHLORIDE 0.9% FLUSH: 3.0000 mL | INTRAVENOUS | Status: DC | PRN

## 2015-12-29 MED ORDER — ASPIRIN EC 81 MG PO TBEC
81.0000 mg | DELAYED_RELEASE_TABLET | Freq: Every day | ORAL | Status: DC
  Administered 2015-12-30: 81 mg via ORAL
  Filled 2015-12-29 (×2): qty 1

## 2015-12-29 MED ORDER — SPIRONOLACTONE 25 MG PO TABS
12.5000 mg | ORAL_TABLET | Freq: Every day | ORAL | Status: DC
  Administered 2015-12-29 – 2015-12-30 (×2): 12.5 mg via ORAL
  Filled 2015-12-29 (×2): qty 1

## 2015-12-29 MED ORDER — CARVEDILOL 6.25 MG PO TABS
18.3750 mg | ORAL_TABLET | Freq: Two times a day (BID) | ORAL | Status: DC
  Administered 2015-12-29 – 2015-12-30 (×2): 18.75 mg via ORAL
  Filled 2015-12-29 (×2): qty 1

## 2015-12-29 MED ORDER — EZETIMIBE 10 MG PO TABS
10.0000 mg | ORAL_TABLET | Freq: Every day | ORAL | Status: DC
  Administered 2015-12-29 – 2015-12-30 (×2): 10 mg via ORAL
  Filled 2015-12-29 (×2): qty 1

## 2015-12-29 MED ORDER — ROSUVASTATIN CALCIUM 40 MG PO TABS
40.0000 mg | ORAL_TABLET | Freq: Every day | ORAL | Status: DC
  Administered 2015-12-29: 40 mg via ORAL
  Filled 2015-12-29: qty 1

## 2015-12-29 MED ORDER — HEPARIN SODIUM (PORCINE) 5000 UNIT/ML IJ SOLN: 5000.0000 [IU] | Freq: Three times a day (TID) | INTRAMUSCULAR | Status: DC

## 2015-12-29 MED ORDER — FERROUS SULFATE 325 (65 FE) MG PO TABS
325.0000 mg | ORAL_TABLET | Freq: Every day | ORAL | Status: DC
Start: 2015-12-30 — End: 2015-12-30
  Administered 2015-12-30: 325 mg via ORAL
  Filled 2015-12-29: qty 1

## 2015-12-29 MED ORDER — LISINOPRIL 5 MG PO TABS
5.0000 mg | ORAL_TABLET | Freq: Every day | ORAL | Status: DC
  Administered 2015-12-29 – 2015-12-30 (×2): 5 mg via ORAL
  Filled 2015-12-29 (×3): qty 1

## 2015-12-29 MED ORDER — ONDANSETRON HCL 4 MG/2ML IJ SOLN
4.0000 mg | Freq: Four times a day (QID) | INTRAMUSCULAR | Status: DC | PRN
Start: 2015-12-29 — End: 2015-12-30

## 2015-12-29 MED ORDER — ACETAMINOPHEN 325 MG PO TABS: 650.0000 mg | ORAL_TABLET | ORAL | Status: DC | PRN

## 2015-12-29 MED ORDER — SODIUM CHLORIDE 0.9 % IV SOLN
250.0000 mL | INTRAVENOUS | Status: DC | PRN
Start: 2015-12-29 — End: 2015-12-30

## 2015-12-29 MED ORDER — PANTOPRAZOLE SODIUM 40 MG PO TBEC
40.0000 mg | DELAYED_RELEASE_TABLET | Freq: Every day | ORAL | Status: DC
  Administered 2015-12-29 – 2015-12-30 (×2): 40 mg via ORAL
  Filled 2015-12-29 (×2): qty 1

## 2015-12-29 MED ORDER — SODIUM CHLORIDE 0.9% FLUSH
3.0000 mL | Freq: Two times a day (BID) | INTRAVENOUS | Status: DC
  Administered 2015-12-29 – 2015-12-30 (×2): 3 mL via INTRAVENOUS

## 2015-12-29 NOTE — H&P (Signed)
Cardiologist:  Radford Pax  Chief Complaint: SOB,  chest Pain HPI:   Penny James is an 65 y.o. female with history of chronic combined CHF with recent LV dysfunction diagnosis, CAD s/p CABG 2002, mod-severe MR, HTN s/p renal artery stenting, HLD, bipolar affective disorder, DM, carotid artery disease (R CEA 2013, followed by [facility]), CKD stage III, anemia.  She was admitted in Dec 2016 with SOB, elevated troponin, and worsening CHF. 2D Echo revealed new LV dysfunction wtih EF 20-25%, diffuse HK with inferolateral akinesis, grade 2 DD, mod-severe MR, mod dilated LA, mild pulm HTN. Per notes [place] on 12/7 showed "all saphenous vein grafts are occluded and that the LIMA to LAD is widely patent but LAD is diffusely disease both antegrade and retrograde from the LIMA anastomosis. Native right coronary is totally occluded. Heavily calcified left main 75% stenosis and 90% ostial LAD. The circumflex is large and diffusely diseased distally." She also had moderate-severe mitral regurgitation directed posteriorly from a tethered posterior leaflet. She was not felt to be a surgical candidate (severe protamine reaction, poor targets) but high risk PCI with an assist device (Impella) was felt to be a possible option. The risks and benefits of the procedure were discussed with the pt who elected trial of medical therapy given improvement in symptoms. CHF meds were titrated. D/c Cr 1.24, Hgb 9.2.  She has had several issues taking meds because she won't take generic.    She presents today with SOB and diaphoresis which woke her up at 0230hrs.  +orthopnea/PND, LEE.  She normally sleeps in a recliner, which she says is mostly for comfo   t.  She said she ate [place] food yesterday which is the reason she is having heart failure.  She says overall she feels like she's been doing well since her last appointment in December.  She's taking all her medications as prescribed. Since getting IV Lasix she feels much better. Her  weight in December was 152 pounds She denies ever having any chest pain or pressure. She also denies nausea, vomiting, fever, chest pain, shortness of breath, orthopnea, dizziness, PND, cough, congestion, abdominal pain, hematochezia, melena, lower extremity edema, claudication.  Filed Weights   12/29/15 0619  Weight: 154 lb 3.2 oz (69.945 kg)     Past Medical History  Diagnosis Date  . Hyperlipidemia   . Arthritis   . Anemia   . Chronic combined systolic and diastolic CHF (congestive heart failure) ([place])     a. EF 55% by cath 2010 but in 09/2015 found to be 20%, with mod-severe MR, grade 2 DD.  . Bipolar affective disorder ([place])   . GERD (gastroesophageal reflux disease)   . Obesity   . Peripheral neuropathy ([place])   . 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 CAD (coronary artery disease)     a. CABG in 2002. b. Cath 09/2015: poor targets, not a good surgical candidate, high risk PCI reserved for recurrent symptoms.  . Carotid artery stenosis     60-79% bilateral ICA stenosis s/p Right CEA 2013  followed by Dr. Donnetta Hutching  . Mitral regurgitation     a. Mod-severe by echo 09/2015.  . Ischemic cardiomyopathy   . CKD (chronic kidney disease), stage III     Past Surgical History  Procedure Laterality Date  . Abdominal hysterectomy  1984  .  Sp pta renal / visc  artery  2003    with stent of left renal artery  . Tonsillectomy      AS A CHILD  . Endarterectomy  02/18/2012    rocedure: ENDARTERECTOMY CAROTID;  Surgeon: Rosetta Posner, MD;  [facility];  Service: Vascular;  Laterality: Right;  Right Carotid endarterectomy with Dacron patch angioplasty with resection of internal carotid artery  . Coronary artery bypass graft      3 vessel coronary disease S?P CABG with widely patent grafts by cath in 2005 and distal disease past the graft insertion  sites.  . Cardiac catheterization  03/25/09    Occluded SVG to intermediate and SVG to OM/posterior descending artery with PTCA left circ 6/10 s/p PTCA stent in SVG to OM/PDA, 07/2002 s/p PCI of left circ 03/2009  . Cardiac catheterization N/A 10/05/2015    Procedure: Left Heart Cath and Cors/Grafts Angiography;  Surgeon: Troy Sine, MD;  Location: [place] [facility] LAB;  Service: Cardiovascular;  Laterality: N/A;    Family History  Problem Relation Age of Onset  . Heart disease Brother     Heart Disease before age 40  . Glaucoma Father   . Hypertension Father   . Stroke Paternal Grandmother   . Coronary artery disease Paternal Grandmother   . Cerebral aneurysm Brother   . Clotting disorder Sister    Social History:  reports that she has never smoked. She has never used smokeless tobacco. She reports that she does not drink alcohol or use illicit drugs.  Allergies:  Allergies  Allergen Reactions  . Penicillins Rash    Has patient had a PCN reaction causing immediate rash, facial/tongue/throat swelling, SOB or lightheadedness with hypotension: Yes Has patient had a PCN reaction causing severe rash involving mucus membranes or skin necrosis: Yes Has patient had a PCN reaction that required hospitalization Yes- admitted to hospital Has patient had a PCN reaction occurring within the last 10 years: No, childhood allergy If all of the above answers are "NO", then may proceed with Cephalosporin use.   . Protamine     Severe hypotension Internal bleeding around heart  . Amlodipine Swelling    Causes swelling in higher dosage     (Not in a hospital admission)  Results for orders placed or performed during the hospital encounter of 12/29/15 (from the past 48 hour(s))  Comprehensive metabolic panel     Status: Abnormal   Collection Time: 12/29/15  6:07 AM  Result Value Ref Range   Sodium 135 135 - 145 mmol/L   Potassium 4.2 3.5 - 5.1 mmol/L   Chloride 100 (L) 101 - 111 mmol/L   CO2  22 22 - 32 mmol/L   Glucose, Bld 216 (H) 65 - 99 mg/dL   BUN 23 (H) 6 - 20 mg/dL   Creatinine, Ser 1.21 (H) 0.44 - 1.00 mg/dL   Calcium 9.5 8.9 - 10.3 mg/dL   Total Protein 6.5 6.5 - 8.1 g/dL   Albumin 4.1 3.5 - 5.0 g/dL   AST 33 15 - 41 U/L   ALT 28 14 - 54 U/L   Alkaline Phosphatase 58 38 - 126 U/L   Total Bilirubin 1.0 0.3 - 1.2 mg/dL   GFR calc non Af Amer 45 (L) >60 mL/min   GFR calc Af Amer 52 (L) >60 mL/min    Comment: (NOTE) The eGFR has been calculated using the CKD EPI equation. This calculation has not been validated in all clinical situations. eGFR's  persistently <60 mL/min signify possible Chronic Kidney Disease.    Anion gap 13 5 - 15  CBC with Differential     Status: Abnormal   Collection Time: 12/29/15  6:07 AM  Result Value Ref Range   WBC 5.1 4.0 - 10.5 K/uL   RBC 4.14 3.87 - 5.11 MIL/uL   Hemoglobin 11.0 (L) 12.0 - 15.0 g/dL   HCT 34.1 (L) 36.0 - 46.0 %   MCV 82.4 78.0 - 100.0 fL   MCH 26.6 26.0 - 34.0 pg   MCHC 32.3 30.0 - 36.0 g/dL   RDW 14.9 11.5 - 15.5 %   Platelets 160 150 - 400 K/uL   Neutrophils Relative % 67 %   Neutro Abs 3.5 1.7 - 7.7 K/uL   Lymphocytes Relative 23 %   Lymphs Abs 1.2 0.7 - 4.0 K/uL   Monocytes Relative 7 %   Monocytes Absolute 0.4 0.1 - 1.0 K/uL   Eosinophils Relative 2 %   Eosinophils Absolute 0.1 0.0 - 0.7 K/uL   Basophils Relative 1 %   Basophils Absolute 0.0 0.0 - 0.1 K/uL  Brain natriuretic peptide     Status: Abnormal   Collection Time: 12/29/15  6:07 AM  Result Value Ref Range   B Natriuretic Peptide 1860.2 (H) 0.0 - 100.0 pg/mL  Protime-INR     Status: None   Collection Time: 12/29/15  6:07 AM  Result Value Ref Range   Prothrombin Time 14.9 11.6 - 15.2 seconds   INR 1.15 0.00 - 1.49  I-stat troponin, ED     Status: None   Collection Time: 12/29/15  6:43 AM  Result Value Ref Range   Troponin i, poc 0.02 0.00 - 0.08 ng/mL   Comment 3            Comment: Due to the release kinetics of cTnI, a negative result  within the first hours of the onset of symptoms does not rule out myocardial infarction with certainty. If myocardial infarction is still suspected, repeat the test at appropriate intervals.    Dg Chest 2 View  12/29/2015  CLINICAL DATA:  Acute onset of shortness of breath. Initial encounter. EXAM: CHEST  2 VIEW COMPARISON:  Chest radiograph performed 10/03/2015 FINDINGS: The lungs are well-aerated. Vascular congestion is noted. Small bilateral pleural effusions are seen. Increased interstitial markings raise concern for mild pulmonary edema. No pneumothorax is seen. The heart is mildly enlarged. The patient is status post median sternotomy. No acute osseous abnormalities are seen. IMPRESSION: Vascular congestion and mild cardiomegaly. Small bilateral pleural effusions noted. Increased interstitial markings raise concern for mild pulmonary edema. Electronically Signed   By: Garald Balding M.D.   On: 12/29/2015 07:05    Review of Systems  Constitutional: Positive for diaphoresis. Negative for fever.  HENT: Negative for congestion and sore throat.   Respiratory: Positive for shortness of breath. Negative for cough.   Cardiovascular: Positive for orthopnea, leg swelling and PND. Negative for chest pain and palpitations.  Gastrointestinal: Negative for nausea, vomiting, abdominal pain, blood in stool and melena.  Genitourinary: Negative for hematuria.  Musculoskeletal: Negative for myalgias.  Neurological: Negative for dizziness.  All other systems reviewed and are negative.   Blood pressure 146/68, pulse 84, temperature 97.4 F (36.3 C), temperature source Oral, resp. rate 28, weight 154 lb 3.2 oz (69.945 kg), SpO2 90 %. Physical Exam  Nursing note and vitals reviewed. Constitutional: She is oriented to person, place, and time. She appears well-developed and well-nourished. No distress.  HENT:  Head: Normocephalic and atraumatic.  Eyes: EOM are normal. Pupils are equal, round, and reactive  to light. No scleral icterus.  Neck: Normal range of motion. Neck supple. No JVD present.  Cardiovascular: Normal rate, regular rhythm, S1 normal and S2 normal.   Murmur heard.  Systolic murmur is present with a grade of 2/6  Pulses:      Radial pulses are 2+ on the right side, and 2+ on the left side.       Dorsalis pedis pulses are 2+ on the right side, and 2+ on the left side.  Respiratory: Effort normal and breath sounds normal. She has no wheezes. She has no rales.  GI: Soft. Bowel sounds are normal. She exhibits no distension. There is no tenderness.  Musculoskeletal: She exhibits no edema.  Lymphadenopathy:    She has no cervical adenopathy.  Neurological: She is alert and oriented to person, place, and time. She exhibits normal muscle tone.  Skin: Skin is warm and dry.  Psychiatric: She has a normal mood and affect.     Assessment/Plan Principal Problem:   Acute on chronic combined systolic and diastolic heart failure, NYHA class 1 (HCC) Active Problems:   Benign essential HTN   Acute kidney injury superimposed on CKD [facility])   Coronary artery disease involving coronary bypass graft   Cardiomyopathy, ischemic-new 12/16  Penny James does not appear to be grossly volume overloaded.  She is 2# above weight in December.  BNP is >1800 and CXR shows CHF.  She had her AM home lasix and 60m IV in the ER.  She feels better. Recommend admitting overnight.  Give another IV dose this afternoon and resume PO tomorrow and DC home.  Recheck BNP and BMET in AM.  Continue home meds including lisinopril 2.5, Plavix, Aldactone 12.5 mg, Coreg 18.75 twice a day  HAGER, BRYAN, PA-C 12/29/2015, 9:01 AM   Patient seen and examined. Agree with assessment and plan.  Penny James a very pleasant 65year old African-American female who has history of CAD as well as PVD (status post carotid endarterectomy) and  underwent CABG revascularization surgery in 2002 for severe multivessel CAD.  At  her last catheterization in December 2016, she was found to have severe ischemic cardiopathy with an EF of 20-25% with hypokinesis anterolaterally, focal apical akinesis, and severe hypokinesis inferiorly with 3+ MR.  She had severe native CAD with 75% distal left main stenosis.  There was a 75% ostial LAD stenosis, subtotal ramus intermediate stenosis, 50% ostial circumflex stenosis with 60% AV groove stenosis and distal collateralization to a nondominant RCA.  There was total flush occlusion of the native RCA.  All vein grafts which previously supplied the diagonal, ramus intermediate, and sequentially supplied marginal branch and PDA branch of a dominant circumflex were occluded.  She had a patent LIMA graft which supplied the mid LAD, but there was a distal native LAD stenosis of 99% proximal to the apex.  She has documented occlusion of all vein grafts, but has a patent LIMA graft to LAD.  The patient has been on medical therapy.  There was some discussion concerning possible candidacy for intervention.  She has been followed by Dr. TRadford Pax  She denies any significant recurrent anginal type symptomatology.  Yesterday she had [place] and was unaware of MSG containing sodium.  She developed significant increasing shortness of breath despite trying to sleep in a recliner and is admitted with acute on chronic CHF exacerbation with a BNP at  2883.  In the emergency room.  She has received furosemide 40 mg intravenously with resultant brisk diuresis.  Clinically, she is breathing better.  Physical examination is as above.  However, JVD is approximate 787 m.  She has decreased breath sounds at her bases.  Her rhythm is regular with a 2/6 MR murmur at the apex.  Bowel sounds were present in 4 quadrants.  She had trace to 1+ pitting edema of her ankles bilaterally.  Her ECG has not yet been done and is pending.  We will admit the patient at least overnight for IV diuresis.  ACE inhibitor will be titrated to 5 mg  lisinopril and she may benefit from further titration of spironolactone to 12.5 g twice a day rather than just daily.  She does have several myocardial vascular distributions at risk for ischemia and is possible some of her symptomatology may also be contributed by ischemia without chest pain.  She may benefit from ultimate initiation of low-dose nitrates if her blood pressure can tolerate the above medical changes.  She is caring for her debilitated husband and wishes to return home as soon as possible.  Further recommendations will be made based on clinical response to above therapy.   Troy Sine, MD, [facility] 12/29/2015 10:21 AM

## 2015-12-29 NOTE — Progress Notes (Addendum)
Pt had 6 beat run of PVCs. Pt asymptomatic resting in chair. Paged PA. Will continue to monitor.

## 2015-12-29 NOTE — Progress Notes (Signed)
PA says pt is fine. No new orders. Will continue to monitor.

## 2015-12-29 NOTE — ED Provider Notes (Signed)
CSN: [id number]     Arrival date & time 12/29/15  0546 History   None    Chief Complaint  Patient presents with  . Shortness of Breath     (Consider location/radiation/quality/duration/timing/severity/associated sxs/prior Treatment) HPI Comments: Patient is a 65 year old female with history of coronary artery disease with stent, congestive heart failure. She presents for evaluation of shortness of breath and tightness in the chest that started 2 hours prior to arrival. This woke her from sleep this morning. She denies any fevers or chills. She denies any increase in the swelling in her ankles.  She is followed by Dr. Radford Pax from cardiology.  Patient is a 65 y.o. female presenting with shortness of breath. The history is provided by the patient.  Shortness of Breath Severity:  Moderate Onset quality:  Sudden Duration:  2 hours Timing:  Constant Progression:  Unchanged Chronicity:  New Relieved by:  Nothing Worsened by:  Nothing tried Ineffective treatments:  None tried Associated symptoms: chest pain   Associated symptoms: no fever     Past Medical History  Diagnosis Date  . Hyperlipidemia   . Arthritis   . Anemia   . Chronic combined systolic and diastolic CHF (congestive heart failure) ([place])     a. EF 55% by cath 2010 but in 09/2015 found to be 20%, with mod-severe MR, grade 2 DD.  . Bipolar affective disorder ([place])   . GERD (gastroesophageal reflux disease)   . Obesity   . Peripheral neuropathy ([place])   . 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 CAD (coronary artery disease)     a. CABG in 2002. b. Cath 09/2015: poor targets, not a good surgical candidate, high risk PCI reserved for recurrent symptoms.  . Carotid artery stenosis     60-79% bilateral ICA stenosis s/p Right CEA 2013  followed by Dr. Donnetta Hutching  . Mitral regurgitation     a. Mod-severe by echo 09/2015.  . Ischemic cardiomyopathy   . CKD (chronic kidney disease), stage III    Past Surgical History  Procedure Laterality Date  . Abdominal hysterectomy  1984  . Sp pta renal / visc  artery  2003    with stent of left renal artery  . Tonsillectomy      AS A CHILD  . Endarterectomy  02/18/2012    rocedure: ENDARTERECTOMY CAROTID;  Surgeon: Rosetta Posner, MD;  Location: [facility] OR;  Service: Vascular;  Laterality: Right;  Right Carotid endarterectomy with Dacron patch angioplasty with resection of internal carotid artery  . Coronary artery bypass graft      3 vessel coronary disease S?P CABG with widely patent grafts by cath in 2005 and distal disease past the graft insertion sites.  . Cardiac catheterization  03/25/09    Occluded SVG to intermediate and SVG to OM/posterior descending artery with PTCA left circ 6/10 s/p PTCA stent in SVG to OM/PDA, 07/2002 s/p PCI of left circ 03/2009  . Cardiac catheterization N/A 10/05/2015    Procedure: Left Heart Cath and Cors/Grafts Angiography;  Surgeon: Troy Sine, MD;  Location: [place] [facility] LAB;  Service: Cardiovascular;  Laterality: N/A;   Family History  Problem Relation Age of Onset  . Heart disease Brother     Heart Disease before age 30  . Glaucoma Father   . Hypertension Father   . Stroke Paternal  Grandmother   . Coronary artery disease Paternal Grandmother   . Cerebral aneurysm Brother   . Clotting disorder Sister    Social History  Substance Use Topics  . Smoking status: Never Smoker   . Smokeless tobacco: Never Used  . Alcohol Use: No   OB History    No data available     Review of Systems  Constitutional: Negative for fever.  Respiratory: Positive for shortness of breath.   Cardiovascular: Positive for chest pain.  All other systems reviewed and are negative.     Allergies  Penicillins; Protamine; and Amlodipine  Home Medications   Prior to Admission medications   Medication Sig Start  Date End Date Taking? Authorizing Provider  acetaminophen (TYLENOL) 325 MG tablet Take 2 tablets (650 mg total) by mouth every 4 (four) hours as needed for headache or mild pain. 10/10/15   Luke K Kilroy, PA-C  ALDACTONE 25 MG tablet Take 0.5 tablets (12.5 mg total) by mouth daily. 12/15/15   Sueanne Margarita, MD  aspirin EC 81 MG tablet Take 81 mg by mouth daily.    Historical Provider, MD  BAYER MICROLET LANCETS lancets Use as instructed 08/19/15   Lucretia Kern, DO  carvedilol (COREG) 6.25 MG tablet Take 3 tablets (18.75 mg total) by mouth 2 (two) times daily with a meal. 10/10/15   Luke K Kilroy, PA-C  CRESTOR 40 MG tablet TAKE 1 TABLET BY MOUTH EVERY DAY 09/14/15   Sueanne Margarita, MD  ferrous sulfate 325 (65 FE) MG tablet Take 325 mg by mouth daily with breakfast.    Historical Provider, MD  furosemide (LASIX) 80 MG tablet Take 1 tablet (80 mg total) by mouth every morning. 10/10/15   Erlene Quan, PA-C  Glucose Blood (BAYER BREEZE 2 TEST) DISK Test blood sugar four times a day 08/19/15   Lucretia Kern, DO  Multiple Vitamins-Minerals (MULTIVITAL) tablet Take 1 tablet by mouth daily.     Historical Provider, MD  nitroGLYCERIN (NITROSTAT) 0.4 MG SL tablet Place 1 tablet (0.4 mg total) under the tongue every 5 (five) minutes as needed for chest pain. 10/10/15   Erlene Quan, PA-C  olopatadine (PATANOL) 0.1 % ophthalmic solution Place 1 drop into both eyes 2 (two) times daily. 08/25/15   Lucretia Kern, DO  PLAVIX 75 MG tablet TAKE 1 TABLET BY MOUTH EVERY DAY 12/19/15   Sueanne Margarita, MD  PREVACID 15 MG capsule TAKE ONE CAPSULE EVERY DAY 07/22/15   Lucretia Kern, DO  ZESTRIL 5 MG tablet Take 0.5 tablets (2.5 mg total) by mouth daily. 12/19/15   Sueanne Margarita, MD  ZETIA 10 MG tablet TAKE 1 TABLET BY MOUTH EVERY DAY 11/15/15   Sueanne Margarita, MD   BP 146/68 mmHg  Pulse 84  Temp(Src) 97.4 F (36.3 C) (Oral)  Resp 28  SpO2 90% Physical Exam  Constitutional: She is oriented to person, place, and time.  She appears well-developed and well-nourished. No distress.  HENT:  Head: Normocephalic and atraumatic.  Neck: Normal range of motion. Neck supple.  Cardiovascular: Normal rate and regular rhythm.  Exam reveals no gallop and no friction rub.   No murmur heard. Pulmonary/Chest: Effort normal. No respiratory distress. She has no wheezes. She has rales.  There are rales in the bases bilaterally.  Abdominal: Soft. Bowel sounds are normal. She exhibits no distension. There is no tenderness.  Musculoskeletal: Normal range of motion. She exhibits edema.  There is 1+ pitting  edema bilaterally.  Neurological: She is alert and oriented to person, place, and time.  Skin: Skin is warm and dry. She is not diaphoretic.  Nursing note and vitals reviewed.   ED Course  Procedures (including critical care time) Labs Review Labs Reviewed - No data to display  Imaging Review No results found. I have personally reviewed and evaluated these images and lab results as part of my medical decision-making.  ED ECG REPORT   Date: 12/29/2015  Rate: 85  Rhythm: normal sinus rhythm  QRS Axis: normal  Intervals: normal  ST/T Wave abnormalities: nonspecific T wave changes  Conduction Disutrbances:none  Narrative Interpretation:   Old EKG Reviewed: unchanged  I have personally reviewed the EKG tracing and agree with the computerized printout as noted.   MDM   Final diagnoses:  None    Patient is a 65 year old female with history of congestive heart failure and coronary artery disease with stents. She will this morning at about 3 AM with complaints of tightness in her chest, weakness, and shortness of breath. She arrived here hypoxic with saturations in the upper 80s. Her workup reveals an elevated BNP and findings on her chest x-ray consistent with mild pulmonary edema. She was given IV Lasix. Her EKG shows no changes and troponin is negative at this point. I've spoken with cardiology who will evaluate  the patient and determine the final disposition.    Veryl Speak, MD 12/29/15 [phone]

## 2015-12-29 NOTE — ED Notes (Signed)
Pt reports improvement of breathing with oxygen

## 2015-12-29 NOTE — ED Notes (Signed)
Breakfast tray ordered for pt

## 2015-12-29 NOTE — ED Notes (Signed)
Lunch tray ordered for pt.

## 2015-12-29 NOTE — ED Notes (Signed)
Pt to ED c/o sudden onset of shortness of breath and chest tightness; hx of CHF; crackles in lower lobe

## 2015-12-29 NOTE — Research (Signed)
[email or web address]  Informed Consent   Subject Name: Penny James  Subject met inclusion and exclusion criteria.  The informed consent form, study requirements and expectations were reviewed with the subject and questions and concerns were addressed prior to the signing of the consent form.  The subject verbalized understanding of the trail requirements.  The subject agreed to participate in the [email or web address]  trial and signed the informed consent.  The informed consent was obtained prior to performance of any protocol-specific procedures for the subject.  A copy of the signed informed consent was given to the subject and a copy was placed in the subject's medical record.  Sandie Ano 12/29/2015, 16:19

## 2015-12-29 NOTE — ED Notes (Signed)
Sitting in recliner.

## 2015-12-29 NOTE — ED Notes (Signed)
Report called to [facility] -- Janett Billow, RN

## 2015-12-30 ENCOUNTER — Telehealth: Payer: Self-pay | Admitting: Cardiology

## 2015-12-30 ENCOUNTER — Ambulatory Visit (HOSPITAL_COMMUNITY): Payer: Medicare Other

## 2015-12-30 ENCOUNTER — Other Ambulatory Visit: Payer: Self-pay | Admitting: Physician Assistant

## 2015-12-30 DIAGNOSIS — I5043 Acute on chronic combined systolic (congestive) and diastolic (congestive) heart failure: Secondary | ICD-10-CM | POA: Diagnosis not present

## 2015-12-30 DIAGNOSIS — I255 Ischemic cardiomyopathy: Secondary | ICD-10-CM | POA: Diagnosis not present

## 2015-12-30 DIAGNOSIS — I25708 Atherosclerosis of coronary artery bypass graft(s), unspecified, with other forms of angina pectoris: Secondary | ICD-10-CM | POA: Diagnosis not present

## 2015-12-30 LAB — BASIC METABOLIC PANEL
Anion gap: 11 (ref 5–15)
BUN: 21 mg/dL — ABNORMAL HIGH (ref 6–20)
CALCIUM: 9.5 mg/dL (ref 8.9–10.3)
CHLORIDE: 99 mmol/L — AB (ref 101–111)
CO2: 27 mmol/L (ref 22–32)
CREATININE: 1.24 mg/dL — AB (ref 0.44–1.00)
GFR calc Af Amer: 51 mL/min — ABNORMAL LOW (ref >60)
GFR calc non Af Amer: 44 mL/min — ABNORMAL LOW (ref >60)
GLUCOSE: 83 mg/dL (ref 65–99)
Potassium: 3.7 mmol/L (ref 3.5–5.1)
Sodium: 137 mmol/L (ref 135–145)

## 2015-12-30 LAB — BRAIN NATRIURETIC PEPTIDE: B Natriuretic Peptide: 1975.5 pg/mL — ABNORMAL HIGH (ref 0.0–100.0)

## 2015-12-30 MED ORDER — FUROSEMIDE 80 MG PO TABS
80.0000 mg | ORAL_TABLET | Freq: Every morning | ORAL | Status: DC
  Administered 2015-12-30: 80 mg via ORAL
  Filled 2015-12-30: qty 1

## 2015-12-30 MED ORDER — ZESTRIL 5 MG PO TABS: 5.0000 mg | ORAL_TABLET | Freq: Every day | ORAL | Status: DC

## 2015-12-30 MED ORDER — ISOSORBIDE MONONITRATE ER 30 MG PO TB24: 30.0000 mg | ORAL_TABLET | Freq: Every day | ORAL | Status: DC

## 2015-12-30 MED ORDER — ISOSORBIDE MONONITRATE ER 30 MG PO TB24
30.0000 mg | ORAL_TABLET | Freq: Every day | ORAL | Status: DC
  Administered 2015-12-30: 30 mg via ORAL
  Filled 2015-12-30: qty 1

## 2015-12-30 NOTE — Research (Signed)
ReDS Vest Discharge Study  Your patient is in the Blinded arm of the Vest at Discharge study.  Your patient has had a ReDS Vest reading and the reading has been transmitted to the cloud.  Your patient is ok for discharge.    Thank You   The research team   

## 2015-12-30 NOTE — Progress Notes (Signed)
Heart Failure Navigator Consult Note  Presentation: Penny James presented with is an 65 y.o. female with history of chronic combined CHF with recent LV dysfunction diagnosis, CAD s/p CABG 2002, mod-severe MR, HTN s/p renal artery stenting, HLD, bipolar affective disorder, DM, carotid artery disease (R CEA 2013, followed by [facility]), CKD stage III, anemia. She was admitted in Dec 2016 with SOB, elevated troponin, and worsening CHF. 2D Echo revealed new LV dysfunction wtih EF 20-25%, diffuse HK with inferolateral akinesis, grade 2 DD, mod-severe MR, mod dilated LA, mild pulm HTN. Per notes Penny James on 12/7 showed "all saphenous vein grafts are occluded and that the LIMA to LAD is widely patent but LAD is diffusely disease both antegrade and retrograde from the LIMA anastomosis. Native right coronary is totally occluded. Heavily calcified left main 75% stenosis and 90% ostial LAD. The circumflex is large and diffusely diseased distally." She also had moderate-severe mitral regurgitation directed posteriorly from a tethered posterior leaflet. She was not felt to be a surgical candidate (severe protamine reaction, poor targets) but high risk PCI with an assist device (Impella) was felt to be a possible option. The risks and benefits of the procedure were discussed with the pt who elected trial of medical therapy given improvement in symptoms. CHF meds were titrated. D/c Cr 1.24, Hgb 9.2. She has had several issues taking meds because she won't take generic.  She presents today with SOB and diaphoresis which woke her up at 0230hrs. +orthopnea/PND, LEE. She normally sleeps in a recliner, which she says is mostly for comfort. She said she ate [place] food yesterday which is the reason she is having heart failure. She says overall she feels like she's been doing well since her last appointment in December. She's taking all her medications as prescribed. Since getting IV Lasix she feels much better.  Her weight in December was 152 pounds She denies ever having any chest pain or pressure. She also denies nausea, vomiting, fever, chest pain, shortness of breath, orthopnea, dizziness, PND, cough, congestion, abdominal pain, hematochezia, melena, lower extremity edema, claudication  Past Medical History  Diagnosis Date  . Hyperlipidemia   . Arthritis   . Anemia   . Chronic combined systolic and diastolic CHF (congestive heart failure) ([place])     a. EF 55% by cath 2010 but in 09/2015 found to be 20%, with mod-severe MR, grade 2 DD.  . Bipolar affective disorder ([place])   . GERD (gastroesophageal reflux disease)   . Obesity   . Peripheral neuropathy ([place])   . 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 CAD (coronary artery disease)     a. CABG in 2002. b. Cath 09/2015: poor targets, not a good surgical candidate, high risk PCI reserved for recurrent symptoms.  . Carotid artery stenosis     60-79% bilateral ICA stenosis s/p Right CEA 2013  followed by Dr. Donnetta James  . Mitral regurgitation     a. Mod-severe by echo 09/2015.  . Ischemic cardiomyopathy   . CKD (chronic kidney disease), stage III     Social History   Social History  . Marital Status: Married    Spouse Name: N/A  . Number of Children: 3  . Years of Education: N/A   Occupational History  . DISABLED    Social History Main Topics  . Smoking status: Never Smoker   .  Smokeless tobacco: Never Used  . Alcohol Use: No  . Drug Use: No  . Sexual Activity: No   Other Topics Concern  . None   Social History Narrative    ECHO:Study Conclusions 10/03/15  - Left ventricle: The cavity size was normal. Wall thickness was normal. Systolic function was severely reduced. The estimated ejection fraction was in the range of 20% to 25%. Diffuse hypokinesis with inferolateral akinesis.  E/medial e&' > 15 suggesting LV end diastolic pressurea at least 20 mmHg. Features are consistent with a pseudonormal left ventricular filling pattern, with concomitant abnormal relaxation and increased filling pressure (grade 2 diastolic dysfunction). - Aortic valve: There was no stenosis. There was trivial regurgitation. - Mitral valve: Mildly calcified annulus. Mildly calcified leaflets . There was moderate to severe regurgitation. The posterior leaflet was restricted. Valve area by pressure half-time: 4.4 cm^2. - Left atrium: The atrium was moderately dilated. - Right ventricle: The cavity size was normal. Systolic function was normal. - Tricuspid valve: Peak RV-RA gradient (S): 33 mm Hg. - Pulmonary arteries: PA peak pressure: 48 mm Hg (S). - Systemic veins: IVC measured 2.3 cm with < 50% respirophasic variation, suggesting RA pressure 15 mmHg.  Impressions:  - Normal LV size with EF 20-25%. Diffuse hypokinesis with inferolateral akinesis. Moderate diastolic dysfunction with evidence for elevated LV filling pressure. Normal RV size and systolic function. Moderate to severe MR with restricted posterior leaflet. Mild pulmonary hypertension.  Transthoracic echocardiography. M-mode, complete 2D, spectral Doppler, and color Doppler. Birthdate: Patient birthdate: Jan 22, 1951. Age: Patient is 65 yr old. Sex: Gender: female. BMI: 27.6 kg/m^2. Blood pressure:   139/45 Patient status: Inpatient. Study date: Study date: 10/03/2015. Study time: 10:45 AM. Location: ICU/CCU  BNP    Component Value Date/Time   BNP 1975.5* 12/30/2015 0345    ProBNP    Component Value Date/Time   PROBNP 460.0* 04/01/2015 0738     Education Assessment and Provision:  Detailed education and instructions provided on heart failure disease management including the following:  Signs and symptoms of Heart Failure When to call the physician Importance of  daily weights Low sodium diet Fluid restriction Medication management Anticipated future follow-up appointments  Patient education given on each of the above topics.  Patient acknowledges understanding and acceptance of all instructions.  I spoke with Penny James regarding her HF admission.  She admits that she ate [place] food prior and says that is why she is here.  She does have a scale and weighs everyday.  We reviewed the importance of daily weights and how the weights relate to the signs and symptoms of HF.  I spent a large portion of our conversation reviewing a low sodium diet and high sodium foods to avoid.  I encouraged her to aim for 2000 mg of Sodium per day and to read labels.  She was very appreciative and said she had never heard some of the information before.  She denies any issues getting or taking prescribed medications.  She follows with [facility].  Education Materials:  "Living Better With Heart Failure" Booklet, Daily Weight Tracker Tool    High Risk Criteria for Readmission and/or Poor Patient Outcomes:   EF <30%- Yes 20-25%  2 or more admissions in 6 months- yes 3/55mo  Difficult social situation- No denies  Demonstrates medication noncompliance- denies    Barriers of Care:  Knowledge and compliance  Discharge Planning:   Plans to return to home with husband and granddaughters in [place].  She could benefit  from [facility] for ongoing HF education, compliance reinforcement and symptom recognition.

## 2015-12-30 NOTE — Discharge Summary (Signed)
Discharge Summary    Patient ID: Penny James,  MRN: [id number], DOB/AGE: 1951-02-04 65 y.o.  Admit date: 12/29/2015 Discharge date: 12/30/2015  Primary Care Provider: Lucretia Kern Primary Cardiologist: Dr Radford Pax  Discharge Diagnoses    Principal Problem:   Acute on chronic combined systolic and diastolic heart failure, NYHA class 3 ([place]) Active Problems:   Benign essential HTN   Acute kidney injury superimposed on CKD ([place])   Coronary artery disease involving coronary bypass graft   Cardiomyopathy, ischemic-new 12/16   Acute on chronic combined systolic and diastolic heart failure (HCC)   Allergies Allergies  Allergen Reactions  . Penicillins Rash    Has patient had a PCN reaction causing immediate rash, facial/tongue/throat swelling, SOB or lightheadedness with hypotension: Yes Has patient had a PCN reaction causing severe rash involving mucus membranes or skin necrosis: Yes Has patient had a PCN reaction that required hospitalization Yes- admitted to hospital Has patient had a PCN reaction occurring within the last 10 years: No, childhood allergy If all of the above answers are "NO", then may proceed with Cephalosporin use.   . Protamine     Severe hypotension Internal bleeding around heart  . Amlodipine Swelling    Causes swelling in higher dosage    Diagnostic Studies/Procedures    CXR _____________   History of Present Illness     65 yo female w/ hx S-D-CHF, EF 20-25% by echo 09/2015, CABG 2002, HTN, RAS w/ stent, DM, R-CEA, CKD III, med rx for CAD w/ LIMA-LAD only patent graft at cath 09/2015. Admitted 03/02 w/ CHF and has had occasional CP, pt admits to dietary indiscretion with [facility]     Consultants: none   Pt diuresed with Lasix 40 mg x 2 doses. Her respiratory status improved. I/O are inaccurate, but she lost 7 lbs over her hospital stay.  Her enzymes were negative for MI. She had no more chest pain. Dr Claiborne Billings  reviewed the data and felt she had several myocardial vascular distributions at risk for ischemia and is possible some of her symptomatology may also be caused by ischemia. She had Imdur 30 mg qd added to her medication regimen.  On 03/03, she was seen by Dr Claiborne Billings and was considered much improved. No further inpatient workup was indicated and she is considered stable for discharge, to follow up as an outpatient.  _____________  Discharge Vitals Blood pressure 129/60, pulse 72, temperature 98.8 F (37.1 C), temperature source Oral, resp. rate 18, height 5\' 3"  (1.6 m), weight 147 lb 4.8 oz (66.815 kg), SpO2 100 %.  Filed Weights   12/29/15 0619 12/29/15 1539 12/30/15 0616  Weight: 154 lb 3.2 oz (69.945 kg) 148 lb 8 oz (67.359 kg) 147 lb 4.8 oz (66.815 kg)    Labs & Radiologic Studies     CBC  Recent Labs  12/29/15 0607 12/29/15 1626  WBC 5.1 3.6*  NEUTROABS 3.5  --   HGB 11.0* 11.2*  HCT 34.1* 34.4*  MCV 82.4 81.1  PLT 160 [id number]   Basic Metabolic Panel  Recent Labs  12/29/15 0607 12/29/15 1626 12/30/15 0345  NA 135  --  137  K 4.2  --  3.7  CL 100*  --  99*  CO2 22  --  27  GLUCOSE 216*  --  83  BUN 23*  --  21*  CREATININE 1.21* 1.17* 1.24*  CALCIUM 9.5  --  9.5   Liver Function  Tests  Recent Labs  12/29/15 0607  AST 33  ALT 28  ALKPHOS 58  BILITOT 1.0  PROT 6.5  ALBUMIN 4.1   Dg Chest 2 View  12/29/2015  CLINICAL DATA:  Acute onset of shortness of breath. Initial encounter. EXAM: CHEST  2 VIEW COMPARISON:  Chest radiograph performed 10/03/2015 FINDINGS: The lungs are well-aerated. Vascular congestion is noted. Small bilateral pleural effusions are seen. Increased interstitial markings raise concern for mild pulmonary edema. No pneumothorax is seen. The heart is mildly enlarged. The patient is status post median sternotomy. No acute osseous abnormalities are seen. IMPRESSION: Vascular congestion and mild cardiomegaly. Small bilateral pleural effusions noted.  Increased interstitial markings raise concern for mild pulmonary edema. Electronically Signed   By: Garald Balding M.D.   On: 12/29/2015 07:05    Disposition   Pt is being discharged home today in good condition.  Follow-up Plans & Appointments    Follow-up Information    Follow up with Colin Benton R., DO. Go on 01/02/2016.   Specialty:  Family Medicine   Why:  @1 ;00pm   Contact information:   [place] [address] [phone]       Follow up with Sueanne Margarita, MD.   Specialty:  Cardiology   Why:  The office will call.   Contact information:   [id number] [place] [address] [phone]      Discharge Instructions    (HEART FAILURE PATIENTS) Call MD:  Anytime you have any of the following symptoms: 1) 3 pound weight gain in 24 hours or 5 pounds in 1 week 2) shortness of breath, with or without a dry hacking cough 3) swelling in the hands, feet or stomach 4) if you have to sleep on extra pillows at night in order to breathe.    Complete by:  As directed      Diet - low sodium heart healthy    Complete by:  As directed      Increase activity slowly    Complete by:  As directed            Discharge Medications   Current Discharge Medication List    START taking these medications   Details  isosorbide mononitrate (IMDUR) 30 MG 24 hr tablet Take 1 tablet (30 mg total) by mouth daily. OK to use name brand Imdur if pt prefers. Qty: 30 tablet, Refills: 11      CONTINUE these medications which have CHANGED   Details  ZESTRIL 5 MG tablet Take 1 tablet (5 mg total) by mouth daily. Qty: 30 tablet, Refills: 11      CONTINUE these medications which have NOT CHANGED   Details  acetaminophen (TYLENOL) 325 MG tablet Take 2 tablets (650 mg total) by mouth every 4 (four) hours as needed for headache or mild pain.    ALDACTONE 25 MG tablet Take 0.5 tablets (12.5 mg total) by mouth daily. Qty: 15 tablet, Refills: 11    aspirin EC 81  MG tablet Take 81 mg by mouth daily.    BAYER MICROLET LANCETS lancets Use as instructed Qty: 100 each, Refills: 3    carvedilol (COREG) 6.25 MG tablet Take 3 tablets (18.75 mg total) by mouth 2 (two) times daily with a meal. Qty: 100 tablet, Refills: 11    CRESTOR 40 MG tablet TAKE 1 TABLET BY MOUTH EVERY DAY Qty: 30 tablet, Refills: 11    ferrous sulfate 325 (65 FE) MG tablet Take 325  mg by mouth daily with breakfast.    furosemide (LASIX) 80 MG tablet Take 1 tablet (80 mg total) by mouth every morning. Qty: 30 tablet, Refills: 11    Glucose Blood (BAYER BREEZE 2 TEST) DISK Test blood sugar four times a day Qty: 200 each, Refills: 5    Multiple Vitamins-Minerals (MULTIVITAL) tablet Take 1 tablet by mouth daily.     nitroGLYCERIN (NITROSTAT) 0.4 MG SL tablet Place 1 tablet (0.4 mg total) under the tongue every 5 (five) minutes as needed for chest pain. Qty: 25 tablet, Refills: 2    olopatadine (PATANOL) 0.1 % ophthalmic solution Place 1 drop into both eyes 2 (two) times daily. Qty: 5 mL, Refills: 1    PLAVIX 75 MG tablet TAKE 1 TABLET BY MOUTH EVERY DAY Qty: 90 tablet, Refills: 0    PREVACID 15 MG capsule TAKE ONE CAPSULE EVERY DAY Qty: 90 capsule, Refills: 0    ZETIA 10 MG tablet TAKE 1 TABLET BY MOUTH EVERY DAY Qty: 90 tablet, Refills: 0         Outstanding Labs/Studies   None  Duration of Discharge Encounter   Greater than 30 minutes including physician time.  Jonetta Speak NP 12/30/2015, 2:16 PM

## 2015-12-30 NOTE — Telephone Encounter (Signed)
**Note De-identified Laurence Folz Obfuscation** LMTCB

## 2015-12-30 NOTE — Progress Notes (Signed)
Patient Name: Penny James Date of Encounter: 12/30/2015  Principal Problem:   Acute on chronic combined systolic and diastolic heart failure, NYHA class 3 ([place]) Active Problems:   Benign essential HTN   Acute kidney injury superimposed on CKD ([place])   Coronary artery disease involving coronary bypass graft   Cardiomyopathy, ischemic-new 12/16   Acute on chronic combined systolic and diastolic heart failure [facility])   Primary Cardiologist: Dr Radford Pax  Patient Profile: 65 yo female w/ hx S-D-CHF, EF 20-25% by echo 09/2015, CABG 2002, HTN, RAS w/ stent, DM, R-CEA, CKD III, med rx for CAD w/ LIMA-LAD only patent graft at cath 09/2015. Admitted 03/02 w/ CHF.   SUBJECTIVE: Breathing much better, has been ambulating without difficulty. Willing to be more vigilant about dietary sodium, especially when eating out.   OBJECTIVE Filed Vitals:   12/29/15 1957 12/30/15 0030 12/30/15 0616 12/30/15 0747  BP: 115/53 118/46 119/57 129/60  Pulse: 72 67 73 72  Temp: 97.9 F (36.6 C) 98.4 F (36.9 C) 97.8 F (36.6 C) 98.8 F (37.1 C)  TempSrc: Oral Oral Oral Oral  Resp: 18 18 16 18   Height:      Weight:   147 lb 4.8 oz (66.815 kg)   SpO2: 95% 100% 100% 100%    Intake/Output Summary (Last 24 hours) at 12/30/15 1037 Last data filed at 12/30/15 0847  Gross per 24 hour  Intake    800 ml  Output    775 ml  Net     25 ml   Filed Weights   12/29/15 0619 12/29/15 1539 12/30/15 0616  Weight: 154 lb 3.2 oz (69.945 kg) 148 lb 8 oz (67.359 kg) 147 lb 4.8 oz (66.815 kg)    PHYSICAL EXAM General: Well developed, well nourished, female in no acute distress. Head: Normocephalic, atraumatic.  Neck: Supple without bruits, JVD not elevated. Lungs:  Resp regular and unlabored, CTA. Heart: RRR, S1, S2, no S3, S4, 2/6 murmur; no rub. Abdomen: Soft, non-tender, non-distended, BS + x 4.  Extremities: No clubbing, cyanosis, edema.  Neuro: Alert and oriented X 3. Moves all extremities  spontaneously. Psych: Normal affect.  LABS: CBC:  Recent Labs  12/29/15 0607 12/29/15 1626  WBC 5.1 3.6*  NEUTROABS 3.5  --   HGB 11.0* 11.2*  HCT 34.1* 34.4*  MCV 82.4 81.1  PLT 160 154   INR:  Recent Labs  12/29/15 0607  INR [id number]   Basic Metabolic Panel:  Recent Labs  12/29/15 0607 12/29/15 1626 12/30/15 0345  NA 135  --  137  K 4.2  --  3.7  CL 100*  --  99*  CO2 22  --  27  GLUCOSE 216*  --  83  BUN 23*  --  21*  CREATININE 1.21* 1.17* 1.24*  CALCIUM 9.5  --  9.5   Liver Function Tests:  Recent Labs  12/29/15 0607  AST 33  ALT 28  ALKPHOS 58  BILITOT 1.0  PROT 6.5  ALBUMIN 4.1    Recent Labs  12/29/15 0643  TROPIPOC 0.02   BNP:  B NATRIURETIC PEPTIDE  Date/Time Value Ref Range Status  12/30/2015 03:45 AM 1975.5* 0.0 - 100.0 pg/mL Final  12/29/2015 06:07 AM 1860.2* 0.0 - 100.0 pg/mL Final   TELE: SR, occ PVCs       Radiology/Studies: Dg Chest 2 View  12/29/2015  CLINICAL DATA:  Acute onset of shortness of breath. Initial encounter. EXAM: CHEST  2 VIEW COMPARISON:  Chest  radiograph performed 10/03/2015 FINDINGS: The lungs are well-aerated. Vascular congestion is noted. Small bilateral pleural effusions are seen. Increased interstitial markings raise concern for mild pulmonary edema. No pneumothorax is seen. The heart is mildly enlarged. The patient is status post median sternotomy. No acute osseous abnormalities are seen. IMPRESSION: Vascular congestion and mild cardiomegaly. Small bilateral pleural effusions noted. Increased interstitial markings raise concern for mild pulmonary edema. Electronically Signed   By: Garald Balding M.D.   On: 12/29/2015 07:05     Current Medications:  . aspirin EC  81 mg Oral Daily  . carvedilol  18.75 mg Oral BID WC  . clopidogrel  75 mg Oral Daily  . ezetimibe  10 mg Oral Daily  . ferrous sulfate  325 mg Oral Q breakfast  . heparin  5,000 Units Subcutaneous 3 times per day  . lisinopril  5 mg Oral Daily    . pantoprazole  40 mg Oral Daily  . rosuvastatin  40 mg Oral q1800  . sodium chloride flush  3 mL Intravenous Q12H  . spironolactone  12.5 mg Oral Daily      ASSESSMENT AND PLAN: Principal Problem:   Acute on chronic combined systolic and diastolic heart failure, NYHA class 3 (HCC) - rec'd IV Lasix x 2 doses w/ improvement - will restart her home PO Lasix   Active Problems:   Benign essential HTN   Acute kidney injury superimposed on CKD ([facility]) - no sig change in BUN/Cr w/ diuresis    Coronary artery disease involving coronary bypass graft - no ischemic sx, ez neg MI    Cardiomyopathy, ischemic-new 12/16 - on BB/ACE/spiro    Acute on chronic combined systolic and diastolic heart failure ([name]) - see above.  Plan: pt much improved, MD advise on D/C today.   Augusto Garbe 10:37 AM 12/30/2015   Patient seen and examined. Agree with assessment and plan. Feels well; breathing much better. I/O -225 yesterday. Wt 154 -->147 with different scales.  Pt admits to occasional episodes of chest pain. As noted yesterday will initiate isosorbide since she has several myocardial vascular distributions at risk for ischemia and is possible some of her symptomatology may also be contributed by ischemia without chest pain. Resume oral lasix.  Probably dc later today with f/u Dr. Radford Pax.   Troy Sine, MD, [facility] 12/30/2015 11:27 AM

## 2015-12-30 NOTE — Progress Notes (Signed)
Pt has orders to be discharged. Discharge instructions given and pt has no additional questions at this time. Medication regimen reviewed and pt educated. Pt verbalized understanding and has no additional questions. Telemetry box removed. IV removed and site in good condition. Pt stable and waiting for transportation.   Aubriee Szeto RN 

## 2015-12-30 NOTE — Telephone Encounter (Signed)
New message      Pt is being discharged from hosp today.  She has a question about her medications.  Please call this afternoon

## 2015-12-30 NOTE — Telephone Encounter (Signed)
TCM per [facility]  3/10 @ 1210 pm w/ Nicki Reaper

## 2016-01-02 ENCOUNTER — Ambulatory Visit (INDEPENDENT_AMBULATORY_CARE_PROVIDER_SITE_OTHER): Payer: Medicare Other | Admitting: Family Medicine

## 2016-01-02 ENCOUNTER — Ambulatory Visit (HOSPITAL_COMMUNITY): Payer: Medicare Other

## 2016-01-02 DIAGNOSIS — R69 Illness, unspecified: Secondary | ICD-10-CM

## 2016-01-02 NOTE — Telephone Encounter (Signed)
Patient contacted regarding discharge from [facility] on 12-30-15  Patient understands to follow up with provider [facility] on 01-06-16 at 12:10pm at [address] Patient understands discharge instructions? yes Patient understands medications and regiment? yes Patient understands to bring all medications to this visit? yes

## 2016-01-02 NOTE — Progress Notes (Signed)
Late cancel

## 2016-01-04 ENCOUNTER — Ambulatory Visit (HOSPITAL_COMMUNITY): Payer: Medicare Other

## 2016-01-04 ENCOUNTER — Ambulatory Visit (INDEPENDENT_AMBULATORY_CARE_PROVIDER_SITE_OTHER): Payer: Medicare Other | Admitting: Family Medicine

## 2016-01-04 ENCOUNTER — Encounter: Payer: Self-pay | Admitting: Family Medicine

## 2016-01-04 ENCOUNTER — Other Ambulatory Visit: Payer: Self-pay | Admitting: Radiology

## 2016-01-04 VITALS — BP 120/72 | HR 75 | Temp 97.9°F | Ht 63.0 in | Wt 148.2 lb

## 2016-01-04 DIAGNOSIS — E782 Mixed hyperlipidemia: Secondary | ICD-10-CM

## 2016-01-04 DIAGNOSIS — E669 Obesity, unspecified: Secondary | ICD-10-CM | POA: Diagnosis not present

## 2016-01-04 DIAGNOSIS — N63 Unspecified lump in unspecified breast: Secondary | ICD-10-CM

## 2016-01-04 DIAGNOSIS — I5043 Acute on chronic combined systolic (congestive) and diastolic (congestive) heart failure: Secondary | ICD-10-CM

## 2016-01-04 DIAGNOSIS — E1151 Type 2 diabetes mellitus with diabetic peripheral angiopathy without gangrene: Secondary | ICD-10-CM

## 2016-01-04 DIAGNOSIS — I10 Essential (primary) hypertension: Secondary | ICD-10-CM

## 2016-01-04 NOTE — Progress Notes (Signed)
HPI:  Penny James is a very pleasant 65 year old, unfortunately with a very complicated past medical history or compliance with care recommendations in the past. She has significant heart disease and hyperlipidemia and hypertension ,  managed by her cardiologist, diet controlled diabetes, Chronic anemia, chronic kidney disease and is currently undergoing evaluation for a breast mass. She was hospitalized from 12/29/2015-12/30/2015 for acute on chronic combined heart failure. Per review of discharge documents, she responded well to in-house diuresis. She reports she is feeling much better since her discharge with no shortness of breath or chest pain. She reports her weights are being monitored by a nurse and have actually decreased. She is in a hurry today, as she is leaving her to go have a biopsy of her breast mass. She is in good spirits despite his diagnosis. Her diabetes is well controlled on last labs. Her kidney function and anemia are stable. She was referred to hematologist for her anemia, but has not yet gone for the visit. She reports she continues to monitor her diet and get regular activity, but feels that eating [place] food was with her in the hospital. She is now on isorbide and has anxiety about taking a generic medication, but reports she has tolerated this well.  she reports she has follow-up with her cardiologist in 2 days.   ROS: See pertinent positives and negatives per HPI.  Past Medical History  Diagnosis Date  . Hyperlipidemia   . Arthritis   . Anemia   . Chronic combined systolic and diastolic CHF (congestive heart failure) ([place])     a. EF 55% by cath 2010 but in 09/2015 found to be 20%, with mod-severe MR, grade 2 DD.  . Bipolar affective disorder ([place])   . GERD (gastroesophageal reflux disease)   . Obesity   . Peripheral neuropathy ([place])   . 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 Diabetes mellitus     retinopaty, neuropathy ischemic  cardiomyopathy with inferior hypokinesis EF normalized in 2005  . CAD (coronary artery disease)     a. CABG in 2002. b. Cath 09/2015: poor targets, not a good surgical candidate, high risk PCI reserved for recurrent symptoms.  . Carotid artery stenosis     60-79% bilateral ICA stenosis s/p Right CEA 2013  followed by Dr. Donnetta Hutching  . Mitral regurgitation     a. Mod-severe by echo 09/2015.  . Ischemic cardiomyopathy   . CKD (chronic kidney disease), stage III     Past Surgical History  Procedure Laterality Date  . Abdominal hysterectomy  1984  . Sp pta renal / visc  artery  2003    with stent of left renal artery  . Tonsillectomy      AS A CHILD  . Endarterectomy  02/18/2012    rocedure: ENDARTERECTOMY CAROTID;  Surgeon: Rosetta Posner, MD;  [facility];  Service: Vascular;  Laterality: Right;  Right Carotid endarterectomy with Dacron patch angioplasty with resection of internal carotid artery  . Coronary artery bypass graft      3 vessel coronary disease S?P CABG with widely patent grafts by cath in 2005 and distal disease past the graft insertion sites.  . Cardiac catheterization  03/25/09    Occluded SVG to intermediate and SVG to OM/posterior descending artery with PTCA left circ 6/10 s/p PTCA stent in SVG to OM/PDA, 07/2002 s/p PCI of left circ 03/2009  . Cardiac catheterization N/A 10/05/2015    Procedure: Left  Heart Cath and Cors/Grafts Angiography;  Surgeon: Troy Sine, MD;  Location: [facility];  Service: Cardiovascular;  Laterality: N/A;    Family History  Problem Relation Age of Onset  . Heart disease Brother     Heart Disease before age 4  . Glaucoma Father   . Hypertension Father   . Stroke Paternal Grandmother   . Coronary artery disease Paternal Grandmother   . Cerebral aneurysm Brother   . Clotting disorder Sister     Social History   Social History  . Marital Status: Married    Spouse Name: N/A  . Number of Children: 3  . Years of Education: N/A    Occupational History  . DISABLED    Social History Main Topics  . Smoking status: Never Smoker   . Smokeless tobacco: Never Used  . Alcohol Use: No  . Drug Use: No  . Sexual Activity: No   Other Topics Concern  . None   Social History Narrative     Current outpatient prescriptions:  .  acetaminophen (TYLENOL) 325 MG tablet, Take 2 tablets (650 mg total) by mouth every 4 (four) hours as needed for headache or mild pain., Disp: , Rfl:  .  ALDACTONE 25 MG tablet, Take 0.5 tablets (12.5 mg total) by mouth daily., Disp: 15 tablet, Rfl: 11 .  aspirin EC 81 MG tablet, Take 81 mg by mouth daily., Disp: , Rfl:  .  BAYER MICROLET LANCETS lancets, Use as instructed, Disp: 100 each, Rfl: 3 .  carvedilol (COREG) 6.25 MG tablet, Take 3 tablets (18.75 mg total) by mouth 2 (two) times daily with a meal., Disp: 100 tablet, Rfl: 11 .  CRESTOR 40 MG tablet, TAKE 1 TABLET BY MOUTH EVERY DAY, Disp: 30 tablet, Rfl: 11 .  ferrous sulfate 325 (65 FE) MG tablet, Take 325 mg by mouth daily with breakfast., Disp: , Rfl:  .  furosemide (LASIX) 80 MG tablet, Take 1 tablet (80 mg total) by mouth every morning., Disp: 30 tablet, Rfl: 11 .  Glucose Blood (BAYER BREEZE 2 TEST) DISK, Test blood sugar four times a day, Disp: 200 each, Rfl: 5 .  isosorbide mononitrate (IMDUR) 30 MG 24 hr tablet, Take 1 tablet (30 mg total) by mouth daily. OK to use name brand Imdur if pt prefers., Disp: 30 tablet, Rfl: 11 .  Multiple Vitamins-Minerals (MULTIVITAL) tablet, Take 1 tablet by mouth daily. , Disp: , Rfl:  .  nitroGLYCERIN (NITROSTAT) 0.4 MG SL tablet, Place 1 tablet (0.4 mg total) under the tongue every 5 (five) minutes as needed for chest pain., Disp: 25 tablet, Rfl: 2 .  olopatadine (PATANOL) 0.1 % ophthalmic solution, Place 1 drop into both eyes 2 (two) times daily. (Patient taking differently: Place 1 drop into both eyes 2 (two) times daily as needed for allergies. ), Disp: 5 mL, Rfl: 1 .  PLAVIX 75 MG tablet, TAKE 1  TABLET BY MOUTH EVERY DAY, Disp: 90 tablet, Rfl: 0 .  PREVACID 15 MG capsule, TAKE ONE CAPSULE EVERY DAY, Disp: 90 capsule, Rfl: 0 .  ZESTRIL 5 MG tablet, Take 1 tablet (5 mg total) by mouth daily., Disp: 30 tablet, Rfl: 11 .  ZETIA 10 MG tablet, TAKE 1 TABLET BY MOUTH EVERY DAY, Disp: 90 tablet, Rfl: 0  EXAM:  Filed Vitals:   01/04/16 0914  BP: 120/72  Pulse: 75  Temp: 97.9 F (36.6 C)    Body mass index is 26.26 kg/(m^2).  GENERAL: vitals reviewed and  listed above, alert, oriented, appears well hydrated and in no acute distress  HEENT: atraumatic, conjunttiva clear, no obvious abnormalities on inspection of external nose and ears  NECK: no obvious masses on inspection  LUNGS: clear to auscultation bilaterally, no wheezes, rales or rhonchi, good air movement  CV: HRRR, SEM, tr peripheral edema  MS: moves all extremities without noticeable abnormality  PSYCH: pleasant and cooperative, no obvious depression or anxiety  ASSESSMENT AND PLAN:  Discussed the following assessment and plan:  Acute on chronic combined systolic and diastolic heart failure (HCC)  DM (diabetes mellitus) type II controlled peripheral vascular disorder (HCC)  Benign essential HTN  Obesity  Mixed hyperlipidemia  Breast mass  -seems to be doing well despite  Circumstances -Will be thinking about her in regards to her biopsy today -Advised she keep her follow-up with her cardiologist later this week -Follow up here in 3 months -Patient advised to return or notify a doctor immediately if symptoms worsen or persist or new concerns arise.  Patient Instructions  BEFORE YOU LEAVE: Schedule follow up in 3 months  Keep appointment with your cardiologist as scheduled.  Go to get biopsy today.  We recommend the following healthy lifestyle measures: - eat a healthy whole foods diet consisting of regular small meals composed of vegetables, fruits, beans, nuts, seeds, healthy meats such as white  chicken and fish and whole grains.  - avoid sweets, white starchy foods, fried foods, fast food, processed foods, sodas, red meet and other fattening foods.  - activity as tolerated - build gradually to goal of 150 -300 minutes per week      Lindie Roberson R.

## 2016-01-04 NOTE — Progress Notes (Signed)
Pre visit review using our clinic review tool, if applicable. No additional management support is needed unless otherwise documented below in the visit note. 

## 2016-01-04 NOTE — Patient Instructions (Signed)
BEFORE YOU LEAVE: Schedule follow up in 3 months  Keep appointment with your cardiologist as scheduled.  Go to get biopsy today.  We recommend the following healthy lifestyle measures: - eat a healthy whole foods diet consisting of regular small meals composed of vegetables, fruits, beans, nuts, seeds, healthy meats such as white chicken and fish and whole grains.  - avoid sweets, white starchy foods, fried foods, fast food, processed foods, sodas, red meet and other fattening foods.  - activity as tolerated - build gradually to goal of 150 -300 minutes per week

## 2016-01-05 ENCOUNTER — Telehealth: Payer: Self-pay | Admitting: Cardiology

## 2016-01-05 NOTE — Telephone Encounter (Signed)
Patient called to report a metallic taste in her mouth after having a breast biopsy and unknown implant yesterday. She st she has an appointment with her doctor today and hopefully will remove whatever is making her have a distaste. Wished patient good luck with appointment and procedure.

## 2016-01-05 NOTE — Telephone Encounter (Signed)
Pt had a breat biopsy yesterday. They put a titanium clip in her,since than been coughing real bad. She also says she have this chemical taste in her mouth.She called them,they said they had never heard of this. She wonder if this might be,because she have stents or an allergic reaction.s .

## 2016-01-05 NOTE — Progress Notes (Signed)
Cardiology Office Note:    Date:  01/06/2016   ID:  Penny James, DOB 1951-06-21, MRN [id number]  PCP:  Lucretia Kern., DO  Cardiologist:  Dr. Fransico Him   Electrophysiologist:  n/a  Chief Complaint  Patient presents with  . Hospitalization Follow-up    admx with a/c CHF    History of Present Illness:     Penny James is a 65 y.o. female with a hx of CAD s/p CABG, HTN, diabetes, ischemic cardiomyopathy, combined systolic and diastolic CHF, moderate to severe mitral regurgitation, PAD status post prior renal artery stenting, HL, carotid stenosis status post R CEA, CKD, bipolar affective d/o.   Admitted 12/16 with a non-STEMI. LHC demonstrated occluded vein grafts to the diagonal, ramus and distal LCx. LIMA-LAD was patent. She was not felt to be a surgical candidate. High risk left main PCI with an assist device was felt to be an option. Patient opted for medical therapy.  Admitted 3/2-3/3 with acute on chronic combined systolic and diastolic CHF. Cardiac enzymes remained negative. She was diuresed with IV Lasix. Isosorbide was added to her medical regimen.  She had a breast bx earlier this week for a breast mass.  Path c/w DCIS.  She sees oncology next week.  She had FU carotid US recently and LICA [id number].  She sees Dr. Donnetta Hutching next week.   She returns for FU.  She is doing well. She denies significant dyspnea. She denies chest discomfort. Overall, she is NYHA 2-2b. She has a nonproductive cough. She sleeps in a recliner. She has done this for years without change. She denies PND or edema. She denies syncope.  Past Medical History  Diagnosis Date  . Hyperlipidemia   . Arthritis   . Anemia   . Chronic combined systolic and diastolic CHF (congestive heart failure) ([place])     a. EF 55% by cath 2010 but in 09/2015 found to be 20%, with mod-severe MR, grade 2 DD.  . Bipolar affective disorder ([place])   . GERD (gastroesophageal reflux disease)   . Obesity   . Peripheral  neuropathy ([place])   . 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 CAD (coronary artery disease)     a. CABG in 2002. b. Cath 09/2015: poor targets, not a good surgical candidate, high risk PCI reserved for recurrent symptoms.  . Carotid artery stenosis     60-79% bilateral ICA stenosis s/p Right CEA 2013  followed by Dr. Donnetta Hutching  . Mitral regurgitation     a. Mod-severe by echo 09/2015.  . Ischemic cardiomyopathy   . CKD (chronic kidney disease), stage III     Past Surgical History  Procedure Laterality Date  . Abdominal hysterectomy  1984  . Sp pta renal / visc  artery  2003    with stent of left renal artery  . Tonsillectomy      AS A CHILD  . Endarterectomy  02/18/2012    rocedure: ENDARTERECTOMY CAROTID;  Surgeon: Rosetta Posner, MD;  Location: [facility] OR;  Service: Vascular;  Laterality: Right;  Right Carotid endarterectomy with Dacron patch angioplasty with resection of internal carotid artery  . Coronary artery bypass graft      3 vessel coronary disease S?P CABG with widely patent grafts by cath in 2005 and distal disease past the graft insertion sites.  . Cardiac  catheterization  03/25/09    Occluded SVG to intermediate and SVG to OM/posterior descending artery with PTCA left circ 6/10 s/p PTCA stent in SVG to OM/PDA, 07/2002 s/p PCI of left circ 03/2009  . Cardiac catheterization N/A 10/05/2015    Procedure: Left Heart Cath and Cors/Grafts Angiography;  Surgeon: Troy Sine, MD;  Location: [facility];  Service: Cardiovascular;  Laterality: N/A;    Current Medications: Outpatient Prescriptions Prior to Visit  Medication Sig Dispense Refill  . acetaminophen (TYLENOL) 325 MG tablet Take 2 tablets (650 mg total) by mouth every 4 (four) hours as needed for headache or mild pain.    [name] ALDACTONE 25 MG tablet Take 0.5 tablets (12.5 mg total) by  mouth daily. 15 tablet 11  . aspirin EC 81 MG tablet Take 81 mg by mouth daily.    [name] BAYER MICROLET LANCETS lancets Use as instructed 100 each 3  . carvedilol (COREG) 6.25 MG tablet Take 3 tablets (18.75 mg total) by mouth 2 (two) times daily with a meal. 100 tablet 11  . CRESTOR 40 MG tablet TAKE 1 TABLET BY MOUTH EVERY DAY 30 tablet 11  . ferrous sulfate 325 (65 FE) MG tablet Take 325 mg by mouth daily with breakfast.    . furosemide (LASIX) 80 MG tablet Take 1 tablet (80 mg total) by mouth every morning. 30 tablet 11  . Glucose Blood (BAYER BREEZE 2 TEST) DISK Test blood sugar four times a day 200 each 5  . isosorbide mononitrate (IMDUR) 30 MG 24 hr tablet Take 1 tablet (30 mg total) by mouth daily. OK to use name brand Imdur if pt prefers. 30 tablet 11  . Multiple Vitamins-Minerals (MULTIVITAL) tablet Take 1 tablet by mouth daily.     . nitroGLYCERIN (NITROSTAT) 0.4 MG SL tablet Place 1 tablet (0.4 mg total) under the tongue every 5 (five) minutes as needed for chest pain. 25 tablet 2  . PLAVIX 75 MG tablet TAKE 1 TABLET BY MOUTH EVERY DAY 90 tablet 0  . ZETIA 10 MG tablet TAKE 1 TABLET BY MOUTH EVERY DAY 90 tablet 0  . olopatadine (PATANOL) 0.1 % ophthalmic solution Place 1 drop into both eyes 2 (two) times daily. (Patient taking differently: Place 1 drop into both eyes 2 (two) times daily as needed for allergies. ) 5 mL 1  . PREVACID 15 MG capsule TAKE ONE CAPSULE EVERY DAY 90 capsule 0  . ZESTRIL 5 MG tablet Take 1 tablet (5 mg total) by mouth daily. 30 tablet 11   No facility-administered medications prior to visit.     Allergies:   Penicillins; Protamine; and Amlodipine   Social History   Social History  . Marital Status: Married    Spouse Name: N/A  . Number of Children: 3  . Years of Education: N/A   Occupational History  . DISABLED    Social History Main Topics  . Smoking status: Never Smoker   . Smokeless tobacco: Never Used  . Alcohol Use: No  . Drug Use: No  .  Sexual Activity: No   Other Topics Concern  . None   Social History Narrative     Family History:  The patient's family history includes Cerebral aneurysm in her brother; Clotting disorder in her sister; Coronary artery disease in her paternal grandmother; Glaucoma in her father; Heart disease in her brother; Hypertension in her father; Stroke in her paternal grandmother.   ROS:   Please see the history of present illness.  Review of Systems  Respiratory: Positive for cough.   All other systems reviewed and are negative.   Physical Exam:    VS:  BP 124/60 mmHg  Pulse 70  Ht 5\' 3"  (1.6 m)  Wt 149 lb 1.9 oz (67.64 kg)  BMI 26.42 kg/m2   GEN: Well nourished, well developed, in no acute distress HEENT: normal Neck: no JVD, no masses Cardiac: Normal S1/S2, RRR; no murmurs, rubs, or gallops, no edema    Respiratory:  clear to auscultation bilaterally; no wheezing, rhonchi or rales GI: soft, nontender, nondistended MS: no deformity or atrophy Skin: warm and dry Neuro: No focal deficits  Psych: Alert and oriented x 3, normal affect  Wt Readings from Last 3 Encounters:  01/06/16 149 lb 1.9 oz (67.64 kg)  01/04/16 148 lb 3.2 oz (67.223 kg)  12/30/15 147 lb 4.8 oz (66.815 kg)      Studies/Labs Reviewed:     EKG:  EKG is  ordered today.  The ekg ordered today demonstrates NSR, HR 70, IVCD, nonspecific ST-T wave changes, QTc 449 ms, no change from prior tracing  Recent Labs: 04/01/2015: Pro B Natriuretic peptide (BNP) 460.0* 09/09/2015: TSH 2.595 10/03/2015: Magnesium 2.1 12/29/2015: ALT 28; Hemoglobin 11.2*; Platelets 154 12/30/2015: B Natriuretic Peptide 1975.5*; BUN 21*; Creatinine, Ser 1.24*; Potassium 3.7; Sodium 137   Recent Lipid Panel    Component Value Date/Time   CHOL 108 12/09/2015 1229   TRIG 52.0 12/09/2015 1229   HDL 34.50* 12/09/2015 1229   CHOLHDL 3 12/09/2015 1229   VLDL 10.4 12/09/2015 1229   LDLCALC 63 12/09/2015 1229    Additional studies/ records  that were reviewed today include:   Carotid US 12/09/15 R CEA patent with [id number] LICA [id number] R vertebral occluded  LHC 12/16 LM 75% LAD ostial 75%, mid 80%, distal 99% RI ostial 95% LCx ostial 50%, mid 60%, OM1 80% RCA ostial 100% SVG-L PDA 100% SVG-D1 1 100% SVG-RI 100% LIMA-LAD normal Ischemic cardiomyopathy with severe LV dysfunction with an ejection fraction of 20-25% with hypokinesis anterolaterally, focal apical akinesis, severe hypokinesis inferiorly with evidence for probable 3+ mitral regurgitation. Severe native CAD with 75% distal left main stenosis. Prior to trifurcating into the LAD, ramus intermediate, and left circumflex vessel. 75% ostial LAD stenosis with a flush and fill phenomena seen in the mid LAD after the proximal septal and diagonal vessel due to competitive filling from the LIMA graft. 95 - 99% diffuse ramus intermediate stenosis proximally with faint filling the mid vessel which appeared small caliber. Large dominant left circumflex coronary artery with 50% ostial stenosis followed by 30% proximal stenosis, 80% stenosis of a small obtuse marginal branch diffusely proximally, 60% mid AV groove stenosis in the region of another marginal vessel, and mild irregularity distally into the PDA with evidence for collateralization to a nondominant RCA retrograde up to near its ostium. Total flush occlusion of the native RCA at its origin. Total occlusion of all 3 saphenous vein grafts arising from the aorta which previously had supplied the diagonal vessel, a ramus intermediate vessel, and sequentially supplied a marginal branch and PDA branch of a dominant circumflex. Patent LIMA graft that supplies the mid LAD. The LAD fills proximal to the insertion and there is narrowing of 80% prior to giving rise to a moderate-sized diagonal vessel. Beyond the anastomosis, after the takeoff of a smaller diagonal vessel which collateralizes portion of the previous diagonal, the distal  portion of the native LAD had a 99% stenosis before the apex.  RECOMMENDATION: Angiograms will be reviewed with colleagues. Prior to any decision concerning possible intervention, Dr. Cyndia Bent will be consulted to see if the patient is a candidate for redo CABG surgery in light of her graft occlusions and distal left main disease. If she is turned down for surgery, consider possible percutaneous coronary intervention into the distal native LAD via the LIMA graft and possible high risk left main and circumflex intervention with hemodynamic support.   Echo 12/16 EF 20-25%, diffuse HK with inferolateral AK, grade 2 diastolic dysfunction, MAC, moderate to severe MR, moderate LAE, PASP 48 mmHg  Myoview 7/10 Apical Lat ischemia, inf and apical ischemia, EF 53%   ASSESSMENT:     1. Chronic combined systolic and diastolic congestive heart failure ([place])   2. Ischemic cardiomyopathy   3. Coronary artery disease involving native coronary artery of native heart without angina pectoris   4. Carotid stenosis, bilateral   5. Breast mass   6. Mitral regurgitation   7. CKD (chronic kidney disease), unspecified stage   8. Essential hypertension     PLAN:     In order of problems listed above:  1. Chronic Combined Systolic and Diastolic CHF - Volume stable. She is tolerating her current dose of Lasix.  Obtain FU BMET today.  2. Ischemic CM - EF 20-25%.  Continue nitrates, beta-blocker, ACE inhibitor, Spironolactone.  I am not sure about her candidacy for ICD. Will review with Dr. Fransico Him timing of repeat Echo to recheck LVEF.    3. CAD - No angina.  Continue ASA, Plavix, statin, nitrates, beta-blocker.    4. Carotid stenosis - She sees [facility] next week.  She may need L CEA.  With her extensive CAD and 3/4 grafts occluded on LHC in 12/16, she is likely to be high risk for any procedure.  I will bring her back in close FU with Dr. Fransico Him or me.  5. Breast mass - s/p recent bx.  She sees  oncology next week.  As noted, she is likely high risk for any procedure given her extensive CAD that is treated medically.  Will review with Dr. Fransico Him and bring her back for close FU.   6. Mitral Regurgitation - Mod to severe by recent echo.    7. CKD - Repeat BMET today.  8. HTN - Controlled.     Medication Adjustments/Labs and Tests Ordered: Current medicines are reviewed at length with the patient today.  Concerns regarding medicines are outlined above.  Medication changes, Labs and Tests ordered today are outlined in the Patient Instructions noted below. Patient Instructions  Medication Instructions:  Your physician recommends that you continue on your current medications as directed. Please refer to the Current Medication list given to you today.  Labwork: TODAY BMET  Testing/Procedures: NONE  Follow-Up: 02/03/16 @ 9:10 WITH Ambrosio Reuter, PAC   Any Other Special Instructions Will Be Listed Below (If Applicable).  If you need a refill on your cardiac medications before your next appointment, please call your pharmacy.   Signed, Richardson Dopp, PA-C  01/06/2016 2:18 PM    [facility] [place], [place], [place]  [address] Phone: [phone]; Fax: [phone]

## 2016-01-06 ENCOUNTER — Telehealth: Payer: Self-pay | Admitting: *Deleted

## 2016-01-06 ENCOUNTER — Encounter: Payer: Self-pay | Admitting: Vascular Surgery

## 2016-01-06 ENCOUNTER — Ambulatory Visit (HOSPITAL_COMMUNITY): Payer: Medicare Other

## 2016-01-06 ENCOUNTER — Encounter: Payer: Self-pay | Admitting: Physician Assistant

## 2016-01-06 ENCOUNTER — Ambulatory Visit (INDEPENDENT_AMBULATORY_CARE_PROVIDER_SITE_OTHER): Payer: Medicare Other | Admitting: Physician Assistant

## 2016-01-06 VITALS — BP 124/60 | HR 70 | Ht 63.0 in | Wt 149.1 lb

## 2016-01-06 DIAGNOSIS — I255 Ischemic cardiomyopathy: Secondary | ICD-10-CM

## 2016-01-06 DIAGNOSIS — N189 Chronic kidney disease, unspecified: Secondary | ICD-10-CM

## 2016-01-06 DIAGNOSIS — I5042 Chronic combined systolic (congestive) and diastolic (congestive) heart failure: Secondary | ICD-10-CM

## 2016-01-06 DIAGNOSIS — I509 Heart failure, unspecified: Secondary | ICD-10-CM | POA: Diagnosis not present

## 2016-01-06 DIAGNOSIS — I340 Nonrheumatic mitral (valve) insufficiency: Secondary | ICD-10-CM

## 2016-01-06 DIAGNOSIS — I10 Essential (primary) hypertension: Secondary | ICD-10-CM

## 2016-01-06 DIAGNOSIS — C50912 Malignant neoplasm of unspecified site of left female breast: Secondary | ICD-10-CM

## 2016-01-06 DIAGNOSIS — I2510 Atherosclerotic heart disease of native coronary artery without angina pectoris: Secondary | ICD-10-CM | POA: Diagnosis not present

## 2016-01-06 DIAGNOSIS — N63 Unspecified lump in unspecified breast: Secondary | ICD-10-CM

## 2016-01-06 DIAGNOSIS — I6523 Occlusion and stenosis of bilateral carotid arteries: Secondary | ICD-10-CM

## 2016-01-06 LAB — BASIC METABOLIC PANEL
BUN: 20 mg/dL (ref 7–25)
CALCIUM: 9.3 mg/dL (ref 8.6–10.4)
CO2: 23 mmol/L (ref 20–31)
Chloride: 98 mmol/L (ref 98–110)
Creat: 1.16 mg/dL — ABNORMAL HIGH (ref 0.50–0.99)
GLUCOSE: 96 mg/dL (ref 65–99)
Potassium: 4.3 mmol/L (ref 3.5–5.3)
Sodium: 135 mmol/L (ref 135–146)

## 2016-01-06 NOTE — Patient Instructions (Addendum)
Medication Instructions:  Your physician recommends that you continue on your current medications as directed. Please refer to the Current Medication list given to you today.  Labwork: TODAY BMET  Testing/Procedures: NONE  Follow-Up: 02/03/16 @ 9:10 WITH SCOTT WEAVER, PAC   Any Other Special Instructions Will Be Listed Below (If Applicable).  If you need a refill on your cardiac medications before your next appointment, please call your pharmacy.

## 2016-01-06 NOTE — Telephone Encounter (Signed)
  Oncology Nurse Navigator Documentation  Navigator Location: [facility] (01/06/16 1100) Navigator Encounter Type: Introductory phone call (01/06/16 1100)  Called patient to confirm [facility] appointment 01/11/16 at 0800.  Patient given instructions and contact information.  Patient able to teach back information.  I encouraged patient to call for any questions or concerns.             Barriers/Navigation Needs: Coordination of Care (01/06/16 1100)   Interventions: Other (Confirmed [facility] appointment and answered questions) (01/06/16 1100)            Acuity: Level 1 (01/06/16 1100) Acuity Level 1: Initial guidance, education and coordination as needed (01/06/16 1100)       Time Spent with Patient: 15 (01/06/16 1100)

## 2016-01-08 ENCOUNTER — Encounter (HOSPITAL_COMMUNITY): Payer: Self-pay | Admitting: Family Medicine

## 2016-01-08 ENCOUNTER — Emergency Department (HOSPITAL_COMMUNITY)
Admission: EM | Admit: 2016-01-08 | Discharge: 2016-01-08 | Disposition: A | Discharge status: Home / Self Care | Payer: Medicare Other | Attending: Emergency Medicine | Admitting: Emergency Medicine

## 2016-01-08 DIAGNOSIS — E669 Obesity, unspecified: Secondary | ICD-10-CM | POA: Diagnosis not present

## 2016-01-08 DIAGNOSIS — Z7901 Long term (current) use of anticoagulants: Secondary | ICD-10-CM | POA: Diagnosis not present

## 2016-01-08 DIAGNOSIS — Z880 Allergy status to penicillin: Secondary | ICD-10-CM | POA: Insufficient documentation

## 2016-01-08 DIAGNOSIS — I129 Hypertensive chronic kidney disease with stage 1 through stage 4 chronic kidney disease, or unspecified chronic kidney disease: Secondary | ICD-10-CM | POA: Insufficient documentation

## 2016-01-08 DIAGNOSIS — K219 Gastro-esophageal reflux disease without esophagitis: Secondary | ICD-10-CM | POA: Insufficient documentation

## 2016-01-08 DIAGNOSIS — Z9889 Other specified postprocedural states: Secondary | ICD-10-CM | POA: Insufficient documentation

## 2016-01-08 DIAGNOSIS — I2510 Atherosclerotic heart disease of native coronary artery without angina pectoris: Secondary | ICD-10-CM | POA: Insufficient documentation

## 2016-01-08 DIAGNOSIS — Z8669 Personal history of other diseases of the nervous system and sense organs: Secondary | ICD-10-CM | POA: Insufficient documentation

## 2016-01-08 DIAGNOSIS — M1990 Unspecified osteoarthritis, unspecified site: Secondary | ICD-10-CM | POA: Insufficient documentation

## 2016-01-08 DIAGNOSIS — R42 Dizziness and giddiness: Secondary | ICD-10-CM | POA: Diagnosis present

## 2016-01-08 DIAGNOSIS — Z8659 Personal history of other mental and behavioral disorders: Secondary | ICD-10-CM | POA: Diagnosis not present

## 2016-01-08 DIAGNOSIS — Z951 Presence of aortocoronary bypass graft: Secondary | ICD-10-CM | POA: Insufficient documentation

## 2016-01-08 DIAGNOSIS — Z79899 Other long term (current) drug therapy: Secondary | ICD-10-CM | POA: Diagnosis not present

## 2016-01-08 DIAGNOSIS — I5042 Chronic combined systolic (congestive) and diastolic (congestive) heart failure: Secondary | ICD-10-CM | POA: Insufficient documentation

## 2016-01-08 DIAGNOSIS — E119 Type 2 diabetes mellitus without complications: Secondary | ICD-10-CM | POA: Insufficient documentation

## 2016-01-08 DIAGNOSIS — Z7982 Long term (current) use of aspirin: Secondary | ICD-10-CM | POA: Diagnosis not present

## 2016-01-08 DIAGNOSIS — D649 Anemia, unspecified: Secondary | ICD-10-CM | POA: Insufficient documentation

## 2016-01-08 DIAGNOSIS — R61 Generalized hyperhidrosis: Secondary | ICD-10-CM | POA: Diagnosis not present

## 2016-01-08 DIAGNOSIS — Z853 Personal history of malignant neoplasm of breast: Secondary | ICD-10-CM | POA: Insufficient documentation

## 2016-01-08 DIAGNOSIS — N183 Chronic kidney disease, stage 3 (moderate): Secondary | ICD-10-CM | POA: Insufficient documentation

## 2016-01-08 DIAGNOSIS — I252 Old myocardial infarction: Secondary | ICD-10-CM | POA: Insufficient documentation

## 2016-01-08 DIAGNOSIS — E785 Hyperlipidemia, unspecified: Secondary | ICD-10-CM | POA: Diagnosis not present

## 2016-01-08 NOTE — ED Notes (Signed)
Pt here stating that she wants the clip in her left breat removed. sts was placed for mammogram and since has been making her sick. sts unable to eat and sweating.

## 2016-01-08 NOTE — ED Provider Notes (Signed)
CSN: [id number]     Arrival date & time 01/08/16  [id number] History   First MD Initiated Contact with Patient 01/08/16 [phone]     Chief Complaint  Patient presents with  . wants clip in left breast removed    (Consider location/radiation/quality/duration/timing/severity/associated sxs/prior Treatment) HPI 65 y.o. female with a hx of CAD, CHF, CKD, presents to the Emergency Department today complaining of a titanium clip placed in her breast on Wednesday for a breast biopsy for malignancy. States that she started feeling dizzy, sweaty, and has a chemical taste in her mouth. No CP/SOB/ABD pain. No N/V/D. No headache. No numbness/tingling. No fevers. Notes that she saw the same doctor who put it in on Thursday and stated that the symptoms were unusual and to follow up with Cancer Education at [facility]. No other symptoms noted.   Past Medical History  Diagnosis Date  . Hyperlipidemia   . Arthritis   . Anemia   . Chronic combined systolic and diastolic CHF (congestive heart failure) ([place])     a. EF 55% by cath 2010 but in 09/2015 found to be 20%, with mod-severe MR, grade 2 DD.  . Bipolar affective disorder ([place])   . GERD (gastroesophageal reflux disease)   . Obesity   . Peripheral neuropathy ([place])   . 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 CAD (coronary artery disease)     a. CABG in 2002. b. Cath 09/2015: poor targets, not a good surgical candidate, high risk PCI reserved for recurrent symptoms.  . Carotid artery stenosis     60-79% bilateral ICA stenosis s/p Right CEA 2013  followed by Dr. Donnetta Hutching  . Mitral regurgitation     a. Mod-severe by echo 09/2015.  . Ischemic cardiomyopathy   . CKD (chronic kidney disease), stage III    Past Surgical History  Procedure Laterality Date  . Abdominal hysterectomy  1984  . Sp pta renal / visc  artery  2003    with  stent of left renal artery  . Tonsillectomy      AS A CHILD  . Endarterectomy  02/18/2012    rocedure: ENDARTERECTOMY CAROTID;  Surgeon: Rosetta Posner, MD;  Location: [facility] OR;  Service: Vascular;  Laterality: Right;  Right Carotid endarterectomy with Dacron patch angioplasty with resection of internal carotid artery  . Coronary artery bypass graft      3 vessel coronary disease S?P CABG with widely patent grafts by cath in 2005 and distal disease past the graft insertion sites.  . Cardiac catheterization  03/25/09    Occluded SVG to intermediate and SVG to OM/posterior descending artery with PTCA left circ 6/10 s/p PTCA stent in SVG to OM/PDA, 07/2002 s/p PCI of left circ 03/2009  . Cardiac catheterization N/A 10/05/2015    Procedure: Left Heart Cath and Cors/Grafts Angiography;  Surgeon: Troy Sine, MD;  Location: [place] [facility] LAB;  Service: Cardiovascular;  Laterality: N/A;   Family History  Problem Relation Age of Onset  . Heart disease Brother     Heart Disease before age 105  . Glaucoma Father   . Hypertension Father   . Stroke Paternal Grandmother   . Coronary artery disease Paternal Grandmother   . Cerebral aneurysm Brother   . Clotting disorder Sister    Social History  Substance Use Topics  . Smoking status: Never  Smoker   . Smokeless tobacco: Never Used  . Alcohol Use: No   OB History    No data available     Review of Systems ROS reviewed and all are negative for acute change except as noted in the HPI.  Allergies  Penicillins; Protamine; and Amlodipine  Home Medications   Prior to Admission medications   Medication Sig Start Date End Date Taking? Authorizing Provider  acetaminophen (TYLENOL) 325 MG tablet Take 2 tablets (650 mg total) by mouth every 4 (four) hours as needed for headache or mild pain. 10/10/15   Luke K Kilroy, PA-C  ALDACTONE 25 MG tablet Take 0.5 tablets (12.5 mg total) by mouth daily. 12/15/15   Sueanne Margarita, MD  aspirin EC 81 MG tablet Take  81 mg by mouth daily.    Historical Provider, MD  BAYER MICROLET LANCETS lancets Use as instructed 08/19/15   Lucretia Kern, DO  carvedilol (COREG) 6.25 MG tablet Take 3 tablets (18.75 mg total) by mouth 2 (two) times daily with a meal. 10/10/15   Luke K Kilroy, PA-C  CRESTOR 40 MG tablet TAKE 1 TABLET BY MOUTH EVERY DAY 09/14/15   Sueanne Margarita, MD  ferrous sulfate 325 (65 FE) MG tablet Take 325 mg by mouth daily with breakfast.    Historical Provider, MD  furosemide (LASIX) 80 MG tablet Take 1 tablet (80 mg total) by mouth every morning. 10/10/15   Erlene Quan, PA-C  Glucose Blood (BAYER BREEZE 2 TEST) DISK Test blood sugar four times a day 08/19/15   Lucretia Kern, DO  isosorbide mononitrate (IMDUR) 30 MG 24 hr tablet Take 1 tablet (30 mg total) by mouth daily. OK to use name brand Imdur if pt prefers. 12/30/15   Rhonda G Barrett, PA-C  lansoprazole (PREVACID) 15 MG capsule Take 15 mg by mouth daily.    Historical Provider, MD  Multiple Vitamins-Minerals (MULTIVITAL) tablet Take 1 tablet by mouth daily.     Historical Provider, MD  nitroGLYCERIN (NITROSTAT) 0.4 MG SL tablet Place 1 tablet (0.4 mg total) under the tongue every 5 (five) minutes as needed for chest pain. 10/10/15   Erlene Quan, PA-C  olopatadine (PATANOL) 0.1 % ophthalmic solution Place 1 drop into both eyes 2 (two) times daily as needed for allergies.    Historical Provider, MD  PLAVIX 75 MG tablet TAKE 1 TABLET BY MOUTH EVERY DAY 12/19/15   Sueanne Margarita, MD  ZESTRIL 2.5 MG tablet Take 2.5 mg by mouth daily. 11/20/15   Historical Provider, MD  ZETIA 10 MG tablet TAKE 1 TABLET BY MOUTH EVERY DAY 11/15/15   Sueanne Margarita, MD   BP 147/80 mmHg  Pulse 76  Temp(Src) 97.6 F (36.4 C) (Oral)  Resp 18  Ht 5\' 3"  (1.6 m)  Wt 67.45 kg  BMI 26.35 kg/m2  SpO2 100%   Physical Exam  Constitutional: She is oriented to person, place, and time. She appears well-developed and well-nourished.  HENT:  Head: Normocephalic and atraumatic.   Eyes: EOM are normal. Pupils are equal, round, and reactive to light.  Neck: Normal range of motion. Neck supple. No tracheal deviation present.  Cardiovascular: Normal rate, regular rhythm, normal heart sounds and intact distal pulses.   Pulmonary/Chest: Effort normal and breath sounds normal. No respiratory distress.  Biopsy incision noted on left breast. No signs of erythema, drainage, infection. Non TTP  Abdominal: Soft. Bowel sounds are normal. There is no tenderness.  Musculoskeletal: Normal range of  motion.  Neurological: She is alert and oriented to person, place, and time.  Skin: Skin is warm and dry.  Psychiatric: She has a normal mood and affect. Her behavior is normal. Thought content normal.  Nursing note and vitals reviewed.  ED Course  Procedures (including critical care time) Labs Review Labs Reviewed - No data to display  Imaging Review No results found. I have personally reviewed and evaluated these images and lab results as part of my medical decision-making.   EKG Interpretation None      MDM  I have reviewed the relevant previous healthcare records. I obtained HPI from historian. Patient discussed with supervising physician  ED Course:  Assessment: Pt is a 79yF with hx CHF, CKD, CAD who presents wanting to have L breast clip removed s/p breast biopsy on Wednesday. Notes dizziness, diaphoresis. No anaphylaxis. No hives. No urticarial reaction. No erythema on biopsy site. No signs of infection. On exam, pt in NAD. Nontoxic/nonseptic appearing. VSS. Afebrile. Lungs CTA. Heart RRR. Abdomen nontender soft. Plan is to D/C patient home with follow up to physician who placed clip for biospy. At time of discharge, Patient is in no acute distress. Vital Signs are stable. Patient is able to ambulate. Patient able to tolerate PO.   Disposition/Plan:  DC Home Additional Verbal discharge instructions given and discussed with patient.  Pt Instructed to f/u with  PCP Return precautions given Pt acknowledges and agrees with plan  Supervising Physician Orlie Dakin, MD   Final diagnoses:  Dizzy       Shary Decamp, PA-C 01/08/16 [place], MD 01/08/16 [phone]

## 2016-01-08 NOTE — ED Notes (Signed)
Pt reports she wants a clip that was placed in her left breast removed. Pt states she already went back to the MD that did the procedure and they told the patient that they did not know what she was referring to. Pt reports she did have a biopsy done and is supposed to go to a cancer education class tomorrow.

## 2016-01-08 NOTE — ED Provider Notes (Signed)
Patient complains of a metallic taste in her mouth since titanium clip was put in her left breast 4 days ago. She reports that she wants the clip to be removed No other associated symptoms. Presently asymptomatic except for metallic taste. Patient is alert and nontoxic. Left breast there is a 2 cm purplish ecchymosis at the lateral aspect. No tenderness. I've requested the patient to contact Dr. Isaiah Blakes to see about removing the clip.  Penny Dakin, MD 01/08/16 1028

## 2016-01-08 NOTE — Discharge Instructions (Signed)
Please read and follow all provided instructions.  Your diagnoses today include:  1. Dizzy    Tests performed today include:  Vital signs. See below for your results today.   Medications prescribed:   None   Home care instructions:  Follow any educational materials contained in this packet.  Follow-up instructions: Please follow-up with physician who placed clip for possible removal   Return instructions:   Please return to the Emergency Department if you do not get better, if you get worse, or new symptoms OR  - Fever (temperature greater than 101.20F)  - Bleeding that does not stop with holding pressure to the area    -Severe pain (please note that you may be more sore the day after your accident)  - Chest Pain  - Difficulty breathing  - Severe nausea or vomiting  - Inability to tolerate food and liquids  - Passing out  - Skin becoming red around your wounds  - Change in mental status (confusion or lethargy)  - New numbness or weakness     Please return if you have any other emergent concerns.  Additional Information:  Your vital signs today were: BP 147/80 mmHg   Pulse 76   Temp(Src) 97.6 F (36.4 C) (Oral)   Resp 18   Ht 5\' 3"  (1.6 m)   Wt 67.45 kg   BMI 26.35 kg/m2   SpO2 100% If your blood pressure (BP) was elevated above 135/85 this visit, please have this repeated by your doctor within one month. ---------------

## 2016-01-09 ENCOUNTER — Other Ambulatory Visit: Payer: Self-pay | Admitting: Family Medicine

## 2016-01-09 ENCOUNTER — Ambulatory Visit (HOSPITAL_COMMUNITY): Payer: Medicare Other

## 2016-01-09 ENCOUNTER — Telehealth: Payer: Self-pay | Admitting: *Deleted

## 2016-01-09 ENCOUNTER — Encounter: Payer: Self-pay | Admitting: *Deleted

## 2016-01-09 NOTE — Telephone Encounter (Signed)
Pt notified of lab results by phone with verbal understanding.  

## 2016-01-09 NOTE — Telephone Encounter (Signed)
Please check with pt. This is not on current medication list and potassium was normal last check. Would not advise restarting if has not been taking. Thanks.

## 2016-01-10 ENCOUNTER — Ambulatory Visit: Payer: Medicare Other | Admitting: Vascular Surgery

## 2016-01-11 ENCOUNTER — Encounter: Payer: Self-pay | Admitting: Physical Therapy

## 2016-01-11 ENCOUNTER — Ambulatory Visit (HOSPITAL_COMMUNITY): Payer: Medicare Other

## 2016-01-11 ENCOUNTER — Other Ambulatory Visit: Payer: Self-pay | Admitting: General Surgery

## 2016-01-11 ENCOUNTER — Other Ambulatory Visit: Payer: Medicare Other

## 2016-01-11 ENCOUNTER — Ambulatory Visit (HOSPITAL_BASED_OUTPATIENT_CLINIC_OR_DEPARTMENT_OTHER): Payer: Medicare Other | Admitting: Hematology and Oncology

## 2016-01-11 ENCOUNTER — Encounter: Payer: Self-pay | Admitting: Hematology and Oncology

## 2016-01-11 ENCOUNTER — Telehealth: Payer: Self-pay | Admitting: Hematology and Oncology

## 2016-01-11 ENCOUNTER — Ambulatory Visit: Payer: Medicare Other | Attending: General Surgery | Admitting: Physical Therapy

## 2016-01-11 ENCOUNTER — Encounter: Payer: Self-pay | Admitting: Nurse Practitioner

## 2016-01-11 ENCOUNTER — Encounter: Payer: Self-pay | Admitting: *Deleted

## 2016-01-11 ENCOUNTER — Other Ambulatory Visit (HOSPITAL_BASED_OUTPATIENT_CLINIC_OR_DEPARTMENT_OTHER): Payer: Medicare Other

## 2016-01-11 ENCOUNTER — Ambulatory Visit
Admission: RE | Admit: 2016-01-11 | Discharge: 2016-01-11 | Disposition: A | Discharge status: Home / Self Care | Payer: Medicare Other | Source: Ambulatory Visit | Attending: Radiation Oncology | Admitting: Radiation Oncology

## 2016-01-11 ENCOUNTER — Ambulatory Visit: Payer: Medicare Other | Admitting: Radiation Oncology

## 2016-01-11 ENCOUNTER — Encounter: Payer: Self-pay | Admitting: Skilled Nursing Facility1

## 2016-01-11 VITALS — BP 115/50 | HR 78 | Temp 97.4°F | Resp 18 | Ht 62.0 in | Wt 147.6 lb

## 2016-01-11 DIAGNOSIS — R293 Abnormal posture: Secondary | ICD-10-CM | POA: Insufficient documentation

## 2016-01-11 DIAGNOSIS — C50912 Malignant neoplasm of unspecified site of left female breast: Secondary | ICD-10-CM | POA: Diagnosis present

## 2016-01-11 DIAGNOSIS — M4004 Postural kyphosis, thoracic region: Secondary | ICD-10-CM | POA: Insufficient documentation

## 2016-01-11 DIAGNOSIS — C50412 Malignant neoplasm of upper-outer quadrant of left female breast: Secondary | ICD-10-CM

## 2016-01-11 LAB — COMPREHENSIVE METABOLIC PANEL WITH GFR
ALT: 25 U/L (ref 0–55)
AST: 25 U/L (ref 5–34)
Albumin: 4 g/dL (ref 3.5–5.0)
Alkaline Phosphatase: 58 U/L (ref 40–150)
Anion Gap: 7 meq/L (ref 3–11)
BUN: 20.8 mg/dL (ref 7.0–26.0)
CO2: 29 meq/L (ref 22–29)
Calcium: 9.1 mg/dL (ref 8.4–10.4)
Chloride: 98 meq/L (ref 98–109)
Creatinine: 1.3 mg/dL — ABNORMAL HIGH (ref 0.6–1.1)
EGFR: 51 ml/min/1.73 m2 — ABNORMAL LOW
Glucose: 155 mg/dL — ABNORMAL HIGH (ref 70–140)
Potassium: 4.2 meq/L (ref 3.5–5.1)
Sodium: 134 meq/L — ABNORMAL LOW (ref 136–145)
Total Bilirubin: 0.54 mg/dL (ref 0.20–1.20)
Total Protein: 6.6 g/dL (ref 6.4–8.3)

## 2016-01-11 LAB — CBC WITH DIFFERENTIAL/PLATELET
BASO%: 0.8 % (ref 0.0–2.0)
Basophils Absolute: 0 10*3/uL (ref 0.0–0.1)
EOS%: 2 % (ref 0.0–7.0)
Eosinophils Absolute: 0.1 10*3/uL (ref 0.0–0.5)
HCT: 31.7 % — ABNORMAL LOW (ref 34.8–46.6)
HEMOGLOBIN: 10.8 g/dL — AB (ref 11.6–15.9)
LYMPH%: 32.4 % (ref 14.0–49.7)
MCH: 27.7 pg (ref 25.1–34.0)
MCHC: 34.1 g/dL (ref 31.5–36.0)
MCV: 81.3 fL (ref 79.5–101.0)
MONO#: 0.3 10*3/uL (ref 0.1–0.9)
MONO%: 8.9 % (ref 0.0–14.0)
NEUT%: 55.9 % (ref 38.4–76.8)
NEUTROS ABS: 2 10*3/uL (ref 1.5–6.5)
Platelets: 146 10*3/uL (ref 145–400)
RBC: 3.9 10*6/uL (ref 3.70–5.45)
RDW: 14.7 % — AB (ref 11.2–14.5)
WBC: 3.6 10*3/uL — AB (ref 3.9–10.3)
lymph#: 1.2 10*3/uL (ref 0.9–3.3)

## 2016-01-11 LAB — TECHNOLOGIST REVIEW

## 2016-01-11 NOTE — Progress Notes (Signed)
Note created by Dr. Gudena during office visit, copy to patient,original to scan. 

## 2016-01-11 NOTE — Patient Instructions (Signed)

## 2016-01-11 NOTE — Progress Notes (Signed)
Ms. Ewell is a very pleasant 65 y.o. 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  Biopsy results revealed the tumor's prognostic profile is ER negative, PR negative and HER2/neu negative.   She presents today to the [facility]) for treatment consideration and recommendations from the breast surgeon, radiation oncologist, and medical oncologist.     I briefly met with Ms. Truss during her [facility] visit today. We discussed the purpose of the Survivorship Clinic, which will include monitoring for recurrence, coordinating completion of age and gender-appropriate cancer screenings, promotion of overall wellness, as well as managing potential late/long-term side effects of anti-cancer treatments.    The treatment plan for Ms. Grosshans will likely include surgery, chemotherapy, and radiation therapy. As of today, the intent of treatment for Ms. Glandon is cure, therefore she will be eligible for the Survivorship Clinic upon her completion of treatment.  Her survivorship care plan (SCP) document will be drafted and updated throughout the course of her treatment trajectory. She will receive the SCP in an office visit with myself in the Survivorship Clinic once she has completed treatment.   Ms. Neubauer was encouraged to ask questions and all questions were answered to her satisfaction.  She was given my business card and encouraged to contact me with any concerns regarding survivorship.  I look forward to participating in her care.   Kenn File, [place] [phone]

## 2016-01-11 NOTE — Assessment & Plan Note (Signed)
Left breast biopsy 01/04/2016: Invasive ductal carcinoma with DCIS, grade 2, ER PR 0%, HER-2 negative ratio 1.32, Ki-67 80%, mammogram/ultrasound showed 1.7 cm tumor at 2:00 position, T1 cN0 stage IA clinical stage  Pathology and radiology counseling: Discussed with the patient, the details of pathology including the type of breast cancer,the clinical staging, the significance of ER, PR and HER-2/neu receptors and the implications for treatment. After reviewing the pathology in detail, we proceeded to discuss the different treatment options between surgery, radiation, chemotherapy.  Recommendation based on multidisciplinary tumor board: 1. Breast conserving surgery with sentinel lymph node biopsy 2. Adjuvant chemotherapy with Taxotere and Cytoxan every 3 weeks 4 cycles (avoiding Adriamycin because of CHF) 3. Followed by adjuvant radiation therapy  Chemotherapy counseling: I discussed risks and benefits of chemotherapy including hair loss, cytopenias, risk of infection, anemia, taste changes, fatigue, liver and kidney function changes, long-term bone marrow damage as well as leukemia. Patient understands these risks and is willing to go through chemotherapy.  We will request a port placement during her surgery. Return to clinic after surgery to discuss the final pathology report and to finalize adjuvant treatment plan.

## 2016-01-11 NOTE — Telephone Encounter (Signed)
sch appt per 3/15 pof. Pt will receive schedule at the end of today's visit

## 2016-01-11 NOTE — Therapy (Signed)
[place], [place], [address] Phone: [phone]   Fax:  [phone]  Physical Therapy Evaluation  Patient Details  Name: Penny James MRN: [id number] Date of Birth: 1951-02-09 Referring Provider: Dr. Autumn Messing  Encounter Date: 01/11/2016      PT End of Session - 01/11/16 1328    Visit Number 1   Number of Visits 1   PT Start Time 2297   PT Stop Time 1155   PT Time Calculation (min) 20 min   Activity Tolerance Patient tolerated treatment well   Behavior During Therapy [facility] for tasks assessed/performed      Past Medical History  Diagnosis Date  . Hyperlipidemia   . Arthritis   . Anemia   . Chronic combined systolic and diastolic CHF (congestive heart failure) ([place])     a. EF 55% by cath 2010 but in 09/2015 found to be 20%, with mod-severe MR, grade 2 DD.  . Bipolar affective disorder ([place])   . GERD (gastroesophageal reflux disease)   . Obesity   . Peripheral neuropathy ([place])   . 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 CAD (coronary artery disease)     a. CABG in 2002. b. Cath 09/2015: poor targets, not a good surgical candidate, high risk PCI reserved for recurrent symptoms.  . Carotid artery stenosis     60-79% bilateral ICA stenosis s/p Right CEA 2013  followed by Dr. Donnetta Hutching  . Mitral regurgitation     a. Mod-severe by echo 09/2015.  . Ischemic cardiomyopathy   . CKD (chronic kidney disease), stage III   . Breast cancer [facility])     Past Surgical History  Procedure Laterality Date  . Abdominal hysterectomy  1984  . Sp pta renal / visc  artery  2003    with stent of left renal artery  . Tonsillectomy      AS A CHILD  . Endarterectomy  02/18/2012    rocedure: ENDARTERECTOMY CAROTID;  Surgeon: Rosetta Posner, MD;  Location: [facility] OR;  Service:  Vascular;  Laterality: Right;  Right Carotid endarterectomy with Dacron patch angioplasty with resection of internal carotid artery  . Coronary artery bypass graft      3 vessel coronary disease S?P CABG with widely patent grafts by cath in 2005 and distal disease past the graft insertion sites.  . Cardiac catheterization  03/25/09    Occluded SVG to intermediate and SVG to OM/posterior descending artery with PTCA left circ 6/10 s/p PTCA stent in SVG to OM/PDA, 07/2002 s/p PCI of left circ 03/2009  . Cardiac catheterization N/A 10/05/2015    Procedure: Left Heart Cath and Cors/Grafts Angiography;  Surgeon: Troy Sine, MD;  Location: [place] [facility] LAB;  Service: Cardiovascular;  Laterality: N/A;    There were no vitals filed for this visit.  Visit Diagnosis:  Postural kyphosis of thoracic region - Plan: PT plan of care cert/re-cert  Abnormal posture - Plan: PT plan of care cert/re-cert  Breast cancer, female, left - Plan: PT plan of care cert/re-cert      Subjective Assessment - 01/11/16 1320    Subjective Patient reports she is here today to meet with her medical team after recently being diagnosed with left breast cancer.   Pertinent History Patient was diagnosed on 11/17/15 with left triple negative invasive  ductal carcinoma breast cancer with a Ki67 of 80%.  It measures 1.7 cm.   Patient Stated Goals reduce lymphedema risk and learn post op shoulder ROM HEP   Currently in Pain? No/denies            [facility] PT Assessment - 01/11/16 0001    Assessment   Medical Diagnosis Left breast cancer   Referring Provider Dr. Chevis Pretty   Onset Date/Surgical Date 11/17/15   Hand Dominance Right   Prior Therapy none   Precautions   Precautions Other (comment)   Precaution Comments Active breast cancer   Restrictions   Weight Bearing Restrictions No   Balance Screen   Has the patient fallen in the past 6 months No   Has the patient had a decrease in activity level because of a fear of  falling?  No   Is the patient reluctant to leave their home because of a fear of falling?  No   Home Environment   Living Environment Private residence   Living Arrangements Spouse/significant other  She lives with her husband who is on O2 and has CHF   Available Help at Discharge Family   Prior Function   Level of Independence Independent   Vocation Retired   Leisure She does not exercise   Cognition   Overall Cognitive Status Within Functional Limits for tasks assessed   Posture/Postural Control   Posture/Postural Control Postural limitations   Postural Limitations Rounded Shoulders;Forward head;Increased thoracic kyphosis  Kyphosis limits shoulder ROM   ROM / Strength   AROM / PROM / Strength Strength;AROM   AROM   AROM Assessment Site Shoulder   Right/Left Shoulder Right;Left   Right Shoulder Extension 52 Degrees   Right Shoulder Flexion 134 Degrees   Right Shoulder Internal Rotation 136 Degrees   Right Shoulder External Rotation 52 Degrees   Right Shoulder Horizontal ABduction 72 Degrees   Left Shoulder Extension 55 Degrees   Left Shoulder Flexion 118 Degrees  Limited by kyphosis   Left Shoulder ABduction 140 Degrees   Left Shoulder Internal Rotation 58 Degrees   Left Shoulder External Rotation 82 Degrees   Strength   Overall Strength Within functional limits for tasks performed           LYMPHEDEMA/ONCOLOGY QUESTIONNAIRE - 01/11/16 1326    Type   Cancer Type Left breast cancer   Lymphedema Assessments   Lymphedema Assessments Upper extremities   Right Upper Extremity Lymphedema   10 cm Proximal to Olecranon Process 32.3 cm   Olecranon Process 24.3 cm   10 cm Proximal to Ulnar Styloid Process 21.5 cm   Just Proximal to Ulnar Styloid Process 15.8 cm   Across Hand at [facility] 19.8 cm   At [place] of 2nd Digit 6.6 cm   Left Upper Extremity Lymphedema   10 cm Proximal to Olecranon Process 29.1 cm   Olecranon Process 22.8 cm   10 cm Proximal to Ulnar  Styloid Process 20.4 cm   Just Proximal to Ulnar Styloid Process 15.4 cm   Across Hand at [facility] 19.2 cm   At [place] of 2nd Digit 6.4 cm      Patient was instructed today in a home exercise program today for post op shoulder range of motion. These included active assist shoulder flexion in sitting, scapular retraction, wall walking with shoulder abduction, and hands behind head external rotation.  She was encouraged to do these twice a day, holding 3 seconds and repeating 5 times when  permitted by her physician.         PT Education - Feb 02, 2016 1327    Education provided Yes   Education Details Lymphedema risk reduction and post op shoulder ROM HEP   Person(s) Educated Patient   Methods Explanation;Demonstration;Handout   Comprehension Returned demonstration;Verbalized understanding              Breast Clinic Goals - 02/02/16 1334    Patient will be able to verbalize understanding of pertinent lymphedema risk reduction practices relevant to her diagnosis specifically related to skin care.   Time 1   Period Days   Status Achieved   Patient will be able to return demonstrate and/or verbalize understanding of the post-op home exercise program related to regaining shoulder range of motion.   Time 1   Period Days   Status Achieved   Patient will be able to verbalize understanding of the importance of attending the postoperative After Breast Cancer Class for further lymphedema risk reduction education and therapeutic exercise.   Time 1   Period Days   Status Achieved              Plan - 2016/02/02 1328    Clinical Impression Statement Patient was diagnosed on 11/17/15 with left triple negative invasive ductal carcinoma breast cancer with a Ki67 of 80%.  It measures 1.7 cm.  She is planning to have a left lumpectomy and sentinel node biopsy followed by chemotherapy and radiation. She may benefit from post op shoulder ROM and strengthening to regain mobility and to reduce  lymphedema risk.   Pt will benefit from skilled therapeutic intervention in order to improve on the following deficits Decreased strength;Pain;Decreased knowledge of precautions;Impaired UE functional use;Decreased range of motion   Rehab Potential Excellent   Clinical Impairments Affecting Rehab Potential none   PT Frequency One time visit   PT Treatment/Interventions Therapeutic exercise;Patient/family education   PT Next Visit Plan Will f/u after surgery   PT Home Exercise Plan Post op shoulder ROM HEP   Consulted and Agree with Plan of Care Patient     Patient will follow up at outpatient cancer rehab if needed following surgery.  If the patient requires physical therapy at that time, a specific plan will be dictated and sent to the referring physician for approval. The patient was educated today on appropriate basic range of motion exercises to begin post operatively and the importance of attending the After Breast Cancer class following surgery.  Patient was educated today on lymphedema risk reduction practices as it pertains to recommendations that will benefit the patient immediately following surgery.  She verbalized good understanding.  No additional physical therapy is indicated at this time.         G-Codes - 2016-02-02 1335    Functional Assessment Tool Used Clinical Judgement   Functional Limitation Other PT primary   Other PT Primary Current Status (A2633) At least 1 percent but less than 20 percent impaired, limited or restricted   Other PT Primary Goal Status (H5456) At least 1 percent but less than 20 percent impaired, limited or restricted   Other PT Primary Discharge Status (Y5638) At least 1 percent but less than 20 percent impaired, limited or restricted       Problem List Patient Active Problem List   Diagnosis Date Noted  . Breast cancer of upper-outer quadrant of left female breast ([place]) 02-02-2016  . Acute on chronic combined systolic and diastolic heart failure  ([place]) 12/29/2015  . Coronary artery  disease involving coronary bypass graft   . Cardiomyopathy, ischemic-new 12/16   . NSTEMI (non-ST elevated myocardial infarction) ([place])   . Acute kidney injury superimposed on CKD ([place])   . Acute on chronic combined systolic and diastolic heart failure, NYHA class 3 ([place]) 10/03/2015  . Aortic atherosclerosis ([place]) 09/09/2015  . Pancytopenia ([place]) 09/09/2015  . GERD (gastroesophageal reflux disease) 08/03/2015  . Anemia 08/03/2015  . Carotid stenosis 09/03/2014  . Chronic diastolic CHF (congestive heart failure) ([place])   . DM (diabetes mellitus) type II controlled peripheral vascular disorder ([place]) 04/12/2014  . Hypokalemia 04/12/2014  . Coronary atherosclerosis of native coronary artery 02/02/2014  . Mixed hyperlipidemia 02/02/2014  . Benign essential HTN 02/02/2014  . Obesity 02/02/2014  . PVD (peripheral vascular disease) [facility])     Annia Friendly, [place] 01/11/2016 1:37 PM   [place], [place], [address] Phone: [phone]   Fax:  [phone]  Name: ARENA LINDAHL MRN: [id number] Date of Birth: 1951-09-12

## 2016-01-11 NOTE — Progress Notes (Signed)
.   Radiation Oncology         [phone] ________________________________  Initial outpatient Consultation - Date: 01/11/2016   Name: Penny James MRN: [id number]   DOB: [birth year]  REFERRING PHYSICIAN: Autumn Messing III, MD  DIAGNOSIS AND STAGE: T1c N0 Triple Negative Left Breast Cancer  HISTORY OF PRESENT ILLNESS::Penny James is a 65 y.o. female who initially palpated a mass in her left breast. Mammogram and ultrasound confirmed a mass in the upper-outer quadrant. This measured 1.7 cm. A biopsy performed showed grade 2 triple negative invasive ductal carcinoma with a Ki67 of 80%. She presents to breast multidisciplinary clinic today for my opinion regarding radiation and management for her disease. She is unaccompanied. She felt this mass in the shower.    PREVIOUS RADIATION THERAPY: No  OBGYN History:  Age of first menstrual period: 38 Still having periods?: No Ever used hormone replacement: No Children carried to term: 4 Age at first live birth: 32  Currently pregnant?: No  Ever used birth control pills or hormone shots for contraception?: No  Past medical, social and family history were reviewed in the electronic chart. Review of symptoms was reviewed in the electronic chart. Medications were reviewed in the electronic chart.   PHYSICAL EXAM: BP: 115/50 mmHg, Pulse Rate: 78, Resp: 18, Temp: 97.4 F, Temp Source: Oral, SpO2: 100%, Weight: 147 lb 9.6 oz, Height: '5\' 2"'$   This is a pleasant elderly female in no distress. Palpable mass deep within the upper-outer quadrant which is not fixed. Biopsy site is healing well. No palpable abnormalities of the right breast. No palpable cervical, supraclavicular, or axillary adenopathy. A good range of motion in bilateral arms. She appears younger than her stated age.   IMPRESSION: Penny James is a 65 y.o. female with T1c N0 Triple Negative Left Breast Cancer.   PLAN: I spoke to the patient today regarding her diagnosis and  options for treatment. We discussed the equivalence in terms of survival and local failure between mastectomy and breast conservation. We discussed the role of radiation in decreasing local failures in patients who undergo lumpectomy. We discussed the process of simulation and the placement tattoos. We discussed 6 weeks of treatment as an outpatient. We discussed the possibility of asymptomatic lung damage. We discussed the low likelihood of secondary malignancies. We discussed the possible side effects including but not limited to skin redness, fatigue, permanent skin darkening, and breast swelling.  We discussed the use of cardiac sparing with deep inspiration breath hold if needed.  We will begin radiation after chemotherapy is complete.  She met with surgery, medical oncology and physical therapy as well as our Education officer, museum and survivorship NP   I spent 40 minutes  face to face with the patient and more than 50% of that time was spent in counseling and/or coordination of care.   ------------------------------------------------  Thea Silversmith, MD  This document serves as a record of services personally performed by Thea Silversmith, MD. It was created on her behalf by Jenell Milliner, a trained medical scribe. The creation of this record is based on the scribe's personal observations and the provider's statements to them. This document has been checked and approved by the attending provider.

## 2016-01-11 NOTE — Progress Notes (Signed)
[facility] Breast Clinic Psychosocial Distress Screening Clinical Social Work  Clinical Social Work met with pt at [facility] today to introduce self, review role of CSW/Pt and Family Support Team and review distress screening protocol.  The patient scored a 7 on the Psychosocial Distress Thermometer which indicates severe distress. Clinical Social Worker met with pt to assess for distress and other psychosocial needs. Pt was alone today at clinic, she shared she had not discussed her diagnosis with family. Her son was picking her up today after clinic and we discussed how he "may know something is up since he will pick her up at the [facility]". We discussed she was welcome to bring friends and/or family to her appointments/treatments with her if she wanted to do so. She reports she will consider this option. Pt had concerns about not being able to get life insurance now that she was diagnosed with cancer. She is not concerned about her health insurance.   Pt shared her biggest concern was her anxiety. This was a new issue for her. Pt shared several coping techniques that work well for her such as reading, listening to music and playing games. We talked about how she could listen to music or read during her treatments to decrease anxiety. She agreed to reach out to the team for assistance with this as needed. CSW reviewed Breast Cancer Support Group, Support Programs and [employer] as options for support. Pt is very interested in an [employer] today and CSW made referral for her today. Pt was provided with handouts and contact info for Support Team/CSW. Pt encouraged to reach out as needed.   ONCBCN DISTRESS SCREENING 01/11/2016  Screening Type Initial Screening  Distress experienced in past week (1-10) 7  Practical problem type Insurance  Emotional problem type Nervousness/Anxiety  Referral to clinical social work Yes  Referral to support programs Yes     Clinical Social Worker  follow up needed: No.  If yes, follow up plan: Loren Racer, [place]  [facility] Phone: [phone] Fax: [phone]

## 2016-01-11 NOTE — Progress Notes (Signed)
Subjective:     Patient ID: Penny James, female   DOB: [birth year], 65 y.o.   MRN: [id number]  HPI   Review of Systems     Objective:   Physical Exam For the patient to understand and be given the tools to implement a healthy plant based diet during their cancer diagnosis.     Assessment:     Patient was seen today and found to be in good spirits. Pts ht 5'2'', 147 pounds, and BMI 27.1. Pts labs: WBC 3.6, HGB 10.8, HCT 31.7, Na 134, Glucose 155, Cr 1.3, GFR 51. Pts medications: crestor, ferrous sulfate, multivitamin, nitroglycerin. Medical dx: hypertension, HF, GERD, CKDstage 3. Pt states she is on a so salt no processed food diet. Pt states she eats 2 egg whites, whole wheat toast, and vegetables in the morning and then chicken or fish with vegetables for lunch, a piece of fruit as a night time snack. Pt is doing an excellent job with her diet and walks a mile every day but misses her peanut M and Ms desperately. Pt states she has substantial energy.     Plan:     Dietitian educated the patient on implementing a plant based diet by incorporating more plant proteins, fruits, and vegetables. As a part of a healthy routine physical activity was discussed. Dietitian educated the pt on the importance of eating throughout the day-every 3-4 hrs. Dietitian and pt agreed she could eat 4 peanut m and ms 2 times a week which elated Ms. Freeman. The importance of legitimate, evidence based information was discussed and examples were given. A folder of evidence based information with a focus on a plant based diet and general nutrition during cancer was given to the patient.  As a part of the continuum of care the cancer dietitian's contact information was given to the patient in the event they would like to have a follow up appointment.

## 2016-01-11 NOTE — Progress Notes (Signed)
[place] NOTE  Patient Care Team: Lucretia Kern, DO as PCP - General (Family Medicine) Sueanne Margarita, MD as Consulting Physician (Cardiology) Larey Dresser, MD as Consulting Physician (Cardiology)  CHIEF COMPLAINTS/PURPOSE OF CONSULTATION:  Newly diagnosed breast cancer  HISTORY OF PRESENTING ILLNESS:  Penny James 65 y.o. female is here because of recent diagnosis of left breast cancer. Patient had a palpable left breast lump that was further investigated with a mammogram and ultrasound. It revealed a 1.7 cm lesion at 2:00 position. Axilla was normal. 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 She was presented this morning at the multidisciplinary tumor board and she is here today to clinic to discuss the treatment plan. She tells me that she has 2 sons who are very watchful on her health but she did not want them to come along with her because they can be "overbearing".  I reviewed her records extensively and collaborated the history with the patient.  SUMMARY OF ONCOLOGIC HISTORY:   Breast cancer of upper-outer quadrant of left female breast ([place])   01/04/2016 Initial Diagnosis Left breast biopsy: Invasive ductal carcinoma with DCIS, grade 2, ER PR 0%, HER-2 negative ratio 1.32, Ki-67 80%, mammogram/ultrasound showed 1.7 cm tumor at 2:00 position, T1 cN0 stage IA clinical stage    In terms of breast cancer risk profile:  She menarched at early age of 37 and went to menopause at age 65  She had 4 pregnancy, her first child was born at age 17  She did not receive birth control pills She was never exposed to fertility medications or hormone replacement therapy.  She has no family history of Breast/GYN/GI cancer  MEDICAL HISTORY:  Past Medical History  Diagnosis Date  . Hyperlipidemia   . Arthritis   . Anemia   . Chronic  combined systolic and diastolic CHF (congestive heart failure) ([place])     a. EF 55% by cath 2010 but in 09/2015 found to be 20%, with mod-severe MR, grade 2 DD.  . Bipolar affective disorder ([place])   . GERD (gastroesophageal reflux disease)   . Obesity   . Peripheral neuropathy ([place])   . 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 CAD (coronary artery disease)     a. CABG in 2002. b. Cath 09/2015: poor targets, not a good surgical candidate, high risk PCI reserved for recurrent symptoms.  . Carotid artery stenosis     60-79% bilateral ICA stenosis s/p Right CEA 2013  followed by Dr. Donnetta Hutching  . Mitral regurgitation     a. Mod-severe by echo 09/2015.  . Ischemic cardiomyopathy   . CKD (chronic kidney disease), stage III   . Breast cancer [facility])     SURGICAL HISTORY: Past Surgical History  Procedure Laterality Date  . Abdominal hysterectomy  1984  . Sp pta renal / visc  artery  2003    with stent of left renal artery  . Tonsillectomy      AS A CHILD  . Endarterectomy  02/18/2012    rocedure: ENDARTERECTOMY CAROTID;  Surgeon: Rosetta Posner, MD;  Location: [facility] OR;  Service: Vascular;  Laterality: Right;  Right Carotid endarterectomy with Dacron patch angioplasty with resection of  internal carotid artery  . Coronary artery bypass graft      3 vessel coronary disease S?P CABG with widely patent grafts by cath in 2005 and distal disease past the graft insertion sites.  . Cardiac catheterization  03/25/09    Occluded SVG to intermediate and SVG to OM/posterior descending artery with PTCA left circ 6/10 s/p PTCA stent in SVG to OM/PDA, 07/2002 s/p PCI of left circ 03/2009  . Cardiac catheterization N/A 10/05/2015    Procedure: Left Heart Cath and Cors/Grafts Angiography;  Surgeon: Troy Sine, MD;  Location: [facility] CV LAB;  Service: Cardiovascular;   Laterality: N/A;    SOCIAL HISTORY: Social History   Social History  . Marital Status: Married    Spouse Name: N/A  . Number of Children: 3  . Years of Education: N/A   Occupational History  . DISABLED    Social History Main Topics  . Smoking status: Never Smoker   . Smokeless tobacco: Never Used  . Alcohol Use: No  . Drug Use: No  . Sexual Activity: No   Other Topics Concern  . Not on file   Social History Narrative    FAMILY HISTORY: Family History  Problem Relation Age of Onset  . Heart disease Brother     Heart Disease before age 8  . Glaucoma Father   . Hypertension Father   . Stroke Paternal Grandmother   . Coronary artery disease Paternal Grandmother   . Cerebral aneurysm Brother   . Clotting disorder Sister     ALLERGIES:  is allergic to penicillins; protamine; and amlodipine.  MEDICATIONS:  Current Outpatient Prescriptions  Medication Sig Dispense Refill  . ALDACTONE 25 MG tablet Take 0.5 tablets (12.5 mg total) by mouth daily. 15 tablet 11  . aspirin EC 81 MG tablet Take 81 mg by mouth daily.    [name] BAYER MICROLET LANCETS lancets Use as instructed 100 each 3  . carvedilol (COREG) 6.25 MG tablet Take 3 tablets (18.75 mg total) by mouth 2 (two) times daily with a meal. 100 tablet 11  . CRESTOR 40 MG tablet TAKE 1 TABLET BY MOUTH EVERY DAY 30 tablet 11  . ferrous sulfate 325 (65 FE) MG tablet Take 325 mg by mouth daily with breakfast.    . furosemide (LASIX) 80 MG tablet Take 1 tablet (80 mg total) by mouth every morning. 30 tablet 11  . Glucose Blood (BAYER BREEZE 2 TEST) DISK Test blood sugar four times a day 200 each 5  . isosorbide mononitrate (IMDUR) 30 MG 24 hr tablet Take 1 tablet (30 mg total) by mouth daily. OK to use name brand Imdur if pt prefers. 30 tablet 11  . lansoprazole (PREVACID) 15 MG capsule Take 15 mg by mouth daily.    . Multiple Vitamins-Minerals (MULTIVITAL) tablet Take 1 tablet by mouth daily.     . nitroGLYCERIN (NITROSTAT) 0.4  MG SL tablet Place 1 tablet (0.4 mg total) under the tongue every 5 (five) minutes as needed for chest pain. 25 tablet 2  . olopatadine (PATANOL) 0.1 % ophthalmic solution Place 1 drop into both eyes 2 (two) times daily as needed for allergies.    [name] PLAVIX 75 MG tablet TAKE 1 TABLET BY MOUTH EVERY DAY 90 tablet 0  . ZESTRIL 2.5 MG tablet Take 2.5 mg by mouth daily.  11  . ZETIA 10 MG tablet TAKE 1 TABLET BY MOUTH EVERY DAY 90 tablet 0   No current facility-administered medications for this visit.  REVIEW OF SYSTEMS:   Constitutional: Denies fevers, chills or abnormal night sweats Eyes: Denies blurriness of vision, double vision or watery eyes Ears, nose, mouth, throat, and face: Denies mucositis or sore throat Respiratory: Denies cough, dyspnea or wheezes Cardiovascular: Denies palpitation, chest discomfort or lower extremity swelling Gastrointestinal:  Denies nausea, heartburn or change in bowel habits Skin: Denies abnormal skin rashes Lymphatics: Denies new lymphadenopathy or easy bruising Neurological:Denies numbness, tingling or new weaknesses Behavioral/Psych: Mood is stable, no new changes  Breast: Palpable lump in the left breast All other systems were reviewed with the patient and are negative.  PHYSICAL EXAMINATION: ECOG PERFORMANCE STATUS: 1 - Symptomatic but completely ambulatory  Filed Vitals:   01/11/16 0921  BP: 115/50  Pulse: 78  Temp: 97.4 F (36.3 C)  Resp: 18   Filed Weights   01/11/16 0921  Weight: 147 lb 9.6 oz (66.951 kg)    GENERAL:alert, no distress and comfortable SKIN: skin color, texture, turgor are normal, no rashes or significant lesions EYES: normal, conjunctiva are pink and non-injected, sclera clear OROPHARYNX:no exudate, no erythema and lips, buccal mucosa, and tongue normal  NECK: supple, thyroid normal size, non-tender, without nodularity LYMPH:  no palpable lymphadenopathy in the cervical, axillary or inguinal LUNGS: clear to  auscultation and percussion with normal breathing effort HEART: regular rate & rhythm and no murmurs and no lower extremity edema ABDOMEN:abdomen soft, non-tender and normal bowel sounds Musculoskeletal:no cyanosis of digits and no clubbing  PSYCH: alert & oriented x 3 with fluent speech NEURO: no focal motor/sensory deficits BREAST:Left breast lump is palpable. No palpable axillary or supraclavicular lymphadenopathy (exam performed in the presence of a chaperone)   LABORATORY DATA:  I have reviewed the data as listed Lab Results  Component Value Date   WBC 3.6* 01/11/2016   HGB 10.8* 01/11/2016   HCT 31.7* 01/11/2016   MCV 81.3 01/11/2016   PLT 146 01/11/2016   Lab Results  Component Value Date   NA 134* 01/11/2016   K 4.2 01/11/2016   CL 98 01/06/2016   CO2 29 01/11/2016    RADIOGRAPHIC STUDIES: I have personally reviewed the radiological reports and agreed with the findings in the report.  ASSESSMENT AND PLAN:  Breast cancer of upper-outer quadrant of left female breast ([place]) Left breast biopsy 01/04/2016: Invasive ductal carcinoma with DCIS, grade 2, ER PR 0%, HER-2 negative ratio 1.32, Ki-67 80%, mammogram/ultrasound showed 1.7 cm tumor at 2:00 position, T1 cN0 stage IA clinical stage  Pathology and radiology counseling: Discussed with the patient, the details of pathology including the type of breast cancer,the clinical staging, the significance of ER, PR and HER-2/neu receptors and the implications for treatment. After reviewing the pathology in detail, we proceeded to discuss the different treatment options between surgery, radiation, chemotherapy.  Recommendation based on multidisciplinary tumor board: 1. Breast conserving surgery with sentinel lymph node biopsy 2. Adjuvant chemotherapy with Taxotere and Cytoxan every 3 weeks 4 cycles (avoiding Adriamycin because of CHF) 3. Followed by adjuvant radiation therapy  Chemotherapy counseling: I discussed risks and  benefits of chemotherapy including hair loss, cytopenias, risk of infection, anemia, taste changes, fatigue, liver and kidney function changes, long-term bone marrow damage as well as leukemia. Patient understands these risks and is willing to go through chemotherapy.  We will request a port placement during her surgery. Return to clinic after surgery to discuss the final pathology report and to finalize adjuvant treatment plan.    All questions were answered. The patient knows to  call the clinic with any problems, questions or concerns.    Rulon Eisenmenger, MD 01/11/2016

## 2016-01-11 NOTE — Telephone Encounter (Signed)
Spoke with patient to confirm chemo education appt 3/20 at 10 am

## 2016-01-12 ENCOUNTER — Other Ambulatory Visit (INDEPENDENT_AMBULATORY_CARE_PROVIDER_SITE_OTHER): Payer: Medicare Other

## 2016-01-12 DIAGNOSIS — R899 Unspecified abnormal finding in specimens from other organs, systems and tissues: Secondary | ICD-10-CM

## 2016-01-12 LAB — BASIC METABOLIC PANEL
BUN: 25 mg/dL — ABNORMAL HIGH (ref 6–23)
CHLORIDE: 98 meq/L (ref 96–112)
CO2: 30 meq/L (ref 19–32)
Calcium: 9.6 mg/dL (ref 8.4–10.5)
Creatinine, Ser: 1.18 mg/dL (ref 0.40–1.20)
GFR: 58.73 mL/min — ABNORMAL LOW (ref >60.00)
Glucose, Bld: 98 mg/dL (ref 70–99)
Potassium: 4.6 mEq/L (ref 3.5–5.1)
SODIUM: 133 meq/L — AB (ref 135–145)

## 2016-01-13 ENCOUNTER — Ambulatory Visit (HOSPITAL_COMMUNITY): Payer: Medicare Other

## 2016-01-16 ENCOUNTER — Ambulatory Visit (HOSPITAL_COMMUNITY): Payer: Medicare Other

## 2016-01-16 ENCOUNTER — Other Ambulatory Visit: Payer: Self-pay | Admitting: *Deleted

## 2016-01-16 ENCOUNTER — Ambulatory Visit: Payer: Medicare Other

## 2016-01-16 ENCOUNTER — Telehealth: Payer: Self-pay | Admitting: *Deleted

## 2016-01-16 NOTE — Telephone Encounter (Signed)
Spoke with patient today from [facility] 01/11/16. She is doing well with no complaints.  She was confused about the chemo class and thought it was a class on radiation therapy.  POF sent to reschedule patient.   Encouraged her to call with any needs or concerns.

## 2016-01-16 NOTE — Progress Notes (Unsigned)
Patient canceled class due to scheduling conflict.  She will reschedule after surgery.  Notified Dr Geralyn Flash nurse Jonelle Sports.

## 2016-01-17 ENCOUNTER — Telehealth: Payer: Self-pay | Admitting: Hematology and Oncology

## 2016-01-17 NOTE — Telephone Encounter (Signed)
Spoke with patient to confirm r/s chemo edu class for 3/24 at 930 am

## 2016-01-18 ENCOUNTER — Telehealth: Payer: Self-pay | Admitting: *Deleted

## 2016-01-18 ENCOUNTER — Ambulatory Visit (HOSPITAL_COMMUNITY): Payer: Medicare Other

## 2016-01-18 NOTE — Telephone Encounter (Signed)
No additional note

## 2016-01-19 ENCOUNTER — Other Ambulatory Visit: Payer: Medicare Other

## 2016-01-19 ENCOUNTER — Encounter: Payer: Self-pay | Admitting: Physician Assistant

## 2016-01-19 ENCOUNTER — Other Ambulatory Visit: Payer: Self-pay | Admitting: *Deleted

## 2016-01-19 ENCOUNTER — Telehealth: Payer: Self-pay | Admitting: Cardiology

## 2016-01-19 ENCOUNTER — Encounter: Payer: Self-pay | Admitting: *Deleted

## 2016-01-19 MED ORDER — CARVEDILOL 6.25 MG PO TABS: 18.3750 mg | ORAL_TABLET | Freq: Two times a day (BID) | ORAL | Status: DC

## 2016-01-19 NOTE — Telephone Encounter (Signed)
Penny James is calling to check on a Cardiac clearance that she fax over on 01/12/16. Please cll .  Thanks

## 2016-01-20 ENCOUNTER — Encounter: Payer: Self-pay | Admitting: Vascular Surgery

## 2016-01-20 ENCOUNTER — Ambulatory Visit (HOSPITAL_COMMUNITY): Payer: Medicare Other

## 2016-01-20 ENCOUNTER — Other Ambulatory Visit: Payer: Medicare Other

## 2016-01-20 NOTE — Telephone Encounter (Signed)
Late entry: I call [facility] yesterday and lvm for [facility] @ [facility] that I faxed over surgery clearance paperwork 3/223/17 on pt.

## 2016-01-23 ENCOUNTER — Ambulatory Visit (HOSPITAL_COMMUNITY): Payer: Medicare Other

## 2016-01-24 ENCOUNTER — Ambulatory Visit: Payer: Medicare Other | Admitting: Vascular Surgery

## 2016-01-25 ENCOUNTER — Ambulatory Visit (HOSPITAL_COMMUNITY): Payer: Medicare Other

## 2016-01-26 ENCOUNTER — Telehealth: Payer: Self-pay | Admitting: *Deleted

## 2016-01-26 ENCOUNTER — Encounter: Payer: Self-pay | Admitting: *Deleted

## 2016-01-26 ENCOUNTER — Encounter: Payer: Self-pay | Admitting: Family Medicine

## 2016-01-26 ENCOUNTER — Ambulatory Visit (INDEPENDENT_AMBULATORY_CARE_PROVIDER_SITE_OTHER): Payer: Medicare Other | Admitting: Family Medicine

## 2016-01-26 VITALS — BP 118/62 | HR 74 | Temp 98.2°F | Ht 62.0 in | Wt 148.5 lb

## 2016-01-26 DIAGNOSIS — J0100 Acute maxillary sinusitis, unspecified: Secondary | ICD-10-CM

## 2016-01-26 MED ORDER — DOXYCYCLINE HYCLATE 100 MG PO CAPS: 100.0000 mg | ORAL_CAPSULE | Freq: Two times a day (BID) | ORAL | Status: DC

## 2016-01-26 NOTE — Assessment & Plan Note (Signed)
Left breast biopsy 01/04/2016: Invasive ductal carcinoma with DCIS, grade 2, ER PR 0%, HER-2 negative ratio 1.32, Ki-67 80%, mammogram/ultrasound showed 1.7 cm tumor at 2:00 position, T1 cN0 stage IA clinical stage  Original plan was to do a breast conserving surgery followed by adjuvant chemotherapy. Because of her high risk cardiac issues where Plavix cannot be stopped, we elected to start with neoadjuvant chemotherapy.  Treatment plan: 1. Neoadjuvant chemotherapy with Taxotere and Cytoxan every 3 weeks 4 cycles ( not giving Adriamycin due to CHF and CAD) 2. Followed by breast conserving surgery 3. Followed by adjuvant radiation  We will plan to do chemotherapy through a peripheral vein because we cannot discontinue Plavix for port placement. Plan to start chemotherapy next week. Patient will need a chemotherapy class I reviewed the antiemetics protocol.  Return to clinic in 2 weeks for nadir count check.

## 2016-01-26 NOTE — Patient Instructions (Signed)
Please take the antibiotic as instructed   Please let us know if things are not improving or if you have any other concerns  Hang in there. You are in our thoughts and prayers.

## 2016-01-26 NOTE — Telephone Encounter (Signed)
No additional note

## 2016-01-26 NOTE — Progress Notes (Signed)
HPI:  Cough and nasal congestion: -started: 2 weeks ago -symptoms:nasal congestion, postnasal drip, productive cough, sinus congestion - now mucus has turned green -denies:fever, SOB, NVD, tooth pain, fevers -has tried: Nothing -sick contacts/travel/risks: no reported flu, strep or tick exposure -Hx of: Has a meeting next week to discuss treatment options for her breast cancer, or significant heart disease is an issue regarding surgical options  ROS: See pertinent positives and negatives per HPI.  Past Medical History  Diagnosis Date  . Hyperlipidemia   . Arthritis   . Anemia   . Chronic combined systolic and diastolic CHF (congestive heart failure) ([place])     a. EF 55% by cath 2010 but in 09/2015 found to be 20%, with mod-severe MR, grade 2 DD.  . Bipolar affective disorder ([place])   . GERD (gastroesophageal reflux disease)   . Obesity   . Peripheral neuropathy ([place])   . 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 CAD (coronary artery disease)     a. CABG in 2002. b. Cath 09/2015: poor targets, not a good surgical candidate, high risk PCI reserved for recurrent symptoms.  . Carotid artery stenosis     60-79% bilateral ICA stenosis s/p Right CEA 2013  followed by Dr. Donnetta Hutching  . Mitral regurgitation     a. Mod-severe by echo 09/2015.  . Ischemic cardiomyopathy   . CKD (chronic kidney disease), stage III   . Breast cancer [facility])     Past Surgical History  Procedure Laterality Date  . Abdominal hysterectomy  1984  . Sp pta renal / visc  artery  2003    with stent of left renal artery  . Tonsillectomy      AS A CHILD  . Endarterectomy  02/18/2012    rocedure: ENDARTERECTOMY CAROTID;  Surgeon: Rosetta Posner, MD;  Location: [facility] OR;  Service: Vascular;  Laterality: Right;  Right Carotid endarterectomy with Dacron patch angioplasty with resection  of internal carotid artery  . Coronary artery bypass graft      3 vessel coronary disease S?P CABG with widely patent grafts by cath in 2005 and distal disease past the graft insertion sites.  . Cardiac catheterization  03/25/09    Occluded SVG to intermediate and SVG to OM/posterior descending artery with PTCA left circ 6/10 s/p PTCA stent in SVG to OM/PDA, 07/2002 s/p PCI of left circ 03/2009  . Cardiac catheterization N/A 10/05/2015    Procedure: Left Heart Cath and Cors/Grafts Angiography;  Surgeon: Troy Sine, MD;  Location: [facility] LAB;  Service: Cardiovascular;  Laterality: N/A;    Family History  Problem Relation Age of Onset  . Heart disease Brother     Heart Disease before age 86  . Glaucoma Father   . Hypertension Father   . Stroke Paternal Grandmother   . Coronary artery disease Paternal Grandmother   . Cerebral aneurysm Brother   . Clotting disorder Sister     Social History   Social History  . Marital Status: Married    Spouse Name: N/A  . Number of Children: 3  . Years of Education: N/A   Occupational History  . DISABLED    Social History Main Topics  . Smoking status: Never Smoker   . Smokeless tobacco: Never Used  . Alcohol Use: No  . Drug Use: No  .  Sexual Activity: No   Other Topics Concern  . None   Social History Narrative     Current outpatient prescriptions:  .  ALDACTONE 25 MG tablet, Take 0.5 tablets (12.5 mg total) by mouth daily., Disp: 15 tablet, Rfl: 11 .  aspirin EC 81 MG tablet, Take 81 mg by mouth daily., Disp: , Rfl:  .  BAYER MICROLET LANCETS lancets, Use as instructed, Disp: 100 each, Rfl: 3 .  carvedilol (COREG) 6.25 MG tablet, Take 3 tablets (18.75 mg total) by mouth 2 (two) times daily with a meal., Disp: 180 tablet, Rfl: 11 .  CRESTOR 40 MG tablet, TAKE 1 TABLET BY MOUTH EVERY DAY, Disp: 30 tablet, Rfl: 11 .  ferrous sulfate 325 (65 FE) MG tablet, Take 325 mg by mouth daily with breakfast., Disp: , Rfl:  .  furosemide  (LASIX) 80 MG tablet, Take 1 tablet (80 mg total) by mouth every morning., Disp: 30 tablet, Rfl: 11 .  Glucose Blood (BAYER BREEZE 2 TEST) DISK, Test blood sugar four times a day, Disp: 200 each, Rfl: 5 .  isosorbide mononitrate (IMDUR) 30 MG 24 hr tablet, Take 1 tablet (30 mg total) by mouth daily. OK to use name brand Imdur if pt prefers., Disp: 30 tablet, Rfl: 11 .  lansoprazole (PREVACID) 15 MG capsule, Take 15 mg by mouth daily., Disp: , Rfl:  .  Multiple Vitamins-Minerals (MULTIVITAL) tablet, Take 1 tablet by mouth daily. , Disp: , Rfl:  .  nitroGLYCERIN (NITROSTAT) 0.4 MG SL tablet, Place 1 tablet (0.4 mg total) under the tongue every 5 (five) minutes as needed for chest pain., Disp: 25 tablet, Rfl: 2 .  olopatadine (PATANOL) 0.1 % ophthalmic solution, Place 1 drop into both eyes 2 (two) times daily as needed for allergies., Disp: , Rfl:  .  PLAVIX 75 MG tablet, TAKE 1 TABLET BY MOUTH EVERY DAY, Disp: 90 tablet, Rfl: 0 .  ZESTRIL 2.5 MG tablet, Take 2.5 mg by mouth daily., Disp: , Rfl: 11 .  ZETIA 10 MG tablet, TAKE 1 TABLET BY MOUTH EVERY DAY, Disp: 90 tablet, Rfl: 0 .  doxycycline (VIBRAMYCIN) 100 MG capsule, Take 1 capsule (100 mg total) by mouth 2 (two) times daily., Disp: 20 capsule, Rfl: 0  EXAM:  Filed Vitals:   01/26/16 0944  BP: 118/62  Pulse: 74  Temp: 98.2 F (36.8 C)    Body mass index is 27.15 kg/(m^2).  GENERAL: vitals reviewed and listed above, alert, oriented, appears well hydrated and in no acute distress  HEENT: atraumatic, conjunttiva clear, no obvious abnormalities on inspection of external nose and ears, normal appearance of ear canals and TMs, thick nasal congestion, mild post oropharyngeal erythema with PND, no tonsillar edema or exudate, no sinus TTP  NECK: no obvious masses on inspection  LUNGS: clear to auscultation bilaterally, no wheezes, rales or rhonchi, good air movement  CV: HRRR, no peripheral edema  MS: moves all extremities without  noticeable abnormality  PSYCH: pleasant and cooperative, no obvious depression or anxiety  ASSESSMENT AND PLAN:  Discussed the following assessment and plan:  Acute maxillary sinusitis, recurrence not specified   We discussed potential etiologies, with sinusitis being most likely given duration of symptoms and now thick discolored mucus. We discussed treatment side effects, likely course,  transmission, and signs of developing a worsening or serious illness. Opted to treat with doxycycline. -of course, we advised to return or notify a doctor immediately if symptoms worsen or persist or new concerns arise.  Patient Instructions  Please take the antibiotic as instructed   Please let us know if things are not improving or if you have any other concerns  Hang in there. You are in our thoughts and prayers.     Colin Benton R.

## 2016-01-26 NOTE — Progress Notes (Signed)
Pre visit review using our clinic review tool, if applicable. No additional management support is needed unless otherwise documented below in the visit note. 

## 2016-01-27 ENCOUNTER — Ambulatory Visit: Payer: Medicare Other | Admitting: Hematology and Oncology

## 2016-01-27 ENCOUNTER — Ambulatory Visit (HOSPITAL_COMMUNITY): Payer: Medicare Other

## 2016-01-27 ENCOUNTER — Telehealth: Payer: Self-pay | Admitting: *Deleted

## 2016-01-27 NOTE — Telephone Encounter (Signed)
Spoke with patient to follow up about her missed appt. Today.  She states she called and cancelled it.  Rescheduled appt for 4/4 at 1030am with Dr. Lindi Adie.  Offered to get her chemo class scheduled but she wants to wait until she sees Dr. Lindi Adie.

## 2016-01-30 ENCOUNTER — Ambulatory Visit (HOSPITAL_COMMUNITY): Payer: Medicare Other

## 2016-01-31 ENCOUNTER — Ambulatory Visit: Payer: Medicare Other | Admitting: Hematology and Oncology

## 2016-01-31 ENCOUNTER — Telehealth: Payer: Self-pay

## 2016-01-31 ENCOUNTER — Encounter: Payer: Self-pay | Admitting: *Deleted

## 2016-01-31 ENCOUNTER — Other Ambulatory Visit (HOSPITAL_COMMUNITY): Payer: Self-pay | Admitting: General Surgery

## 2016-01-31 ENCOUNTER — Other Ambulatory Visit: Payer: Self-pay | Admitting: Family Medicine

## 2016-01-31 DIAGNOSIS — C50412 Malignant neoplasm of upper-outer quadrant of left female breast: Secondary | ICD-10-CM

## 2016-01-31 NOTE — Assessment & Plan Note (Deleted)
Left breast biopsy 01/04/2016: Invasive ductal carcinoma with DCIS, grade 2, ER PR 0%, HER-2 negative ratio 1.32, Ki-67 80%, mammogram/ultrasound showed 1.7 cm tumor at 2:00 position, T1 cN0 stage IA clinical stage  Recommendation: 1. Neoadjuvant chemotherapy with Taxotere and Cytoxan every 3 weeks 4 cycles (avoiding Adriamycin because of CHF and CAD) 2. Follow-up with breast conserving surgery and sentinel lymph node biopsy 3. Follow-up adjuvant radiation therapy.  Our original plan was to do adjuvant chemotherapy but because of her cardiac issues with regards to Plavix which cannot be discontinued for surgery, we elected to do chemotherapy upfront. I discussed with the patient the benefits of chemotherapy tentatively equal with the treatments done before or after surgery. Dr.Tsui has requesting a port placement by interventional radiology.  Return to clinic to start chemotherapy.

## 2016-01-31 NOTE — Telephone Encounter (Signed)
LMOVM that I was calling concerned because pt missed her appointment today.  Pt also missed her appointment last week.  I advised patient to please call and let us know what her plans are; that it is ok if she has chosen to get treatment somewhere else we just want to make sure she is being taken care of.  Pt to call clinic to let us know if she needs to reschedule.

## 2016-02-01 ENCOUNTER — Ambulatory Visit (HOSPITAL_COMMUNITY): Payer: Medicare Other

## 2016-02-01 ENCOUNTER — Encounter: Payer: Self-pay | Admitting: *Deleted

## 2016-02-02 ENCOUNTER — Emergency Department (HOSPITAL_COMMUNITY): Payer: Medicare Other

## 2016-02-02 ENCOUNTER — Encounter (HOSPITAL_COMMUNITY): Payer: Self-pay | Admitting: *Deleted

## 2016-02-02 ENCOUNTER — Inpatient Hospital Stay (HOSPITAL_COMMUNITY)
Admission: EM | Admit: 2016-02-02 | Discharge: 2016-02-11 | DRG: 280 | Disposition: A | Discharge status: Hospice | Payer: Medicare Other | Attending: Internal Medicine | Admitting: Internal Medicine

## 2016-02-02 DIAGNOSIS — I5043 Acute on chronic combined systolic (congestive) and diastolic (congestive) heart failure: Secondary | ICD-10-CM | POA: Diagnosis present

## 2016-02-02 DIAGNOSIS — R0602 Shortness of breath: Secondary | ICD-10-CM | POA: Diagnosis present

## 2016-02-02 DIAGNOSIS — N183 Chronic kidney disease, stage 3 (moderate): Secondary | ICD-10-CM | POA: Diagnosis present

## 2016-02-02 DIAGNOSIS — R0600 Dyspnea, unspecified: Secondary | ICD-10-CM | POA: Diagnosis not present

## 2016-02-02 DIAGNOSIS — E1142 Type 2 diabetes mellitus with diabetic polyneuropathy: Secondary | ICD-10-CM | POA: Diagnosis present

## 2016-02-02 DIAGNOSIS — E876 Hypokalemia: Secondary | ICD-10-CM | POA: Diagnosis not present

## 2016-02-02 DIAGNOSIS — Z7189 Other specified counseling: Secondary | ICD-10-CM | POA: Diagnosis not present

## 2016-02-02 DIAGNOSIS — J9601 Acute respiratory failure with hypoxia: Secondary | ICD-10-CM | POA: Diagnosis present

## 2016-02-02 DIAGNOSIS — Z7982 Long term (current) use of aspirin: Secondary | ICD-10-CM

## 2016-02-02 DIAGNOSIS — Z888 Allergy status to other drugs, medicaments and biological substances status: Secondary | ICD-10-CM | POA: Diagnosis not present

## 2016-02-02 DIAGNOSIS — G9341 Metabolic encephalopathy: Secondary | ICD-10-CM | POA: Diagnosis present

## 2016-02-02 DIAGNOSIS — R079 Chest pain, unspecified: Secondary | ICD-10-CM

## 2016-02-02 DIAGNOSIS — N390 Urinary tract infection, site not specified: Secondary | ICD-10-CM | POA: Diagnosis present

## 2016-02-02 DIAGNOSIS — J9602 Acute respiratory failure with hypercapnia: Secondary | ICD-10-CM | POA: Diagnosis present

## 2016-02-02 DIAGNOSIS — Z978 Presence of other specified devices: Secondary | ICD-10-CM | POA: Insufficient documentation

## 2016-02-02 DIAGNOSIS — Z789 Other specified health status: Secondary | ICD-10-CM | POA: Diagnosis not present

## 2016-02-02 DIAGNOSIS — I214 Non-ST elevation (NSTEMI) myocardial infarction: Secondary | ICD-10-CM | POA: Diagnosis present

## 2016-02-02 DIAGNOSIS — C50412 Malignant neoplasm of upper-outer quadrant of left female breast: Secondary | ICD-10-CM | POA: Diagnosis present

## 2016-02-02 DIAGNOSIS — E669 Obesity, unspecified: Secondary | ICD-10-CM | POA: Diagnosis present

## 2016-02-02 DIAGNOSIS — F319 Bipolar disorder, unspecified: Secondary | ICD-10-CM | POA: Diagnosis present

## 2016-02-02 DIAGNOSIS — I255 Ischemic cardiomyopathy: Secondary | ICD-10-CM | POA: Diagnosis present

## 2016-02-02 DIAGNOSIS — R7989 Other specified abnormal findings of blood chemistry: Secondary | ICD-10-CM

## 2016-02-02 DIAGNOSIS — I340 Nonrheumatic mitral (valve) insufficiency: Secondary | ICD-10-CM | POA: Diagnosis present

## 2016-02-02 DIAGNOSIS — I5023 Acute on chronic systolic (congestive) heart failure: Secondary | ICD-10-CM | POA: Insufficient documentation

## 2016-02-02 DIAGNOSIS — Z66 Do not resuscitate: Secondary | ICD-10-CM | POA: Insufficient documentation

## 2016-02-02 DIAGNOSIS — E1122 Type 2 diabetes mellitus with diabetic chronic kidney disease: Secondary | ICD-10-CM | POA: Diagnosis present

## 2016-02-02 DIAGNOSIS — Z9119 Patient's noncompliance with other medical treatment and regimen: Secondary | ICD-10-CM

## 2016-02-02 DIAGNOSIS — R05 Cough: Secondary | ICD-10-CM | POA: Diagnosis not present

## 2016-02-02 DIAGNOSIS — E871 Hypo-osmolality and hyponatremia: Secondary | ICD-10-CM | POA: Diagnosis present

## 2016-02-02 DIAGNOSIS — R310 Gross hematuria: Secondary | ICD-10-CM | POA: Diagnosis not present

## 2016-02-02 DIAGNOSIS — E873 Alkalosis: Secondary | ICD-10-CM | POA: Diagnosis present

## 2016-02-02 DIAGNOSIS — Z880 Allergy status to penicillin: Secondary | ICD-10-CM | POA: Diagnosis not present

## 2016-02-02 DIAGNOSIS — N179 Acute kidney failure, unspecified: Secondary | ICD-10-CM | POA: Diagnosis present

## 2016-02-02 DIAGNOSIS — Z951 Presence of aortocoronary bypass graft: Secondary | ICD-10-CM | POA: Diagnosis not present

## 2016-02-02 DIAGNOSIS — I447 Left bundle-branch block, unspecified: Secondary | ICD-10-CM | POA: Diagnosis present

## 2016-02-02 DIAGNOSIS — J9600 Acute respiratory failure, unspecified whether with hypoxia or hypercapnia: Secondary | ICD-10-CM | POA: Diagnosis present

## 2016-02-02 DIAGNOSIS — I209 Angina pectoris, unspecified: Secondary | ICD-10-CM | POA: Diagnosis not present

## 2016-02-02 DIAGNOSIS — R059 Cough, unspecified: Secondary | ICD-10-CM | POA: Insufficient documentation

## 2016-02-02 DIAGNOSIS — Z515 Encounter for palliative care: Secondary | ICD-10-CM | POA: Insufficient documentation

## 2016-02-02 DIAGNOSIS — I130 Hypertensive heart and chronic kidney disease with heart failure and stage 1 through stage 4 chronic kidney disease, or unspecified chronic kidney disease: Secondary | ICD-10-CM | POA: Diagnosis present

## 2016-02-02 DIAGNOSIS — J810 Acute pulmonary edema: Secondary | ICD-10-CM | POA: Insufficient documentation

## 2016-02-02 DIAGNOSIS — Z6825 Body mass index (BMI) 25.0-25.9, adult: Secondary | ICD-10-CM

## 2016-02-02 DIAGNOSIS — M1990 Unspecified osteoarthritis, unspecified site: Secondary | ICD-10-CM | POA: Diagnosis present

## 2016-02-02 DIAGNOSIS — I5021 Acute systolic (congestive) heart failure: Secondary | ICD-10-CM | POA: Diagnosis not present

## 2016-02-02 DIAGNOSIS — I6529 Occlusion and stenosis of unspecified carotid artery: Secondary | ICD-10-CM | POA: Diagnosis present

## 2016-02-02 DIAGNOSIS — E1165 Type 2 diabetes mellitus with hyperglycemia: Secondary | ICD-10-CM | POA: Diagnosis present

## 2016-02-02 DIAGNOSIS — I472 Ventricular tachycardia, unspecified: Secondary | ICD-10-CM

## 2016-02-02 DIAGNOSIS — Z7902 Long term (current) use of antithrombotics/antiplatelets: Secondary | ICD-10-CM

## 2016-02-02 DIAGNOSIS — I501 Left ventricular failure: Secondary | ICD-10-CM | POA: Diagnosis not present

## 2016-02-02 DIAGNOSIS — Z79899 Other long term (current) drug therapy: Secondary | ICD-10-CM | POA: Diagnosis not present

## 2016-02-02 DIAGNOSIS — K5901 Slow transit constipation: Secondary | ICD-10-CM | POA: Insufficient documentation

## 2016-02-02 DIAGNOSIS — I509 Heart failure, unspecified: Secondary | ICD-10-CM

## 2016-02-02 DIAGNOSIS — N189 Chronic kidney disease, unspecified: Secondary | ICD-10-CM | POA: Insufficient documentation

## 2016-02-02 DIAGNOSIS — I10 Essential (primary) hypertension: Secondary | ICD-10-CM | POA: Diagnosis present

## 2016-02-02 DIAGNOSIS — K219 Gastro-esophageal reflux disease without esophagitis: Secondary | ICD-10-CM | POA: Diagnosis present

## 2016-02-02 DIAGNOSIS — E782 Mixed hyperlipidemia: Secondary | ICD-10-CM | POA: Diagnosis not present

## 2016-02-02 DIAGNOSIS — I25111 Atherosclerotic heart disease of native coronary artery with angina pectoris with documented spasm: Secondary | ICD-10-CM | POA: Diagnosis not present

## 2016-02-02 DIAGNOSIS — I2510 Atherosclerotic heart disease of native coronary artery without angina pectoris: Secondary | ICD-10-CM | POA: Diagnosis present

## 2016-02-02 DIAGNOSIS — R778 Other specified abnormalities of plasma proteins: Secondary | ICD-10-CM

## 2016-02-02 DIAGNOSIS — R0603 Acute respiratory distress: Secondary | ICD-10-CM

## 2016-02-02 HISTORY — DX: Other specified abnormal findings of blood chemistry: R79.89

## 2016-02-02 HISTORY — DX: Ventricular tachycardia: I47.2

## 2016-02-02 LAB — I-STAT ARTERIAL BLOOD GAS, ED
Acid-base deficit: 5 mmol/L — ABNORMAL HIGH (ref 0.0–2.0)
Acid-base deficit: 6 mmol/L — ABNORMAL HIGH (ref 0.0–2.0)
Acid-base deficit: 2 mmol/L (ref 0.0–2.0)
BICARBONATE: 22.1 meq/L (ref 20.0–24.0)
Bicarbonate: 22.9 mEq/L (ref 20.0–24.0)
Bicarbonate: 24.8 mEq/L — ABNORMAL HIGH (ref 20.0–24.0)
O2 Saturation: 94 %
O2 Saturation: 98 %
O2 Saturation: 100 %
PCO2 ART: 48.8 mmHg — AB (ref 35.0–45.0)
PO2 ART: 80 mmHg (ref 80.0–100.0)
Patient temperature: 98.6
TCO2: 24 mmol/L (ref 0–100)
TCO2: 25 mmol/L (ref 0–100)
TCO2: 26 mmol/L (ref 0–100)
pCO2 arterial: 56.3 mmHg — ABNORMAL HIGH (ref 35.0–45.0)
pCO2 arterial: 50.7 mmHg — ABNORMAL HIGH (ref 35.0–45.0)
pH, Arterial: 7.263 — ABNORMAL LOW (ref 7.350–7.450)
pH, Arterial: 7.218 — ABNORMAL LOW (ref 7.350–7.450)
pH, Arterial: 7.298 — ABNORMAL LOW (ref 7.350–7.450)
pO2, Arterial: 138 mmHg — ABNORMAL HIGH (ref 80.0–100.0)
pO2, Arterial: 326 mmHg — ABNORMAL HIGH (ref 80.0–100.0)

## 2016-02-02 LAB — OSMOLALITY: Osmolality: 272 mOsm/kg — ABNORMAL LOW (ref 275–295)

## 2016-02-02 LAB — CBC
HCT: 31.6 % — ABNORMAL LOW (ref 36.0–46.0)
HCT: 31.1 % — ABNORMAL LOW (ref 36.0–46.0)
HEMOGLOBIN: 10.9 g/dL — AB (ref 12.0–15.0)
Hemoglobin: 10.9 g/dL — ABNORMAL LOW (ref 12.0–15.0)
MCH: 27.7 pg (ref 26.0–34.0)
MCH: 28.3 pg (ref 26.0–34.0)
MCHC: 34.5 g/dL (ref 30.0–36.0)
MCHC: 35 g/dL (ref 30.0–36.0)
MCV: 80.4 fL (ref 78.0–100.0)
MCV: 80.8 fL (ref 78.0–100.0)
PLATELETS: 100 10*3/uL — AB (ref 150–400)
Platelets: 135 10*3/uL — ABNORMAL LOW (ref 150–400)
RBC: 3.93 MIL/uL (ref 3.87–5.11)
RBC: 3.85 MIL/uL — ABNORMAL LOW (ref 3.87–5.11)
RDW: 15.3 % (ref 11.5–15.5)
RDW: 15 % (ref 11.5–15.5)
WBC: 3.7 10*3/uL — ABNORMAL LOW (ref 4.0–10.5)
WBC: 8.8 10*3/uL (ref 4.0–10.5)

## 2016-02-02 LAB — GLUCOSE, CAPILLARY
GLUCOSE-CAPILLARY: 111 mg/dL — AB (ref 65–99)
Glucose-Capillary: 144 mg/dL — ABNORMAL HIGH (ref 65–99)
Glucose-Capillary: 95 mg/dL (ref 65–99)

## 2016-02-02 LAB — HEPATIC FUNCTION PANEL
ALBUMIN: 4.2 g/dL (ref 3.5–5.0)
ALT: 22 U/L (ref 14–54)
AST: 28 U/L (ref 15–41)
Alkaline Phosphatase: 56 U/L (ref 38–126)
BILIRUBIN DIRECT: 0.2 mg/dL (ref 0.1–0.5)
Indirect Bilirubin: 0.7 mg/dL (ref 0.3–0.9)
TOTAL PROTEIN: 6.7 g/dL (ref 6.5–8.1)
Total Bilirubin: 0.9 mg/dL (ref 0.3–1.2)

## 2016-02-02 LAB — BASIC METABOLIC PANEL
ANION GAP: 12 (ref 5–15)
BUN: 23 mg/dL — ABNORMAL HIGH (ref 6–20)
CALCIUM: 9.4 mg/dL (ref 8.9–10.3)
CO2: 23 mmol/L (ref 22–32)
CREATININE: 1.37 mg/dL — AB (ref 0.44–1.00)
Chloride: 90 mmol/L — ABNORMAL LOW (ref 101–111)
GFR calc Af Amer: 45 mL/min — ABNORMAL LOW (ref >60)
GFR calc non Af Amer: 39 mL/min — ABNORMAL LOW (ref >60)
GLUCOSE: 191 mg/dL — AB (ref 65–99)
Potassium: 4.6 mmol/L (ref 3.5–5.1)
Sodium: 125 mmol/L — ABNORMAL LOW (ref 135–145)

## 2016-02-02 LAB — MAGNESIUM: Magnesium: 2.2 mg/dL (ref 1.7–2.4)

## 2016-02-02 LAB — PROTIME-INR
INR: 1.19 (ref 0.00–1.49)
Prothrombin Time: 15.3 seconds — ABNORMAL HIGH (ref 11.6–15.2)

## 2016-02-02 LAB — I-STAT TROPONIN, ED: TROPONIN I, POC: 0.02 ng/mL (ref 0.00–0.08)

## 2016-02-02 LAB — PHOSPHORUS: Phosphorus: 5.4 mg/dL — ABNORMAL HIGH (ref 2.5–4.6)

## 2016-02-02 LAB — BRAIN NATRIURETIC PEPTIDE: B Natriuretic Peptide: 1635.8 pg/mL — ABNORMAL HIGH (ref 0.0–100.0)

## 2016-02-02 LAB — MRSA PCR SCREENING: MRSA by PCR: NEGATIVE

## 2016-02-02 LAB — CREATININE, SERUM
CREATININE: 1.73 mg/dL — AB (ref 0.44–1.00)
GFR, EST AFRICAN AMERICAN: 34 mL/min — AB (ref >60)
GFR, EST NON AFRICAN AMERICAN: 29 mL/min — AB (ref >60)

## 2016-02-02 LAB — TROPONIN I: Troponin I: 0.14 ng/mL — ABNORMAL HIGH (ref <0.031)

## 2016-02-02 MED ORDER — MIDAZOLAM HCL 2 MG/2ML IJ SOLN
1.0000 mg | INTRAMUSCULAR | Status: DC | PRN
  Administered 2016-02-06: 1 mg via INTRAVENOUS
  Filled 2016-02-02 (×2): qty 2

## 2016-02-02 MED ORDER — ALBUTEROL SULFATE (2.5 MG/3ML) 0.083% IN NEBU: 2.5000 mg | INHALATION_SOLUTION | RESPIRATORY_TRACT | Status: DC | PRN

## 2016-02-02 MED ORDER — NITROGLYCERIN IN D5W 200-5 MCG/ML-% IV SOLN
5.0000 ug/min | INTRAVENOUS | Status: DC
  Administered 2016-02-02 – 2016-02-03 (×2): 5 ug/min via INTRAVENOUS
  Administered 2016-02-05: 20 ug/min via INTRAVENOUS
  Administered 2016-02-06: 33.333 ug/min via INTRAVENOUS
  Administered 2016-02-06: 46.667 ug/min via INTRAVENOUS
  Administered 2016-02-07: 50 ug/min via INTRAVENOUS
  Administered 2016-02-08: 10 ug/min via INTRAVENOUS
  Administered 2016-02-08: 15 ug/min via INTRAVENOUS
  Filled 2016-02-02 (×6): qty 250

## 2016-02-02 MED ORDER — SODIUM CHLORIDE 0.9 % IV SOLN
INTRAVENOUS | Status: DC
  Administered 2016-02-02: 12:00:00 via INTRAVENOUS
  Administered 2016-02-04: 10 mL/h via INTRAVENOUS
  Administered 2016-02-08: 15:00:00 via INTRAVENOUS

## 2016-02-02 MED ORDER — PROPOFOL 1000 MG/100ML IV EMUL
INTRAVENOUS | Status: AC
  Administered 2016-02-02: 10 ug/kg/min via INTRAVENOUS
  Filled 2016-02-02: qty 100

## 2016-02-02 MED ORDER — ASPIRIN 81 MG PO CHEW
81.0000 mg | CHEWABLE_TABLET | Freq: Every day | ORAL | Status: DC
  Administered 2016-02-03 – 2016-02-11 (×9): 81 mg
  Filled 2016-02-02 (×10): qty 1

## 2016-02-02 MED ORDER — CLOPIDOGREL BISULFATE 75 MG PO TABS
75.0000 mg | ORAL_TABLET | Freq: Every day | ORAL | Status: DC
  Administered 2016-02-03 – 2016-02-11 (×9): 75 mg
  Filled 2016-02-02 (×9): qty 1

## 2016-02-02 MED ORDER — PROPOFOL 1000 MG/100ML IV EMUL
5.0000 ug/kg/min | INTRAVENOUS | Status: DC
  Administered 2016-02-02: 5 ug/kg/min via INTRAVENOUS
  Administered 2016-02-02: 10 ug/kg/min via INTRAVENOUS
  Filled 2016-02-02: qty 100

## 2016-02-02 MED ORDER — MORPHINE SULFATE (PF) 2 MG/ML IV SOLN
2.0000 mg | Freq: Once | INTRAVENOUS | Status: AC
  Administered 2016-02-02: 2 mg via INTRAVENOUS
  Filled 2016-02-02: qty 1

## 2016-02-02 MED ORDER — FENTANYL BOLUS VIA INFUSION
25.0000 ug | INTRAVENOUS | Status: DC | PRN
  Administered 2016-02-03 (×2): 25 ug via INTRAVENOUS
  Filled 2016-02-02: qty 25

## 2016-02-02 MED ORDER — SODIUM CHLORIDE 0.9 % IV SOLN
25.0000 ug/h | INTRAVENOUS | Status: DC
  Administered 2016-02-02 – 2016-02-05 (×3): 50 ug/h via INTRAVENOUS
  Filled 2016-02-02 (×3): qty 50

## 2016-02-02 MED ORDER — POTASSIUM CHLORIDE CRYS ER 20 MEQ PO TBCR: 40.0000 meq | EXTENDED_RELEASE_TABLET | Freq: Once | ORAL | Status: DC

## 2016-02-02 MED ORDER — PROPOFOL 10 MG/ML IV BOLUS
INTRAVENOUS | Status: AC | PRN
  Administered 2016-02-02: 20 mg via INTRAVENOUS

## 2016-02-02 MED ORDER — FENTANYL CITRATE (PF) 100 MCG/2ML IJ SOLN
50.0000 ug | Freq: Once | INTRAMUSCULAR | Status: AC
  Administered 2016-02-02: 50 ug via INTRAVENOUS

## 2016-02-02 MED ORDER — ETOMIDATE 2 MG/ML IV SOLN
INTRAVENOUS | Status: AC | PRN
  Administered 2016-02-02: 20 mg via INTRAVENOUS

## 2016-02-02 MED ORDER — FUROSEMIDE 10 MG/ML IJ SOLN: 100.0000 mg | Freq: Once | INTRAVENOUS | Status: DC

## 2016-02-02 MED ORDER — HEPARIN SODIUM (PORCINE) 5000 UNIT/ML IJ SOLN
5000.0000 [IU] | Freq: Three times a day (TID) | INTRAMUSCULAR | Status: DC
  Administered 2016-02-02 (×2): 5000 [IU] via SUBCUTANEOUS
  Filled 2016-02-02 (×3): qty 1

## 2016-02-02 MED ORDER — PANTOPRAZOLE SODIUM 40 MG IV SOLR
40.0000 mg | Freq: Every day | INTRAVENOUS | Status: DC
  Administered 2016-02-02 – 2016-02-03 (×2): 40 mg via INTRAVENOUS
  Filled 2016-02-02 (×2): qty 40

## 2016-02-02 MED ORDER — CHLORHEXIDINE GLUCONATE 0.12% ORAL RINSE (MEDLINE KIT)
15.0000 mL | Freq: Two times a day (BID) | OROMUCOSAL | Status: DC
  Administered 2016-02-02 – 2016-02-06 (×8): 15 mL via OROMUCOSAL

## 2016-02-02 MED ORDER — INSULIN ASPART 100 UNIT/ML ~~LOC~~ SOLN
0.0000 [IU] | SUBCUTANEOUS | Status: DC
  Administered 2016-02-04: 3 [IU] via SUBCUTANEOUS
  Administered 2016-02-04 – 2016-02-05 (×3): 2 [IU] via SUBCUTANEOUS
  Administered 2016-02-05: 3 [IU] via SUBCUTANEOUS
  Administered 2016-02-05 – 2016-02-06 (×4): 2 [IU] via SUBCUTANEOUS
  Administered 2016-02-06: 3 [IU] via SUBCUTANEOUS
  Administered 2016-02-06: 2 [IU] via SUBCUTANEOUS
  Administered 2016-02-06: 3 [IU] via SUBCUTANEOUS

## 2016-02-02 MED ORDER — ANTISEPTIC ORAL RINSE SOLUTION (CORINZ)
7.0000 mL | Freq: Four times a day (QID) | OROMUCOSAL | Status: DC
Start: 2016-02-03 — End: 2016-02-06
  Administered 2016-02-03 – 2016-02-06 (×16): 7 mL via OROMUCOSAL

## 2016-02-02 MED ORDER — MIDAZOLAM HCL 2 MG/2ML IJ SOLN
1.0000 mg | INTRAMUSCULAR | Status: DC | PRN
  Administered 2016-02-02: 1 mg via INTRAVENOUS

## 2016-02-02 MED ORDER — POTASSIUM CHLORIDE CRYS ER 20 MEQ PO TBCR: 20.0000 meq | EXTENDED_RELEASE_TABLET | Freq: Once | ORAL | Status: DC

## 2016-02-02 MED ORDER — PROPOFOL 500 MG/50ML IV EMUL
INTRAVENOUS | Status: AC | PRN
  Administered 2016-02-02: 1 ug/kg/min via INTRAVENOUS

## 2016-02-02 MED ORDER — SUCCINYLCHOLINE CHLORIDE 20 MG/ML IJ SOLN
INTRAMUSCULAR | Status: AC | PRN
  Administered 2016-02-02: 100 mg via INTRAVENOUS

## 2016-02-02 MED ORDER — LORAZEPAM 2 MG/ML IJ SOLN
0.5000 mg | Freq: Once | INTRAMUSCULAR | Status: AC
  Administered 2016-02-02: 0.5 mg via INTRAVENOUS
  Filled 2016-02-02: qty 1

## 2016-02-02 MED ORDER — FUROSEMIDE 10 MG/ML IJ SOLN
100.0000 mg | Freq: Once | INTRAVENOUS | Status: AC
  Administered 2016-02-02: 100 mg via INTRAVENOUS
  Filled 2016-02-02: qty 10

## 2016-02-02 NOTE — Progress Notes (Signed)
ABG obtained on patient on 6L nasal cannula due to patient not being able to tolerate bipap.  Results were given to MD and patient agreed to try bipap one more time.  Patient was placed back on bipap.  Will continue to monitor.    Ref. Range 02/02/2016 09:15  Sample type Unknown ARTERIAL  pH, Arterial Latest Ref Range: 7.350-7.450  7.263 (L)  pCO2 arterial Latest Ref Range: 35.0-45.0 mmHg 48.8 (H)  pO2, Arterial Latest Ref Range: 80.0-100.0 mmHg 80.0  Bicarbonate Latest Ref Range: 20.0-24.0 mEq/L 22.1  TCO2 Latest Ref Range: 0-100 mmol/L 24  Acid-base deficit Latest Ref Range: 0.0-2.0 mmol/L 5.0 (H)  O2 Saturation Latest Units: % 94.0  Patient temperature Unknown 98.6 F  Collection site Unknown RADIAL, ALLEN'S T.Marland KitchenMarland Kitchen

## 2016-02-02 NOTE — ED Notes (Signed)
Spoke with pt son, Charlotte Crumb, on phone with pt permission.  Son was updated and will come see pt.

## 2016-02-02 NOTE — ED Notes (Signed)
Spoke with main lab who reports they will be able to add on additional labs.

## 2016-02-02 NOTE — ED Notes (Signed)
Pt to ED from home c/o centralized chest heaviness onset yesterday. Pain radiated down L arm, one episode of NV, reports shortness of breath. EMS given 324mg  ASA and nitro x 1. Hx of MI and angina

## 2016-02-02 NOTE — ED Notes (Signed)
Ray MD at bedside 

## 2016-02-02 NOTE — ED Notes (Signed)
Attempted report 

## 2016-02-02 NOTE — Sedation Documentation (Signed)
MD at bedside. 

## 2016-02-02 NOTE — ED Notes (Signed)
Pt agreed to BPAP and BPAP placed by respiratory

## 2016-02-02 NOTE — Consult Note (Signed)
CARDIOLOGY CONSULT NOTE   Patient ID: Penny James MRN: [id number] DOB/AGE: 05-26-1951 65 y.o.  Admit date: 02/02/2016  Primary Physician   Lucretia Kern., DO Primary Cardiologist   Dr. Fransico Him  Reason for Consultation   CP and CHF Requesting Physician  Dr. Halford Chessman  HPI: Penny James is a 65 y.o. female with a history of hx of CAD s/p CABG, HTN, diabetes, ischemic cardiomyopathy, combined systolic and diastolic CHF, Recent dx of breast cancer (plan to start chemo) moderate to severe mitral regurgitation, PAD status post prior renal artery stenting, HL, carotid stenosis status post R CEA, CKD, bipolar affective d/o who presented to [facility] ED for evaluation of SOB and chest pain.   Admitted 12/16 with a non-STEMI. LHC demonstrated occluded vein grafts to the diagonal, ramus and distal LCx. LIMA-LAD was patent. She was not felt to be a surgical candidate. High risk left main PCI with an assist device was felt to be an option. Patient opted for medical therapy.  Admitted 3/2-3/3 with acute on chronic combined systolic and diastolic CHF. Cardiac enzymes remained negative. She was diuresed with IV Lasix. Isosorbide was added to her medical regimen. She had FU carotid US recently and LICA [id number]/u with  Dr. Donnetta Hutching. ? If has followed up of not.   The patient is intubated and no family at bed side. History obtained from nurse and reviewing of chart. Last night patient developed sob (more than CP). Unable to sleep, orthopnea, and cough with sputum. Leading to ER presentation this morning. SOB worsen even though she was on BiPAP and intubated.    EKG showed sinus rhythm at 79 bpm, prolonged PR interval, LVH with repolarization. No acute ST changes. POC troponin 0.02. BNP of 1635. CXR showed mild pulmonary edema, improved from prior.   Seen by PCP 01/26/16 for 2 weeks hx of cough and nasal congesion and dx with Acute maxillary sinusitis  Past Medical History  Diagnosis Date  .  Hyperlipidemia   . Arthritis   . Anemia   . Chronic combined systolic and diastolic CHF (congestive heart failure) ([place])     a. EF 55% by cath 2010 but in 09/2015 found to be 20%, with mod-severe MR, grade 2 DD.  . Bipolar affective disorder ([place])   . GERD (gastroesophageal reflux disease)   . Obesity   . Peripheral neuropathy ([place])   . 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 CAD (coronary artery disease)     a. CABG in 2002. b. Cath 09/2015: poor targets, not a good surgical candidate, high risk PCI reserved for recurrent symptoms.  . Carotid artery stenosis     60-79% bilateral ICA stenosis s/p Right CEA 2013  followed by Dr. Donnetta Hutching  . Mitral regurgitation     a. Mod-severe by echo 09/2015.  . Ischemic cardiomyopathy   . CKD (chronic kidney disease), stage III   . Breast cancer [facility])      Past Surgical History  Procedure Laterality Date  . Abdominal hysterectomy  1984  . Sp pta renal / visc  artery  2003    with stent of left renal artery  . Tonsillectomy      AS A CHILD  . Endarterectomy  02/18/2012    rocedure: ENDARTERECTOMY CAROTID;  Surgeon: Rosetta Posner, MD;  Location: [place];  Service: Vascular;  Laterality: Right;  Right Carotid endarterectomy with Dacron patch angioplasty with resection of internal carotid artery  . Coronary artery bypass graft      3 vessel coronary disease S?P CABG with widely patent grafts by cath in 2005 and distal disease past the graft insertion sites.  . Cardiac catheterization  03/25/09    Occluded SVG to intermediate and SVG to OM/posterior descending artery with PTCA left circ 6/10 s/p PTCA stent in SVG to OM/PDA, 07/2002 s/p PCI of left circ 03/2009  . Cardiac catheterization N/A 10/05/2015    Procedure: Left Heart Cath and Cors/Grafts Angiography;  Surgeon: Troy Sine, MD;  Location: [facility];  Service: Cardiovascular;  Laterality: N/A;    Allergies  Allergen Reactions  . Penicillins Rash    Has patient had a PCN reaction causing immediate rash, facial/tongue/throat swelling, SOB or lightheadedness with hypotension: Yes Has patient had a PCN reaction causing severe rash involving mucus membranes or skin necrosis: Yes Has patient had a PCN reaction that required hospitalization Yes- admitted to hospital Has patient had a PCN reaction occurring within the last 10 years: No, childhood allergy If all of the above answers are "NO", then may proceed with Cephalosporin use.   . Protamine     Severe hypotension Internal bleeding around heart  . Amlodipine Swelling    Causes swelling in higher dosage    I have reviewed the patient's current medications . aspirin  81 mg Per Tube Daily  . clopidogrel  75 mg Per Tube Daily  . heparin  5,000 Units Subcutaneous 3 times per day  . insulin aspart  0-15 Units Subcutaneous 6 times per day  . pantoprazole (PROTONIX) IV  40 mg Intravenous QHS  . potassium chloride  20 mEq Oral Once  . potassium chloride  40 mEq Oral Once   . sodium chloride 10 mL/hr at 02/02/16 1201  . fentaNYL infusion INTRAVENOUS 50 mcg/hr (02/02/16 1225)  . nitroGLYCERIN Stopped (02/02/16 1253)   albuterol, fentaNYL, midazolam, midazolam  Prior to Admission medications   Medication Sig Start Date End Date Taking? Authorizing Provider  ALDACTONE 25 MG tablet Take 0.5 tablets (12.5 mg total) by mouth daily. 12/15/15  Yes Sueanne Margarita, MD  aspirin EC 81 MG tablet Take 81 mg by mouth daily.   Yes Historical Provider, MD  carvedilol (COREG) 6.25 MG tablet Take 3 tablets (18.75 mg total) by mouth 2 (two) times daily with a meal. 01/19/16  Yes Traci R Turner, MD  CRESTOR 40 MG tablet TAKE 1 TABLET BY MOUTH EVERY DAY 09/14/15  Yes Sueanne Margarita, MD  ferrous sulfate 325 (65 FE) MG tablet Take 325 mg by mouth daily with breakfast.   Yes Historical Provider, MD    furosemide (LASIX) 80 MG tablet Take 1 tablet (80 mg total) by mouth every morning. 10/10/15  Yes Luke K Kilroy, PA-C  lansoprazole (PREVACID) 15 MG capsule Take 15 mg by mouth daily.   Yes Historical Provider, MD  Multiple Vitamins-Minerals (MULTIVITAL) tablet Take 1 tablet by mouth daily.    Yes Historical Provider, MD  nitroGLYCERIN (NITROSTAT) 0.4 MG SL tablet Place 1 tablet (0.4 mg total) under the tongue every 5 (five) minutes as needed for chest pain. 10/10/15  Yes Luke K Kilroy, PA-C  olopatadine (PATANOL) 0.1 % ophthalmic solution Place 1 drop into both eyes 2 (two) times daily as needed for allergies.   Yes Historical Provider, MD  PLAVIX 75 MG tablet TAKE 1 TABLET BY MOUTH  EVERY DAY 12/19/15  Yes Sueanne Margarita, MD  ZESTRIL 2.5 MG tablet Take 2.5 mg by mouth daily. 11/20/15  Yes Historical Provider, MD  ZETIA 10 MG tablet TAKE 1 TABLET BY MOUTH EVERY DAY 11/15/15  Yes Sueanne Margarita, MD  BAYER MICROLET LANCETS lancets Use as instructed 08/19/15   Lucretia Kern, DO  doxycycline (VIBRAMYCIN) 100 MG capsule Take 1 capsule (100 mg total) by mouth 2 (two) times daily. Patient not taking: Reported on 02/02/2016 01/26/16   Lucretia Kern, DO  Glucose Blood (BAYER BREEZE 2 TEST) DISK Test blood sugar four times a day 08/19/15   Lucretia Kern, DO  isosorbide mononitrate (IMDUR) 30 MG 24 hr tablet Take 1 tablet (30 mg total) by mouth daily. OK to use name brand Imdur if pt prefers. Patient not taking: Reported on 02/02/2016 12/30/15   Lonn Georgia, PA-C     Social History   Social History  . Marital Status: Married    Spouse Name: N/A  . Number of Children: 3  . Years of Education: N/A   Occupational History  . DISABLED    Social History Main Topics  . Smoking status: Never Smoker   . Smokeless tobacco: Never Used  . Alcohol Use: No  . Drug Use: No  . Sexual Activity: No   Other Topics Concern  . Not on file   Social History Narrative    Family Status  Relation Status Death Age  .  Brother Alive   . Father Deceased 39  . Sister Alive   . Sister Alive   . Mother Deceased 100   Family History  Problem Relation Age of Onset  . Heart disease Brother     Heart Disease before age 41  . Glaucoma Father   . Hypertension Father   . Stroke Paternal Grandmother   . Coronary artery disease Paternal Grandmother   . Cerebral aneurysm Brother   . Clotting disorder Sister      ROS:  Full 14 point review of systems complete and found to be negative unless listed above.  Physical Exam: Blood pressure 129/67, pulse 67, temperature 97.8 F (36.6 C), temperature source Oral, resp. rate 23, height 5\' 3"  (1.6 m), weight 148 lb (67.132 kg), SpO2 100 %.  General: chronically ill appearing, female on vent Head: Eyes PERRLA, No xanthomas. Normocephalic and atraumatic, oropharynx without edema or exudate.  Lungs: Resp regular and unlabored, CTA anteriority.  Heart: RRR no s3, s4, or murmurs..   Neck: No carotid bruits. No lymphadenopathy.  JVD. Abdomen: Bowel sounds present, abdomen soft and non-tender without masses or hernias noted. Msk:  No spine or cva tenderness. No weakness, no joint deformities or effusions. Extremities: No clubbing, cyanosis or edema. DP/PT/Radials 2+ and equal bilaterally. Neuro: intubated  No focal deficits noted. Psych:  Intubated  Skin: No rashes or lesions noted.  Labs:   Lab Results  Component Value Date   WBC 8.8 02/02/2016   HGB 10.9* 02/02/2016   HCT 31.1* 02/02/2016   MCV 80.8 02/02/2016   PLT PENDING 02/02/2016    Recent Labs  02/02/16 0700  INR 1.19    Recent Labs Lab 02/02/16 0700  NA 125*  K 4.6  CL 90*  CO2 23  BUN 23*  CREATININE 1.37*  CALCIUM 9.4  PROT 6.7  BILITOT 0.9  ALKPHOS 56  ALT 22  AST 28  GLUCOSE 191*  ALBUMIN 4.2   MAGNESIUM  Date Value Ref Range Status  10/03/2015 2.1 1.7 - 2.4 mg/dL Final   No results for input(s): CKTOTAL, CKMB, TROPONINI in the last 72 hours.  Recent Labs  02/02/16 0654    TROPIPOC 0.02   PRO B NATRIURETIC PEPTIDE (BNP)  Date/Time Value Ref Range Status  04/01/2015 07:38 AM 460.0* 0.0 - 100.0 pg/mL Final   Lab Results  Component Value Date   CHOL 108 12/09/2015   HDL 34.50* 12/09/2015   LDLCALC 63 12/09/2015   TRIG 52.0 12/09/2015   No results found for: DDIMER No results found for: LIPASE, AMYLASE TSH  Date/Time Value Ref Range Status  09/09/2015 07:50 PM 2.595 0.350 - 4.500 uIU/mL Final  10/04/2014 08:55 AM 1.78 0.35 - 4.50 uIU/mL Final   No results found for: VITAMINB12, FOLATE, FERRITIN, TIBC, IRON, RETICCTPCT  Carotid US 12/09/15 R CEA patent with [id number] LICA 123456 R vertebral occluded  LHC 12/16 LM 75% LAD ostial 75%, mid 80%, distal 99% RI ostial 95% LCx ostial 50%, mid 60%, OM1 80% RCA ostial 100% SVG-L PDA 100% SVG-D1 1 100% SVG-RI 100% LIMA-LAD normal Ischemic cardiomyopathy with severe LV dysfunction with an ejection fraction of 20-25% with hypokinesis anterolaterally, focal apical akinesis, severe hypokinesis inferiorly with evidence for probable 3+ mitral regurgitation. Severe native CAD with 75% distal left main stenosis. Prior to trifurcating into the LAD, ramus intermediate, and left circumflex vessel. 75% ostial LAD stenosis with a flush and fill phenomena seen in the mid LAD after the proximal septal and diagonal vessel due to competitive filling from the LIMA graft. 95 - 99% diffuse ramus intermediate stenosis proximally with faint filling the mid vessel which appeared small caliber. Large dominant left circumflex coronary artery with 50% ostial stenosis followed by 30% proximal stenosis, 80% stenosis of a small obtuse marginal branch diffusely proximally, 60% mid AV groove stenosis in the region of another marginal vessel, and mild irregularity distally into the PDA with evidence for collateralization to a nondominant RCA retrograde up to near its ostium. Total flush occlusion of the native RCA at its origin. Total  occlusion of all 3 saphenous vein grafts arising from the aorta which previously had supplied the diagonal vessel, a ramus intermediate vessel, and sequentially supplied a marginal branch and PDA branch of a dominant circumflex. Patent LIMA graft that supplies the mid LAD. The LAD fills proximal to the insertion and there is narrowing of 80% prior to giving rise to a moderate-sized diagonal vessel. Beyond the anastomosis, after the takeoff of a smaller diagonal vessel which collateralizes portion of the previous diagonal, the distal portion of the native LAD had a 99% stenosis before the apex. RECOMMENDATION: Angiograms will be reviewed with colleagues. Prior to any decision concerning possible intervention, Dr. Cyndia Bent will be consulted to see if the patient is a candidate for redo CABG surgery in light of her graft occlusions and distal left main disease. If she is turned down for surgery, consider possible percutaneous coronary intervention into the distal native LAD via the LIMA graft and possible high risk left main and circumflex intervention with hemodynamic support.   Echo 12/16 EF 20-25%, diffuse HK with inferolateral AK, grade 2 diastolic dysfunction, MAC, moderate to severe MR, moderate LAE, PASP 48 mmHg   ECG: EKG showed sinus rhythm at 79 bpm, prolonged PR interval, LVH with repolarization. Vent. rate 79 BPM PR interval 236 ms QRS duration 134 ms QT/QTc 413/473 ms P-R-T axes 63 89 143  Radiology:  Dg Chest 2 View  02/02/2016  CLINICAL DATA:  Centralized chest pain  radiating to LEFT arm since yesterday, shortness of breath, history coronary artery disease post MI and CABG, diabetes mellitus, hypertension EXAM: CHEST  2 VIEW COMPARISON:  12/29/2015 FINDINGS: Enlargement of cardiac silhouette post CABG. Pulmonary vascular congestion. Atherosclerotic calcification aorta. Improved pulmonary edema. No pleural effusion or pneumothorax. Bones demineralized. IMPRESSION: Mild pulmonary  edema, improved since previous exam. Electronically Signed   By: Lavonia Dana M.D.   On: 02/02/2016 07:28   Dg Chest Portable 1 View  02/02/2016  CLINICAL DATA:  Check endotracheal tube placement EXAM: PORTABLE CHEST 1 VIEW COMPARISON:  02/02/2016 FINDINGS: Endotracheal tube is been placed in lies approximately 2 cm above the carina. A nasogastric catheter is coiled within the stomach. Postsurgical changes are seen. Increasing perihilar densities are noted consistent with frank pulmonary edema. No sizable effusion is seen. No bony abnormality is noted. IMPRESSION: Tubes and lines in satisfactory position. Increasing pulmonary edema Electronically Signed   By: Inez Catalina M.D.   On: 02/02/2016 11:50    ASSESSMENT AND PLAN:      1. Acute on chronic Combined Systolic and Diastolic CHF  Leading to acute hypoxic respiratory failure - EF 20-25%, diffuse HK with inferolateral AK, grade 2 diastolic dysfunction, MAC, moderate to severe MR, moderate LAE, PASP 48 mmHg.  - Intubated due to worsening dyspnea. She recently had acute maxillary sinusitis. Held  nitrates, beta-blocker, ACE inhibitor, Spironolactone, statin and lasix.   2. Ischemic CM - EF 20-25%. - Pending repeat echo. If EF is still low need to discuss regarding candidacy for ICD with Dr. Fransico Him.   3. Carotid stenosis  -  She was extensive CAD and 3/4 grafts occluded on LHC in 12/16, she is likely to be high risk for any procedure.unsure if she has followed up with Dr. Donnetta Hutching of not.   5. Hx of breast cancer   6. Mitral Regurgitation - Mod to severe by recent echo. pending repeat echo   Signed: Bhagat,Bhavinkumar, [place] 02/02/2016, 1:04 PM Pager [phone]  Co-Sign MD  I have examined the patient and reviewed assessment and plan and discussed with patient.  Agree with above as stated.  Spoke at length with family.  Patient does not like having to urinate so may not be taking Lasix as prescribed.  In addition, she drink a lot of water.   She has been coughing and having trouble lying flat.   Continue diuresis.  Would have to consider PCI of the distal left main into the circ to restore flow to this large vessel. LIMA to LAD is patient but sheihas a significant lesion in the distal LAD past the LIMA insertion.  Saburo Luger S.

## 2016-02-02 NOTE — H&P (Signed)
PULMONARY / CRITICAL CARE MEDICINE   Name: Penny James MRN: [id number] DOB: Jun 07, 1951    ADMISSION DATE:  02/02/2016  REFERRING MD:  EDP PCP - Dr. 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  In ER she had marked respiratory distress.  She was treated with asa, nitro gtt, lasix IV. First troponin wnl. BNP 1635.  She continued to worsen despite attempts at bipap and was intubated.  PCCM called to admit.   Denies fevers, chills, BLE edema, rash, n/v/d, hemoptysis.      Cardiology was consulted by EDP.  Oncology has recommended breast conserving surgery with sentinel LN biopsy.  Pt has been referred to surgery but has not yet seen them. Recommended adjuvant chemo and radiation (March 2017) but this has not yet been done either.  PAST MEDICAL HISTORY :  She  has a past medical history of Hyperlipidemia; Arthritis; Anemia; Chronic combined systolic and diastolic CHF (congestive heart failure) ([place]); Bipolar affective disorder ([place]); GERD (gastroesophageal reflux disease); Obesity; Peripheral neuropathy ([place]); Renovascular hypertension; Diabetes mellitus; CAD (coronary artery disease); Carotid artery stenosis; Mitral regurgitation; Ischemic cardiomyopathy; CKD (chronic kidney disease), stage III; and Breast cancer ([place]).  PAST SURGICAL HISTORY: She  has past surgical history that includes Abdominal hysterectomy (1984); SP PTA RENAL / VISC  ARTERY (2003); Tonsillectomy; Endarterectomy (02/18/2012); Coronary artery bypass graft; Cardiac catheterization (03/25/09); and Cardiac catheterization (N/A, 10/05/2015).  Allergies  Allergen  Reactions  . Penicillins Rash    Has patient had a PCN reaction causing immediate rash, facial/tongue/throat swelling, SOB or lightheadedness with hypotension: Yes Has patient had a PCN reaction causing severe rash involving mucus membranes or skin necrosis: Yes Has patient had a PCN reaction that required hospitalization Yes- admitted to hospital Has patient had a PCN reaction occurring within the last 10 years: No, childhood allergy If all of the above answers are "NO", then may proceed with Cephalosporin use.   . Protamine     Severe hypotension Internal bleeding around heart  . Amlodipine Swelling    Causes swelling in higher dosage    No current facility-administered medications on file prior to encounter.   Current Outpatient Prescriptions on File Prior to Encounter  Medication Sig  . ALDACTONE 25 MG tablet Take 0.5 tablets (12.5 mg total) by mouth daily.  [name] aspirin EC 81 MG tablet Take 81 mg by mouth daily.  . carvedilol (COREG) 6.25 MG tablet Take 3 tablets (18.75 mg total) by mouth 2 (two) times daily with a meal.  . CRESTOR 40 MG tablet TAKE 1 TABLET BY MOUTH EVERY DAY  . ferrous sulfate 325 (65 FE) MG tablet Take 325 mg by mouth daily with breakfast.  . furosemide (LASIX) 80 MG tablet Take 1 tablet (80 mg total) by mouth every morning.  . lansoprazole (PREVACID) 15 MG capsule Take 15 mg by mouth daily.  . Multiple Vitamins-Minerals (MULTIVITAL) tablet Take 1 tablet by mouth daily.   . nitroGLYCERIN (NITROSTAT) 0.4 MG SL tablet Place 1 tablet (0.4 mg total) under the tongue every 5 (five) minutes as needed for chest pain.  [name] olopatadine (PATANOL) 0.1 % ophthalmic solution  Place 1 drop into both eyes 2 (two) times daily as needed for allergies.  [name] PLAVIX 75 MG tablet TAKE 1 TABLET BY MOUTH EVERY DAY  . ZESTRIL 2.5 MG tablet Take 2.5 mg by mouth daily.  [name] ZETIA 10 MG tablet TAKE 1 TABLET BY MOUTH EVERY DAY  . BAYER MICROLET LANCETS lancets Use as instructed  . doxycycline  (VIBRAMYCIN) 100 MG capsule Take 1 capsule (100 mg total) by mouth 2 (two) times daily. (Patient not taking: Reported on 02/02/2016)  . Glucose Blood (BAYER BREEZE 2 TEST) DISK Test blood sugar four times a day  . isosorbide mononitrate (IMDUR) 30 MG 24 hr tablet Take 1 tablet (30 mg total) by mouth daily. OK to use name brand Imdur if pt prefers. (Patient not taking: Reported on 02/02/2016)    FAMILY HISTORY:  Her indicated that her mother is deceased. She indicated that her father is deceased. She indicated that both of her sisters are alive. She indicated that her brother is alive.   SOCIAL HISTORY: She  reports that she has never smoked. She has never used smokeless tobacco. She reports that she does not drink alcohol or use illicit drugs.  REVIEW OF SYSTEMS:   Unable -- pt sedated on vent, hx obtained from records and ER staff.   SUBJECTIVE:    VITAL SIGNS: BP 129/60 mmHg  Pulse 75  Temp(Src) 97.8 F (36.6 C) (Oral)  Resp 20  Ht 5\' 3"  (1.6 m)  Wt 67.132 kg (148 lb)  BMI 26.22 kg/m2  SpO2 100%  HEMODYNAMICS:    VENTILATOR SETTINGS: Vent Mode:  [-] PRVC FiO2 (%):  [40 %-100 %] 100 % Set Rate:  [18 bmp] 18 bmp Vt Set:  [460 mL] 460 mL PEEP:  [5 cmH20] 5 cmH20 Plateau Pressure:  [29 cmH20] 29 cmH20  INTAKE / OUTPUT:    PHYSICAL EXAMINATION: General:  Chronically ill appearing female, NAD on vent  Neuro:  Sedated on propofol, RASS -2 HEENT:  Mm moist, ETT, POS JVD  Cardiovascular:  s1s2 rrr, tachy no m/r/g Lungs:  resps even non labored on vent, coarse, RR increased to 22  Abdomen:  Round, soft Musculoskeletal:  Warm and dry, no edema   LABS:  BMET  Recent Labs Lab 02/02/16 0700  NA 125*  K 4.6  CL 90*  CO2 23  BUN 23*  CREATININE 1.37*  GLUCOSE 191*    Electrolytes  Recent Labs Lab 02/02/16 0700  CALCIUM 9.4    CBC  Recent Labs Lab 02/02/16 0700  WBC 3.7*  HGB 10.9*  HCT 31.6*  PLT 135*    Coag's  Recent Labs Lab 02/02/16 0700   INR 1.19    Sepsis Markers No results for input(s): LATICACIDVEN, PROCALCITON, O2SATVEN in the last 168 hours.  ABG  Recent Labs Lab 02/02/16 0915 02/02/16 0957  PHART 7.263* 7.218*  PCO2ART 48.8* 56.3*  PO2ART 80.0 138.0*    Liver Enzymes  Recent Labs Lab 02/02/16 0700  AST 28  ALT 22  ALKPHOS 56  BILITOT 0.9  ALBUMIN 4.2    Cardiac Enzymes No results for input(s): TROPONINI, PROBNP in the last 168 hours.  Glucose No results for input(s): GLUCAP in the last 168 hours.  Imaging Dg Chest 2 View  02/02/2016  CLINICAL DATA:  Centralized chest pain radiating to LEFT arm since yesterday, shortness of breath, history coronary artery disease post MI and CABG, diabetes mellitus, hypertension EXAM: CHEST  2 VIEW COMPARISON:  12/29/2015 FINDINGS: Enlargement of cardiac silhouette  post CABG. Pulmonary vascular congestion. Atherosclerotic calcification aorta. Improved pulmonary edema. No pleural effusion or pneumothorax. Bones demineralized. IMPRESSION: Mild pulmonary edema, improved since previous exam. Electronically Signed   By: Lavonia Dana M.D.   On: 02/02/2016 07:28   Dg Chest Portable 1 View  02/02/2016  CLINICAL DATA:  Check endotracheal tube placement EXAM: PORTABLE CHEST 1 VIEW COMPARISON:  02/02/2016 FINDINGS: Endotracheal tube is been placed in lies approximately 2 cm above the carina. A nasogastric catheter is coiled within the stomach. Postsurgical changes are seen. Increasing perihilar densities are noted consistent with frank pulmonary edema. No sizable effusion is seen. No bony abnormality is noted. IMPRESSION: Tubes and lines in satisfactory position. Increasing pulmonary edema Electronically Signed   By: Inez Catalina M.D.   On: 02/02/2016 11:50    STUDIES:  CXC 04/06 > mild pulmonary edema.  CULTURES: None.  ANTIBIOTICS: None.  SIGNIFICANT EVENTS: None.  LINES/TUBES: ETT 04/06 >  DISCUSSION: 65 y.o. 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  Admitted 04/06 with SOB secondary to CHF exacerbation.  Concern for decompensated heart failure.  ASSESSMENT / PLAN:  CARDIOVASCULAR A:  Acute on chronic combined systolic and diastolic heart failure - EF 20-25% per echo from Dec 2016, grade 2DD. BNP 1635 on admission.  Hx HLD, CAD s/p CABG 2002, ICM, mitral regurg. P:  Diuresis as BP and SCr permit. Trend troponins. Assess echo. Cards consulted by EDP, appreciate the assistance. Continue outpatient ASA, plavix. Hold outpatient aldactone, carvedilol, crestor, furosemide, imdur, nitro, zetia, lisinopril. Restart as BP will allow  PULMONARY A: Acute hypoxic respiratory failure - presumed due to CHF exacerbation. Failed BiPAP in ED and required intubation. Pulmonary edema. P:  8cc/kg, wean Fio2 as able, increase PEEP if unable to get to 0.60 VAP prevention measures. SBT in AM if able. Diuresis as BP and SCr permit. Albuterol PRN. CXR in AM.  RENAL A:  Hyponatremia - likely due to hypervolemia. AoCKD. P:  KVO fluids. Assess serum osm BMP in AM.  GASTROINTESTINAL A:  GERD. Obesity. Nutrition. P:  SUP: Pantoprazole. NPO.  HEMATOLOGIC / ONCOLOGIC A:  Hx breast CA (invasive ductal carcinoma with DCIS, under the care of Dr. Lindi Adie.) - recommended adjuvant chemo, radiation. Oncology recommending breast conserving surgery with sentinel lymph node bx. Pancytopenia, ? Due to malignancy vs acute illness VTE Prophylaxis. P:  SCD's / heparin. CBC in AM. Will notify oncology of admission.  INFECTIOUS A:  No indication of infection currently. P:  Monitor clinically. Will consider empiric abx if she declines in any way  ENDOCRINE A:  DM.  P:  SSI. Assess Hgb A1c.  NEUROLOGIC A:  Acute metabolic  encephalopathy - due to sedation. Hx bipolar affective disorder, peripheral neuropathy, carotid artery stenosis. P:  Sedation: Fentanyl gtt / Versed PRN. RASS goal: 0 to -1. Daily WUA.   Family updated: None.  Interdisciplinary Family Meeting v Palliative Care Meeting: Due by: 04/13.   Montey Hora, [facility] Pager: [phone] or [phone] 02/02/2016, 11:49 AM   Attending Note:  I have examined patient, reviewed labs, studies and notes. I have discussed the case with [place], and I agree with the data and plans as amended above. 63 yop woman with CAD, prior CABG, ischemic and diastolic CHF. Also  with newly dx breast CA, not yet treated. Admitted with rapidly progressive hypoxemic resp failure requiring intubation and MV. Diuresis initiated in the ED. On my eval she is sedated, intubated, has B crackles on exam. Her CXR shows bilateral interstitial and alveolar infiltrates. Nothing on my eval to suggest a PNA. We will admit for stabilization, BP control, diuresis. Appreeciate cardiology assistance. Will notify oncology of her admission, try to facilitate her treatments (if she truly wants treatment) once acute issues stabilize. Independent critical care time is 45 minutes.     Baltazar Apo, MD, PhD 02/02/2016, 12:32 PM [facility] [phone] or if no answer [phone]

## 2016-02-02 NOTE — ED Provider Notes (Signed)
CSN: [id number]     Arrival date & time 02/02/16  [id number] History   First MD Initiated Contact with Patient 02/02/16 [phone]     Chief Complaint  Patient presents with  . Chest Pain     (Consider location/radiation/quality/duration/timing/severity/associated sxs/prior Treatment) HPI  65 year old female history of mitral regurgitation, is post CABG, congestive heart failure who presents today complaining of chest pain, dyspnea, and diaphoresis that began last night. She states she was well yesterday. She was unable to sleep last night. She has to sit upright in order to breathe. She has been coughing up some clear sputum. She denies any upper respiratory infection symptoms, fever, or chills. She denies any history of blood clots in her legs or lungs, lower extremity swelling, or rashes. She has been taking her Lasix as prescribed. Her primary care doctor is Dr. Maudie Mercury.  Past Medical History  Diagnosis Date  . Hyperlipidemia   . Arthritis   . Anemia   . Chronic combined systolic and diastolic CHF (congestive heart failure) ([place])     a. EF 55% by cath 2010 but in 09/2015 found to be 20%, with mod-severe MR, grade 2 DD.  . Bipolar affective disorder ([place])   . GERD (gastroesophageal reflux disease)   . Obesity   . Peripheral neuropathy ([place])   . 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 CAD (coronary artery disease)     a. CABG in 2002. b. Cath 09/2015: poor targets, not a good surgical candidate, high risk PCI reserved for recurrent symptoms.  . Carotid artery stenosis     60-79% bilateral ICA stenosis s/p Right CEA 2013  followed by Dr. Donnetta Hutching  . Mitral regurgitation     a. Mod-severe by echo 09/2015.  . Ischemic cardiomyopathy   . CKD (chronic kidney disease), stage III   . Breast cancer [facility])    Past Surgical History  Procedure Laterality Date  .  Abdominal hysterectomy  1984  . Sp pta renal / visc  artery  2003    with stent of left renal artery  . Tonsillectomy      AS A CHILD  . Endarterectomy  02/18/2012    rocedure: ENDARTERECTOMY CAROTID;  Surgeon: Rosetta Posner, MD;  Location: [facility] OR;  Service: Vascular;  Laterality: Right;  Right Carotid endarterectomy with Dacron patch angioplasty with resection of internal carotid artery  . Coronary artery bypass graft      3 vessel coronary disease S?P CABG with widely patent grafts by cath in 2005 and distal disease past the graft insertion sites.  . Cardiac catheterization  03/25/09    Occluded SVG to intermediate and SVG to OM/posterior descending artery with PTCA left circ 6/10 s/p PTCA stent in SVG to OM/PDA, 07/2002 s/p PCI of left circ 03/2009  . Cardiac catheterization N/A 10/05/2015    Procedure: Left Heart Cath and Cors/Grafts Angiography;  Surgeon: Troy Sine, MD;  Location: [facility];  Service: Cardiovascular;  Laterality: N/A;   Family History  Problem Relation Age of Onset  . Heart disease Brother     Heart Disease before age 37  . Glaucoma Father   . Hypertension Father   . Stroke Paternal Grandmother   . Coronary artery disease Paternal Grandmother   . Cerebral aneurysm Brother   . Clotting disorder Sister    Social History  Substance Use Topics  . Smoking status: Never Smoker   . Smokeless tobacco: Never Used  . Alcohol Use: No   OB History    No data available     Review of Systems  All other systems reviewed and are negative.     Allergies  Penicillins; Protamine; and Amlodipine  Home Medications   Prior to Admission medications   Medication Sig Start Date End Date Taking? Authorizing Provider  ALDACTONE 25 MG tablet Take 0.5 tablets (12.5 mg total) by mouth daily. 12/15/15  Yes Sueanne Margarita, MD  aspirin EC 81 MG tablet Take 81 mg by mouth daily.   Yes Historical Provider, MD  carvedilol (COREG) 6.25 MG tablet Take 3 tablets (18.75 mg  total) by mouth 2 (two) times daily with a meal. 01/19/16  Yes Traci R Turner, MD  CRESTOR 40 MG tablet TAKE 1 TABLET BY MOUTH EVERY DAY 09/14/15  Yes Sueanne Margarita, MD  ferrous sulfate 325 (65 FE) MG tablet Take 325 mg by mouth daily with breakfast.   Yes Historical Provider, MD  furosemide (LASIX) 80 MG tablet Take 1 tablet (80 mg total) by mouth every morning. 10/10/15  Yes Luke K Kilroy, PA-C  lansoprazole (PREVACID) 15 MG capsule Take 15 mg by mouth daily.   Yes Historical Provider, MD  Multiple Vitamins-Minerals (MULTIVITAL) tablet Take 1 tablet by mouth daily.    Yes Historical Provider, MD  nitroGLYCERIN (NITROSTAT) 0.4 MG SL tablet Place 1 tablet (0.4 mg total) under the tongue every 5 (five) minutes as needed for chest pain. 10/10/15  Yes Luke K Kilroy, PA-C  olopatadine (PATANOL) 0.1 % ophthalmic solution Place 1 drop into both eyes 2 (two) times daily as needed for allergies.   Yes Historical Provider, MD  PLAVIX 75 MG tablet TAKE 1 TABLET BY MOUTH EVERY DAY 12/19/15  Yes Sueanne Margarita, MD  ZESTRIL 2.5 MG tablet Take 2.5 mg by mouth daily. 11/20/15  Yes Historical Provider, MD  ZETIA 10 MG tablet TAKE 1 TABLET BY MOUTH EVERY DAY 11/15/15  Yes Sueanne Margarita, MD  BAYER MICROLET LANCETS lancets Use as instructed 08/19/15   Lucretia Kern, DO  doxycycline (VIBRAMYCIN) 100 MG capsule Take 1 capsule (100 mg total) by mouth 2 (two) times daily. Patient not taking: Reported on 02/02/2016 01/26/16   Lucretia Kern, DO  Glucose Blood (BAYER BREEZE 2 TEST) DISK Test blood sugar four times a day 08/19/15   Lucretia Kern, DO  isosorbide mononitrate (IMDUR) 30 MG 24 hr tablet Take 1 tablet (30 mg total) by mouth daily. OK to use name brand Imdur if pt prefers. Patient not taking: Reported on 02/02/2016 12/30/15   Evelene Croon Barrett, PA-C   BP 155/94 mmHg  Pulse 103  Temp(Src) 97.8 F (36.6 C) (Oral)  Resp 27  Ht 5\' 3"  (1.6 m)  Wt 67.132 kg  BMI 26.22 kg/m2  SpO2 96% Physical Exam  Constitutional: She is  oriented to person, place, and time. She appears well-developed. She appears distressed.  Patient with increased respiratory rate, work of breathing, and is diaphoretic  HENT:  Head: Normocephalic and atraumatic.  Right Ear: External ear normal.  Left Ear: External ear normal.  Nose: Nose normal.  Mouth/Throat: Oropharynx is clear and moist.  Eyes: Pupils are equal, round, and reactive to light.  Neck: Normal range of motion. Neck supple.  Cardiovascular: Normal rate and regular rhythm.   Pulmonary/Chest: She is in respiratory distress.  Diffuse rhonchi lower two  thirds lung fields  Abdominal: Soft. Bowel sounds are normal.  Musculoskeletal: She exhibits no edema or tenderness.  Neurological: She is alert and oriented to person, place, and time.  Skin:  Diaphoresis  Psychiatric: She has a normal mood and affect.  Nursing note and vitals reviewed.   ED Course  Procedures (including critical care time) Labs Review Labs Reviewed  BASIC METABOLIC PANEL - Abnormal; Notable for the following:    Sodium 125 (*)    Chloride 90 (*)    Glucose, Bld 191 (*)    BUN 23 (*)    Creatinine, Ser 1.37 (*)    GFR calc non Af Amer 39 (*)    GFR calc Af Amer 45 (*)    All other components within normal limits  CBC - Abnormal; Notable for the following:    WBC 3.7 (*)    Hemoglobin 10.9 (*)    HCT 31.6 (*)    Platelets 135 (*)    All other components within normal limits  BRAIN NATRIURETIC PEPTIDE - Abnormal; Notable for the following:    B Natriuretic Peptide 1635.8 (*)    All other components within normal limits  PROTIME-INR - Abnormal; Notable for the following:    Prothrombin Time 15.3 (*)    All other components within normal limits  HEPATIC FUNCTION PANEL  I-STAT TROPOININ, ED  I-STAT ARTERIAL BLOOD GAS, ED    Imaging Review Dg Chest 2 View  02/02/2016  CLINICAL DATA:  Centralized chest pain radiating to LEFT arm since yesterday, shortness of breath, history coronary artery  disease post MI and CABG, diabetes mellitus, hypertension EXAM: CHEST  2 VIEW COMPARISON:  12/29/2015 FINDINGS: Enlargement of cardiac silhouette post CABG. Pulmonary vascular congestion. Atherosclerotic calcification aorta. Improved pulmonary edema. No pleural effusion or pneumothorax. Bones demineralized. IMPRESSION: Mild pulmonary edema, improved since previous exam. Electronically Signed   By: Lavonia Dana M.D.   On: 02/02/2016 07:28   I have personally reviewed and evaluated these images and lab results as part of my medical decision-making.   EKG Interpretation   Date/Time:  Thursday February 02 2016 06:40:56 EDT Ventricular Rate:  79 PR Interval:  236 QRS Duration: 134 QT Interval:  413 QTC Calculation: 473 R Axis:   89 Text Interpretation:  Sinus rhythm Prolonged PR interval Probable left  atrial enlargement LVH with secondary repolarization abnormality Confirmed  by Jeneen Rinks  MD, [place] ([id number]) on 02/02/2016 6:57:22 AM Also confirmed by Jeneen Rinks   MD, [place] ([id number]), editor [place]  CCT, [place] ([id number])  on 02/02/2016  7:44:44 AM      MDM   Final diagnoses:  None    The patient is a 65 year old female who appears to be in marked respiratory distress. Her sats have been 95-100%. She is placed on oxygen and continues to have sats 95-96%. BiPAP is placed and she does not tolerate. This has been attempted multiple times and she states that she cannot tolerate this. We then attempted to place a nonrebreather mask. She states she cannot have something on her face. She is placed back on nasal cannula. She has been given aspirin, nitro drip is in place, and 100 mg of Lasix. Will he is in place and urine output has been fluid restricted and has an IV started but is not receiving IV fluids. She was initially hypertensive at 155/94 and has a nitroglycerin glycerin drip in place with blood pressure decreased to.  I have discussed the patient that she will likely need to be intubated  if we are not able  to EKG shows LVH with repolarization abnormalities that any significant change from prior. First troponin is normal. CRITICAL CARE Performed by: Teja Costen S Total critical care time: 90 minutes Critical care time was exclusive of separately billable procedures and treating other patients. Critical care was necessary to treat or prevent imminent or life-threatening deterioration. Critical care was time spent personally by me on the following activities: development of treatment plan with patient and/or surrogate as well as nursing, discussions with consultants, evaluation of patient's response to treatment, examination of patient, obtaining history from patient or surrogate, ordering and performing treatments and interventions, ordering and review of laboratory studies, ordering and review of radiographic studies, pulse oximetry and re-evaluation of patient's condition.  9:15 AM Patient agrees to 0.5 of Ativan and to retry BiPAP. She appears more diaphoretic.   1-respiratory distress. This is secondary to congestive heart failure. 2 chest pain no acute EKG changes, first troponin negative. Cannot rule out acute coronary syndrome. Patient is receiving nitroglycerin for her congestive heart failure and pain has decreased from 9 out of 10- to 7 out of 10. She is now receiving morphine. I will continue to evaluate her chest pain.  11:06 AM Patient with worsening respiratory failure despite being on BiPAP. PTH decreased from 7.26-7.21 and the patient indicated giving worsening respiratory failure.  INTUBATION Performed by: Shaune Pollack  Required items: required blood products, implants, devices, and special equipment available Patient identity confirmed: provided demographic data and hospital-assigned identification number Time out: Immediately prior to procedure a "time out" was called to verify the correct patient, procedure, equipment, support staff and site/side marked as  required.  Indications: Respiratory failure   Intubation method: Glidescope Laryngoscopy   Preoxygenation: Patient on BiPAP prior to intubation BVM  Sedatives: 20 Etomidate Paralytic: 100 Succinylcholine  Tube Size: 8-0 cuffed  Post-procedure assessment: chest rise and ETCO2 monitor Breath sounds: equal and absent over the epigastrium Tube secured with: ETT holder Chest x-Houa Ackert interpreted by radiologist and me.  Chest x-Val Schiavo findings:8-0endotracheal tube in appropriate position  Patient tolerated the procedure well with no immediate complications.   Patient sedated postintubation with propofol Post intubation sats 100% postintubation blood pressure 135/71 heart rate 70  11:44 AM Consulted Dr. Lamonte Sakai on call for critical care. Consulted cardiology and discussed with Trish. Patient with some breathing over event. Propofol 20 mg IV bolus given and increasing 25 mics to 30 mics per minute  Pattricia Boss, MD 02/02/16 1145

## 2016-02-02 NOTE — Progress Notes (Signed)
Patient transported from ED to room 2M01 without any complications.

## 2016-02-02 NOTE — Sedation Documentation (Signed)
Successful intubation by Dr. Jeanell Sparrow at 1100

## 2016-02-02 NOTE — ED Notes (Signed)
Pt removed BPAP mask stating "I can't take it!  I can't take anything on my nose.  I'm not putting that thing back on."   Pt is refusing BPAP and any mask that covers nose at this time.  Pt placed on 5L [place].  O2sat at 94%.  Ray MD made aware of pt refusal.

## 2016-02-02 NOTE — ED Notes (Signed)
V/O to d/c nitro per Cardiology

## 2016-02-02 NOTE — Progress Notes (Signed)
MD ordered patient to be placed on bipap.  Attempted to place patient on bipap and patient became nervous and was stating she could not handle it.  After MD spoke with patient, patient agreed to try bipap again.  Patient is currently wearing and tolerating well.  Will continue to monitor.

## 2016-02-03 ENCOUNTER — Ambulatory Visit (HOSPITAL_COMMUNITY): Payer: Medicare Other

## 2016-02-03 ENCOUNTER — Encounter: Payer: Self-pay | Admitting: Vascular Surgery

## 2016-02-03 ENCOUNTER — Inpatient Hospital Stay (HOSPITAL_COMMUNITY): Payer: Medicare Other

## 2016-02-03 ENCOUNTER — Ambulatory Visit: Payer: Medicare Other | Admitting: Physician Assistant

## 2016-02-03 DIAGNOSIS — R079 Chest pain, unspecified: Secondary | ICD-10-CM

## 2016-02-03 DIAGNOSIS — I214 Non-ST elevation (NSTEMI) myocardial infarction: Principal | ICD-10-CM

## 2016-02-03 DIAGNOSIS — J9601 Acute respiratory failure with hypoxia: Secondary | ICD-10-CM | POA: Insufficient documentation

## 2016-02-03 DIAGNOSIS — I5023 Acute on chronic systolic (congestive) heart failure: Secondary | ICD-10-CM | POA: Insufficient documentation

## 2016-02-03 LAB — CBC
HCT: 31.1 % — ABNORMAL LOW (ref 36.0–46.0)
Hemoglobin: 10.7 g/dL — ABNORMAL LOW (ref 12.0–15.0)
MCH: 26.6 pg (ref 26.0–34.0)
MCHC: 34.4 g/dL (ref 30.0–36.0)
MCV: 77.2 fL — ABNORMAL LOW (ref 78.0–100.0)
Platelets: 117 10*3/uL — ABNORMAL LOW (ref 150–400)
RBC: 4.03 MIL/uL (ref 3.87–5.11)
RDW: 14.9 % (ref 11.5–15.5)
WBC: 6.6 10*3/uL (ref 4.0–10.5)

## 2016-02-03 LAB — POCT I-STAT 3, ART BLOOD GAS (G3+)
Acid-Base Excess: 2 mmol/L (ref 0.0–2.0)
Bicarbonate: 24.4 mEq/L — ABNORMAL HIGH (ref 20.0–24.0)
O2 SAT: 100 %
PCO2 ART: 30.3 mmHg — AB (ref 35.0–45.0)
PO2 ART: 205 mmHg — AB (ref 80.0–100.0)
Patient temperature: 98.8
TCO2: 25 mmol/L (ref 0–100)
pH, Arterial: 7.515 — ABNORMAL HIGH (ref 7.350–7.450)

## 2016-02-03 LAB — ECHOCARDIOGRAM COMPLETE
Height: 63 in
Weight: 2398.6 oz

## 2016-02-03 LAB — BASIC METABOLIC PANEL
Anion gap: 14 (ref 5–15)
BUN: 27 mg/dL — AB (ref 6–20)
CALCIUM: 9.3 mg/dL (ref 8.9–10.3)
CO2: 21 mmol/L — ABNORMAL LOW (ref 22–32)
CREATININE: 1.63 mg/dL — AB (ref 0.44–1.00)
Chloride: 95 mmol/L — ABNORMAL LOW (ref 101–111)
GFR calc Af Amer: 37 mL/min — ABNORMAL LOW (ref >60)
GFR, EST NON AFRICAN AMERICAN: 32 mL/min — AB (ref >60)
Glucose, Bld: 70 mg/dL (ref 65–99)
POTASSIUM: 3.6 mmol/L (ref 3.5–5.1)
SODIUM: 130 mmol/L — AB (ref 135–145)

## 2016-02-03 LAB — BLOOD GAS, ARTERIAL
ACID-BASE DEFICIT: 0.7 mmol/L (ref 0.0–2.0)
Bicarbonate: 21 mEq/L (ref 20.0–24.0)
Drawn by: 42624
FIO2: 0.4
MECHVT: 460 mL
O2 Saturation: 99.6 %
PEEP: 5 cmH2O
PO2 ART: 203 mmHg — AB (ref 80.0–100.0)
Patient temperature: 98.6
RATE: 26 resp/min
TCO2: 21.7 mmol/L (ref 0–100)
pCO2 arterial: 21.5 mmHg — ABNORMAL LOW (ref 35.0–45.0)
pH, Arterial: 7.596 — ABNORMAL HIGH (ref 7.350–7.450)

## 2016-02-03 LAB — HEPARIN LEVEL (UNFRACTIONATED)
HEPARIN UNFRACTIONATED: 0.8 [IU]/mL — AB (ref 0.30–0.70)
HEPARIN UNFRACTIONATED: 0.61 [IU]/mL (ref 0.30–0.70)

## 2016-02-03 LAB — GLUCOSE, CAPILLARY
GLUCOSE-CAPILLARY: 83 mg/dL (ref 65–99)
GLUCOSE-CAPILLARY: 77 mg/dL (ref 65–99)
GLUCOSE-CAPILLARY: 84 mg/dL (ref 65–99)
Glucose-Capillary: 73 mg/dL (ref 65–99)
Glucose-Capillary: 77 mg/dL (ref 65–99)
Glucose-Capillary: 106 mg/dL — ABNORMAL HIGH (ref 65–99)

## 2016-02-03 LAB — MAGNESIUM: MAGNESIUM: 2 mg/dL (ref 1.7–2.4)

## 2016-02-03 LAB — PHOSPHORUS: PHOSPHORUS: 3 mg/dL (ref 2.5–4.6)

## 2016-02-03 LAB — HEMOGLOBIN A1C
Hgb A1c MFr Bld: 6.2 % — ABNORMAL HIGH (ref 4.8–5.6)
Mean Plasma Glucose: 131 mg/dL

## 2016-02-03 LAB — TROPONIN I: Troponin I: 1.61 ng/mL (ref <0.031)

## 2016-02-03 MED ORDER — HEPARIN BOLUS VIA INFUSION
3500.0000 [IU] | Freq: Once | INTRAVENOUS | Status: AC
  Administered 2016-02-03: 3500 [IU] via INTRAVENOUS
  Filled 2016-02-03: qty 3500

## 2016-02-03 MED ORDER — HEPARIN (PORCINE) IN NACL 100-0.45 UNIT/ML-% IJ SOLN
800.0000 [IU]/h | INTRAMUSCULAR | Status: DC
  Administered 2016-02-03: 800 [IU]/h via INTRAVENOUS
  Filled 2016-02-03: qty 250

## 2016-02-03 MED ORDER — VITAL HIGH PROTEIN PO LIQD
1000.0000 mL | ORAL | Status: DC
  Administered 2016-02-03 (×2)
  Administered 2016-02-03: 1000 mL
  Administered 2016-02-03 (×3)
  Administered 2016-02-04: 1000 mL
  Administered 2016-02-04 (×4)
  Administered 2016-02-04: 1000 mL
  Administered 2016-02-04 (×6)
  Administered 2016-02-05: 1000 mL
  Administered 2016-02-05 – 2016-02-06 (×20)

## 2016-02-03 MED ORDER — PERFLUTREN LIPID MICROSPHERE
1.0000 mL | INTRAVENOUS | Status: AC | PRN
  Administered 2016-02-03: 2 mL via INTRAVENOUS
  Filled 2016-02-03: qty 10

## 2016-02-03 MED ORDER — FUROSEMIDE 10 MG/ML IJ SOLN
40.0000 mg | Freq: Two times a day (BID) | INTRAMUSCULAR | Status: DC
  Administered 2016-02-03 – 2016-02-06 (×7): 40 mg via INTRAVENOUS
  Filled 2016-02-03 (×7): qty 4

## 2016-02-03 MED ORDER — PRO-STAT SUGAR FREE PO LIQD
30.0000 mL | Freq: Two times a day (BID) | ORAL | Status: AC
  Administered 2016-02-03 (×2): 30 mL
  Filled 2016-02-03 (×2): qty 30

## 2016-02-03 MED ORDER — HEPARIN (PORCINE) IN NACL 100-0.45 UNIT/ML-% IJ SOLN
850.0000 [IU]/h | INTRAMUSCULAR | Status: DC
  Administered 2016-02-04: 700 [IU]/h via INTRAVENOUS
  Filled 2016-02-03 (×2): qty 250

## 2016-02-03 NOTE — Progress Notes (Signed)
ANTICOAGULATION CONSULT NOTE - Follow Up Consult  Pharmacy Consult:  Heparin Indication: chest pain/ACS  Allergies  Allergen Reactions  . Penicillins Rash    Has patient had a PCN reaction causing immediate rash, facial/tongue/throat swelling, SOB or lightheadedness with hypotension: Yes Has patient had a PCN reaction causing severe rash involving mucus membranes or skin necrosis: Yes Has patient had a PCN reaction that required hospitalization Yes- admitted to hospital Has patient had a PCN reaction occurring within the last 10 years: No, childhood allergy If all of the above answers are "NO", then may proceed with Cephalosporin use.   . Protamine     Severe hypotension Internal bleeding around heart  . Amlodipine Swelling    Causes swelling in higher dosage    Patient Measurements: Height: 5\' 3"  (160 cm) Weight: 149 lb 14.6 oz (68 kg) IBW/kg (Calculated) : 52.4 Heparin Dosing Weight: 67 kg  Vital Signs: Temp: 98.6 F (37 C) (04/07 1127) Temp Source: Oral (04/07 1127) BP: 128/59 mmHg (04/07 1000) Pulse Rate: 74 (04/07 1127)  Labs:  Recent Labs  02/02/16 0700 02/02/16 1159 02/03/16 02/03/16 0300 02/03/16 1038  HGB 10.9* 10.9*  --  10.7*  --   HCT 31.6* 31.1*  --  31.1*  --   PLT 135* 100*  --  117*  --   LABPROT 15.3*  --   --   --   --   INR 1.19  --   --   --   --   HEPARINUNFRC  --   --   --   --  0.80*  CREATININE 1.37* 1.73*  --  1.63*  --   TROPONINI  --  0.14* 1.61*  --   --     Estimated Creatinine Clearance: 31 mL/min (by C-G formula based on Cr of 1.63).      Assessment: 54 YOF continues on IV heparin for NSTEMI.  Heparin level slightly supra-therapeutic; no bleeding reported.  Lab was drawn on the same arm as the heparin infusion, but distal to the IV site.   Goal of Therapy:  Heparin level 0.3-0.7 units/ml Monitor platelets by anticoagulation protocol: Yes    Plan:  - Reduce heparin gtt to 700 units/hr - Recheck 6 hr HL - Daily HL /  CBC   Mac Dowdell D. Mina Marble, PharmD, BCPS Pager:  [phone] 02/03/2016, 12:25 PM

## 2016-02-03 NOTE — Progress Notes (Signed)
  Echocardiogram 2D Echocardiogram has been performed.  Johny Chess 02/03/2016, 5:24 PM

## 2016-02-03 NOTE — Progress Notes (Signed)
Follow up - Critical Care Medicine Note  Patient Details:    VIMLA SOOS is an 65 y.o. 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 In ER she had marked respiratory distress. She was treated with asa, nitro gtt, lasix IV. First troponin wnl. BNP 1635. Her respiratory status continued to worsen despite attempts at bipap and was intubated. PCCM called to admit.   Lines/tubes : Airway 8 mm (Active)  Secured at (cm) 26 cm 02/03/2016 11:27 AM  Measured From Lips 02/03/2016 11:27 AM  Secured Location Right 02/03/2016 11:27 AM  Secured By [employer] 02/03/2016 11:27 AM  Tube Holder Repositioned Yes 02/03/2016 11:27 AM  Cuff Pressure (cm H2O) 24 cm H2O 02/02/2016  3:13 PM  Site Condition Dry 02/03/2016  8:00 AM     NG/OG Tube Orogastric 18 Fr. Right mouth (Active)  Placement Verification Auscultation 02/03/2016  8:00 AM  Site Assessment Clean;Dry;Intact 02/03/2016  8:00 AM  Status Clamped 02/03/2016  8:00 AM  Drainage Appearance Brown 02/03/2016  8:00 AM  Intake (mL) 30 mL 02/03/2016  4:00 AM  Output (mL) 0 mL 02/03/2016  4:00 AM     Urethral Catheter E.Hanes RN Double-lumen 14 Fr. (Active)  Indication for Insertion or Continuance of Catheter Unstable critical patients (first 24-48 hours) 02/03/2016  8:00 AM  Site Assessment Clean;Intact 02/03/2016  8:00 AM  Catheter Maintenance Bag below level of bladder;Catheter secured;Drainage bag/tubing not touching floor;Insertion date on drainage bag;No dependent loops;Seal intact 02/03/2016  8:00 AM  Collection Container Standard drainage bag 02/03/2016  8:00 AM  Securement Method Leg strap 02/03/2016  8:00 AM  Urinary Catheter Interventions Unclamped 02/02/2016   7:23 PM  Input (mL) 0 mL 02/02/2016  7:23 PM  Output (mL) 100 mL 02/03/2016 10:00 AM    Microbiology/Sepsis markers: Results for orders placed or performed during the hospital encounter of 02/02/16  MRSA PCR Screening     Status: None   Collection Time: 02/02/16  1:43 PM  Result Value Ref Range Status   MRSA by PCR NEGATIVE NEGATIVE Final    Comment:        The GeneXpert MRSA Assay (FDA approved for NASAL specimens only), is one component of a comprehensive MRSA colonization surveillance program. It is not intended to diagnose MRSA infection nor to guide or monitor treatment for MRSA infections.     Anti-infectives:  Anti-infectives    None      Best Practice/Protocols:  VTE Prophylaxis: Heparin (drip) Intermittent Sedation  Consults: Treatment Team:  Rounding Lbcardiology, MD    Studies: Dg Chest 2 View  02/02/2016  CLINICAL DATA:  Centralized chest pain radiating to LEFT arm since yesterday, shortness of breath, history coronary artery disease post MI and CABG, diabetes mellitus, hypertension EXAM: CHEST  2 VIEW COMPARISON:  12/29/2015 FINDINGS: Enlargement of cardiac silhouette post CABG. Pulmonary vascular congestion. Atherosclerotic calcification aorta. Improved pulmonary edema. No pleural effusion or pneumothorax. Bones demineralized. IMPRESSION: Mild pulmonary edema, improved since previous exam. Electronically Signed   By: Lavonia Dana M.D.   On: 02/02/2016 07:28   Dg Chest Portable 1 View  02/03/2016  CLINICAL DATA:  Respiratory failure. EXAM: PORTABLE CHEST 1 VIEW COMPARISON:  02/02/2016.  FINDINGS: Endotracheal tube and NG tube in stable position. Prior CABG. Cardiomegaly. Pulmonary vascular prominence with bilateral pulmonary infiltrates/ edema. Slight improvement from prior exam. IMPRESSION: 1. Lines and tubes stable position. 2. Prior CABG. Persistent cardiomegaly and bilateral pulmonary infiltrates/edema. Interim slight improvement from prior exam. Electronically  Signed   By: Ravenel   On: 02/03/2016 07:05   Dg Chest Portable 1 View  02/02/2016  CLINICAL DATA:  Check endotracheal tube placement EXAM: PORTABLE CHEST 1 VIEW COMPARISON:  02/02/2016 FINDINGS: Endotracheal tube is been placed in lies approximately 2 cm above the carina. A nasogastric catheter is coiled within the stomach. Postsurgical changes are seen. Increasing perihilar densities are noted consistent with frank pulmonary edema. No sizable effusion is seen. No bony abnormality is noted. IMPRESSION: Tubes and lines in satisfactory position. Increasing pulmonary edema Electronically Signed   By: Inez Catalina M.D.   On: 02/02/2016 11:50     Events:  Subjective:    Overnight Issues:  She had some chest pain which resolved after IV nitroglycerin. No chest pain this morning Patient was alert and able to follow commands   Objective:  Vital signs for last 24 hours: Temp:  [97.9 F (36.6 C)-99.5 F (37.5 C)] 98.6 F (37 C) (04/07 1127) Pulse Rate:  [65-80] 74 (04/07 1127) Resp:  [15-30] 26 (04/07 1127) BP: (114-151)/(26-131) 128/59 mmHg (04/07 1000) SpO2:  [100 %] 100 % (04/07 1127) FiO2 (%):  [30 %-70 %] 30 % (04/07 1127) Weight:  [149 lb 14.6 oz (68 kg)] 149 lb 14.6 oz (68 kg) (04/07 0700)  Hemodynamic parameters for last 24 hours:    Intake/Output from previous day: 04/06 0701 - 04/07 0700 In: 409.8 [I.V.:349.8; NG/GT:60] Out: 2625 [Urine:2625]  Intake/Output this shift: Total I/O In: 69 [I.V.:69] Out: 265 [Urine:265]  Vent settings for last 24 hours: Vent Mode:  [-] PRVC FiO2 (%):  [30 %-70 %] 30 % Set Rate:  [14 bmp-26 bmp] 14 bmp Vt Set:  [460 mL] 460 mL PEEP:  [5 cmH20] 5 cmH20 Pressure Support:  [10 cmH20] 10 cmH20 Plateau Pressure:  [19 cmH20-24 cmH20] 19 cmH20  Physical Exam:  Gen: Alert, intubated HEENT: ncat  CV: rrr, no mrg Pulm: crackles heard at anterior lungs Abd: + BS   Neuro: alert, RASS 0    Results for orders placed or performed  during the hospital encounter of 02/02/16 (from the past 24 hour(s))  I-Stat arterial blood gas, ED     Status: Abnormal   Collection Time: 02/02/16 12:06 PM  Result Value Ref Range   pH, Arterial 7.298 (L) 7.350 - 7.450   pCO2 arterial 50.7 (H) 35.0 - 45.0 mmHg   pO2, Arterial 326.0 (H) 80.0 - 100.0 mmHg   Bicarbonate 24.8 (H) 20.0 - 24.0 mEq/L   TCO2 26 0 - 100 mmol/L   O2 Saturation 100.0 %   Acid-base deficit 2.0 0.0 - 2.0 mmol/L   Patient temperature 98.6 F    Collection site RADIAL, ALLEN'S TEST ACCEPTABLE    Drawn by Operator    Sample type ARTERIAL   MRSA PCR Screening     Status: None   Collection Time: 02/02/16  1:43 PM  Result Value Ref Range   MRSA by PCR NEGATIVE NEGATIVE  Glucose, capillary     Status: Abnormal   Collection Time: 02/02/16  1:55 PM  Result Value Ref Range   Glucose-Capillary 144 (H) 65 - 99 mg/dL  Glucose, capillary     Status: Abnormal   Collection  Time: 02/02/16  3:49 PM  Result Value Ref Range   Glucose-Capillary 111 (H) 65 - 99 mg/dL  Glucose, capillary     Status: None   Collection Time: 02/02/16  7:11 PM  Result Value Ref Range   Glucose-Capillary 95 65 - 99 mg/dL  Glucose, capillary     Status: None   Collection Time: 02/02/16 11:17 PM  Result Value Ref Range   Glucose-Capillary 83 65 - 99 mg/dL  Troponin I     Status: Abnormal   Collection Time: 02/03/16 12:00 AM  Result Value Ref Range   Troponin I 1.61 (HH) <0.031 ng/mL  CBC     Status: Abnormal   Collection Time: 02/03/16  3:00 AM  Result Value Ref Range   WBC 6.6 4.0 - 10.5 K/uL   RBC 4.03 3.87 - 5.11 MIL/uL   Hemoglobin 10.7 (L) 12.0 - 15.0 g/dL   HCT 31.1 (L) 36.0 - 46.0 %   MCV 77.2 (L) 78.0 - 100.0 fL   MCH 26.6 26.0 - 34.0 pg   MCHC 34.4 30.0 - 36.0 g/dL   RDW 14.9 11.5 - 15.5 %   Platelets 117 (L) 150 - 400 K/uL  Basic metabolic panel     Status: Abnormal   Collection Time: 02/03/16  3:00 AM  Result Value Ref Range   Sodium 130 (L) 135 - 145 mmol/L   Potassium 3.6  3.5 - 5.1 mmol/L   Chloride 95 (L) 101 - 111 mmol/L   CO2 21 (L) 22 - 32 mmol/L   Glucose, Bld 70 65 - 99 mg/dL   BUN 27 (H) 6 - 20 mg/dL   Creatinine, Ser 1.63 (H) 0.44 - 1.00 mg/dL   Calcium 9.3 8.9 - 10.3 mg/dL   GFR calc non Af Amer 32 (L) >60 mL/min   GFR calc Af Amer 37 (L) >60 mL/min   Anion gap 14 5 - 15  Magnesium     Status: None   Collection Time: 02/03/16  3:00 AM  Result Value Ref Range   Magnesium 2.0 1.7 - 2.4 mg/dL  Phosphorus     Status: None   Collection Time: 02/03/16  3:00 AM  Result Value Ref Range   Phosphorus 3.0 2.5 - 4.6 mg/dL  Glucose, capillary     Status: None   Collection Time: 02/03/16  3:12 AM  Result Value Ref Range   Glucose-Capillary 73 65 - 99 mg/dL  Blood gas, arterial     Status: Abnormal   Collection Time: 02/03/16  4:20 AM  Result Value Ref Range   FIO2 0.40    Delivery systems VENTILATOR    Mode PRESSURE REGULATED VOLUME CONTROL    VT 460 mL   LHR 26 resp/min   Peep/cpap 5.0 cm H20   pH, Arterial 7.596 (H) 7.350 - 7.450   pCO2 arterial 21.5 (L) 35.0 - 45.0 mmHg   pO2, Arterial 203 (H) 80.0 - 100.0 mmHg   Bicarbonate 21.0 20.0 - 24.0 mEq/L   TCO2 21.7 0 - 100 mmol/L   Acid-base deficit 0.7 0.0 - 2.0 mmol/L   O2 Saturation 99.6 %   Patient temperature 98.6    Collection site RIGHT RADIAL    Drawn by [phone]    Sample type ARTERIAL DRAW    Allens test (pass/fail) PASS PASS  Glucose, capillary     Status: None   Collection Time: 02/03/16  7:17 AM  Result Value Ref Range   Glucose-Capillary 77 65 - 99 mg/dL  I-STAT 3,  arterial blood gas (G3+)     Status: Abnormal   Collection Time: 02/03/16 10:37 AM  Result Value Ref Range   pH, Arterial 7.515 (H) 7.350 - 7.450   pCO2 arterial 30.3 (L) 35.0 - 45.0 mmHg   pO2, Arterial 205.0 (H) 80.0 - 100.0 mmHg   Bicarbonate 24.4 (H) 20.0 - 24.0 mEq/L   TCO2 25 0 - 100 mmol/L   O2 Saturation 100.0 %   Acid-Base Excess 2.0 0.0 - 2.0 mmol/L   Patient temperature 98.8 F    Collection site  RADIAL, ALLEN'S TEST ACCEPTABLE    Drawn by Operator    Sample type ARTERIAL   Heparin level (unfractionated)     Status: Abnormal   Collection Time: 02/03/16 10:38 AM  Result Value Ref Range   Heparin Unfractionated 0.80 (H) 0.30 - 0.70 IU/mL     Assessment/Plan:    PULMONARY A: Acute hypoxic respiratory failure - presumed due to CHF exacerbation. Failed BiPAP in ED and required intubation. Pulmonary edema. Pt has diuresed very well  P:  -continue intubation- wean FiO2 as able  -continue diuresis - start lasix 40 mg IV BID  -Albuterol PRN.   CARDIOVASCULAR A:  Acute on chronic combined systolic and diastolic heart failure - EF 20-25% per echo from Dec 2016, grade 2DD. BNP 1635 on admission.  Hx HLD, CAD s/p CABG 2002, ICM, mitral regurg. Also, carotid stenosis-/  -Patient has intermittent compliance with her lasix.  -Had left heart cath in Dec 2016, and 3/4 grafts were occluded  -has incomplete LBBB and ST depressions noted on ECG this morning Had some chest pain that resolved by IV nitroglycerin   P:  -cardiology following  -started heparin drip- per cardiology, once she is extubated, then will consider high risk PCI of the distal left main vessel to restore the blood flow  -continue diuresis - Lasix 40 mg IV BID  -echo results pending  -continue aspirin and plavix  -holding aldactone, coreg, crestor, imdur, zetia and lisinopril- restart per blood pressure    RENAL A:  Hyponatremia - improved, likely due to hypervolemia. AoCKD.  P:  KVO fluids.  GASTROINTESTINAL A:  GERD. Obesity. Nutrition.  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 Lindi Adie.) - recommended adjuvant chemo, radiation. Oncology recommending breast conserving surgery with sentinel lymph node bx. Pancytopenia, ? Due to malignancy vs acute illness  -microcytic anemia +/- anemia of chronic disease    P:   -heparin gtt -trend hemoglobin  INFECTIOUS A:  No indication of infection currently. P:  -daily CBC, monitor fever curve  ENDOCRINE A:  Hyperglycemia- hemoglobin a1c is 6.2  P:  SSI.  NEUROLOGIC A:  Acute metabolic encephalopathy - due to sedation. Hx bipolar affective disorder, peripheral neuropathy, carotid artery stenosis. P:  Sedation: Fentanyl gtt / Versed PRN. RASS goal: 0 to -1. Daily WUA.    Burgess Estelle 02/03/2016

## 2016-02-03 NOTE — Progress Notes (Signed)
Initial Nutrition Assessment  DOCUMENTATION CODES:   Not applicable  INTERVENTION:    Initiate TF via OGT with Vital AF 1.2 at 25 ml/h and Prostat 30 ml BID; on day 2, d/c Prostat and increase to goal rate of 55 ml/h (1320 ml per day) to provide 1584 kcals, 99 gm protein, 1071 ml free water daily.  NUTRITION DIAGNOSIS:   Inadequate oral intake related to inability to eat as evidenced by NPO status.  GOAL:   Patient will meet greater than or equal to 90% of their needs  MONITOR:   Vent status, Labs, Weight trends, TF tolerance, I & O's  REASON FOR ASSESSMENT:   Consult Enteral/tube feeding initiation and management  ASSESSMENT:   65 y.o. 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 Required intubation.  Discussed patient in ICU rounds and with RN today. No plans for return to OR, so TF can be initiated. Received MD Consult for TF initiation and management. Patient is currently intubated on ventilator support MV: 11.5 L/min Temp (24hrs), Avg:98.7 F (37.1 C), Min:97.9 F (36.6 C), Max:99.5 F (37.5 C)   Diet Order:   NPO  Skin:  Reviewed, no issues  Last BM:  PTA  Height:   Ht Readings from Last 1 Encounters:  02/02/16 5\' 3"  (1.6 m)    Weight:   Wt Readings from Last 1 Encounters:  02/03/16 149 lb 14.6 oz (68 kg)    Ideal Body Weight:  52.3 kg  BMI:  Body mass index is 26.56 kg/(m^2).  Estimated Nutritional Needs:   Kcal:  [id number]  Protein:  90-110 gm  Fluid:  1.6-1.8 L  EDUCATION NEEDS:   No education needs identified at this time  Molli Barrows, [place], [place], [place] Pager [phone] After Hours Pager [phone]

## 2016-02-03 NOTE — Progress Notes (Signed)
ANTICOAGULATION CONSULT NOTE - Follow Up Consult  Pharmacy Consult for heparin Indication: chest pain/ACS  Allergies  Allergen Reactions  . Penicillins Rash    Has patient had a PCN reaction causing immediate rash, facial/tongue/throat swelling, SOB or lightheadedness with hypotension: Yes Has patient had a PCN reaction causing severe rash involving mucus membranes or skin necrosis: Yes Has patient had a PCN reaction that required hospitalization Yes- admitted to hospital Has patient had a PCN reaction occurring within the last 10 years: No, childhood allergy If all of the above answers are "NO", then may proceed with Cephalosporin use.   . Protamine     Severe hypotension Internal bleeding around heart  . Amlodipine Swelling    Causes swelling in higher dosage    Patient Measurements: Height: 5' 3"  (160 cm) Weight: 149 lb 14.6 oz (68 kg) IBW/kg (Calculated) : 52.4 Heparin Dosing Weight:   Vital Signs: Temp: 98.5 F (36.9 C) (04/07 1507) Temp Source: Oral (04/07 1507) BP: 144/70 mmHg (04/07 1915) Pulse Rate: 76 (04/07 1915)  Labs:  Recent Labs  02/02/16 0700 02/02/16 1159 02/03/16 02/03/16 0300 02/03/16 1038 02/03/16 1922  HGB 10.9* 10.9*  --  10.7*  --   --   HCT 31.6* 31.1*  --  31.1*  --   --   PLT 135* 100*  --  117*  --   --   LABPROT 15.3*  --   --   --   --   --   INR 1.19  --   --   --   --   --   HEPARINUNFRC  --   --   --   --  0.80* 0.61  CREATININE 1.37* 1.73*  --  1.63*  --   --   TROPONINI  --  0.14* 1.61*  --   --   --     Estimated Creatinine Clearance: 31 mL/min (by C-G formula based on Cr of 1.63).   Medications:  Scheduled:  . antiseptic oral rinse  7 mL Mouth Rinse QID  . aspirin  81 mg Per Tube Daily  . chlorhexidine gluconate (SAGE KIT)  15 mL Mouth Rinse BID  . clopidogrel  75 mg Per Tube Daily  . feeding supplement (PRO-STAT SUGAR FREE 64)  30 mL Per Tube BID  . feeding supplement (VITAL HIGH PROTEIN)  1,000 mL Per Tube Q24H  .  furosemide  40 mg Intravenous BID  . insulin aspart  0-15 Units Subcutaneous 6 times per day  . pantoprazole (PROTONIX) IV  40 mg Intravenous QHS   Infusions:  . sodium chloride 10 mL/hr at 02/03/16 0600  . fentaNYL infusion INTRAVENOUS 50 mcg/hr (02/03/16 0600)  . heparin 700 Units/hr (02/03/16 1230)  . nitroGLYCERIN 5 mcg/min (02/03/16 1019)    Assessment: 65 yo female with chest pain is currently on therapeutic heparin. Heparin level is  Goal of Therapy:  Heparin level 0.3-0.7 units/ml Monitor platelets by anticoagulation protocol: Yes   Plan:  - continue heparin at 700 units/hr - heparin level in am  Ghali Morissette, [place] 02/03/2016,7:58 PM

## 2016-02-03 NOTE — Progress Notes (Addendum)
SUBJECTIVE:  Apparently had chest pain earlier which resolved after IV NTG.  Denies chest pain at this time.    OBJECTIVE:   Vitals:   Filed Vitals:   02/03/16 0945 02/03/16 1000 02/03/16 1015 02/03/16 1030  BP:  128/59    Pulse: 71 71 73 76  Temp:      TempSrc:      Resp: 18 18 22 19   Height:      Weight:      SpO2: 100% 100% 100% 100%   I&O's:   Intake/Output Summary (Last 24 hours) at 02/03/16 1124 Last data filed at 02/03/16 1000  Gross per 24 hour  Intake 478.75 ml  Output   2765 ml  Net -2286.25 ml   TELEMETRY: Reviewed telemetry pt in NSR:     PHYSICAL EXAM General: Intubated, nods head to questions Head:   Normal cephalic and atramatic  Lungs:  Coarse breath sounds bilaterally to auscultation. Heart:   HRRR S1 S2  No JVD.   Abdomen: abdomen soft and non-tender Msk:  Back normal,  Moves head Extremities:   No edema.   Neuro: Intubated Psych:  intubated Skin: No rash   LABS: Basic Metabolic Panel:  Recent Labs  02/02/16 0700 02/02/16 1159 02/03/16 0300  NA 125*  --  130*  K 4.6  --  3.6  CL 90*  --  95*  CO2 23  --  21*  GLUCOSE 191*  --  70  BUN 23*  --  27*  CREATININE 1.37* 1.73* 1.63*  CALCIUM 9.4  --  9.3  MG  --  2.2 2.0  PHOS  --  5.4* 3.0   Liver Function Tests:  Recent Labs  02/02/16 0700  AST 28  ALT 22  ALKPHOS 56  BILITOT 0.9  PROT 6.7  ALBUMIN 4.2   No results for input(s): LIPASE, AMYLASE in the last 72 hours. CBC:  Recent Labs  02/02/16 1159 02/03/16 0300  WBC 8.8 6.6  HGB 10.9* 10.7*  HCT 31.1* 31.1*  MCV 80.8 77.2*  PLT 100* 117*   Cardiac Enzymes:  Recent Labs  02/02/16 1159 02/03/16  TROPONINI 0.14* 1.61*   BNP: Invalid input(s): POCBNP D-Dimer: No results for input(s): DDIMER in the last 72 hours. Hemoglobin A1C:  Recent Labs  02/02/16 1159  HGBA1C 6.2*   Fasting Lipid Panel: No results for input(s): CHOL, HDL, LDLCALC, TRIG, CHOLHDL, LDLDIRECT in the last 72 hours. Thyroid Function  Tests: No results for input(s): TSH, T4TOTAL, T3FREE, THYROIDAB in the last 72 hours.  Invalid input(s): FREET3 Anemia Panel: No results for input(s): VITAMINB12, FOLATE, FERRITIN, TIBC, IRON, RETICCTPCT in the last 72 hours. Coag Panel:   Lab Results  Component Value Date   INR 1.19 02/02/2016   INR 1.15 12/29/2015   INR 1.22 10/02/2015    RADIOLOGY: Dg Chest 2 View  02/02/2016  CLINICAL DATA:  Centralized chest pain radiating to LEFT arm since yesterday, shortness of breath, history coronary artery disease post MI and CABG, diabetes mellitus, hypertension EXAM: CHEST  2 VIEW COMPARISON:  12/29/2015 FINDINGS: Enlargement of cardiac silhouette post CABG. Pulmonary vascular congestion. Atherosclerotic calcification aorta. Improved pulmonary edema. No pleural effusion or pneumothorax. Bones demineralized. IMPRESSION: Mild pulmonary edema, improved since previous exam. Electronically Signed   By: Lavonia Dana M.D.   On: 02/02/2016 07:28   Dg Chest Portable 1 View  02/03/2016  CLINICAL DATA:  Respiratory failure. EXAM: PORTABLE CHEST 1 VIEW COMPARISON:  02/02/2016. FINDINGS: Endotracheal tube and NG  tube in stable position. Prior CABG. Cardiomegaly. Pulmonary vascular prominence with bilateral pulmonary infiltrates/ edema. Slight improvement from prior exam. IMPRESSION: 1. Lines and tubes stable position. 2. Prior CABG. Persistent cardiomegaly and bilateral pulmonary infiltrates/edema. Interim slight improvement from prior exam. Electronically Signed   By: [place]   On: 02/03/2016 07:05   Dg Chest Portable 1 View  02/02/2016  CLINICAL DATA:  Check endotracheal tube placement EXAM: PORTABLE CHEST 1 VIEW COMPARISON:  02/02/2016 FINDINGS: Endotracheal tube is been placed in lies approximately 2 cm above the carina. A nasogastric catheter is coiled within the stomach. Postsurgical changes are seen. Increasing perihilar densities are noted consistent with frank pulmonary edema. No sizable effusion  is seen. No bony abnormality is noted. IMPRESSION: Tubes and lines in satisfactory position. Increasing pulmonary edema Electronically Signed   By: Inez Catalina M.D.   On: 02/02/2016 11:50      ASSESSMENT / PLAN:    1) Acute on chronic systolic heart failure:  Diuresed well.  Requiring less vent support.  Now with increased troponin and increased creatinine.  Known CAD with disease in the circ territory.  Once she is extubated, we can consider high risk PCI- she would have to agree to the risks of the procedure.  I have asked Dr. Tamala Julian to look at the prior cath pictures from 12/16 since he will be in the cath lab on Monday and Tuesday.  She would have to agree and hopefully, her renal function would be back to baseline as well.  We would also have to get a sense of medication compliance which has been an issue per the family.    She has had issues with fluid overload in the past when she has skipped her lasix per the family.  Incomplete LBBB, did ST depressions noted by ECG this AM.   Jettie Booze, MD  02/03/2016  11:24 AM

## 2016-02-03 NOTE — Progress Notes (Signed)
ANTICOAGULATION CONSULT NOTE - Initial Consult  Pharmacy Consult for Heparin Indication: chest pain/ACS  Allergies  Allergen Reactions  . Penicillins Rash    Has patient had a PCN reaction causing immediate rash, facial/tongue/throat swelling, SOB or lightheadedness with hypotension: Yes Has patient had a PCN reaction causing severe rash involving mucus membranes or skin necrosis: Yes Has patient had a PCN reaction that required hospitalization Yes- admitted to hospital Has patient had a PCN reaction occurring within the last 10 years: No, childhood allergy If all of the above answers are "NO", then may proceed with Cephalosporin use.   . Protamine     Severe hypotension Internal bleeding around heart  . Amlodipine Swelling    Causes swelling in higher dosage    Patient Measurements: Height: _0  (160 cm) Weight: 148 lb (67.132 kg) IBW/kg (Calculated) : 52.4 Heparin Dosing Weight: 67 kg  Vital Signs: Temp: 99.1 F (37.3 C) (04/06 2316) Temp Source: Oral (04/06 2316) BP: 137/70 mmHg (04/06 2200) Pulse Rate: 77 (04/06 2200)  Labs:  Recent Labs  02/02/16 0700 02/02/16 1159 02/03/16  HGB 10.9* 10.9*  --   HCT 31.6* 31.1*  --   PLT 135* 100*  --   LABPROT 15.3*  --   --   INR 1.19  --   --   CREATININE 1.37* 1.73*  --   TROPONINI  --  0.14* 1.61*    Estimated Creatinine Clearance: 29 mL/min (by C-G formula based on Cr of 1.73).   Medical History: Past Medical History  Diagnosis Date  . Hyperlipidemia   . Arthritis   . Anemia   . Chronic combined systolic and diastolic CHF (congestive heart failure) ([place])     a. EF 55% by cath 2010 but in 09/2015 found to be 20%, with mod-severe MR, grade 2 DD.  . Bipolar affective disorder ([place])   . GERD (gastroesophageal reflux disease)   . Obesity   . Peripheral neuropathy ([place])   . 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 Diabetes mellitus     retinopaty, neuropathy ischemic cardiomyopathy with  inferior hypokinesis EF normalized in 2005  . CAD (coronary artery disease)     a. CABG in 2002. b. Cath 09/2015: poor targets, not a good surgical candidate, high risk PCI reserved for recurrent symptoms.  . Carotid artery stenosis     60-79% bilateral ICA stenosis s/p Right CEA 2013  followed by Dr. Donnetta Hutching  . Mitral regurgitation     a. Mod-severe by echo 09/2015.  . Ischemic cardiomyopathy   . CKD (chronic kidney disease), stage III   . Breast cancer ([facility])     Medications:  Scheduled:  . antiseptic oral rinse  7 mL Mouth Rinse QID  . aspirin  81 mg Per Tube Daily  . chlorhexidine gluconate (SAGE KIT)  15 mL Mouth Rinse BID  . clopidogrel  75 mg Per Tube Daily  . heparin  5,000 Units Subcutaneous 3 times per day  . insulin aspart  0-15 Units Subcutaneous 6 times per day  . pantoprazole (PROTONIX) IV  40 mg Intravenous QHS  . potassium chloride  20 mEq Oral Once  . potassium chloride  40 mEq Oral Once    Assessment: 65 y.o. F presents with SOB, CP - required intubation in ED. To begin heparin for r/o ACS. Trop up to 1.61. Cardiology consulted. Baseline plt 100 (146 ~3 wks ago). Hgb 10.9.   SCr 1.73 (1.18 ~3 wks ago),  est CrCl 29 ml/min.  Goal of Therapy:  Heparin level 0.3-0.7 units/ml Monitor platelets by anticoagulation protocol: Yes   Plan:  D/c SQ heparin Heparin IV bolus 3500 units Heparin gtt at 800 units/hr Will f/u heparin level in 8 hours Daily heparin level and CBC  Sherlon Handing, PharmD, BCPS Clinical pharmacist, pager [phone] 02/03/2016,1:30 AM

## 2016-02-04 ENCOUNTER — Inpatient Hospital Stay (HOSPITAL_COMMUNITY): Payer: Medicare Other

## 2016-02-04 DIAGNOSIS — J810 Acute pulmonary edema: Secondary | ICD-10-CM

## 2016-02-04 DIAGNOSIS — I25111 Atherosclerotic heart disease of native coronary artery with angina pectoris with documented spasm: Secondary | ICD-10-CM

## 2016-02-04 LAB — CBC
HCT: 29.4 % — ABNORMAL LOW (ref 36.0–46.0)
HEMOGLOBIN: 9.9 g/dL — AB (ref 12.0–15.0)
MCH: 26.7 pg (ref 26.0–34.0)
MCHC: 33.7 g/dL (ref 30.0–36.0)
MCV: 79.2 fL (ref 78.0–100.0)
Platelets: 112 10*3/uL — ABNORMAL LOW (ref 150–400)
RBC: 3.71 MIL/uL — ABNORMAL LOW (ref 3.87–5.11)
RDW: 15.8 % — ABNORMAL HIGH (ref 11.5–15.5)
WBC: 5.2 10*3/uL (ref 4.0–10.5)

## 2016-02-04 LAB — BASIC METABOLIC PANEL
Anion gap: 11 (ref 5–15)
BUN: 34 mg/dL — AB (ref 6–20)
CHLORIDE: 97 mmol/L — AB (ref 101–111)
CO2: 24 mmol/L (ref 22–32)
CREATININE: 1.41 mg/dL — AB (ref 0.44–1.00)
Calcium: 8.6 mg/dL — ABNORMAL LOW (ref 8.9–10.3)
GFR calc Af Amer: 44 mL/min — ABNORMAL LOW (ref >60)
GFR calc non Af Amer: 38 mL/min — ABNORMAL LOW (ref >60)
GLUCOSE: 106 mg/dL — AB (ref 65–99)
POTASSIUM: 3.6 mmol/L (ref 3.5–5.1)
SODIUM: 132 mmol/L — AB (ref 135–145)

## 2016-02-04 LAB — GLUCOSE, CAPILLARY
GLUCOSE-CAPILLARY: 99 mg/dL (ref 65–99)
GLUCOSE-CAPILLARY: 120 mg/dL — AB (ref 65–99)
GLUCOSE-CAPILLARY: 126 mg/dL — AB (ref 65–99)
GLUCOSE-CAPILLARY: 69 mg/dL (ref 65–99)
GLUCOSE-CAPILLARY: 165 mg/dL — AB (ref 65–99)
Glucose-Capillary: 115 mg/dL — ABNORMAL HIGH (ref 65–99)

## 2016-02-04 LAB — HEPARIN LEVEL (UNFRACTIONATED): Heparin Unfractionated: 0.33 IU/mL (ref 0.30–0.70)

## 2016-02-04 LAB — MAGNESIUM: MAGNESIUM: 2.2 mg/dL (ref 1.7–2.4)

## 2016-02-04 LAB — PHOSPHORUS: Phosphorus: 4 mg/dL (ref 2.5–4.6)

## 2016-02-04 MED ORDER — PANTOPRAZOLE SODIUM 40 MG PO PACK
40.0000 mg | PACK | Freq: Every day | ORAL | Status: DC
  Administered 2016-02-04 – 2016-02-06 (×3): 40 mg
  Filled 2016-02-04 (×4): qty 20

## 2016-02-04 MED ORDER — DEXTROSE 50 % IV SOLN
INTRAVENOUS | Status: AC
  Administered 2016-02-04: 25 mL
  Filled 2016-02-04: qty 50

## 2016-02-04 NOTE — Progress Notes (Signed)
SUBJECTIVE:   65 y.o. female with a history of hx of CAD s/p CABG, HTN, diabetes, ischemic cardiomyopathy, combined systolic and diastolic CHF, Recent dx of breast cancer (plan to start chemo) moderate to severe mitral regurgitation, PAD status post prior renal artery stenting, HL, carotid stenosis status post R CEA, CKD, bipolar affective d/o who presented to [facility] ED for evaluation of SOB and chest pain  Renal function has improved slightly .  Has diuresed.  Denies any chest pain this am   OBJECTIVE:   Vitals:   Filed Vitals:   02/04/16 0600 02/04/16 0700 02/04/16 0738 02/04/16 0744  BP: 126/54 143/65 141/60   Pulse: 69 77    Temp:    99 F (37.2 C)  TempSrc:    Oral  Resp: 14 20 23    Height:      Weight:      SpO2: 100% 100%     I&O's:    Intake/Output Summary (Last 24 hours) at 02/04/16 [id number] Last data filed at 02/04/16 0600  Gross per 24 hour  Intake 1147.95 ml  Output   1725 ml  Net -577.05 ml   TELEMETRY: Reviewed telemetry pt in NSR:  PHYSICAL EXAM General: Intubated, nods head to questions Head:   Normal cephalic and atramatic  Lungs:  Coarse breath sounds bilaterally to auscultation. Heart:   HRRR S1 S2  No JVD.   Abdomen: abdomen soft and non-tender Msk:  Back normal,  Moves head Extremities:   No edema.   Neuro: Intubated Psych:  intubated Skin: No rash   LABS: Basic Metabolic Panel:  Recent Labs  02/03/16 0300 02/04/16 0233  NA 130* 132*  K 3.6 3.6  CL 95* 97*  CO2 21* 24  GLUCOSE 70 106*  BUN 27* 34*  CREATININE 1.63* 1.41*  CALCIUM 9.3 8.6*  MG 2.0 2.2  PHOS 3.0 4.0   Liver Function Tests:  Recent Labs  02/02/16 0700  AST 28  ALT 22  ALKPHOS 56  BILITOT 0.9  PROT 6.7  ALBUMIN 4.2   No results for input(s): LIPASE, AMYLASE in the last 72 hours. CBC:  Recent Labs  02/03/16 0300 02/04/16 0233  WBC 6.6 5.2  HGB 10.7* 9.9*  HCT 31.1* 29.4*  MCV 77.2* 79.2  PLT 117* 112*   Cardiac Enzymes:  Recent Labs   02/02/16 1159 02/03/16  TROPONINI 0.14* 1.61*   BNP: Invalid input(s): POCBNP D-Dimer: No results for input(s): DDIMER in the last 72 hours. Hemoglobin A1C:  Recent Labs  02/02/16 1159  HGBA1C 6.2*   Fasting Lipid Panel: No results for input(s): CHOL, HDL, LDLCALC, TRIG, CHOLHDL, LDLDIRECT in the last 72 hours. Thyroid Function Tests: No results for input(s): TSH, T4TOTAL, T3FREE, THYROIDAB in the last 72 hours.  Invalid input(s): FREET3 Anemia Panel: No results for input(s): VITAMINB12, FOLATE, FERRITIN, TIBC, IRON, RETICCTPCT in the last 72 hours. Coag Panel:   Lab Results  Component Value Date   INR 1.19 02/02/2016   INR 1.15 12/29/2015   INR 1.22 10/02/2015    RADIOLOGY: Dg Chest 2 View  02/02/2016  CLINICAL DATA:  Centralized chest pain radiating to LEFT arm since yesterday, shortness of breath, history coronary artery disease post MI and CABG, diabetes mellitus, hypertension EXAM: CHEST  2 VIEW COMPARISON:  12/29/2015 FINDINGS: Enlargement of cardiac silhouette post CABG. Pulmonary vascular congestion. Atherosclerotic calcification aorta. Improved pulmonary edema. No pleural effusion or pneumothorax. Bones demineralized. IMPRESSION: Mild pulmonary edema, improved since previous exam. Electronically Signed   By: Elta [place]  Thornton Papas M.D.   On: 02/02/2016 07:28   Dg Chest Port 1 View  02/04/2016  CLINICAL DATA:  65 year old female with a history of intubation. Head min 02/02/2016 with respiratory failure. EXAM: PORTABLE CHEST 1 VIEW COMPARISON:  02/03/2016, 02/02/2016 FINDINGS: Cardiomediastinal silhouette unchanged cardiomegaly. Surgical changes of prior median sternotomy and CABG. Calcifications of the aortic arch. Surgical staples project over the left heart border. Unchanged position of the endotracheal tube, which terminates suitably above the carina, measuring 3.3 cm. Overlying EKG leads. Gastric tube unchanged, terminating out of the field of view. Compared to the study of  02/02/2016 there has been improved airspace opacity bilateral lungs. Persisting mixed interstitial airspace opacity at the right medial base partially obscuring the right heart border. No pneumothorax.  No large pleural effusion. IMPRESSION: Endotracheal tube terminates suitably above the carina. Unchanged position of gastric tube. Improved airspace disease compared to the chest x-ray of 02/02/2016. Cardiomegaly Signed, Dulcy Fanny. Earleen Newport, DO [facility] Radiology Electronically Signed   By: Corrie Mckusick D.O.   On: 02/04/2016 06:56   Dg Chest Portable 1 View  02/03/2016  CLINICAL DATA:  Respiratory failure. EXAM: PORTABLE CHEST 1 VIEW COMPARISON:  02/02/2016. FINDINGS: Endotracheal tube and NG tube in stable position. Prior CABG. Cardiomegaly. Pulmonary vascular prominence with bilateral pulmonary infiltrates/ edema. Slight improvement from prior exam. IMPRESSION: 1. Lines and tubes stable position. 2. Prior CABG. Persistent cardiomegaly and bilateral pulmonary infiltrates/edema. Interim slight improvement from prior exam. Electronically Signed   By: [place]   On: 02/03/2016 07:05   Dg Chest Portable 1 View  02/02/2016  CLINICAL DATA:  Check endotracheal tube placement EXAM: PORTABLE CHEST 1 VIEW COMPARISON:  02/02/2016 FINDINGS: Endotracheal tube is been placed in lies approximately 2 cm above the carina. A nasogastric catheter is coiled within the stomach. Postsurgical changes are seen. Increasing perihilar densities are noted consistent with frank pulmonary edema. No sizable effusion is seen. No bony abnormality is noted. IMPRESSION: Tubes and lines in satisfactory position. Increasing pulmonary edema Electronically Signed   By: Inez Catalina M.D.   On: 02/02/2016 11:50      ASSESSMENT / PLAN:    1) Acute on chronic systolic heart failure:  Diuresed well.  Requiring less vent support.  Now with increased troponin and increased creatinine.  Known  CAD with disease in the circ territory.  Once she is extubated, we can consider high risk PCI- she would have to agree to the risks of the procedure.    she may be extubated soon.   Is currently on pressure support .  Pain free at present .  Following    Thayer Headings, MD  02/04/2016  9:38 AM

## 2016-02-04 NOTE — H&P (Signed)
PULMONARY / CRITICAL CARE MEDICINE   Name: Penny James MRN: [id number] DOB: 07-10-1951    ADMISSION DATE:  02/02/2016  REFERRING MD:  EDP PCP - Dr. 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  In ER she had marked respiratory distress.  She was treated with asa, nitro gtt, lasix IV. First troponin wnl. BNP 1635.  She continued to worsen despite attempts at bipap and was intubated.  PCCM called to admit.    SUBJECTIVE:  No issues overnight. (-) cp. diurescing well.   VITAL SIGNS: BP 143/65 mmHg  Pulse 77  Temp(Src) 98.6 F (37 C) (Oral)  Resp 20  Ht 5\' 3"  (1.6 m)  Wt 145 lb 11.6 oz (66.1 kg)  BMI 25.82 kg/m2  SpO2 100%  HEMODYNAMICS:    VENTILATOR SETTINGS: Vent Mode:  [-] PRVC FiO2 (%):  [30 %-40 %] 30 % Set Rate:  [14 bmp-18 bmp] 14 bmp Vt Set:  [460 mL] 460 mL PEEP:  [5 cmH20] 5 cmH20 Plateau Pressure:  [19 cmH20-20 cmH20] 19 cmH20  INTAKE / OUTPUT: I/O last 3 completed shifts: In: 1501 [I.V.:788; NG/GT:712.9] Out: 3415 [Urine:3415]  PHYSICAL EXAMINATION: General:  Chronically ill appearing female, NAD on vent  Neuro:  Sedated on propofol, RASS -2 HEENT:  Mm moist, ETT, POS JVD  Cardiovascular:  s1s2 rrr, tachy no m/r/g Lungs:  resps even non labored on vent, bibasilar crackles > less Abdomen:  Round, soft Musculoskeletal:  Warm and dry, less edema   LABS:  BMET  Recent Labs Lab 02/02/16 0700 02/02/16 1159 02/03/16 0300 02/04/16 0233  NA 125*  --  130* 132*  K 4.6  --  3.6 3.6  CL 90*  --  95* 97*  CO2 23  --  21* 24  BUN 23*  --  27* 34*  CREATININE 1.37* 1.73* 1.63* 1.41*   GLUCOSE 191*  --  70 106*    Electrolytes  Recent Labs Lab 02/02/16 0700 02/02/16 1159 02/03/16 0300 02/04/16 0233  CALCIUM 9.4  --  9.3 8.6*  MG  --  2.2 2.0 2.2  PHOS  --  5.4* 3.0 4.0    CBC  Recent Labs Lab 02/02/16 1159 02/03/16 0300 02/04/16 0233  WBC 8.8 6.6 5.2  HGB 10.9* 10.7* 9.9*  HCT 31.1* 31.1* 29.4*  PLT 100* 117* 112*    Coag's  Recent Labs Lab 02/02/16 0700  INR 1.19    Sepsis Markers No results for input(s): LATICACIDVEN, PROCALCITON, O2SATVEN in the last 168 hours.  ABG  Recent Labs Lab 02/02/16 1206 02/03/16 0420 02/03/16 1037  PHART 7.298* 7.596* 7.515*  PCO2ART 50.7* 21.5* 30.3*  PO2ART 326.0* 203* 205.0*    Liver Enzymes  Recent Labs Lab 02/02/16 0700  AST 28  ALT 22  ALKPHOS 56  BILITOT 0.9  ALBUMIN 4.2    Cardiac Enzymes  Recent Labs Lab 02/02/16 1159 02/03/16  TROPONINI 0.14* 1.61*    Glucose  Recent Labs Lab 02/03/16 0717 02/03/16 1121 02/03/16 1507 02/03/16 2044 02/03/16 2322 02/04/16 0319  GLUCAP 77 77 84 106* 115* 99    Imaging Dg Chest [facility]  1 View  02/04/2016  CLINICAL DATA:  65 year old female with a history of intubation. Head min 02/02/2016 with respiratory failure. EXAM: PORTABLE CHEST 1 VIEW COMPARISON:  02/03/2016, 02/02/2016 FINDINGS: Cardiomediastinal silhouette unchanged cardiomegaly. Surgical changes of prior median sternotomy and CABG. Calcifications of the aortic arch. Surgical staples project over the left heart border. Unchanged position of the endotracheal tube, which terminates suitably above the carina, measuring 3.3 cm. Overlying EKG leads. Gastric tube unchanged, terminating out of the field of view. Compared to the study of 02/02/2016 there has been improved airspace opacity bilateral lungs. Persisting mixed interstitial airspace opacity at the right medial base partially obscuring the right heart border. No pneumothorax.  No large pleural effusion. IMPRESSION: Endotracheal tube  terminates suitably above the carina. Unchanged position of gastric tube. Improved airspace disease compared to the chest x-ray of 02/02/2016. Cardiomegaly Signed, Dulcy Fanny. Earleen Newport, DO [facility] Radiology Electronically Signed   By: Corrie Mckusick D.O.   On: 02/04/2016 06:56    STUDIES:  CXC 04/06 > mild pulmonary edema.  CULTURES: None.  ANTIBIOTICS: None.  SIGNIFICANT EVENTS: 4/6 admitted for sob,cp. Intubated. NSTEMI.   LINES/TUBES: ETT 04/06 >  DISCUSSION: 65 y.o. 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  Admitted 04/06 with SOB secondary to CHF exacerbation.  Concern for decompensated heart failure.  ASSESSMENT / PLAN:  CARDIOVASCULAR A:  Acute on chronic combined systolic and diastolic heart failure - EF 20% with diffuse hypokinesis (01/2016) NSTEMI Hx HLD, CAD s/p CABG 2002, ICM, mitral regurg. P:  Diuresis as BP and SCr permit. Cont heparin drip, nitro drip.  Cards following > plan to do LHC in am Continue outpatient ASA, plavix.   PULMONARY A: Acute hypoxic respiratory failure - presumed due to CHF exacerbation. Failed BiPAP in ED and required intubation. Pulmonary edema. P:  8cc/kg, wean Fio2 as able, increase PEEP if unable to get to 0.60 VAP prevention measures. SBT in AM if able. Diuresis as BP and SCr permit. Albuterol PRN. Rpt abg (alkalemia on 4/6)  RENAL A:  Hyponatremia - likely due to hypervolemia. AoCKD. P:  KVO fluids. diuresce  GASTROINTESTINAL A:  GERD. Obesity. Nutrition. P:  SUP: Pantoprazole. 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 Lindi Adie.) - recommended adjuvant chemo, radiation. Oncology recommending  breast conserving surgery with sentinel lymph node bx. Pancytopenia, ? Due to malignancy vs acute illness VTE Prophylaxis. P:  SCD's / heparin. CBC in AM. Will notify oncology of admission.  INFECTIOUS A:  No indication of infection currently. P:  Monitor clinically. Will consider empiric abx if she declines in any way  ENDOCRINE A:  DM.  P:  SSI. Assess Hgb A1c.  NEUROLOGIC A:  Acute metabolic encephalopathy - due to sedation. Hx bipolar affective disorder, peripheral neuropathy, carotid artery stenosis. P:  Sedation: Fentanyl gtt / Versed PRN. RASS goal: 0 to -1. Daily WUA.   Family updated: None.  Interdisciplinary Family Meeting v Palliative Care Meeting: Due by: 04/13.  Critical  Care time on this pt today : 30 minutes.   Monica Becton, MD 02/04/2016, 7:40 AM [facility] Pager [phone] After 3 pm or if no answer, call [phone]

## 2016-02-04 NOTE — Progress Notes (Signed)
ANTICOAGULATION CONSULT NOTE - Follow Up Consult  Pharmacy Consult:  Heparin Indication: chest pain/ACS  Allergies  Allergen Reactions  . Penicillins Rash    Has patient had a PCN reaction causing immediate rash, facial/tongue/throat swelling, SOB or lightheadedness with hypotension: Yes Has patient had a PCN reaction causing severe rash involving mucus membranes or skin necrosis: Yes Has patient had a PCN reaction that required hospitalization Yes- admitted to hospital Has patient had a PCN reaction occurring within the last 10 years: No, childhood allergy If all of the above answers are "NO", then may proceed with Cephalosporin use.   . Protamine     Severe hypotension Internal bleeding around heart  . Amlodipine Swelling    Causes swelling in higher dosage    Patient Measurements: Height: 5\' 3"  (160 cm) Weight: 145 lb 11.6 oz (66.1 kg) IBW/kg (Calculated) : 52.4 Heparin Dosing Weight: 67 kg  Vital Signs: Temp: 99 F (37.2 C) (04/08 0744) Temp Source: Oral (04/08 0744) BP: 141/60 mmHg (04/08 0738) Pulse Rate: 77 (04/08 0700)  Labs:  Recent Labs  02/02/16 0700 02/02/16 1159 02/03/16 02/03/16 0300 02/03/16 1038 02/03/16 1922 02/04/16 0233  HGB 10.9* 10.9*  --  10.7*  --   --  9.9*  HCT 31.6* 31.1*  --  31.1*  --   --  29.4*  PLT 135* 100*  --  117*  --   --  112*  LABPROT 15.3*  --   --   --   --   --   --   INR 1.19  --   --   --   --   --   --   HEPARINUNFRC  --   --   --   --  0.80* 0.61 0.33  CREATININE 1.37* 1.73*  --  1.63*  --   --  1.41*  TROPONINI  --  0.14* 1.61*  --   --   --   --     Estimated Creatinine Clearance: 35.4 mL/min (by C-G formula based on Cr of 1.41).      Assessment: 10 YOF continues on IV heparin for NSTEMI.  Heparin level therapeutic; no bleeding reported.     Goal of Therapy:  Heparin level 0.3-0.7 units/ml Monitor platelets by anticoagulation protocol: Yes    Plan:  - Continue heparin gtt at 700 units/hr - Daily HL /  CBC - F/U start ACEi/ARB once renal fxn improves    Delmus Warwick D. Mina Marble, PharmD, BCPS Pager:  [phone] 02/04/2016, 8:47 AM

## 2016-02-04 NOTE — Progress Notes (Signed)
Pharmacist Heart Failure Core Measure Documentation  Assessment: Penny James has an EF documented as 20% on 02/03/16 by ECHO.  Rationale: Heart failure patients with left ventricular systolic dysfunction (LVSD) and an EF < 40% should be prescribed an angiotensin converting enzyme inhibitor (ACEI) or angiotensin receptor blocker (ARB) at discharge unless a contraindication is documented in the medical record.  This patient is not currently on an ACEI or ARB for HF.  This note is being placed in the record in order to provide documentation that a contraindication to the use of these agents is present for this encounter.  ACE Inhibitor or Angiotensin Receptor Blocker is contraindicated (specify all that apply)  []   ACEI allergy AND ARB allergy []   Angioedema []   Moderate or severe aortic stenosis []   Hyperkalemia []   Hypotension []   Renal artery stenosis [x]   Worsening renal function, preexisting renal disease or dysfunction.  F/U starting ACEi/ARB once renal fxn at baseline.   Saphyre Cillo D. Mina Marble, PharmD, BCPS Pager:  [phone] 02/04/2016, 12:58 PM

## 2016-02-04 NOTE — Progress Notes (Signed)
Unable to obtain ABG sample from patient. Two RT attempt. No complications. RT will continue to monitor.

## 2016-02-05 LAB — CBC
HCT: 30.3 % — ABNORMAL LOW (ref 36.0–46.0)
HEMATOCRIT: 30.3 % — AB (ref 36.0–46.0)
HEMOGLOBIN: 9.8 g/dL — AB (ref 12.0–15.0)
Hemoglobin: 10.1 g/dL — ABNORMAL LOW (ref 12.0–15.0)
MCH: 26.8 pg (ref 26.0–34.0)
MCH: 26.1 pg (ref 26.0–34.0)
MCHC: 33.3 g/dL (ref 30.0–36.0)
MCHC: 32.3 g/dL (ref 30.0–36.0)
MCV: 80.4 fL (ref 78.0–100.0)
MCV: 80.8 fL (ref 78.0–100.0)
PLATELETS: 120 10*3/uL — AB (ref 150–400)
PLATELETS: 121 10*3/uL — AB (ref 150–400)
RBC: 3.77 MIL/uL — ABNORMAL LOW (ref 3.87–5.11)
RBC: 3.75 MIL/uL — AB (ref 3.87–5.11)
RDW: 15.8 % — AB (ref 11.5–15.5)
RDW: 15.9 % — ABNORMAL HIGH (ref 11.5–15.5)
WBC: 9.3 10*3/uL (ref 4.0–10.5)
WBC: 6.5 10*3/uL (ref 4.0–10.5)

## 2016-02-05 LAB — URINALYSIS, ROUTINE W REFLEX MICROSCOPIC
GLUCOSE, UA: NEGATIVE mg/dL
Ketones, ur: 15 mg/dL — AB
Nitrite: POSITIVE — AB
SPECIFIC GRAVITY, URINE: 1.015 (ref 1.005–1.030)
pH: 6 (ref 5.0–8.0)

## 2016-02-05 LAB — BASIC METABOLIC PANEL
ANION GAP: 12 (ref 5–15)
BUN: 33 mg/dL — ABNORMAL HIGH (ref 6–20)
CALCIUM: 9.3 mg/dL (ref 8.9–10.3)
CHLORIDE: 99 mmol/L — AB (ref 101–111)
CO2: 26 mmol/L (ref 22–32)
Creatinine, Ser: 1.31 mg/dL — ABNORMAL HIGH (ref 0.44–1.00)
GFR calc non Af Amer: 41 mL/min — ABNORMAL LOW (ref >60)
GFR, EST AFRICAN AMERICAN: 48 mL/min — AB (ref >60)
Glucose, Bld: 133 mg/dL — ABNORMAL HIGH (ref 65–99)
POTASSIUM: 3.5 mmol/L (ref 3.5–5.1)
Sodium: 137 mmol/L (ref 135–145)

## 2016-02-05 LAB — URINE MICROSCOPIC-ADD ON

## 2016-02-05 LAB — HEPARIN LEVEL (UNFRACTIONATED)
Heparin Unfractionated: 0.29 IU/mL — ABNORMAL LOW (ref 0.30–0.70)
Heparin Unfractionated: 0.38 IU/mL (ref 0.30–0.70)

## 2016-02-05 LAB — GLUCOSE, CAPILLARY
GLUCOSE-CAPILLARY: 143 mg/dL — AB (ref 65–99)
GLUCOSE-CAPILLARY: 148 mg/dL — AB (ref 65–99)
GLUCOSE-CAPILLARY: 130 mg/dL — AB (ref 65–99)
Glucose-Capillary: 122 mg/dL — ABNORMAL HIGH (ref 65–99)
Glucose-Capillary: 163 mg/dL — ABNORMAL HIGH (ref 65–99)
Glucose-Capillary: 176 mg/dL — ABNORMAL HIGH (ref 65–99)

## 2016-02-05 LAB — MAGNESIUM: Magnesium: 2.2 mg/dL (ref 1.7–2.4)

## 2016-02-05 LAB — PHOSPHORUS: PHOSPHORUS: 3.3 mg/dL (ref 2.5–4.6)

## 2016-02-05 MED ORDER — SODIUM CHLORIDE 0.9 % WEIGHT BASED INFUSION
3.0000 mL/kg/h | INTRAVENOUS | Status: AC
Start: 2016-02-06 — End: 2016-02-06
  Administered 2016-02-06: 3 mL/kg/h via INTRAVENOUS

## 2016-02-05 MED ORDER — SODIUM CHLORIDE 0.9 % WEIGHT BASED INFUSION: 1.0000 mL/kg/h | INTRAVENOUS | Status: DC

## 2016-02-05 MED ORDER — ASPIRIN 81 MG PO CHEW
81.0000 mg | CHEWABLE_TABLET | ORAL | Status: AC
  Administered 2016-02-06: 81 mg via ORAL

## 2016-02-05 MED ORDER — SODIUM CHLORIDE 0.9% FLUSH
3.0000 mL | Freq: Two times a day (BID) | INTRAVENOUS | Status: DC
  Administered 2016-02-05 – 2016-02-07 (×3): 3 mL via INTRAVENOUS

## 2016-02-05 MED ORDER — SODIUM CHLORIDE 0.9% FLUSH: 3.0000 mL | INTRAVENOUS | Status: DC | PRN

## 2016-02-05 MED ORDER — SODIUM CHLORIDE 0.9 % IV SOLN: 250.0000 mL | INTRAVENOUS | Status: DC | PRN

## 2016-02-05 NOTE — Progress Notes (Signed)
PULMONARY / CRITICAL CARE MEDICINE   Name: Penny James MRN: [id number] DOB: August 25, 1951    ADMISSION DATE:  02/02/2016  REFERRING MD:  EDP PCP - Dr. 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  In ER she had marked respiratory distress.  She was treated with asa, nitro gtt, lasix IV. First troponin wnl. BNP 1635.  She continued to worsen despite attempts at bipap and was intubated.  PCCM called to admit.    SUBJECTIVE:  No issues overnight. Pt is diuresing well. She is fully alert   VITAL SIGNS: BP 128/63 mmHg  Pulse 71  Temp(Src) 98.6 F (37 C) (Oral)  Resp 24  Ht 5\' 3"  (1.6 m)  Wt 141 lb 1.5 oz (64 kg)  BMI 25.00 kg/m2  SpO2 100%  HEMODYNAMICS:    VENTILATOR SETTINGS: Vent Mode:  [-] CPAP;PSV FiO2 (%):  [30 %-40 %] 30 % Set Rate:  [14 bmp] 14 bmp Vt Set:  [460 mL] 460 mL PEEP:  [5 cmH20] 5 cmH20 Pressure Support:  [10 cmH20] 10 cmH20 Plateau Pressure:  [13 cmH20-23 cmH20] 15 cmH20  INTAKE / OUTPUT: I/O last 3 completed shifts: In: 2214 [I.V.:909; NG/GT:1305] Out: 3085 [Urine:3085]  PHYSICAL EXAMINATION: General:  Chronically ill appearing female, NAD on vent  Neuro:  Alert and following commands - gave up a thumbs up when asked how she was doing  Cardiovascular:  s1s2 rrr, tachy no m/r/g Lungs:  resps even non labored on vent, bibasilar crackles > less Abdomen:  Round, soft, +BS Musculoskeletal:  Warm and dry, no pitting edema  LABS:  BMET  Recent Labs Lab 02/02/16 0700 02/02/16 1159 02/03/16 0300 02/04/16 0233  NA 125*  --  130* 132*  K 4.6  --  3.6 3.6  CL 90*  --  95* 97*  CO2  23  --  21* 24  BUN 23*  --  27* 34*  CREATININE 1.37* 1.73* 1.63* 1.41*  GLUCOSE 191*  --  70 106*    Electrolytes  Recent Labs Lab 02/02/16 0700 02/02/16 1159 02/03/16 0300 02/04/16 0233  CALCIUM 9.4  --  9.3 8.6*  MG  --  2.2 2.0 2.2  PHOS  --  5.4* 3.0 4.0    CBC  Recent Labs Lab 02/03/16 0300 02/04/16 0233 02/05/16 0233  WBC 6.6 5.2 9.3  HGB 10.7* 9.9* 10.1*  HCT 31.1* 29.4* 30.3*  PLT 117* 112* 120*    Coag's  Recent Labs Lab 02/02/16 0700  INR 1.19    Sepsis Markers No results for input(s): LATICACIDVEN, PROCALCITON, O2SATVEN in the last 168 hours.  ABG  Recent Labs Lab 02/02/16 1206 02/03/16 0420 02/03/16 1037  PHART 7.298* 7.596* 7.515*  PCO2ART 50.7* 21.5* 30.3*  PO2ART 326.0* 203* 205.0*    Liver Enzymes  Recent Labs Lab 02/02/16 0700  AST 28  ALT 22  ALKPHOS 56  BILITOT 0.9  ALBUMIN 4.2    Cardiac Enzymes  Recent Labs Lab 02/02/16 1159 02/03/16  TROPONINI 0.14* 1.61*    Glucose  Recent Labs Lab 02/04/16 0748 02/04/16 1131 02/04/16 1557 02/04/16 1920 02/04/16 2338 02/05/16 [id number]  GLUCAP 120* 126* 69 165* 163* 143*    Imaging No results found.  STUDIES:  CXC 04/06 > mild pulmonary edema.  CULTURES: None.  ANTIBIOTICS: None.  SIGNIFICANT EVENTS: 4/6 admitted for sob,cp. Intubated. NSTEMI.   LINES/TUBES: ETT 04/06 >  DISCUSSION: 65 y.o. 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  Admitted 04/06 with SOB secondary to CHF exacerbation.  Concern for decompensated heart failure.  ASSESSMENT / PLAN:  CARDIOVASCULAR A:  Acute on chronic combined systolic and diastolic heart failure - EF 20% with diffuse hypokinesis (01/2016) NSTEMI Hx HLD, CAD s/p CABG 2002, ICM, mitral regurg. P:  Patient has been diuresing  well- will continue Lasix IV 40 mg BID. Her creatinine is stable.  Cont heparin drip, nitro drip.  Cards following > plan to do [facility] Monday morning  Continue outpatient ASA, plavix.   PULMONARY A: Acute hypoxic respiratory failure - presumed due to CHF exacerbation. Failed BiPAP in ED and required intubation. Pulmonary edema. P:  Continue intubation- plan for [facility] tomorrow by cardiology  VAP prevention measures. Diuresis as BP and SCr permit. Albuterol PRN.  RENAL A:  Hyponatremia - likely due to hypervolemia. AoCKD. P:  KVO fluids. diuresce  GASTROINTESTINAL A:  GERD. Obesity. Nutrition. P:  SUP: Pantoprazole. 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 Lindi Adie.) - recommended adjuvant chemo, radiation. Oncology recommending breast conserving surgery with sentinel lymph node bx. Pancytopenia, ? Due to malignancy vs acute illness VTE Prophylaxis. P:  SCD's / heparin.   INFECTIOUS A:  No indication of infection currently. P:  Monitor clinically. Will consider empiric abx if she declines in any way  ENDOCRINE A:  DM.  P:  SSI. Assess Hgb A1c.  NEUROLOGIC A:  Acute metabolic encephalopathy - due to sedation. Hx bipolar affective disorder, peripheral neuropathy, carotid artery stenosis. P:  Sedation: Fentanyl gtt / Versed PRN- has not needed versed  RASS goal: 0 to -1. Daily WUA.   Signed Vernie Murders PGY1 Internal Medicine

## 2016-02-05 NOTE — Progress Notes (Signed)
Pt placed back on full vent support due to increased RR 

## 2016-02-05 NOTE — Progress Notes (Addendum)
SUBJECTIVE:   65 y.o. female with a history of hx of CAD s/p CABG, HTN, diabetes, ischemic cardiomyopathy, combined systolic and diastolic CHF, Recent dx of breast cancer (plan to start chemo) moderate to severe mitral regurgitation, PAD status post prior renal artery stenting, HL, carotid stenosis status post R CEA, CKD, bipolar affective d/o who presented to [facility] ED for evaluation of SOB and chest pain  Renal function has improved slightly .  Has diuresed.  Denies any chest pain this am   OBJECTIVE:   Vitals:   Filed Vitals:   02/05/16 0730 02/05/16 0734 02/05/16 0800 02/05/16 0900  BP:  128/63 118/58 118/59  Pulse:  71 71 65  Temp: 98.6 F (37 C)     TempSrc: Oral     Resp:  24 12 14   Height:      Weight:      SpO2:  100% 100% 100%   I&O's:    Intake/Output Summary (Last 24 hours) at 02/05/16 1029 Last data filed at 02/05/16 0900  Gross per 24 hour  Intake 1564.5 ml  Output   2005 ml  Net -440.5 ml   TELEMETRY: Reviewed telemetry pt in NSR:  PHYSICAL EXAM General: Intubated, nods head to questions Head:   Normal cephalic and atramatic  Lungs:  Coarse breath sounds bilaterally to auscultation. Heart:   HRRR S1 S2  No JVD.   Abdomen: abdomen soft and non-tender Msk:  Back normal,  Moves head Extremities:   No edema.   Neuro: Intubated Psych:  intubated Skin: No rash   LABS: Basic Metabolic Panel:  Recent Labs  02/03/16 0300 02/04/16 0233  NA 130* 132*  K 3.6 3.6  CL 95* 97*  CO2 21* 24  GLUCOSE 70 106*  BUN 27* 34*  CREATININE 1.63* 1.41*  CALCIUM 9.3 8.6*  MG 2.0 2.2  PHOS 3.0 4.0   Liver Function Tests: No results for input(s): AST, ALT, ALKPHOS, BILITOT, PROT, ALBUMIN in the last 72 hours. No results for input(s): LIPASE, AMYLASE in the last 72 hours. CBC:  Recent Labs  02/04/16 0233 02/05/16 0233  WBC 5.2 9.3  HGB 9.9* 10.1*  HCT 29.4* 30.3*  MCV 79.2 80.4  PLT 112* 120*   Cardiac Enzymes:  Recent Labs  02/02/16 1159 02/03/16    TROPONINI 0.14* 1.61*   BNP: Invalid input(s): POCBNP D-Dimer: No results for input(s): DDIMER in the last 72 hours. Hemoglobin A1C:  Recent Labs  02/02/16 1159  HGBA1C 6.2*   Fasting Lipid Panel: No results for input(s): CHOL, HDL, LDLCALC, TRIG, CHOLHDL, LDLDIRECT in the last 72 hours. Thyroid Function Tests: No results for input(s): TSH, T4TOTAL, T3FREE, THYROIDAB in the last 72 hours.  Invalid input(s): FREET3 Anemia Panel: No results for input(s): VITAMINB12, FOLATE, FERRITIN, TIBC, IRON, RETICCTPCT in the last 72 hours. Coag Panel:   Lab Results  Component Value Date   INR 1.19 02/02/2016   INR 1.15 12/29/2015   INR 1.22 10/02/2015    RADIOLOGY: Dg Chest 2 View  02/02/2016  CLINICAL DATA:  Centralized chest pain radiating to LEFT arm since yesterday, shortness of breath, history coronary artery disease post MI and CABG, diabetes mellitus, hypertension EXAM: CHEST  2 VIEW COMPARISON:  12/29/2015 FINDINGS: Enlargement of cardiac silhouette post CABG. Pulmonary vascular congestion. Atherosclerotic calcification aorta. Improved pulmonary edema. No pleural effusion or pneumothorax. Bones demineralized. IMPRESSION: Mild pulmonary edema, improved since previous exam. Electronically Signed   By: Lavonia Dana M.D.   On: 02/02/2016 07:28  Dg Chest Port 1 View  02/04/2016  CLINICAL DATA:  65 year old female with a history of intubation. Head min 02/02/2016 with respiratory failure. EXAM: PORTABLE CHEST 1 VIEW COMPARISON:  02/03/2016, 02/02/2016 FINDINGS: Cardiomediastinal silhouette unchanged cardiomegaly. Surgical changes of prior median sternotomy and CABG. Calcifications of the aortic arch. Surgical staples project over the left heart border. Unchanged position of the endotracheal tube, which terminates suitably above the carina, measuring 3.3 cm. Overlying EKG leads. Gastric tube unchanged, terminating out of the field of view. Compared to the study of 02/02/2016 there has been  improved airspace opacity bilateral lungs. Persisting mixed interstitial airspace opacity at the right medial base partially obscuring the right heart border. No pneumothorax.  No large pleural effusion. IMPRESSION: Endotracheal tube terminates suitably above the carina. Unchanged position of gastric tube. Improved airspace disease compared to the chest x-ray of 02/02/2016. Cardiomegaly Signed, Dulcy Fanny. Earleen Newport, DO [facility] Electronically Signed   By: Corrie Mckusick D.O.   On: 02/04/2016 06:56   Dg Chest Portable 1 View  02/03/2016  CLINICAL DATA:  Respiratory failure. EXAM: PORTABLE CHEST 1 VIEW COMPARISON:  02/02/2016. FINDINGS: Endotracheal tube and NG tube in stable position. Prior CABG. Cardiomegaly. Pulmonary vascular prominence with bilateral pulmonary infiltrates/ edema. Slight improvement from prior exam. IMPRESSION: 1. Lines and tubes stable position. 2. Prior CABG. Persistent cardiomegaly and bilateral pulmonary infiltrates/edema. Interim slight improvement from prior exam. Electronically Signed   By: [place]   On: 02/03/2016 07:05   Dg Chest Portable 1 View  02/02/2016  CLINICAL DATA:  Check endotracheal tube placement EXAM: PORTABLE CHEST 1 VIEW COMPARISON:  02/02/2016 FINDINGS: Endotracheal tube is been placed in lies approximately 2 cm above the carina. A nasogastric catheter is coiled within the stomach. Postsurgical changes are seen. Increasing perihilar densities are noted consistent with frank pulmonary edema. No sizable effusion is seen. No bony abnormality is noted. IMPRESSION: Tubes and lines in satisfactory position. Increasing pulmonary edema Electronically Signed   By: Inez Catalina M.D.   On: 02/02/2016 11:50      ASSESSMENT / PLAN:    1) Acute on chronic systolic heart failure:  Diuresed well.  Requiring less vent support.  Now with increased troponin and increased creatinine.  Known CAD with disease in the circ  territory.  Once she is extubated, we can consider high risk PCI- she would have to agree to the risks of the procedure.   2. CAD :   She had more CP yesterday  I have reviewed the angiograms.  She has significant diffuse CAD.  Has a very tight distal LAD stenosis that I suspect is the culprit.   It would have to be approached via the LIMA graft. She has diffuse moderate - severe CAD involving the LM, LAD and LCx.   I have talked about the procedure with her son Letitia Libra [phone])  He agrees with the procedure  Plan is for cath tomorrow . Discussed with pulmonary , they would prefer that we keep her on the vent for now until after the cath - concerned about increased work up breathing causing angina .    Thayer Headings, MD  02/05/2016  10:29 AM

## 2016-02-05 NOTE — Progress Notes (Signed)
Pt placed back on full vent support due to decreased patient effort.

## 2016-02-05 NOTE — Progress Notes (Signed)
Pt with gross hematuria, heparin stopped, SCds placed  Will check cbc at 1800.

## 2016-02-05 NOTE — Progress Notes (Signed)
ANTICOAGULATION CONSULT NOTE - Follow Up Consult  Pharmacy Consult:  Heparin Indication: chest pain/ACS  Allergies  Allergen Reactions  . Penicillins Rash    Has patient had a PCN reaction causing immediate rash, facial/tongue/throat swelling, SOB or lightheadedness with hypotension: Yes Has patient had a PCN reaction causing severe rash involving mucus membranes or skin necrosis: Yes Has patient had a PCN reaction that required hospitalization Yes- admitted to hospital Has patient had a PCN reaction occurring within the last 10 years: No, childhood allergy If all of the above answers are "NO", then may proceed with Cephalosporin use.   . Protamine     Severe hypotension Internal bleeding around heart  . Amlodipine Swelling    Causes swelling in higher dosage    Patient Measurements: Height: 5\' 3"  (160 cm) Weight: 141 lb 1.5 oz (64 kg) IBW/kg (Calculated) : 52.4 Heparin Dosing Weight: 67 kg  Vital Signs: Temp: 99.2 F (37.3 C) (04/08 2344) Temp Source: Oral (04/08 2344) BP: 142/67 mmHg (04/08 1900) Pulse Rate: 83 (04/08 1900)  Labs:  Recent Labs  02/02/16 0700 02/02/16 1159 02/03/16 02/03/16 0300  02/03/16 1922 02/04/16 0233 02/05/16 0233 02/05/16 0235  HGB 10.9* 10.9*  --  10.7*  --   --  9.9* 10.1*  --   HCT 31.6* 31.1*  --  31.1*  --   --  29.4* 30.3*  --   PLT 135* 100*  --  117*  --   --  112* 120*  --   LABPROT 15.3*  --   --   --   --   --   --   --   --   INR 1.19  --   --   --   --   --   --   --   --   HEPARINUNFRC  --   --   --   --   < > 0.61 0.33  --  0.29*  CREATININE 1.37* 1.73*  --  1.63*  --   --  1.41*  --   --   TROPONINI  --  0.14* 1.61*  --   --   --   --   --   --   < > = values in this interval not displayed.  Estimated Creatinine Clearance: 34.8 mL/min (by C-G formula based on Cr of 1.41).  Assessment: 76 YOF continues on IV heparin for NSTEMI. Heparin level therapeutic now down to slightly subtherapeutic on 700 units/hr. CBC stable.  No issues with line or bleeding reported per RN.  Goal of Therapy:  Heparin level 0.3-0.7 units/ml Monitor platelets by anticoagulation protocol: Yes   Plan:  Increase heparin gtt to 850 units/hr F/u 6 hr heparin level  Sherlon Handing, PharmD, BCPS Clinical pharmacist, pager [phone] 02/05/2016, 4:05 AM

## 2016-02-05 NOTE — Progress Notes (Signed)
ANTICOAGULATION CONSULT NOTE - Follow Up Consult  Pharmacy Consult:  Heparin Indication: chest pain/ACS  Allergies  Allergen Reactions  . Penicillins Rash    Has patient had a PCN reaction causing immediate rash, facial/tongue/throat swelling, SOB or lightheadedness with hypotension: Yes Has patient had a PCN reaction causing severe rash involving mucus membranes or skin necrosis: Yes Has patient had a PCN reaction that required hospitalization Yes- admitted to hospital Has patient had a PCN reaction occurring within the last 10 years: No, childhood allergy If all of the above answers are "NO", then may proceed with Cephalosporin use.   . Protamine     Severe hypotension Internal bleeding around heart  . Amlodipine Swelling    Causes swelling in higher dosage    Patient Measurements: Height: 5\' 3"  (160 cm) Weight: 141 lb 1.5 oz (64 kg) IBW/kg (Calculated) : 52.4 Heparin Dosing Weight: 67 kg  Vital Signs: Temp: 98.6 F (37 C) (04/09 1202) Temp Source: Oral (04/09 1202) BP: 115/59 mmHg (04/09 1108) Pulse Rate: 74 (04/09 1108)  Labs:  Recent Labs  02/03/16  02/03/16 0300  02/04/16 0233 02/05/16 0233 02/05/16 0235 02/05/16 1007  HGB  --   < > 10.7*  --  9.9* 10.1*  --   --   HCT  --   --  31.1*  --  29.4* 30.3*  --   --   PLT  --   --  117*  --  112* 120*  --   --   HEPARINUNFRC  --   --   --   < > 0.33  --  0.29* 0.38  CREATININE  --   --  1.63*  --  1.41*  --   --  1.31*  TROPONINI 1.61*  --   --   --   --   --   --   --   < > = values in this interval not displayed.  Estimated Creatinine Clearance: 37.5 mL/min (by C-G formula based on Cr of 1.31).      Assessment: 65 YOF continues on IV heparin for NSTEMI.  Heparin level therapeutic; no bleeding reported.    Goal of Therapy:  Heparin level 0.3-0.7 units/ml Monitor platelets by anticoagulation protocol: Yes    Plan:  - Continue heparin gtt at 850 units/hr - Daily HL / CBC - F/U start ACEi/ARB once  renal fxn improves  - F/U post cath in AM   Tearah Saulsbury D. Mina Marble, PharmD, BCPS Pager:  [phone] 02/05/2016, 12:51 PM

## 2016-02-06 ENCOUNTER — Encounter (HOSPITAL_COMMUNITY): Payer: Self-pay | Admitting: Cardiology

## 2016-02-06 ENCOUNTER — Ambulatory Visit (HOSPITAL_COMMUNITY): Payer: Medicare Other

## 2016-02-06 ENCOUNTER — Encounter (HOSPITAL_COMMUNITY)
Admission: EM | Disposition: A | Discharge status: Hospice | Payer: Self-pay | Source: Home / Self Care | Attending: Emergency Medicine

## 2016-02-06 ENCOUNTER — Inpatient Hospital Stay (HOSPITAL_COMMUNITY): Payer: Medicare Other

## 2016-02-06 DIAGNOSIS — Z978 Presence of other specified devices: Secondary | ICD-10-CM | POA: Insufficient documentation

## 2016-02-06 DIAGNOSIS — R7989 Other specified abnormal findings of blood chemistry: Secondary | ICD-10-CM

## 2016-02-06 DIAGNOSIS — R778 Other specified abnormalities of plasma proteins: Secondary | ICD-10-CM

## 2016-02-06 DIAGNOSIS — E782 Mixed hyperlipidemia: Secondary | ICD-10-CM

## 2016-02-06 DIAGNOSIS — R079 Chest pain, unspecified: Secondary | ICD-10-CM

## 2016-02-06 DIAGNOSIS — I255 Ischemic cardiomyopathy: Secondary | ICD-10-CM

## 2016-02-06 DIAGNOSIS — I10 Essential (primary) hypertension: Secondary | ICD-10-CM

## 2016-02-06 DIAGNOSIS — Z789 Other specified health status: Secondary | ICD-10-CM

## 2016-02-06 HISTORY — DX: Other specified abnormal findings of blood chemistry: R79.89

## 2016-02-06 HISTORY — DX: Other specified abnormalities of plasma proteins: R77.8

## 2016-02-06 LAB — GLUCOSE, CAPILLARY
GLUCOSE-CAPILLARY: 171 mg/dL — AB (ref 65–99)
GLUCOSE-CAPILLARY: 106 mg/dL — AB (ref 65–99)
GLUCOSE-CAPILLARY: 145 mg/dL — AB (ref 65–99)
GLUCOSE-CAPILLARY: 112 mg/dL — AB (ref 65–99)
Glucose-Capillary: 161 mg/dL — ABNORMAL HIGH (ref 65–99)
Glucose-Capillary: 142 mg/dL — ABNORMAL HIGH (ref 65–99)

## 2016-02-06 LAB — BASIC METABOLIC PANEL
Anion gap: 11 (ref 5–15)
BUN: 38 mg/dL — AB (ref 6–20)
CALCIUM: 9 mg/dL (ref 8.9–10.3)
CO2: 27 mmol/L (ref 22–32)
CREATININE: 1.27 mg/dL — AB (ref 0.44–1.00)
Chloride: 100 mmol/L — ABNORMAL LOW (ref 101–111)
GFR calc Af Amer: 49 mL/min — ABNORMAL LOW (ref >60)
GFR, EST NON AFRICAN AMERICAN: 43 mL/min — AB (ref >60)
Glucose, Bld: 162 mg/dL — ABNORMAL HIGH (ref 65–99)
POTASSIUM: 3.4 mmol/L — AB (ref 3.5–5.1)
SODIUM: 138 mmol/L (ref 135–145)

## 2016-02-06 LAB — CBC
HCT: 27.5 % — ABNORMAL LOW (ref 36.0–46.0)
Hemoglobin: 9.1 g/dL — ABNORMAL LOW (ref 12.0–15.0)
MCH: 26.7 pg (ref 26.0–34.0)
MCHC: 33.1 g/dL (ref 30.0–36.0)
MCV: 80.6 fL (ref 78.0–100.0)
PLATELETS: 118 10*3/uL — AB (ref 150–400)
RBC: 3.41 MIL/uL — AB (ref 3.87–5.11)
RDW: 16 % — AB (ref 11.5–15.5)
WBC: 7.5 10*3/uL (ref 4.0–10.5)

## 2016-02-06 LAB — HEPARIN LEVEL (UNFRACTIONATED): Heparin Unfractionated: <0.1 IU/mL — ABNORMAL LOW (ref 0.30–0.70)

## 2016-02-06 SURGERY — LEFT HEART CATH AND CORS/GRAFTS ANGIOGRAPHY
Anesthesia: LOCAL

## 2016-02-06 MED ORDER — FUROSEMIDE 10 MG/ML IJ SOLN
60.0000 mg | Freq: Two times a day (BID) | INTRAMUSCULAR | Status: DC
  Administered 2016-02-06 – 2016-02-08 (×4): 60 mg via INTRAVENOUS
  Filled 2016-02-06 (×6): qty 6

## 2016-02-06 MED ORDER — ROSUVASTATIN CALCIUM 40 MG PO TABS
40.0000 mg | ORAL_TABLET | Freq: Every day | ORAL | Status: DC
  Administered 2016-02-06 – 2016-02-10 (×5): 40 mg via ORAL
  Filled 2016-02-06 (×2): qty 1
  Filled 2016-02-06: qty 4
  Filled 2016-02-06: qty 1
  Filled 2016-02-06 (×2): qty 4

## 2016-02-06 MED ORDER — POTASSIUM CHLORIDE 10 MEQ/100ML IV SOLN
10.0000 meq | INTRAVENOUS | Status: AC
  Administered 2016-02-06 (×3): 10 meq via INTRAVENOUS
  Filled 2016-02-06 (×3): qty 100

## 2016-02-06 NOTE — Procedures (Signed)
Extubation Procedure Note  Patient Details:   Name: Penny James DOB: 12-24-1950 MRN: [id number]   Airway Documentation:     Evaluation  O2 sats: stable throughout Complications: No apparent complications Patient did tolerate procedure well. Bilateral Breath Sounds: Clear   Yes   Positive cuff leak noted prior to extubation. Patient placed on [place] 4 Lpm with humidity, able to get 750 on Incentive spirometer.  Mingo [name] 02/06/2016, 11:42 AM

## 2016-02-06 NOTE — Progress Notes (Addendum)
SUBJECTIVE:  Intubated but fully alert and oriented  OBJECTIVE:   Vitals:   Filed Vitals:   02/06/16 0900 02/06/16 1001 02/06/16 1122 02/06/16 1135  BP: 132/61 123/74  130/69  Pulse: 81 88  100  Temp:   98.7 F (37.1 C)   TempSrc:   Oral   Resp: 17 15  22   Height:      Weight:      SpO2: 99% 100%  100%   I&O's:    Intake/Output Summary (Last 24 hours) at 02/06/16 1149 Last data filed at 02/06/16 1100  Gross per 24 hour  Intake 1829.5 ml  Output   1070 ml  Net  759.5 ml   TELEMETRY: Reviewed telemetry pt in NSR:     PHYSICAL EXAM General: Well developed, well nourished, in no acute distress Head: Eyes PERRLA, No xanthomas.   Normal cephalic and atramatic  Lungs:   Clear bilaterally to auscultation and percussion. Heart:   HRRR S1 S2 Pulses are 2+ & equal. Abdomen: Bowel sounds are positive, abdomen soft and non-tender without masses  Extremities:   No clubbing, cyanosis or edema.  DP +1 Neuro: Alert and oriented X 3. Psych:  Good affect, responds appropriately   LABS: Basic Metabolic Panel:  Recent Labs  02/04/16 0233 02/05/16 1007 02/06/16 0350  NA 132* 137 138  K 3.6 3.5 3.4*  CL 97* 99* 100*  CO2 24 26 27   GLUCOSE 106* 133* 162*  BUN 34* 33* 38*  CREATININE 1.41* 1.31* 1.27*  CALCIUM 8.6* 9.3 9.0  MG 2.2 2.2  --   PHOS 4.0 3.3  --    Liver Function Tests: No results for input(s): AST, ALT, ALKPHOS, BILITOT, PROT, ALBUMIN in the last 72 hours. No results for input(s): LIPASE, AMYLASE in the last 72 hours. CBC:  Recent Labs  02/05/16 1746 02/06/16 0350  WBC 6.5 7.5  HGB 9.8* 9.1*  HCT 30.3* 27.5*  MCV 80.8 80.6  PLT 121* 118*   Cardiac Enzymes: No results for input(s): CKTOTAL, CKMB, CKMBINDEX, TROPONINI in the last 72 hours. BNP: Invalid input(s): POCBNP D-Dimer: No results for input(s): DDIMER in the last 72 hours. Hemoglobin A1C: No results for input(s): HGBA1C in the last 72 hours. Fasting Lipid Panel: No results for  input(s): CHOL, HDL, LDLCALC, TRIG, CHOLHDL, LDLDIRECT in the last 72 hours. Thyroid Function Tests: No results for input(s): TSH, T4TOTAL, T3FREE, THYROIDAB in the last 72 hours.  Invalid input(s): FREET3 Anemia Panel: No results for input(s): VITAMINB12, FOLATE, FERRITIN, TIBC, IRON, RETICCTPCT in the last 72 hours. Coag Panel:   Lab Results  Component Value Date   INR 1.19 02/02/2016   INR 1.15 12/29/2015   INR 1.22 10/02/2015    RADIOLOGY: Dg Chest 2 View  02/02/2016  CLINICAL DATA:  Centralized chest pain radiating to LEFT arm since yesterday, shortness of breath, history coronary artery disease post MI and CABG, diabetes mellitus, hypertension EXAM: CHEST  2 VIEW COMPARISON:  12/29/2015 FINDINGS: Enlargement of cardiac silhouette post CABG. Pulmonary vascular congestion. Atherosclerotic calcification aorta. Improved pulmonary edema. No pleural effusion or pneumothorax. Bones demineralized. IMPRESSION: Mild pulmonary edema, improved since previous exam. Electronically Signed   By: Lavonia Dana M.D.   On: 02/02/2016 07:28   Dg Chest Port 1 View  02/06/2016  CLINICAL DATA:  Acute respiratory failure, intubated patient. EXAM: PORTABLE CHEST 1 VIEW COMPARISON:  Portable chest x-ray of February 04, 2016 FINDINGS: The lungs are adequately inflated. Patchy interstitial density projects medially in the right  lower lobe with a small amount in the medial aspect of the right middle lobe. The left lung is clear. There is no pleural effusion or pneumothorax. The cardiac silhouette remains enlarged. The pulmonary vascularity is not significantly engorged. The endotracheal tube tip lies 4.8 cm above the carina. The esophagogastric tube tip projects below the inferior margin of the image. IMPRESSION: Persistent infiltrate or atelectasis medially in the right middle and lower lobes. Stable cardiomegaly without pulmonary edema. The support tubes are in reasonable position. Electronically Signed   By: David  Martinique  M.D.   On: 02/06/2016 07:11   Dg Chest Port 1 View  02/04/2016  CLINICAL DATA:  65 year old female with a history of intubation. Head min 02/02/2016 with respiratory failure. EXAM: PORTABLE CHEST 1 VIEW COMPARISON:  02/03/2016, 02/02/2016 FINDINGS: Cardiomediastinal silhouette unchanged cardiomegaly. Surgical changes of prior median sternotomy and CABG. Calcifications of the aortic arch. Surgical staples project over the left heart border. Unchanged position of the endotracheal tube, which terminates suitably above the carina, measuring 3.3 cm. Overlying EKG leads. Gastric tube unchanged, terminating out of the field of view. Compared to the study of 02/02/2016 there has been improved airspace opacity bilateral lungs. Persisting mixed interstitial airspace opacity at the right medial base partially obscuring the right heart border. No pneumothorax.  No large pleural effusion. IMPRESSION: Endotracheal tube terminates suitably above the carina. Unchanged position of gastric tube. Improved airspace disease compared to the chest x-ray of 02/02/2016. Cardiomegaly Signed, Dulcy Fanny. Earleen Newport, DO [facility] Electronically Signed   By: Corrie Mckusick D.O.   On: 02/04/2016 06:56   Dg Chest Portable 1 View  02/03/2016  CLINICAL DATA:  Respiratory failure. EXAM: PORTABLE CHEST 1 VIEW COMPARISON:  02/02/2016. FINDINGS: Endotracheal tube and NG tube in stable position. Prior CABG. Cardiomegaly. Pulmonary vascular prominence with bilateral pulmonary infiltrates/ edema. Slight improvement from prior exam. IMPRESSION: 1. Lines and tubes stable position. 2. Prior CABG. Persistent cardiomegaly and bilateral pulmonary infiltrates/edema. Interim slight improvement from prior exam. Electronically Signed   By: [place]   On: 02/03/2016 07:05   Dg Chest Portable 1 View  02/02/2016  CLINICAL DATA:  Check endotracheal tube placement EXAM: PORTABLE CHEST 1 VIEW COMPARISON:   02/02/2016 FINDINGS: Endotracheal tube is been placed in lies approximately 2 cm above the carina. A nasogastric catheter is coiled within the stomach. Postsurgical changes are seen. Increasing perihilar densities are noted consistent with frank pulmonary edema. No sizable effusion is seen. No bony abnormality is noted. IMPRESSION: Tubes and lines in satisfactory position. Increasing pulmonary edema Electronically Signed   By: Inez Catalina M.D.   On: 02/02/2016 11:50      ASSESSMENT/PLAN:   65 y.o. female with a history of hx of CAD s/p CABG, HTN, diabetes, ischemic cardiomyopathy, combined systolic and diastolic CHF, Recent dx of breast cancer (plan to start chemo) moderate to severe mitral regurgitation, PAD status post prior renal artery stenting, HL, carotid stenosis status post R CEA, CKD, bipolar affective d/o who presented to [facility] ED for evaluation of SOB and chest pain  1) Acute on chronic systolic heart failure: Diuresing well - net neg 2.6L. Requiring less vent support.Renal function improving with diuresis.  Continue IV lasix.  Restart BB once extubated.  ACE I on hold due to AKI  2. CAD : She had more CP Saturday  She has significant diffuse CAD.  Has a very tight distal LAD stenosis that I suspect is the culprit. It would have  to be approached via the LIMA graft. She has diffuse moderate - severe CAD involving the LM, LAD and LCx.  - continue ASA/Plavix/IV NTG.  Heparin stopped due to gross hematuria. - restart statin - restart BB once extubated  Dr. Acie Fredrickson talked about PCI with her son Letitia Libra [phone]) yesterday He agrees with the procedure  Plan initially was for cath today  But given gross hematuria will cancel cath for now.  CCM says patient is ready for extubation so will proceed with that.  She is not having any anginal symptoms.  She communicates through writing in a book and states the chest pain she had over weekend was not like her angina and completely  different.  CCM feels it was related to vent wean.  Will get Urology consult prior to cath since she will need IIb/IIIa inhibitor which may increase risk of recurrent hematuria.   3.  Hypokalemia - replete   4.  Hematuria - ? Etiology.  Heparin stopped. Hbg down to 9.1.  This started yesterday and she has had foley in since admission and heparin since 4/7.  ? Foley trauma.  Urology consult pending.  5.  Hyperlipidemia - continue statin.    6.  HTN - BP controlled    Sueanne Margarita, MD  02/06/2016  11:49 AM

## 2016-02-06 NOTE — Consult Note (Addendum)
Urology Consult   Physician requesting consult: Baltazar Apo MD  Reason for consult: Gross hematuria   History of Present Illness: Penny James is a 65 y.o. female admitted on 02/02/16 for treatment of acute respiratory failure that required mechanical ventilation.  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 After admission she had a non-ST elevation MI and decrease in her LV function. Pt was extubated this am.  She was restarted on her home dose of ASA and plavix.  IV Heparin was started after admission.  A foley was placed on admission and today she was noted to have gross hematuria.  Heparin was stopped this morning.   Pt denies a history of voiding or storage urinary symptoms, hematuria, UTIs, urolithiasis, GU malignancy/trauma/surgery.  She is currently resting but states she "doesn't feel good".  She denies F/C, HA, CP, SOB, N/V, and diarrhea/constipation.   UA collected yesterday was nitrite positive with TNTC RBCs, 6-30 WBCs, and rare bacteria.  Urine culture has not been sent.  WBC 7.5, Cr 1.27.   Past Medical History  Diagnosis Date  . Hyperlipidemia   . Arthritis   . Anemia   . Chronic combined systolic and diastolic CHF (congestive heart failure) ([place])     a. EF 55% by cath 2010 but in 09/2015 found to be 20%, with mod-severe MR, grade 2 DD.  . Bipolar affective disorder ([place])   . GERD (gastroesophageal reflux disease)   . Obesity   . Peripheral neuropathy ([place])   . 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 CAD (coronary artery disease)     a. CABG in 2002. b. Cath 09/2015: poor targets, not a good surgical candidate, high risk PCI reserved for recurrent symptoms.  . Carotid artery stenosis      60-79% bilateral ICA stenosis s/p Right CEA 2013  followed by Dr. Donnetta Hutching  . Mitral regurgitation     a. Mod-severe by echo 09/2015.  . Ischemic cardiomyopathy   . CKD (chronic kidney disease), stage III   . Breast cancer ([place])   . Elevated troponin 02/06/2016    Past Surgical History  Procedure Laterality Date  . Abdominal hysterectomy  1984  . Sp pta renal / visc  artery  2003    with stent of left renal artery  . Tonsillectomy      AS A CHILD  . Endarterectomy  02/18/2012    rocedure: ENDARTERECTOMY CAROTID;  Surgeon: Rosetta Posner, MD;  Location: [facility] OR;  Service: Vascular;  Laterality: Right;  Right Carotid endarterectomy with Dacron patch angioplasty with resection of internal carotid artery  . Coronary artery bypass graft      3 vessel coronary disease S?P CABG with widely patent grafts by cath in 2005 and distal disease past the graft insertion sites.  . Cardiac catheterization  03/25/09    Occluded SVG to intermediate and SVG to OM/posterior descending artery with PTCA left circ 6/10 s/p PTCA stent in SVG to OM/PDA, 07/2002 s/p PCI of left circ 03/2009  . Cardiac catheterization N/A 10/05/2015    Procedure: Left Heart Cath and Cors/Grafts Angiography;  Surgeon: Troy Sine, MD;  Location: [facility];  Service: Cardiovascular;  Laterality: N/A;  Current Hospital Medications:  Home meds:    Medication List    ASK your doctor about these medications        ALDACTONE 25 MG tablet  Generic drug:  spironolactone  Take 0.5 tablets (12.5 mg total) by mouth daily.     aspirin EC 81 MG tablet  Take 81 mg by mouth daily.     BAYER MICROLET LANCETS lancets  Use as instructed     carvedilol 6.25 MG tablet  Commonly known as:  COREG  Take 3 tablets (18.75 mg total) by mouth 2 (two) times daily with a meal.     CRESTOR 40 MG tablet  Generic drug:  rosuvastatin  TAKE 1 TABLET BY MOUTH EVERY DAY     doxycycline 100 MG capsule  Commonly known as:  VIBRAMYCIN  Take 1  capsule (100 mg total) by mouth 2 (two) times daily.     ferrous sulfate 325 (65 FE) MG tablet  Take 325 mg by mouth daily with breakfast.     furosemide 80 MG tablet  Commonly known as:  LASIX  Take 1 tablet (80 mg total) by mouth every morning.     Glucose Blood Disk  Commonly known as:  BAYER BREEZE 2 TEST  Test blood sugar four times a day     isosorbide mononitrate 30 MG 24 hr tablet  Commonly known as:  IMDUR  Take 1 tablet (30 mg total) by mouth daily. OK to use name brand Imdur if pt prefers.     lansoprazole 15 MG capsule  Commonly known as:  PREVACID  Take 15 mg by mouth daily.     MULTIVITAL tablet  Take 1 tablet by mouth daily.     nitroGLYCERIN 0.4 MG SL tablet  Commonly known as:  NITROSTAT  Place 1 tablet (0.4 mg total) under the tongue every 5 (five) minutes as needed for chest pain.     olopatadine 0.1 % ophthalmic solution  Commonly known as:  PATANOL  Place 1 drop into both eyes 2 (two) times daily as needed for allergies.     PLAVIX 75 MG tablet  Generic drug:  clopidogrel  TAKE 1 TABLET BY MOUTH EVERY DAY     ZESTRIL 2.5 MG tablet  Generic drug:  lisinopril  Take 2.5 mg by mouth daily.     ZETIA 10 MG tablet  Generic drug:  ezetimibe  TAKE 1 TABLET BY MOUTH EVERY DAY        Scheduled Meds: . antiseptic oral rinse  7 mL Mouth Rinse QID  . aspirin  81 mg Per Tube Daily  . chlorhexidine gluconate (SAGE KIT)  15 mL Mouth Rinse BID  . clopidogrel  75 mg Per Tube Daily  . feeding supplement (VITAL HIGH PROTEIN)  1,000 mL Per Tube Q24H  . furosemide  60 mg Intravenous BID  . insulin aspart  0-15 Units Subcutaneous 6 times per day  . pantoprazole sodium  40 mg Per Tube Daily  . rosuvastatin  40 mg Oral q1800  . sodium chloride flush  3 mL Intravenous Q12H   Continuous Infusions: . sodium chloride 10 mL/hr at 02/06/16 0700  . sodium chloride 1 mL/kg/hr (02/06/16 1200)  . fentaNYL infusion INTRAVENOUS 50 mcg/hr (02/06/16 0700)  . nitroGLYCERIN  46.667 mcg/min (02/06/16 1456)   PRN Meds:.sodium chloride, albuterol, fentaNYL, midazolam, midazolam, sodium chloride flush  Allergies:  Allergies  Allergen Reactions  . Penicillins Rash    Has patient had a PCN reaction causing immediate rash,  facial/tongue/throat swelling, SOB or lightheadedness with hypotension: Yes Has patient had a PCN reaction causing severe rash involving mucus membranes or skin necrosis: Yes Has patient had a PCN reaction that required hospitalization Yes- admitted to hospital Has patient had a PCN reaction occurring within the last 10 years: No, childhood allergy If all of the above answers are "NO", then may proceed with Cephalosporin use.   . Protamine     Severe hypotension Internal bleeding around heart  . Amlodipine Swelling    Causes swelling in higher dosage    Family History  Problem Relation Age of Onset  . Heart disease Brother     Heart Disease before age 104  . Glaucoma Father   . Hypertension Father   . Stroke Paternal Grandmother   . Coronary artery disease Paternal Grandmother   . Cerebral aneurysm Brother   . Clotting disorder Sister     Social History:  reports that she has never smoked. She has never used smokeless tobacco. She reports that she does not drink alcohol or use illicit drugs.  ROS: A complete review of systems was performed.  All systems are negative except for pertinent findings as noted.  Physical Exam:  Vital signs in last 24 hours: Temp:  [98.2 F (36.8 C)-99.6 F (37.6 C)] 98.4 F (36.9 C) (04/10 1509) Pulse Rate:  [68-100] 100 (04/10 1400) Resp:  [13-28] 18 (04/10 1400) BP: (109-143)/(53-118) 109/70 mmHg (04/10 1400) SpO2:  [96 %-100 %] 100 % (04/10 1400) FiO2 (%):  [28 %-30 %] 30 % (04/10 1100) Weight:  [63.2 kg (139 lb 5.3 oz)] 63.2 kg (139 lb 5.3 oz) (04/10 0500) Constitutional:  Alert and oriented, No acute distress Cardiovascular: Regular rate and rhythm Respiratory: Normal respiratory effort with  O2 via [place] GI: Abdomen is soft, nontender, nondistended, no abdominal masses GU: No CVA tenderness; foley in place with dark red urine in bag but clear/yellow urine in tubing. Lymphatic: No lymphadenopathy Neurologic: Grossly intact, no focal deficits Psychiatric: Normal mood and affect  Laboratory Data:   Recent Labs  02/04/16 0233 02/05/16 0233 02/05/16 1746 02/06/16 0350  WBC 5.2 9.3 6.5 7.5  HGB 9.9* 10.1* 9.8* 9.1*  HCT 29.4* 30.3* 30.3* 27.5*  PLT 112* 120* 121* 118*     Recent Labs  02/04/16 0233 02/05/16 1007 02/06/16 0350  NA 132* 137 138  K 3.6 3.5 3.4*  CL 97* 99* 100*  GLUCOSE 106* 133* 162*  BUN 34* 33* 38*  CALCIUM 8.6* 9.3 9.0  CREATININE 1.41* 1.31* 1.27*     Results for orders placed or performed during the hospital encounter of 02/02/16 (from the past 24 hour(s))  Urinalysis, Routine w reflex microscopic (not at [facility])     Status: Abnormal   Collection Time: 02/05/16  4:20 PM  Result Value Ref Range   Color, Urine RED (A) YELLOW   APPearance CLOUDY (A) CLEAR   Specific Gravity, Urine 1.015 1.005 - 1.030   pH 6.0 5.0 - 8.0   Glucose, UA NEGATIVE NEGATIVE mg/dL   Hgb urine dipstick LARGE (A) NEGATIVE   Bilirubin Urine LARGE (A) NEGATIVE   Ketones, ur 15 (A) NEGATIVE mg/dL   Protein, ur >300 (A) NEGATIVE mg/dL   Nitrite POSITIVE (A) NEGATIVE   Leukocytes, UA MODERATE (A) NEGATIVE  Urine microscopic-add on     Status: Abnormal   Collection Time: 02/05/16  4:20 PM  Result Value Ref Range   Squamous Epithelial / LPF 0-5 (A) NONE SEEN   WBC, UA 6-30  0 - 5 WBC/hpf   RBC / HPF TOO NUMEROUS TO COUNT 0 - 5 RBC/hpf   Bacteria, UA RARE (A) NONE SEEN  CBC     Status: Abnormal   Collection Time: 02/05/16  5:46 PM  Result Value Ref Range   WBC 6.5 4.0 - 10.5 K/uL   RBC 3.75 (L) 3.87 - 5.11 MIL/uL   Hemoglobin 9.8 (L) 12.0 - 15.0 g/dL   HCT 30.3 (L) 36.0 - 46.0 %   MCV 80.8 78.0 - 100.0 fL   MCH 26.1 26.0 - 34.0 pg   MCHC 32.3 30.0 - 36.0 g/dL   RDW  15.9 (H) 11.5 - 15.5 %   Platelets 121 (L) 150 - 400 K/uL  Glucose, capillary     Status: Abnormal   Collection Time: 02/05/16  8:37 PM  Result Value Ref Range   Glucose-Capillary 176 (H) 65 - 99 mg/dL   Comment 1 Notify RN   Glucose, capillary     Status: Abnormal   Collection Time: 02/05/16 11:23 PM  Result Value Ref Range   Glucose-Capillary 171 (H) 65 - 99 mg/dL   Comment 1 Notify RN   Heparin level (unfractionated)     Status: Abnormal   Collection Time: 02/06/16  3:50 AM  Result Value Ref Range   Heparin Unfractionated <0.10 (L) 0.30 - 0.70 IU/mL  CBC     Status: Abnormal   Collection Time: 02/06/16  3:50 AM  Result Value Ref Range   WBC 7.5 4.0 - 10.5 K/uL   RBC 3.41 (L) 3.87 - 5.11 MIL/uL   Hemoglobin 9.1 (L) 12.0 - 15.0 g/dL   HCT 27.5 (L) 36.0 - 46.0 %   MCV 80.6 78.0 - 100.0 fL   MCH 26.7 26.0 - 34.0 pg   MCHC 33.1 30.0 - 36.0 g/dL   RDW 16.0 (H) 11.5 - 15.5 %   Platelets 118 (L) 150 - 400 K/uL  Basic metabolic panel     Status: Abnormal   Collection Time: 02/06/16  3:50 AM  Result Value Ref Range   Sodium 138 135 - 145 mmol/L   Potassium 3.4 (L) 3.5 - 5.1 mmol/L   Chloride 100 (L) 101 - 111 mmol/L   CO2 27 22 - 32 mmol/L   Glucose, Bld 162 (H) 65 - 99 mg/dL   BUN 38 (H) 6 - 20 mg/dL   Creatinine, Ser 1.27 (H) 0.44 - 1.00 mg/dL   Calcium 9.0 8.9 - 10.3 mg/dL   GFR calc non Af Amer 43 (L) >60 mL/min   GFR calc Af Amer 49 (L) >60 mL/min   Anion gap 11 5 - 15  Glucose, capillary     Status: Abnormal   Collection Time: 02/06/16  4:44 AM  Result Value Ref Range   Glucose-Capillary 161 (H) 65 - 99 mg/dL   Comment 1 Notify RN   Glucose, capillary     Status: Abnormal   Collection Time: 02/06/16  7:30 AM  Result Value Ref Range   Glucose-Capillary 142 (H) 65 - 99 mg/dL  Glucose, capillary     Status: Abnormal   Collection Time: 02/06/16 11:21 AM  Result Value Ref Range   Glucose-Capillary 106 (H) 65 - 99 mg/dL   Recent Results (from the past 240 hour(s))   MRSA PCR Screening     Status: None   Collection Time: 02/02/16  1:43 PM  Result Value Ref Range Status   MRSA by PCR NEGATIVE NEGATIVE Final    Comment:  The GeneXpert MRSA Assay (FDA approved for NASAL specimens only), is one component of a comprehensive MRSA colonization surveillance program. It is not intended to diagnose MRSA infection nor to guide or monitor treatment for MRSA infections.   Culture, blood (routine x 2)     Status: None (Preliminary result)   Collection Time: 02/03/16  9:31 AM  Result Value Ref Range Status   Specimen Description BLOOD BLOOD LEFT FOREARM  Final   Special Requests BOTTLES DRAWN AEROBIC AND ANAEROBIC 5CC  Final   Culture NO GROWTH 3 DAYS  Final   Report Status PENDING  Incomplete  Culture, blood (routine x 2)     Status: None (Preliminary result)   Collection Time: 02/03/16  9:38 AM  Result Value Ref Range Status   Specimen Description BLOOD BLOOD RIGHT FOREARM  Final   Special Requests IN PEDIATRIC BOTTLE 4CC  Final   Culture NO GROWTH 3 DAYS  Final   Report Status PENDING  Incomplete    Renal Function:  Recent Labs  02/02/16 0700 02/02/16 1159 02/03/16 0300 02/04/16 0233 02/05/16 1007 02/06/16 0350  CREATININE 1.37* 1.73* 1.63* 1.41* 1.31* 1.27*   Estimated Creatinine Clearance: 38.5 mL/min (by C-G formula based on Cr of 1.27).  Radiologic Imaging: Dg Chest Port 1 View  02/06/2016  CLINICAL DATA:  Acute respiratory failure, intubated patient. EXAM: PORTABLE CHEST 1 VIEW COMPARISON:  Portable chest x-ray of February 04, 2016 FINDINGS: The lungs are adequately inflated. Patchy interstitial density projects medially in the right lower lobe with a small amount in the medial aspect of the right middle lobe. The left lung is clear. There is no pleural effusion or pneumothorax. The cardiac silhouette remains enlarged. The pulmonary vascularity is not significantly engorged. The endotracheal tube tip lies 4.8 cm above the carina. The  esophagogastric tube tip projects below the inferior margin of the image. IMPRESSION: Persistent infiltrate or atelectasis medially in the right middle and lower lobes. Stable cardiomegaly without pulmonary edema. The support tubes are in reasonable position. Electronically Signed   By: David  Martinique M.D.   On: 02/06/2016 07:11    Impression/Recommendation:  Gross hematuria--due to combination of multiple blood thinners (ASA, plavix, heparin), possible foley trauma, and UTI.  Heparin was stopped this morning and urine appears to be clearing up.  Other than possibly contributing to her [place] she has been asymptomatic from the UTI.  Send urine for culture and treat per results.  Can start Bactrim in the interim.  D/c foley when pt stable from all other medical issues and appropriate. If hematuria persists or returns after these issues resolve she may cystoscopy.    I interviewed/examined pt. I agree with assessment as above. Urine clearing at present, and I do not think pt had significant blood loss. OK with empiric abx mgmt until urine culture returns. I will sign off--reconsult if clinical course changes.  [place], [place] 02/06/2016, 4:12 PM

## 2016-02-06 NOTE — Consult Note (Addendum)
Interventional Cardiology Note   Recent coronary angiography was reviewed (09/2015) The patient has terrible diffuse native CAD and bypass graft failure.  She has total RCA, Patent LIMA to LAD, diffuse prox and antegrade native LAD disease, and severe calcified LM supplying circumflex. The circumflex has total occlusion of several sizable marginals.  The current clinical presentation has been fully reviewed in detail. She has global severe LV dysfunction with EF <20% by the most current echocardiogram.  Question is whether high risk PCI on LM will be helpful. Severe diffuse disease beyond the left main will limit response to therapy.  The patient has been extubated, and we had a lengthy conversation concerning possible high risk PCI. We discussed the increased risk of infarction, shock, recurrent acute pulmonary edema, stroke, death, and kidney failure. 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  The patient has decided she'd prefer medical therapy rather than attempted PCI.  Will decrease IV fluid infusion rate.

## 2016-02-06 NOTE — Progress Notes (Signed)
PULMONARY / CRITICAL CARE MEDICINE   Name: Penny James MRN: [id number] DOB: 08-29-1951    ADMISSION DATE:  02/02/2016  REFERRING MD:  EDP PCP - Dr. 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  In ER she had marked respiratory distress.  She was treated with asa, nitro gtt, lasix IV. First troponin wnl. BNP 1635.  She continued to worsen despite attempts at bipap and was intubated.  PCCM called to admit.    SUBJECTIVE:  Pt had an episode of gross hematuria overnight and as a result heparin was stopped.  She is fully alert and oriented.   VITAL SIGNS: BP 123/74 mmHg  Pulse 88  Temp(Src) 98.7 F (37.1 C) (Oral)  Resp 15  Ht 5\' 3"  (1.6 m)  Wt 139 lb 5.3 oz (63.2 kg)  BMI 24.69 kg/m2  SpO2 100%  HEMODYNAMICS:    VENTILATOR SETTINGS: Vent Mode:  [-] PRVC FiO2 (%):  [28 %-30 %] 28 % Set Rate:  [14 bmp] 14 bmp Vt Set:  [460 mL] 460 mL PEEP:  [5 cmH20] 5 cmH20 Plateau Pressure:  [17 cmH20-20 cmH20] 19 cmH20  INTAKE / OUTPUT: I/O last 3 completed shifts: In: 2736 [I.V.:1126; NG/GT:1610] Out: 2070 [Urine:2070]  PHYSICAL EXAMINATION: General:  Chronically ill appearing female, NAD on vent  Neuro:  Alert and following commands - asked when she was going to be extubated   Cardiovascular:  s1s2 rrr,  Lungs:  Minimal crackles, Abdomen:  Round, soft, +BS Musculoskeletal:   No pitting edema   LABS:  BMET  Recent Labs Lab 02/04/16 0233 02/05/16 1007 02/06/16 0350  NA 132* 137 138  K 3.6 3.5 3.4*  CL 97* 99* 100*  CO2 24 26 27   BUN 34* 33* 38*  CREATININE 1.41* 1.31* 1.27*  GLUCOSE 106* 133* 162*     Electrolytes  Recent Labs Lab 02/03/16 0300 02/04/16 0233 02/05/16 1007 02/06/16 0350  CALCIUM 9.3 8.6* 9.3 9.0  MG 2.0 2.2 2.2  --   PHOS 3.0 4.0 3.3  --     CBC  Recent Labs Lab 02/05/16 0233 02/05/16 1746 02/06/16 0350  WBC 9.3 6.5 7.5  HGB 10.1* 9.8* 9.1*  HCT 30.3* 30.3* 27.5*  PLT 120* 121* 118*    Coag's  Recent Labs Lab 02/02/16 0700  INR 1.19    Sepsis Markers No results for input(s): LATICACIDVEN, PROCALCITON, O2SATVEN in the last 168 hours.  ABG  Recent Labs Lab 02/02/16 1206 02/03/16 0420 02/03/16 1037  PHART 7.298* 7.596* 7.515*  PCO2ART 50.7* 21.5* 30.3*  PO2ART 326.0* 203* 205.0*    Liver Enzymes  Recent Labs Lab 02/02/16 0700  AST 28  ALT 22  ALKPHOS 56  BILITOT 0.9  ALBUMIN 4.2    Cardiac Enzymes  Recent Labs Lab 02/02/16 1159 02/03/16  TROPONINI 0.14* 1.61*    Glucose  Recent Labs Lab 02/05/16 1200 02/05/16 1603 02/05/16 2037 02/05/16 2323 02/06/16 0444 02/06/16 0730  GLUCAP 130* 122* 176* 171* 161* 142*    Imaging Dg Chest Port 1 View  02/06/2016  CLINICAL DATA:  Acute respiratory failure, intubated patient. EXAM: PORTABLE CHEST 1  VIEW COMPARISON:  Portable chest x-ray of February 04, 2016 FINDINGS: The lungs are adequately inflated. Patchy interstitial density projects medially in the right lower lobe with a small amount in the medial aspect of the right middle lobe. The left lung is clear. There is no pleural effusion or pneumothorax. The cardiac silhouette remains enlarged. The pulmonary vascularity is not significantly engorged. The endotracheal tube tip lies 4.8 cm above the carina. The esophagogastric tube tip projects below the inferior margin of the image. IMPRESSION: Persistent infiltrate or atelectasis medially in the right middle and lower lobes. Stable cardiomegaly without pulmonary edema. The support tubes are in reasonable position. Electronically Signed   By: David  Martinique M.D.   On: 02/06/2016  07:11    STUDIES:  CXC 04/06 > mild pulmonary edema. CXC 04/10 > persistent atelectasis or infiltrate in right middle and lower lobes   CULTURES: None.  ANTIBIOTICS: None.  SIGNIFICANT EVENTS: 4/6 admitted for sob,cp. Intubated. NSTEMI.  4/10 SBT and extubation   LINES/TUBES: ETT 04/06 > 04/10  DISCUSSION: 65 y.o. 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  Admitted 04/06 with SOB secondary to CHF exacerbation.  Concern for decompensated heart failure. She has been diuresing well on lasix 40 mg BID.   ASSESSMENT / PLAN:  CARDIOVASCULAR A/P:  Acute on chronic combined systolic and diastolic heart failure - EF 20% with diffuse hypokinesis (01/2016)- Pt has been diuresing well on Lasix 40 mg IV BID and her creatinine is stable.   -Plan was to a LHC today- but she had gross hematuria, and so were unsure as to the etiology behind sudden onset of gross hematuria, and heparin was stopped. Discussed with Cardiology at the room- given that she already has impaired LV function, doing a LHC may not significantly improve her LVEF. Also, the procedure is very involved with at least 3 hours, and so unsure if the patient would be able to tolerate that. Family will be informed of this. She is already on maximal medical therapy including asa and plavix. Plan is to extubate her this morning.  -continue outpatient ASA and plavix -Lasix 60 mg IV BID  PULMONARY A: Acute hypoxic respiratory failure - presumed due to CHF exacerbation. Failed BiPAP in ED and required intubation. Pulmonary edema.  P:  -extubate today if she tolerates SBT -continue diuresis  -albuterol PRN   RENAL A:  Hypokalemia P:  -replete K -lasix 60 mg IV BID   GASTROINTESTINAL A:  GERD. Obesity. Nutrition. P:   SUP: Pantoprazole. 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 Lindi Adie.) - recommended adjuvant chemo, radiation. Oncology recommending breast conserving surgery with sentinel lymph node bx. Pancytopenia, ? Due to malignancy vs acute illness VTE Prophylaxis.   P:  -stopped heparin gtt -SCD's  -coordinate outpatient appointment with Dr Lindi Adie upon discharge   INFECTIOUS A:  No indication of infection currently. P:  Monitor clinically. Will consider empiric abx if she declines in any way  ENDOCRINE A:  DM.  P:  SSI. -CBGs have been stable   NEUROLOGIC A:  Currently alert and oriented  Hx bipolar affective disorder, peripheral neuropathy, carotid artery stenosis. - has been on versed and fentanyl prn P:  -stop fentanyl and versed once extubated   Signed Vernie Murders  PGY1 Internal Medicine

## 2016-02-06 NOTE — Care Management Important Message (Signed)
Important Message  Patient Details  Name: Penny James MRN: [id number] Date of Birth: 09/01/1951   Medicare Important Message Given:  Yes    Nathen May 02/06/2016, 12:10 PM

## 2016-02-07 ENCOUNTER — Inpatient Hospital Stay (HOSPITAL_COMMUNITY): Admission: RE | Admit: 2016-02-07 | Payer: Medicare Other | Source: Ambulatory Visit

## 2016-02-07 ENCOUNTER — Inpatient Hospital Stay (HOSPITAL_COMMUNITY): Payer: Medicare Other

## 2016-02-07 DIAGNOSIS — R05 Cough: Secondary | ICD-10-CM | POA: Insufficient documentation

## 2016-02-07 DIAGNOSIS — Z66 Do not resuscitate: Secondary | ICD-10-CM | POA: Insufficient documentation

## 2016-02-07 DIAGNOSIS — Z515 Encounter for palliative care: Secondary | ICD-10-CM | POA: Insufficient documentation

## 2016-02-07 DIAGNOSIS — Z7189 Other specified counseling: Secondary | ICD-10-CM | POA: Insufficient documentation

## 2016-02-07 DIAGNOSIS — R059 Cough, unspecified: Secondary | ICD-10-CM | POA: Insufficient documentation

## 2016-02-07 LAB — COMPREHENSIVE METABOLIC PANEL
ALK PHOS: 41 U/L (ref 38–126)
ALT: 25 U/L (ref 14–54)
ANION GAP: 13 (ref 5–15)
AST: 106 U/L — ABNORMAL HIGH (ref 15–41)
Albumin: 2.8 g/dL — ABNORMAL LOW (ref 3.5–5.0)
BUN: 37 mg/dL — ABNORMAL HIGH (ref 6–20)
CALCIUM: 9.3 mg/dL (ref 8.9–10.3)
CO2: 26 mmol/L (ref 22–32)
Chloride: 102 mmol/L (ref 101–111)
Creatinine, Ser: 1.34 mg/dL — ABNORMAL HIGH (ref 0.44–1.00)
GFR, EST AFRICAN AMERICAN: 46 mL/min — AB (ref >60)
GFR, EST NON AFRICAN AMERICAN: 40 mL/min — AB (ref >60)
Glucose, Bld: 109 mg/dL — ABNORMAL HIGH (ref 65–99)
Potassium: 3.9 mmol/L (ref 3.5–5.1)
SODIUM: 141 mmol/L (ref 135–145)
TOTAL PROTEIN: 5.6 g/dL — AB (ref 6.5–8.1)
Total Bilirubin: 1 mg/dL (ref 0.3–1.2)

## 2016-02-07 LAB — GLUCOSE, CAPILLARY
GLUCOSE-CAPILLARY: 110 mg/dL — AB (ref 65–99)
GLUCOSE-CAPILLARY: 108 mg/dL — AB (ref 65–99)
GLUCOSE-CAPILLARY: 143 mg/dL — AB (ref 65–99)
GLUCOSE-CAPILLARY: 126 mg/dL — AB (ref 65–99)
Glucose-Capillary: 104 mg/dL — ABNORMAL HIGH (ref 65–99)
Glucose-Capillary: 115 mg/dL — ABNORMAL HIGH (ref 65–99)

## 2016-02-07 LAB — PROTIME-INR
INR: 1.27 (ref 0.00–1.49)
PROTHROMBIN TIME: 16.1 s — AB (ref 11.6–15.2)

## 2016-02-07 LAB — CBC
HCT: 27.7 % — ABNORMAL LOW (ref 36.0–46.0)
HEMOGLOBIN: 8.9 g/dL — AB (ref 12.0–15.0)
MCH: 26.3 pg (ref 26.0–34.0)
MCHC: 32.1 g/dL (ref 30.0–36.0)
MCV: 81.7 fL (ref 78.0–100.0)
Platelets: 140 10*3/uL — ABNORMAL LOW (ref 150–400)
RBC: 3.39 MIL/uL — ABNORMAL LOW (ref 3.87–5.11)
RDW: 16.1 % — ABNORMAL HIGH (ref 11.5–15.5)
WBC: 6.4 10*3/uL (ref 4.0–10.5)

## 2016-02-07 MED ORDER — PANTOPRAZOLE SODIUM 40 MG PO TBEC
40.0000 mg | DELAYED_RELEASE_TABLET | Freq: Every day | ORAL | Status: DC
  Administered 2016-02-07 – 2016-02-11 (×5): 40 mg via ORAL
  Filled 2016-02-07 (×5): qty 1

## 2016-02-07 MED ORDER — NITROGLYCERIN 2 % TD OINT
1.0000 [in_us] | TOPICAL_OINTMENT | Freq: Four times a day (QID) | TRANSDERMAL | Status: DC
  Administered 2016-02-07 (×3): 1 [in_us] via TOPICAL
  Filled 2016-02-07 (×3): qty 1

## 2016-02-07 MED ORDER — PANTOPRAZOLE SODIUM 40 MG PO PACK
40.0000 mg | PACK | Freq: Every day | ORAL | Status: DC
  Filled 2016-02-07: qty 20

## 2016-02-07 MED ORDER — MORPHINE SULFATE (PF) 2 MG/ML IV SOLN
1.0000 mg | INTRAVENOUS | Status: DC | PRN
  Administered 2016-02-07 – 2016-02-08 (×5): 2 mg via INTRAVENOUS
  Filled 2016-02-07 (×5): qty 1

## 2016-02-07 MED ORDER — INSULIN ASPART 100 UNIT/ML ~~LOC~~ SOLN
0.0000 [IU] | Freq: Three times a day (TID) | SUBCUTANEOUS | Status: DC
  Administered 2016-02-08 – 2016-02-09 (×4): 2 [IU] via SUBCUTANEOUS
  Administered 2016-02-10: 3 [IU] via SUBCUTANEOUS

## 2016-02-07 MED ORDER — PANTOPRAZOLE SODIUM 40 MG PO PACK: 40.0000 mg | PACK | Freq: Every day | ORAL | Status: DC

## 2016-02-07 MED ORDER — HYDROCOD POLST-CPM POLST ER 10-8 MG/5ML PO SUER: 5.0000 mL | Freq: Two times a day (BID) | ORAL | Status: DC | PRN

## 2016-02-07 MED ORDER — ISOSORBIDE MONONITRATE ER 30 MG PO TB24
60.0000 mg | ORAL_TABLET | Freq: Every day | ORAL | Status: DC
  Administered 2016-02-07 – 2016-02-08 (×2): 60 mg via ORAL
  Filled 2016-02-07: qty 1
  Filled 2016-02-07: qty 2

## 2016-02-07 MED ORDER — CARVEDILOL 3.125 MG PO TABS
3.1250 mg | ORAL_TABLET | Freq: Two times a day (BID) | ORAL | Status: DC
  Administered 2016-02-07 – 2016-02-08 (×2): 3.125 mg via ORAL
  Filled 2016-02-07 (×3): qty 1

## 2016-02-07 MED ORDER — INSULIN ASPART 100 UNIT/ML ~~LOC~~ SOLN
0.0000 [IU] | Freq: Every day | SUBCUTANEOUS | Status: DC
Start: 2016-02-07 — End: 2016-02-11

## 2016-02-07 NOTE — Progress Notes (Signed)
SUBJECTIVE:  Now extubated and feeling better  OBJECTIVE:   Vitals:   Filed Vitals:   02/07/16 0700 02/07/16 0729 02/07/16 0800 02/07/16 0900  BP: 102/59  110/67 118/67  Pulse: 81  81 83  Temp:  98.3 F (36.8 C)    TempSrc:  Oral    Resp: 26  25 24   Height:      Weight:      SpO2: 100%  100% 100%   I&O's:   Intake/Output Summary (Last 24 hours) at 02/07/16 1021 Last data filed at 02/07/16 0900  Gross per 24 hour  Intake 913.45 ml  Output   1175 ml  Net -261.55 ml   TELEMETRY: Reviewed telemetry pt in NSR with short 6 beats runs of WCT     PHYSICAL EXAM General: Well developed, well nourished, in no acute distress Head: Eyes PERRLA, No xanthomas.   Normal cephalic and atramatic  Lungs:   Decreased BS posteriorly Heart:   HRRR S1 S2 Pulses are 2+ & equal. Abdomen: Bowel sounds are positive, abdomen soft and non-tender without masses  Extremities:   No clubbing, cyanosis or edema.  DP +1 Neuro: Alert and oriented X 3. Psych:  Good affect, responds appropriately   LABS: Basic Metabolic Panel:  Recent Labs  02/05/16 1007 02/06/16 0350 02/07/16 0213  NA 137 138 141  K 3.5 3.4* 3.9  CL 99* 100* 102  CO2 26 27 26   GLUCOSE 133* 162* 109*  BUN 33* 38* 37*  CREATININE 1.31* 1.27* 1.34*  CALCIUM 9.3 9.0 9.3  MG 2.2  --   --   PHOS 3.3  --   --    Liver Function Tests:  Recent Labs  02/07/16 0213  AST 106*  ALT 25  ALKPHOS 41  BILITOT 1.0  PROT 5.6*  ALBUMIN 2.8*   No results for input(s): LIPASE, AMYLASE in the last 72 hours. CBC:  Recent Labs  02/06/16 0350 02/07/16 0213  WBC 7.5 6.4  HGB 9.1* 8.9*  HCT 27.5* 27.7*  MCV 80.6 81.7  PLT 118* 140*   Cardiac Enzymes: No results for input(s): CKTOTAL, CKMB, CKMBINDEX, TROPONINI in the last 72 hours. BNP: Invalid input(s): POCBNP D-Dimer: No results for input(s): DDIMER in the last 72 hours. Hemoglobin A1C: No results for input(s): HGBA1C in the last 72 hours. Fasting Lipid Panel: No  results for input(s): CHOL, HDL, LDLCALC, TRIG, CHOLHDL, LDLDIRECT in the last 72 hours. Thyroid Function Tests: No results for input(s): TSH, T4TOTAL, T3FREE, THYROIDAB in the last 72 hours.  Invalid input(s): FREET3 Anemia Panel: No results for input(s): VITAMINB12, FOLATE, FERRITIN, TIBC, IRON, RETICCTPCT in the last 72 hours. Coag Panel:   Lab Results  Component Value Date   INR 1.27 02/07/2016   INR 1.19 02/02/2016   INR 1.15 12/29/2015    RADIOLOGY: Dg Chest 2 View  02/02/2016  CLINICAL DATA:  Centralized chest pain radiating to LEFT arm since yesterday, shortness of breath, history coronary artery disease post MI and CABG, diabetes mellitus, hypertension EXAM: CHEST  2 VIEW COMPARISON:  12/29/2015 FINDINGS: Enlargement of cardiac silhouette post CABG. Pulmonary vascular congestion. Atherosclerotic calcification aorta. Improved pulmonary edema. No pleural effusion or pneumothorax. Bones demineralized. IMPRESSION: Mild pulmonary edema, improved since previous exam. Electronically Signed   By: Lavonia Dana M.D.   On: 02/02/2016 07:28   Dg Chest Port 1 View  02/07/2016  CLINICAL DATA:  Dyspnea EXAM: PORTABLE CHEST 1 VIEW COMPARISON:  02/06/2016 FINDINGS: There is moderate cardiomegaly, unchanged. The lungs  are clear except for minor linear basilar opacities. There is no large effusion. There is no pneumothorax. Endotracheal tube and nasogastric tube have been removed. IMPRESSION: Removal of ET and NG tubes. The lungs are grossly clear. No pneumothorax. Electronically Signed   By: Andreas Newport M.D.   On: 02/07/2016 05:54   Dg Chest Port 1 View  02/06/2016  CLINICAL DATA:  Acute respiratory failure, intubated patient. EXAM: PORTABLE CHEST 1 VIEW COMPARISON:  Portable chest x-ray of February 04, 2016 FINDINGS: The lungs are adequately inflated. Patchy interstitial density projects medially in the right lower lobe with a small amount in the medial aspect of the right middle lobe. The left lung is  clear. There is no pleural effusion or pneumothorax. The cardiac silhouette remains enlarged. The pulmonary vascularity is not significantly engorged. The endotracheal tube tip lies 4.8 cm above the carina. The esophagogastric tube tip projects below the inferior margin of the image. IMPRESSION: Persistent infiltrate or atelectasis medially in the right middle and lower lobes. Stable cardiomegaly without pulmonary edema. The support tubes are in reasonable position. Electronically Signed   By: David  Martinique M.D.   On: 02/06/2016 07:11   Dg Chest Port 1 View  02/04/2016  CLINICAL DATA:  65 year old female with a history of intubation. Head min 02/02/2016 with respiratory failure. EXAM: PORTABLE CHEST 1 VIEW COMPARISON:  02/03/2016, 02/02/2016 FINDINGS: Cardiomediastinal silhouette unchanged cardiomegaly. Surgical changes of prior median sternotomy and CABG. Calcifications of the aortic arch. Surgical staples project over the left heart border. Unchanged position of the endotracheal tube, which terminates suitably above the carina, measuring 3.3 cm. Overlying EKG leads. Gastric tube unchanged, terminating out of the field of view. Compared to the study of 02/02/2016 there has been improved airspace opacity bilateral lungs. Persisting mixed interstitial airspace opacity at the right medial base partially obscuring the right heart border. No pneumothorax.  No large pleural effusion. IMPRESSION: Endotracheal tube terminates suitably above the carina. Unchanged position of gastric tube. Improved airspace disease compared to the chest x-ray of 02/02/2016. Cardiomegaly Signed, Dulcy Fanny. Earleen Newport, DO [facility] Electronically Signed   By: Corrie Mckusick D.O.   On: 02/04/2016 06:56   Dg Chest Portable 1 View  02/03/2016  CLINICAL DATA:  Respiratory failure. EXAM: PORTABLE CHEST 1 VIEW COMPARISON:  02/02/2016. FINDINGS: Endotracheal tube and NG tube in stable  position. Prior CABG. Cardiomegaly. Pulmonary vascular prominence with bilateral pulmonary infiltrates/ edema. Slight improvement from prior exam. IMPRESSION: 1. Lines and tubes stable position. 2. Prior CABG. Persistent cardiomegaly and bilateral pulmonary infiltrates/edema. Interim slight improvement from prior exam. Electronically Signed   By: [place]   On: 02/03/2016 07:05   Dg Chest Portable 1 View  02/02/2016  CLINICAL DATA:  Check endotracheal tube placement EXAM: PORTABLE CHEST 1 VIEW COMPARISON:  02/02/2016 FINDINGS: Endotracheal tube is been placed in lies approximately 2 cm above the carina. A nasogastric catheter is coiled within the stomach. Postsurgical changes are seen. Increasing perihilar densities are noted consistent with frank pulmonary edema. No sizable effusion is seen. No bony abnormality is noted. IMPRESSION: Tubes and lines in satisfactory position. Increasing pulmonary edema Electronically Signed   By: Inez Catalina M.D.   On: 02/02/2016 11:50    ASSESSMENT/PLAN:  65 y.o. female with a history of hx of CAD s/p CABG, HTN, diabetes, ischemic cardiomyopathy, combined systolic and diastolic CHF, Recent dx of breast cancer (plan to start chemo) moderate to severe mitral regurgitation, PAD status post prior renal  artery stenting, HL, carotid stenosis status post R CEA, CKD, bipolar affective d/o who presented to [facility] ED for evaluation of SOB and chest pain  1) Acute on chronic systolic heart failure: Diuresing well - net neg 2.8L. Now extubated. Renal function improved with diuresis.Continue IV lasix. ACE I and aldactone on hold due to AKI.  Restart Coreg at 3.125mg  BID and titrate as BP allows.    2. CAD s/p remote CABG with cath 09/2015 with all vein grafts occluded and LIMA to LAD widely patent but distal LAD with diffuse disease Has a very tight distal LAD stenosis that I suspect is the culprit. It would have to be approached via the LIMA graft. She has diffuse  moderate - severe CAD involving the LM, LAD and LCx. She discussed options with Dr. Tamala Julian yesterday and has decided not to pursue PCI and will remain on medical management.  - continue ASA/Plavix/statin. - restart Imdur.  Ranexa will most likely be cost prohibitive. - patient had long discussion regarding high risk PCI with Dr. Tamala Julian and has decided against it.  Will continue with medical management.  One thing that has made medical management difficult is that she does not want generic meds but then cost becomes difficult.    3. Hypokalemia - repleted   4. Hematuria - ? Etiology. Heparin stopped. Hbg down to 8.9. Resolving after stopping Heparin. Urology consult appreciated.    5. Hyperlipidemia - continue statin.   6. HTN - BP controlled   7.  Wide complex tachycardia nonsustained and most likely NSVT.  Will restart BB.    8.  Ischemic DCM EF 20% by echo with NSVT on monitor.  She has been on Coreg and Aldactone in the past. She has refused to take Imdur or hydralazine in the past because there is no brand name for these and she is convinced that generic drugs were responsible for her heart problems in the past. If BP tolerates readdition of Coreg then will consider adding Bidil.  Will ask EP to evaluate for possible AICD.  She is not on max medical therapy but has refused meds in the past as stated above.    9. Carotid artery stenosis with s/p R CEA and now with LICA [id number] - Dr. Donnetta Hutching following.    Penny Margarita, MD  02/07/2016  10:21 AM

## 2016-02-07 NOTE — Evaluation (Signed)
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 In ER she had marked respiratory distress. She was treated with asa, nitro gtt, lasix IV. First troponin wnl. BNP 1635. She continued to worsen despite attempts at bipap and was intubated.    Clinical Impression  Pt admitted with above diagnosis. Pt currently with functional limitations due to the deficits listed below (see PT Problem List). Pt was able to walk over to 2C to move to her new room, but started to develop chest pain towards the end of the walk. Nurse was present and administered pain medicine during session. She tolerated activity on room air with SpO2 ranging from 95-98%. Pt needed bilateral hand hold assist to maintain balance. Pt will benefit from skilled PT to increase their independence and safety with mobility to allow discharge to the venue listed below.  Pt would also benefit from HHPT for home safety evaluation and to improve independence with functional mobility to decrease fall risk and ensure a smooth transition home at discharge.      Follow Up Recommendations Home health PT;Supervision for mobility/OOB    Equipment Recommendations  Rolling walker with 5" wheels    Recommendations for Other Services OT consult     Precautions / Restrictions Precautions Precautions: Fall Restrictions Weight Bearing Restrictions: No      Mobility  Bed Mobility Overal bed mobility: Needs Assistance Bed Mobility: Supine to Sit     Supine to sit: Min guard;HOB elevated     General bed  mobility comments: Min Guard for safety, no physical assistance needed.   Transfers Overall transfer level: Needs assistance Equipment used: 2 person hand held assist Transfers: Sit to/from [facility] Sit to Stand: Min assist Stand pivot transfers: Min assist       General transfer comment: Min A for balance.   Ambulation/Gait Ambulation/Gait assistance: Min assist Ambulation Distance (Feet): 450 Feet Assistive device: 2 person hand held assist Gait Pattern/deviations: Step-through pattern;Decreased stride length Gait velocity: decreased   General Gait Details: Slight unsteadiness with gait, reliant on bilateral UE support. Attempted walking with only one UE supported, but pt's feet were scissoring and she was needed Mod A to correct LOB. Pt experienced chest pain at the end of the walk.   Stairs            Wheelchair Mobility    Modified Rankin (Stroke Patients Only)       Balance Overall balance assessment: Needs assistance Sitting-balance support: Feet supported;Bilateral upper extremity supported Sitting balance-Leahy Scale: Fair     Standing balance support: During functional activity Standing balance-Leahy Scale: Poor Standing balance comment: Reliant on UE support.                              Pertinent Vitals/Pain Pain Assessment: Faces Faces Pain Scale: Hurts even more Pain Location: Chest after ambulation Pain Descriptors / Indicators: Grimacing;Discomfort Pain Intervention(s): RN gave pain meds during session    Home Living Family/patient expects to be discharged to:: Private residence Living Arrangements: Spouse/significant other  Available Help at Discharge: Family;Available 24 hours/day Type of Home: House Home Access: Level entry     Home Layout: One level Home Equipment: None Additional Comments: Pt lives with her husband who is available to help her as needed.     Prior Function Level of Independence:  Independent         Comments: Independent with ADL's and IADL's      Hand Dominance   Dominant Hand: Right    Extremity/Trunk Assessment   Upper Extremity Assessment: Generalized weakness           Lower Extremity Assessment: Generalized weakness      Cervical / Trunk Assessment: Kyphotic  Communication   Communication: No difficulties  Cognition Arousal/Alertness: Awake/alert Behavior During Therapy: WFL for tasks assessed/performed Overall Cognitive Status: Within Functional Limits for tasks assessed                      General Comments General comments (skin integrity, edema, etc.): Pt very pleasant and cooperative with therapy. Very appreciative of her healthcare team and the care she has recieved so far.     Exercises        Assessment/Plan    PT Assessment Patient needs continued PT services  PT Diagnosis Difficulty walking;Abnormality of gait;Generalized weakness;Acute pain   PT Problem List Decreased strength;Decreased activity tolerance;Decreased balance;Decreased knowledge of use of DME;Cardiopulmonary status limiting activity;Pain  PT Treatment Interventions DME instruction;Gait training;Functional mobility training;Therapeutic activities;Therapeutic exercise;Balance training;Patient/family education   PT Goals (Current goals can be found in the Care Plan section) Acute Rehab PT Goals Patient Stated Goal: To go home PT Goal Formulation: With patient Time For Goal Achievement: 02/21/16 Potential to Achieve Goals: Good    Frequency Min 3X/week   Barriers to discharge        Co-evaluation               End of Session Equipment Utilized During Treatment: Gait belt Activity Tolerance: Patient tolerated treatment well Patient left: in chair;with call bell/phone within reach;with nursing/sitter in room Nurse Communication: Mobility status         Time: [id number] PT Time Calculation (min) (ACUTE ONLY): 35 min   Charges:    PT Evaluation $PT Eval Moderate Complexity: 1 Procedure PT Treatments $Gait Training: 8-22 mins   PT G Codes:       Colon Branch, SPT Colon Branch 02/07/2016, 4:19 PM

## 2016-02-07 NOTE — Progress Notes (Signed)
Received patient from 64M. PTA had c/o chest pain and was medicated prior to leaving the unit. Sat up in recliner. No c/o voiced. Reported that her chest pain was much better.

## 2016-02-07 NOTE — Progress Notes (Signed)
[place] Progress Note Patient Name: Penny James DOB: 26-Sep-1951 MRN: [id number]   Date of Service  02/07/2016  HPI/Events of Note  C/o chest discomfort.  BP 107/62 mmHg  Pulse 86  Temp(Src) 98.7 F (37.1 C) (Oral)  Resp 24  Ht 5\' 3"  (1.6 m)  Wt 63.5 kg (139 lb 15.9 oz)  BMI 24.80 kg/m2  SpO2 100%  On NTG gtt.  Has been seen by cardiology >> not candidate for interventions.   eICU Interventions  Will order prn morphine IV.     Intervention Category Major Interventions: Other:  Marketta Valadez 02/07/2016, 4:59 AM

## 2016-02-07 NOTE — Consult Note (Signed)
Consultation Note Date: 02/07/2016   Patient Name: Penny James  DOB: [birth year]  MRN: [id number]  Age / Sex: 65 y.o., female  PCP: Lucretia Kern, DO Referring Physician: Collene Gobble, MD  Reason for Consultation: Establishing goals of care, Non pain symptom management and Psychosocial/spiritual support    Clinical Assessment/Narrative: Penny James is a 65 y/o female with a hx of CAD s/p CABG 2010, combined CHF (echo 2016 with EF 20-25%, grade 2 DD), CKD III, DM, and recent dx of breast cancer (IDC with DCIS, recommendation for adjuvant chemoradiation followed by breast-conserving surgery with sentinel LN biopsy) who presented on 4/6 to the ED with increasing chest pain, dyspnea, diaphoresis, orthopnea, and cough with clear sputum for 1 day. No relief of symptoms with BiPAP in the ED, so she was intubated and admitted by [facility] for treatment of acute on chronic combined heart failure complicated by acute hypoxic respiratory failure.  She has been undergoing diuresis with IV Lasix with good results and was able to be extubated on 4/10- now on 2L O2 by nasal cannula (she was not on home oxygen prior to admission). Cardiology was consulted for patient's diffuse CAD- believed due to tight distal LAD stenosis- and cath was originally planned. She developed gross hematuria and cath was cancelled for the time being. Urology consulted for hematuria-  Believed due to combination of multiple blood thinners (ASA, plavix, heparin), possible foley trauma, and UTI. Heparin was stopped, urine culture pending- she is asymptomatic for UTI and was started on Bactrim.  During my examination she was awake, alert, and very pleasant. Prior to this admission, she was completely independent and able to complete ADLs without difficulty. She has been married to her husband for over 40 years- he is on continuous home oxygen. They have 3 children  between them- 2 sons and 1 daughter- who live in the area. Her sons both live in [place] and have been able to visit her often. They have a very close family and she feels that everyone is understanding of the situation.  She considers herself a spiritual person and is an active member in the [employer]. Her niece and nephew own their own church here in [place], and have been able to come and see her during her stay in the hospital. She declines need to meet with a hospital chaplain and feels very at peace with where she is physically and spiritually.   She is tolerating PO food and drink, though states that she hasn't had much of an appetite recently and occasionally gets nauseous from eating- has lost 20 lbs over the past month. She denies any pain- her main concerns are inability to lay flat while sleeping and a cough which got worse after extubation. She also has not had a bowel movement in several days and is normally regular with her bowel habits.  We discussed code status in detail and the patient elected DNR.  She reports that if she were unable to make her own medical decisions she would want her sons to make her decisions for her.  Contacts/Participants in Discussion:  Patient and PMT PA and [place] PA student. Primary Decision Maker: Jackey Loge- patient Relationship to Patient Self   SUMMARY OF RECOMMENDATIONS I advised her that our service is available if she or any of her family members have questions or want to talk about her future. We discussed creating a Living Will as a gift to her family, providing them with her direct wants and  wishes for how her end of life care will be carried out. Discussed with her that with discharge planning, we will work to make sure she is comfortable at home and provided with the necessary supplies for being so - such as a hospital bed. We will continue to follow-up with Penny James.  Code Status/Advance Care Planning: DNR    Code Status  Orders        Start     Ordered   02/07/16 1414  Do not attempt resuscitation (DNR)   Continuous    Question Answer Comment  In the event of cardiac or respiratory ARREST Do not call a "code blue"   In the event of cardiac or respiratory ARREST Do not perform Intubation, CPR, defibrillation or ACLS   In the event of cardiac or respiratory ARREST Use medication by any route, position, wound care, and other measures to relive pain and suffering. May use oxygen, suction and manual treatment of airway obstruction as needed for comfort.      02/07/16 1413     Other Directives:None  Symptom Management:   Pain: She currently has no pain. We'll defer to attending and palliative medicine would be happy to assist in any symptom management should the need arise  Cough: PRN Tussionex  Orthopnea: We discussed that her increasing orthopnea is likely from her heart failure and recommended sleeping in an upright position- she feels much better in this position and is able to sleep  Constipation: offered suppository for constipation- she declined at this time, wishing to wait until she is able to get up and use the restroom by herself  Palliative Prophylaxis:   Bowel Regimen and Frequent Pain Assessment  Psycho-social/Spiritual:  Support System: Strong Desire for further Chaplaincy support:No Additional Recommendations: Caregiving  Support/Resources  Prognosis:   Less than 6 months - based on end stage mixed heart failure   Discharge Planning: TBD   Chief Complaint/ Primary Diagnoses: Present on Admission:  . Acute respiratory failure ([place]) . Cardiomyopathy, ischemic-new 12/16 . Benign essential HTN . Mixed hyperlipidemia  I have reviewed the medical record, interviewed the patient and family, and examined the patient. The following aspects are pertinent.  Past Medical History  Diagnosis Date  . Hyperlipidemia   . Arthritis   . Anemia   . Chronic combined systolic and diastolic  CHF (congestive heart failure) ([place])     a. EF 55% by cath 2010 but in 09/2015 found to be 20%, with mod-severe MR, grade 2 DD.  . Bipolar affective disorder ([place])   . GERD (gastroesophageal reflux disease)   . Obesity   . Peripheral neuropathy ([place])   . 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 CAD (coronary artery disease)     a. CABG in 2002. b. Cath 09/2015: poor targets, not a good surgical candidate, high risk PCI reserved for recurrent symptoms.  . Carotid artery stenosis     60-79% bilateral ICA stenosis s/p Right CEA 2013  followed by Dr. Donnetta Hutching  . Mitral regurgitation     a. Mod-severe by echo 09/2015.  . Ischemic cardiomyopathy   . CKD (chronic kidney disease), stage III   . Breast cancer ([place])   . Elevated troponin 02/06/2016   Social History   Social History  . Marital Status: Married    Spouse Name: N/A  . Number of  Children: 3  . Years of Education: N/A   Occupational History  . DISABLED    Social History Main Topics  . Smoking status: Never Smoker   . Smokeless tobacco: Never Used  . Alcohol Use: No  . Drug Use: No  . Sexual Activity: No   Other Topics Concern  . None   Social History Narrative   Family History  Problem Relation Age of Onset  . Heart disease Brother     Heart Disease before age 1  . Glaucoma Father   . Hypertension Father   . Stroke Paternal Grandmother   . Coronary artery disease Paternal Grandmother   . Cerebral aneurysm Brother   . Clotting disorder Sister    Scheduled Meds: . aspirin  81 mg Per Tube Daily  . carvedilol  3.125 mg Oral BID WC  . clopidogrel  75 mg Per Tube Daily  . furosemide  60 mg Intravenous BID  . insulin aspart  0-15 Units Subcutaneous 6 times per day  . isosorbide mononitrate  60 mg Oral Daily  . nitroGLYCERIN  1 inch Topical 4 times per day  . pantoprazole  40  mg Oral Daily  . rosuvastatin  40 mg Oral q1800  . sodium chloride flush  3 mL Intravenous Q12H   Continuous Infusions: . sodium chloride Stopped (02/07/16 0600)  . sodium chloride Stopped (02/07/16 0800)  . fentaNYL infusion INTRAVENOUS Stopped (02/07/16 0700)  . nitroGLYCERIN Stopped (02/07/16 1300)   PRN Meds:.sodium chloride, albuterol, chlorpheniramine-HYDROcodone, fentaNYL, midazolam, midazolam, morphine injection, sodium chloride flush Medications Prior to Admission:  Prior to Admission medications   Medication Sig Start Date End Date Taking? Authorizing Provider  ALDACTONE 25 MG tablet Take 0.5 tablets (12.5 mg total) by mouth daily. 12/15/15  Yes Sueanne Margarita, MD  aspirin EC 81 MG tablet Take 81 mg by mouth daily.   Yes Historical Provider, MD  carvedilol (COREG) 6.25 MG tablet Take 3 tablets (18.75 mg total) by mouth 2 (two) times daily with a meal. 01/19/16  Yes Traci R Turner, MD  CRESTOR 40 MG tablet TAKE 1 TABLET BY MOUTH EVERY DAY 09/14/15  Yes Sueanne Margarita, MD  ferrous sulfate 325 (65 FE) MG tablet Take 325 mg by mouth daily with breakfast.   Yes Historical Provider, MD  furosemide (LASIX) 80 MG tablet Take 1 tablet (80 mg total) by mouth every morning. 10/10/15  Yes Luke K Kilroy, PA-C  lansoprazole (PREVACID) 15 MG capsule Take 15 mg by mouth daily.   Yes Historical Provider, MD  Multiple Vitamins-Minerals (MULTIVITAL) tablet Take 1 tablet by mouth daily.    Yes Historical Provider, MD  nitroGLYCERIN (NITROSTAT) 0.4 MG SL tablet Place 1 tablet (0.4 mg total) under the tongue every 5 (five) minutes as needed for chest pain. 10/10/15  Yes Luke K Kilroy, PA-C  olopatadine (PATANOL) 0.1 % ophthalmic solution Place 1 drop into both eyes 2 (two) times daily as needed for allergies.   Yes Historical Provider, MD  PLAVIX 75 MG tablet TAKE 1 TABLET BY MOUTH EVERY DAY 12/19/15  Yes Sueanne Margarita, MD  ZESTRIL 2.5 MG tablet Take 2.5 mg by mouth daily. 11/20/15  Yes Historical  Provider, MD  ZETIA 10 MG tablet TAKE 1 TABLET BY MOUTH EVERY DAY 11/15/15  Yes Sueanne Margarita, MD  BAYER MICROLET LANCETS lancets Use as instructed 08/19/15   Lucretia Kern, DO  doxycycline (VIBRAMYCIN) 100 MG capsule Take 1 capsule (100 mg total) by mouth 2 (two) times  daily. Patient not taking: Reported on 02/02/2016 01/26/16   Lucretia Kern, DO  Glucose Blood (BAYER BREEZE 2 TEST) DISK Test blood sugar four times a day 08/19/15   Lucretia Kern, DO  isosorbide mononitrate (IMDUR) 30 MG 24 hr tablet Take 1 tablet (30 mg total) by mouth daily. OK to use name brand Imdur if pt prefers. Patient not taking: Reported on 02/02/2016 12/30/15   Evelene Croon Barrett, PA-C   Allergies  Allergen Reactions  . Penicillins Rash    Has patient had a PCN reaction causing immediate rash, facial/tongue/throat swelling, SOB or lightheadedness with hypotension: Yes Has patient had a PCN reaction causing severe rash involving mucus membranes or skin necrosis: Yes Has patient had a PCN reaction that required hospitalization Yes- admitted to hospital Has patient had a PCN reaction occurring within the last 10 years: No, childhood allergy If all of the above answers are "NO", then may proceed with Cephalosporin use.   . Protamine     Severe hypotension Internal bleeding around heart  . Amlodipine Swelling    Causes swelling in higher dosage    Review of Systems  Constitutional: Positive for appetite change. Negative for fever.  Respiratory: Positive for cough and shortness of breath. Negative for choking and chest tightness.   Cardiovascular: Positive for chest pain. Negative for palpitations and leg swelling.  Gastrointestinal: Positive for nausea and constipation. Negative for vomiting, abdominal pain and abdominal distention.  Genitourinary: Positive for hematuria. Negative for dysuria, frequency and flank pain.  Psychiatric/Behavioral: Negative for confusion. The patient is not nervous/anxious.     Physical Exam   Constitutional: Awake, alert, no apparent distress. Answering questions appropriately, pleasant and cooperative. Alert and oriented x3. Neck: Mild JVD Respiratory: Minimal crackles, on 2L O2 by nasal cannula Cardiovascular: RRR Extremities: Warm with pulses intact  Vital Signs: BP 111/58 mmHg  Pulse 87  Temp(Src) 98.2 F (36.8 C) (Oral)  Resp 25  Ht 5\' 3"  (1.6 m)  Wt 63.5 kg (139 lb 15.9 oz)  BMI 24.80 kg/m2  SpO2 95%  SpO2: SpO2: 95 % O2 Device:SpO2: 95 % O2 Flow Rate: .O2 Flow Rate (L/min): 2 L/min  IO: Intake/output summary:   Intake/Output Summary (Last 24 hours) at 02/07/16 1620 Last data filed at 02/07/16 1400  Gross per 24 hour  Intake 529.45 ml  Output   1325 ml  Net -795.55 ml    LBM: Last BM Date:  (PTA) Baseline Weight: Weight: 67.132 kg (148 lb) Most recent weight: Weight: 63.5 kg (139 lb 15.9 oz)      Palliative Assessment/Data:  Flowsheet Rows        Most Recent Value   Intake Tab    Referral Department  Critical care   Unit at Time of Referral  ICU   Palliative Care Primary Diagnosis  Cardiac   Date Notified  02/07/16   Palliative Care Type  New Palliative care   Reason for referral  Clarify Goals of Care   Date of Admission  02/02/16   Date first seen by Palliative Care  02/07/16   # of days Palliative referral response time  0 Day(s)   # of days IP prior to Palliative referral  5   Clinical Assessment    Palliative Performance Scale Score  40%   Psychosocial & Spiritual Assessment    Palliative Care Outcomes    Patient/Family meeting held?  Yes   Who was at the meeting?  Patient and PMT   Palliative Care Outcomes  Clarified goals of care, Changed CPR status   Patient/Family wishes: Interventions discontinued/not started   Mechanical Ventilation      Additional Data Reviewed:  CBC:    Component Value Date/Time   WBC 6.4 02/07/2016 0213   WBC 3.6* 01/11/2016 0911   HGB 8.9* 02/07/2016 0213   HGB 10.8* 01/11/2016 0911   HCT 27.7*  02/07/2016 0213   HCT 31.7* 01/11/2016 0911   PLT 140* 02/07/2016 0213   PLT 146 01/11/2016 0911   MCV 81.7 02/07/2016 0213   MCV 81.3 01/11/2016 0911   NEUTROABS 2.0 01/11/2016 0911   NEUTROABS 3.5 12/29/2015 0607   LYMPHSABS 1.2 01/11/2016 0911   LYMPHSABS 1.2 12/29/2015 0607   MONOABS 0.3 01/11/2016 0911   MONOABS 0.4 12/29/2015 0607   EOSABS 0.1 01/11/2016 0911   EOSABS 0.1 12/29/2015 0607   BASOSABS 0.0 01/11/2016 0911   BASOSABS 0.0 12/29/2015 0607   Comprehensive Metabolic Panel:    Component Value Date/Time   NA 141 02/07/2016 0213   NA 134* 01/11/2016 0911   K 3.9 02/07/2016 0213   K 4.2 01/11/2016 0911   CL 102 02/07/2016 0213   CO2 26 02/07/2016 0213   CO2 29 01/11/2016 0911   BUN 37* 02/07/2016 0213   BUN 20.8 01/11/2016 0911   CREATININE 1.34* 02/07/2016 0213   CREATININE 1.3* 01/11/2016 0911   CREATININE 1.16* 01/06/2016 1326   GLUCOSE 109* 02/07/2016 0213   GLUCOSE 155* 01/11/2016 0911   CALCIUM 9.3 02/07/2016 0213   CALCIUM 9.1 01/11/2016 0911   AST 106* 02/07/2016 0213   AST 25 01/11/2016 0911   ALT 25 02/07/2016 0213   ALT 25 01/11/2016 0911   ALKPHOS 41 02/07/2016 0213   ALKPHOS 58 01/11/2016 0911   BILITOT 1.0 02/07/2016 0213   BILITOT 0.54 01/11/2016 0911   PROT 5.6* 02/07/2016 0213   PROT 6.6 01/11/2016 0911   ALBUMIN 2.8* 02/07/2016 0213   ALBUMIN 4.0 01/11/2016 0911     Time In: 13:45 Time Out: 14:30 Time Total: 45 minutes Greater than 50%  of this time was spent counseling and coordinating care related to the above assessment and plan.  Signed by:  Melton Alar, PA-C  02/07/2016, 4:20 PM  Please contact Palliative Medicine Team phone at [phone] for questions and concerns.

## 2016-02-07 NOTE — Progress Notes (Signed)
PULMONARY / CRITICAL CARE MEDICINE   Name: Penny James MRN: [id number] DOB: Mar 22, 1951    ADMISSION DATE:  02/02/2016  REFERRING MD:  EDP PCP - Dr. 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  In ER she had marked respiratory distress.  She was treated with asa, nitro gtt, lasix IV. First troponin wnl. BNP 1635.  She continued to worsen despite attempts at bipap and was intubated.  PCCM called to admit.    SUBJECTIVE:  Successfully extubated CP overnight on NTG gtt, morphine added  VITAL SIGNS: BP 119/67 mmHg  Pulse 81  Temp(Src) 98 F (36.7 C) (Oral)  Resp 24  Ht 5\' 3"  (1.6 m)  Wt 63.5 kg (139 lb 15.9 oz)  BMI 24.80 kg/m2  SpO2 100%  HEMODYNAMICS:    VENTILATOR SETTINGS:    INTAKE / OUTPUT: I/O last 3 completed shifts: In: 2199.5 [I.V.:1379.5; NG/GT:520; IV Piggyback:300] Out: [id number] [Urine:1695]  PHYSICAL EXAMINATION: General:  Chronically ill appearing female, NAD Neuro:  Alert and following commands, non-focal Cardiovascular:  s1s2 rrr,  Lungs:  Minimal crackles, Abdomen:  Round, soft, +BS Musculoskeletal:   No pitting edema   LABS:  BMET  Recent Labs Lab 02/05/16 1007 02/06/16 0350 02/07/16 0213  NA 137 138 141  K 3.5 3.4* 3.9  CL 99* 100* 102  CO2 26 27 26   BUN 33* 38* 37*  CREATININE 1.31* 1.27* 1.34*  GLUCOSE 133* 162* 109*    Electrolytes  Recent Labs Lab 02/03/16 0300 02/04/16 0233 02/05/16 1007 02/06/16 0350 02/07/16 0213  CALCIUM 9.3 8.6* 9.3 9.0 9.3  MG 2.0 2.2 2.2  --   --   PHOS 3.0 4.0 3.3  --   --     CBC  Recent Labs Lab 02/05/16 1746  02/06/16 0350 02/07/16 0213  WBC 6.5 7.5 6.4  HGB 9.8* 9.1* 8.9*  HCT 30.3* 27.5* 27.7*  PLT 121* 118* 140*    Coag's  Recent Labs Lab 02/02/16 0700 02/07/16 0213  INR 1.19 1.27    Sepsis Markers No results for input(s): LATICACIDVEN, PROCALCITON, O2SATVEN in the last 168 hours.  ABG  Recent Labs Lab 02/02/16 1206 02/03/16 0420 02/03/16 1037  PHART 7.298* 7.596* 7.515*  PCO2ART 50.7* 21.5* 30.3*  PO2ART 326.0* 203* 205.0*    Liver Enzymes  Recent Labs Lab 02/02/16 0700 02/07/16 0213  AST 28 106*  ALT 22 25  ALKPHOS 56 41  BILITOT 0.9 1.0  ALBUMIN 4.2 2.8*    Cardiac Enzymes  Recent Labs Lab 02/02/16 1159 02/03/16  TROPONINI 0.14* 1.61*    Glucose  Recent Labs Lab 02/06/16 1509 02/06/16 2024 02/06/16 2321 02/07/16 0313 02/07/16 0724 02/07/16 1117  GLUCAP 145* 112* 110* 104* 115* 108*    Imaging Dg Chest Port 1 View  02/07/2016  CLINICAL DATA:  Dyspnea EXAM: PORTABLE CHEST 1 VIEW COMPARISON:  02/06/2016 FINDINGS: There is moderate cardiomegaly, unchanged. The lungs are clear except for minor linear basilar opacities. There is no large effusion. There is no pneumothorax. Endotracheal tube and nasogastric tube have been removed. IMPRESSION: Removal of ET and NG tubes. The lungs are grossly clear.  No pneumothorax. Electronically Signed   By: Andreas Newport M.D.   On: 02/07/2016 05:54    STUDIES:  CXC 04/06 > mild pulmonary edema. CXC 04/10 > persistent atelectasis or infiltrate in right middle and lower lobes   CULTURES: None.  ANTIBIOTICS: None.  SIGNIFICANT EVENTS: 4/6 admitted for sob,cp. Intubated. NSTEMI.  4/10 SBT and extubation   LINES/TUBES: ETT 04/06 > 04/10  DISCUSSION: 65 y.o. 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   Admitted 04/06 with SOB secondary to CHF exacerbation.  Concern for decompensated heart failure. She has been diuresing well on lasix 40 mg BID.   ASSESSMENT / PLAN:  CARDIOVASCULAR A/P:  Acute on chronic combined systolic and diastolic heart failure - EF 20% with diffuse hypokinesis (01/2016) - appreciate Dr Tamala Julian and Dr Theodosia Blender input. No good targets for effective PTCI, plan is medical management for her severe CAD -continue outpatient ASA and plavix -Lasix will decrease to 40mg  IV bid, plan to convert to PO regimen soon - attempt to wean off NTG gtt, place NTG paste and start Imdur on 4/11  PULMONARY A: Acute hypoxic respiratory failure - presumed due to CHF exacerbation. Failed BiPAP in ED and required intubation. Pulmonary edema.  P:  -pulm toilet -continue diuresis, adjust as above -albuterol PRN   RENAL A:  Hypokalemia P:  -replete K -lasix to 40mg  bid on 4/11  GASTROINTESTINAL A:  GERD. Obesity. Nutrition. P:  SUP: Pantoprazole. Heart healthy diet  HEMATOLOGIC / ONCOLOGIC A:  Hx breast CA (invasive ductal carcinoma with DCIS, under the care of Dr. Lindi Adie.) - recommended adjuvant chemo, radiation. Oncology recommending breast conserving surgery with sentinel lymph node bx. Has not started treatment, ? Whether she wants treatment Pancytopenia, ? Due to malignancy vs acute illness VTE Prophylaxis. P:  -stopped heparin gtt -SCD's  -coordinate outpatient appointment with Dr Lindi Adie upon discharge   INFECTIOUS A:  No indication of infection currently. P:  Monitor clinically. Will consider empiric abx if she declines in any way  ENDOCRINE A:  DM.  P:  SSI. -CBGs have been stable   NEUROLOGIC A:  Currently alert and oriented  Hx bipolar affective disorder, peripheral neuropathy, carotid artery stenosis. - has been on versed and fentanyl prn P:  -stop fentanyl and versed once extubated  Plan transfer to SDU bed with  efforts to wean NTG gtt to off. To TRH if she is able to transfer out   Baltazar Apo, MD, PhD 02/07/2016, 12:37 PM [facility] [phone] or if no answer [phone]

## 2016-02-07 NOTE — Progress Notes (Signed)
Nutrition Follow-up  DOCUMENTATION CODES:   Not applicable  INTERVENTION:    Continue heart healthy diet, supplement with Ensure Enlive BID if intake is poor.  D/C TF: no access, diet has been advanced.  NUTRITION DIAGNOSIS:   Inadequate oral intake related to inability to eat as evidenced by NPO status.  Ongoing  GOAL:   Patient will meet greater than or equal to 90% of their needs  Unmet  MONITOR:   PO intake, Weight trends, Labs, I & O's  REASON FOR ASSESSMENT:   Consult Enteral/tube feeding initiation and management  ASSESSMENT:   65 y.o. 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 Required intubation.  Discussed patient in ICU rounds and with RN today. Patient was extubated on 4/10. Diet has been advanced to heart healthy. Patient did well with medications this AM per discussion with RN.   Diet Order:  Diet Heart Room service appropriate?: Yes; Fluid consistency:: Thin  Skin:  Reviewed, no issues  Last BM:  PTA  Height:   Ht Readings from Last 1 Encounters:  02/02/16 5\' 3"  (1.6 m)    Weight:   Wt Readings from Last 1 Encounters:  02/07/16 139 lb 15.9 oz (63.5 kg)    Ideal Body Weight:  52.3 kg  BMI:  Body mass index is 24.8 kg/(m^2).  Estimated Nutritional Needs:   Kcal:  1600-1800  Protein:  80-90 gm  Fluid:  1.6-1.8 L  EDUCATION NEEDS:   No education needs identified at this time  Molli Barrows, [place], [place], [place] Pager [phone] After Hours Pager [phone]

## 2016-02-08 ENCOUNTER — Ambulatory Visit (HOSPITAL_COMMUNITY): Payer: Medicare Other

## 2016-02-08 DIAGNOSIS — R0603 Acute respiratory distress: Secondary | ICD-10-CM | POA: Insufficient documentation

## 2016-02-08 DIAGNOSIS — R0600 Dyspnea, unspecified: Secondary | ICD-10-CM

## 2016-02-08 LAB — BASIC METABOLIC PANEL
Anion gap: 13 (ref 5–15)
BUN: 46 mg/dL — ABNORMAL HIGH (ref 6–20)
CHLORIDE: 98 mmol/L — AB (ref 101–111)
CO2: 27 mmol/L (ref 22–32)
CREATININE: 1.55 mg/dL — AB (ref 0.44–1.00)
Calcium: 9.6 mg/dL (ref 8.9–10.3)
GFR calc non Af Amer: 34 mL/min — ABNORMAL LOW (ref >60)
GFR, EST AFRICAN AMERICAN: 39 mL/min — AB (ref >60)
Glucose, Bld: 138 mg/dL — ABNORMAL HIGH (ref 65–99)
POTASSIUM: 4.4 mmol/L (ref 3.5–5.1)
SODIUM: 138 mmol/L (ref 135–145)

## 2016-02-08 LAB — MAGNESIUM: Magnesium: 2.3 mg/dL (ref 1.7–2.4)

## 2016-02-08 LAB — CULTURE, BLOOD (ROUTINE X 2)
CULTURE: NO GROWTH
Culture: NO GROWTH

## 2016-02-08 LAB — GLUCOSE, CAPILLARY
GLUCOSE-CAPILLARY: 131 mg/dL — AB (ref 65–99)
GLUCOSE-CAPILLARY: 149 mg/dL — AB (ref 65–99)
Glucose-Capillary: 118 mg/dL — ABNORMAL HIGH (ref 65–99)
Glucose-Capillary: 136 mg/dL — ABNORMAL HIGH (ref 65–99)

## 2016-02-08 MED ORDER — FUROSEMIDE 80 MG PO TABS
80.0000 mg | ORAL_TABLET | Freq: Every day | ORAL | Status: DC
  Administered 2016-02-09: 80 mg via ORAL
  Filled 2016-02-08: qty 1

## 2016-02-08 MED ORDER — AMIODARONE HCL 200 MG PO TABS
400.0000 mg | ORAL_TABLET | Freq: Two times a day (BID) | ORAL | Status: DC
  Administered 2016-02-08 – 2016-02-11 (×7): 400 mg via ORAL
  Filled 2016-02-08 (×8): qty 2

## 2016-02-08 MED ORDER — CARVEDILOL 6.25 MG PO TABS
6.2500 mg | ORAL_TABLET | Freq: Two times a day (BID) | ORAL | Status: DC
  Administered 2016-02-08 – 2016-02-11 (×6): 6.25 mg via ORAL
  Filled 2016-02-08 (×7): qty 1

## 2016-02-08 MED ORDER — NITROGLYCERIN 0.4 MG SL SUBL
0.4000 mg | SUBLINGUAL_TABLET | SUBLINGUAL | Status: DC | PRN
  Administered 2016-02-08 – 2016-02-11 (×2): 0.4 mg via SUBLINGUAL
  Filled 2016-02-08 (×3): qty 1

## 2016-02-08 MED ORDER — ISOSORBIDE MONONITRATE ER 60 MG PO TB24
90.0000 mg | ORAL_TABLET | Freq: Every day | ORAL | Status: DC
  Administered 2016-02-09 – 2016-02-11 (×3): 90 mg via ORAL
  Filled 2016-02-08: qty 3
  Filled 2016-02-08 (×2): qty 1

## 2016-02-08 MED ORDER — POTASSIUM CHLORIDE CRYS ER 20 MEQ PO TBCR
40.0000 meq | EXTENDED_RELEASE_TABLET | Freq: Once | ORAL | Status: AC
  Administered 2016-02-08: 40 meq via ORAL
  Filled 2016-02-08: qty 2

## 2016-02-08 NOTE — Evaluation (Signed)
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 In ER she had marked respiratory distress. She was treated with asa, nitro gtt, lasix IV. First troponin wnl. BNP 1635. She continued to worsen despite attempts at bipap and was intubated.   Clinical Impression   PT admitted with see above. Pt currently with functional limitiations due to the deficits listed below (see OT problem list). PTA was independent with all adls. Pt currently deconditioned and educated in detail on energy conservation.  Pt will benefit from skilled OT to increase their independence and safety with adls and balance to allow discharge [place].     Follow Up Recommendations  Home health OT    Equipment Recommendations  None recommended by OT    Recommendations for Other Services       Precautions / Restrictions Precautions Precautions: Fall      Mobility Bed Mobility               General bed mobility comments: in chair on arrival  Transfers Overall transfer level: Needs assistance Equipment used: None Transfers: Sit to/from Stand Sit to Stand: Min guard              Balance                                            ADL Overall ADL's : Needs assistance/impaired Eating/Feeding: Independent   Grooming: Wash/dry hands;Supervision/safety;Sitting       Lower Body Bathing: Minimal assistance;Sit to/from stand           Toilet Transfer: Min guard             General ADL Comments: pt declined ambulation for transfer at  this time. pt asleep on arrival with no po intake of lunch. pt reports fatigue and not feeling able to eat at this time. pt educated in detail with discussion on energy conservation. Pt with handout writing on document at the end of session     Vision     Perception     Praxis      Pertinent Vitals/Pain Pain Assessment: No/denies pain     Hand Dominance Right   Extremity/Trunk Assessment Upper Extremity Assessment Upper Extremity Assessment: Generalized weakness   Lower Extremity Assessment Lower Extremity Assessment: Generalized weakness   Cervical / Trunk Assessment Cervical / Trunk Assessment: Kyphotic   Communication Communication Communication: No difficulties   Cognition Arousal/Alertness: Awake/alert (asleep on arrival) Behavior During Therapy: Flat affect Overall Cognitive Status: Within Functional Limits for tasks assessed                     General Comments       Exercises       Shoulder Instructions      Home Living Family/patient expects to be discharged to:: Private residence Living Arrangements: Spouse/significant other Available Help at Discharge: Family;Available 24 hours/day Type of Home: House Home Access: Level entry     Home  Layout: One level     Bathroom Shower/Tub: Occupational psychologist: Standard     Home Equipment: Shower seat;Grab bars - tub/shower;Hand held shower head   Additional Comments: Pt lives with her husband who is available to help her as needed.       Prior Functioning/Environment Level of Independence: Independent        Comments: Independent with ADL's and IADL's     OT Diagnosis: Generalized weakness   OT Problem List: Decreased strength;Decreased activity tolerance;Impaired balance (sitting and/or standing);Decreased safety awareness;Decreased knowledge of use of DME or AE;Decreased knowledge of precautions;Cardiopulmonary status limiting activity   OT Treatment/Interventions:  Self-care/ADL training;Energy conservation;DME and/or AE instruction;Therapeutic activities;Patient/family education;Balance training    OT Goals(Current goals can be found in the care plan section) Acute Rehab OT Goals Patient Stated Goal: To go home OT Goal Formulation: With patient Time For Goal Achievement: 02/22/16 Potential to Achieve Goals: Good  OT Frequency: Min 2X/week   Barriers to D/C:            Co-evaluation              End of Session Nurse Communication: Mobility status;Precautions  Activity Tolerance: Patient tolerated treatment well Patient left: in chair;with call bell/phone within reach   Time: 1355-1407 OT Time Calculation (min): 12 min Charges:  OT General Charges $OT Visit: 1 Procedure OT Evaluation $OT Eval Moderate Complexity: 1 Procedure G-Codes:    Peri Maris 02-27-16, 2:16 PM   Jeri Modena   OTR/L Pager: [phone] Office: [phone] .

## 2016-02-08 NOTE — Progress Notes (Signed)
[facility] PROGRESS NOTE  Penny [name] DOB: [birth year] DOA: 02/02/2016 PCP: Lucretia Kern., DO  HPI/Brief narrative 65 y.o. 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 Admitted 04/06 with SOB secondary to CHF exacerbation. Concern for decompensated heart failure  Assessment/Plan: 1. Acutely decompensated combined systolic and diastolic CHF exacerbation 1. Patient is continued on scheduled lasix 2. Thus far, net neg 3.7L 3. Wt today 64.8kg today 2. Vtach 1. See previous note 2. Documented HR seems improved at this time 3. Cardiology following and pt has expressed wishes against pacemaker 4. Per previous note, have increased beta blocker and amiodarone has been added by Cardiology 5. Cont to follow lytes and correct as needed 3. GERD 1. Stable 4. Hypokalemia 1. Replaced 2. Continue to follow lytes and correct as needed 5. Hx breast cancer 1. Pt to follow up with Dr. Lindi Adie 6. DM2 1. Cont SSI coverage 7. Hx bipolar 8. DVT prophylaxis 1. SCD's  Code Status: DNR Family Communication: Pt in room Disposition Plan: Uncertain at this time   Consultants:  Critical Care  Cardiology  Palliative Care  Procedures:    Antibiotics: Anti-infectives    None      HPI/Subjective: Reports feeling better today  Objective: Filed Vitals:   02/08/16 1626 02/08/16 1700 02/08/16 1742 02/08/16 1800  BP: 105/55 109/55 113/60 115/65  Pulse: 78 77 80 80  Temp: 97.8 F (36.6 C)     TempSrc: Oral     Resp: 22 22 25 15   Height:      Weight:      SpO2: 100% 98% 99% 99%    Intake/Output Summary (Last 24 hours) at 02/08/16 1859 Last data filed at 02/08/16 1700  Gross per 24 hour  Intake  725.2 ml  Output   1075 ml  Net -349.8 ml   Filed Weights   02/06/16 0500 02/07/16 0347 02/08/16 0439  Weight: 63.2 kg (139 lb 5.3 oz) 63.5 kg (139 lb 15.9 oz) 64.8 kg (142 lb 13.7 oz)    Exam:   General:  Awake, in nad  Cardiovascular: regular, s1, s2  Respiratory: normal resp effort, no wheezing  Abdomen: soft, nondistended  Musculoskeletal: perfused, no clubbing   Data Reviewed: Basic Metabolic Panel:  Recent Labs Lab 02/02/16 1159 02/03/16 0300 02/04/16 0233 02/05/16 1007 02/06/16 0350 02/07/16 0213 02/08/16 1004  NA  --  130* 132* 137 138 141 138  K  --  3.6 3.6 3.5 3.4* 3.9 4.4  CL  --  95* 97* 99* 100* 102 98*  CO2  --  21* 24 26 27 26 27   GLUCOSE  --  70 106* 133* 162* 109* 138*  BUN  --  27* 34* 33* 38* 37* 46*  CREATININE 1.73* 1.63* 1.41* 1.31* 1.27* 1.34* 1.55*  CALCIUM  --  9.3 8.6* 9.3 9.0 9.3 9.6  MG 2.2 2.0 2.2 2.2  --   --  2.3  PHOS 5.4* 3.0 4.0 3.3  --   --   --    Liver Function Tests:  Recent Labs Lab 02/02/16 0700 02/07/16 0213  AST 28 106*  ALT 22 25  ALKPHOS 56 41  BILITOT 0.9 1.0  PROT 6.7 5.6*  ALBUMIN 4.2 2.8*   No results for input(s): LIPASE, AMYLASE in the last 168 hours. No results for input(s): AMMONIA in the  last 168 hours. CBC:  Recent Labs Lab 02/04/16 0233 02/05/16 0233 02/05/16 1746 02/06/16 0350 02/07/16 0213  WBC 5.2 9.3 6.5 7.5 6.4  HGB 9.9* 10.1* 9.8* 9.1* 8.9*  HCT 29.4* 30.3* 30.3* 27.5* 27.7*  MCV 79.2 80.4 80.8 80.6 81.7  PLT 112* 120* 121* 118* 140*   Cardiac Enzymes:  Recent Labs Lab 02/02/16 1159 02/03/16  TROPONINI 0.14* 1.61*   BNP (last 3 results)  Recent Labs  12/29/15 0607 12/30/15 0345 02/02/16 0700  BNP 1860.2* 1975.5* 1635.8*    ProBNP (last 3 results)  Recent Labs  04/01/15 0738  PROBNP 460.0*    CBG:  Recent Labs Lab 02/07/16 1557 02/07/16 2119 02/08/16 0729 02/08/16 1144 02/08/16 1625  GLUCAP 143* 126* 118* 131* 149*    Recent Results (from the past 240 hour(s))  MRSA PCR Screening     Status: None    Collection Time: 02/02/16  1:43 PM  Result Value Ref Range Status   MRSA by PCR NEGATIVE NEGATIVE Final    Comment:        The GeneXpert MRSA Assay (FDA approved for NASAL specimens only), is one component of a comprehensive MRSA colonization surveillance program. It is not intended to diagnose MRSA infection nor to guide or monitor treatment for MRSA infections.   Culture, blood (routine x 2)     Status: None   Collection Time: 02/03/16  9:31 AM  Result Value Ref Range Status   Specimen Description BLOOD BLOOD LEFT FOREARM  Final   Special Requests BOTTLES DRAWN AEROBIC AND ANAEROBIC 5CC  Final   Culture NO GROWTH 5 DAYS  Final   Report Status 02/08/2016 FINAL  Final  Culture, blood (routine x 2)     Status: None   Collection Time: 02/03/16  9:38 AM  Result Value Ref Range Status   Specimen Description BLOOD BLOOD RIGHT FOREARM  Final   Special Requests IN PEDIATRIC BOTTLE 4CC  Final   Culture NO GROWTH 5 DAYS  Final   Report Status 02/08/2016 FINAL  Final  Culture, Urine     Status: Abnormal (Preliminary result)   Collection Time: 02/06/16  4:52 PM  Result Value Ref Range Status   Specimen Description URINE, CATHETERIZED  Final   Special Requests NONE  Final   Culture >=100,000 COLONIES/mL ENTEROCOCCUS SPECIES (A)  Final   Report Status PENDING  Incomplete     Studies: Dg Chest Port 1 View  02/07/2016  CLINICAL DATA:  Dyspnea EXAM: PORTABLE CHEST 1 VIEW COMPARISON:  02/06/2016 FINDINGS: There is moderate cardiomegaly, unchanged. The lungs are clear except for minor linear basilar opacities. There is no large effusion. There is no pneumothorax. Endotracheal tube and nasogastric tube have been removed. IMPRESSION: Removal of ET and NG tubes. The lungs are grossly clear. No pneumothorax. Electronically Signed   By: Andreas Newport M.D.   On: 02/07/2016 05:54    Scheduled Meds: . amiodarone  400 mg Oral BID  . aspirin  81 mg Per Tube Daily  . carvedilol  6.25 mg Oral BID  WC  . clopidogrel  75 mg Per Tube Daily  . furosemide  80 mg Oral Daily  . insulin aspart  0-15 Units Subcutaneous TID WC  . insulin aspart  0-5 Units Subcutaneous QHS  . [START ON 02/09/2016] isosorbide mononitrate  90 mg Oral Daily  . pantoprazole  40 mg Oral Daily  . rosuvastatin  40 mg Oral q1800   Continuous Infusions: . sodium chloride 10 mL/hr at 02/08/16 1520  Active Problems:   Mixed hyperlipidemia   Benign essential HTN   Cardiomyopathy, ischemic-new 12/16   Acute respiratory failure (HCC)   Acute respiratory failure with hypoxia (HCC)   Acute on chronic systolic congestive heart failure (HCC)   Acute hypoxemic respiratory failure (HCC)   Chest pain   Elevated troponin   Endotracheally intubated   Palliative care encounter   Goals of care, counseling/discussion   DNR (do not resuscitate)   Cough    [facility], [facility] Pager [phone]. If 7PM-7AM, please contact night-coverage at [email or web address], password [facility] 02/08/2016, 6:59 PM  LOS: 6 days

## 2016-02-08 NOTE — Progress Notes (Signed)
02/08/2016  Central monitor called patient had 23 runs of vtach at 0849. Rn went in patient room she said her pain was a run and before nurse came in room she stated her heart was racing a little, but it was not when RN arrive in room. Patient did not c/o c/p or having any short ness of breath. Patient vital at 0855 was 112/58, 100, HR 99 and RR  25. Dr Wyline Copas (Internal Medicine) was made aware and Dr Radford Pax (Cardiology) was called and was made aware. [facility] RN.

## 2016-02-08 NOTE — Treatment Plan (Signed)
Called by RN regarding 23 run of vtach this AM around 0849. Currently stable. Will check Mg level (was normal 3 days ago). Will give one dose of K to keep above 4. Coreg increased to 6.25mg  bid (was 3.125mg ). Cardiology already following. Will defer other orders to Cardiology.

## 2016-02-08 NOTE — Progress Notes (Signed)
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 In ER she had marked respiratory distress. She was treated with asa, nitro gtt, lasix IV. First troponin wnl. BNP 1635. She continued to worsen despite attempts at bipap and was intubated.    PT Comments    Pt admitted with above diagnosis. Pt currently with functional limitations due to balance and endurance deficits.  Pt was able to ambulate with more steady gait today but still would like a RW for home use.  Continue to progress pt.   Pt will benefit from skilled PT to increase their independence and safety with mobility to allow discharge to the venue listed below.    Follow Up Recommendations  Home health PT;Supervision for mobility/OOB     Equipment Recommendations  Rolling walker with 5" wheels    Recommendations for Other Services OT consult     Precautions / Restrictions Precautions Precautions: Fall Restrictions Weight Bearing Restrictions: No    Mobility  Bed Mobility               General bed mobility comments: In chair on arrival  Transfers Overall transfer level: Needs assistance Equipment used: None Transfers: Sit to/from Stand Sit to Stand: Min guard         General transfer comment: No problem with sit to stand.  No need to assist for balance even though guard assist provided.   Ambulation/Gait Ambulation/Gait assistance: Min assist Ambulation Distance (Feet): 250 Feet Assistive device: None Gait Pattern/deviations: Step-through pattern;Decreased stride  length Gait velocity: decreased Gait velocity interpretation: Below normal speed for age/gender General Gait Details: Slight unsteadiness with gait, not reliant on bilateral UE support entire time but at times pt was unsteady needing UE support. Pt overall would benefit from RW at home and pt agrees.  However, much steadier today.     Stairs            Wheelchair Mobility    Modified Rankin (Stroke Patients Only)       Balance Overall balance assessment: Needs assistance Sitting-balance support: No upper extremity supported;Feet supported Sitting balance-Leahy Scale: Good     Standing balance support: During functional activity;No upper extremity supported Standing balance-Leahy Scale: Fair Standing balance comment: can stand statically up to a minute without Ue support.              High level balance activites: Direction changes;Turns;Sudden stops High Level Balance Comments: Pt able to perform above with min guard assist.     Cognition Arousal/Alertness: Awake/alert Behavior During Therapy: WFL for tasks assessed/performed Overall Cognitive Status: Within Functional Limits for tasks assessed                      Exercises General Exercises - Lower Extremity Ankle Circles/Pumps: AROM;Both;10 reps;Seated Long Arc Quad: AROM;Both;10 reps;Seated Hip Flexion/Marching: AROM;Both;10 reps;Seated    General Comments        Pertinent Vitals/Pain Pain Assessment: No/denies pain  VSS with sats 96-98% on RA with ambulation.  Nurse agreed to leave O2 off after walk.  Home Living                      Prior Function            PT Goals (current goals can now be found in the care plan section) Progress towards PT goals: Progressing toward goals    Frequency  Min 3X/week    PT Plan Current plan remains appropriate    Co-evaluation             End of Session Equipment Utilized During Treatment: Gait belt Activity Tolerance:  Patient tolerated treatment well Patient left: in chair;with call bell/phone within reach;with nursing/sitter in room     Time: 0952-1007 PT Time Calculation (min) (ACUTE ONLY): 15 min  Charges:  $Gait Training: 8-22 mins                    G CodesIrwin James F 2016-02-29, 10:41 AM Penny James Acute Rehabilitation [phone] (pager)

## 2016-02-08 NOTE — Progress Notes (Signed)
SUBJECTIVE: Had more CP last night that radiated into her arms and was placed back on IV NTG  OBJECTIVE:   Vitals:   Filed Vitals:   02/08/16 0000 02/08/16 0400 02/08/16 0439 02/08/16 0700  BP: 121/70 136/74  120/59  Pulse: 80 92  81  Temp:  97.6 F (36.4 C)  97.5 F (36.4 C)  TempSrc:  Oral  Oral  Resp: 22 24  23   Height:      Weight:   142 lb 13.7 oz (64.8 kg)   SpO2: 97% 100%  100%   I&O's:   Intake/Output Summary (Last 24 hours) at 02/08/16 [id number] Last data filed at 02/08/16 0600  Gross per 24 hour  Intake   65.2 ml  Output   1025 ml  Net -959.8 ml   TELEMETRY: Reviewed telemetry pt in NSR with 23 beat run of VT:     PHYSICAL EXAM General: Well developed, well nourished, in no acute distress Head: Eyes PERRLA, No xanthomas.   Normal cephalic and atramatic  Lungs:   Clear bilaterally to auscultation and percussion. Heart:   HRRR S1 S2 Pulses are 2+ & equal. Abdomen: Bowel sounds are positive, abdomen soft and non-tender without masses  Extremities:   No clubbing, cyanosis or edema.  DP +1 Neuro: Alert and oriented X 3. Psych:  Good affect, responds appropriately   LABS: Basic Metabolic Panel:  Recent Labs  02/05/16 1007 02/06/16 0350 02/07/16 0213  NA 137 138 141  K 3.5 3.4* 3.9  CL 99* 100* 102  CO2 26 27 26   GLUCOSE 133* 162* 109*  BUN 33* 38* 37*  CREATININE 1.31* 1.27* 1.34*  CALCIUM 9.3 9.0 9.3  MG 2.2  --   --   PHOS 3.3  --   --    Liver Function Tests:  Recent Labs  02/07/16 0213  AST 106*  ALT 25  ALKPHOS 41  BILITOT 1.0  PROT 5.6*  ALBUMIN 2.8*   No results for input(s): LIPASE, AMYLASE in the last 72 hours. CBC:  Recent Labs  02/06/16 0350 02/07/16 0213  WBC 7.5 6.4  HGB 9.1* 8.9*  HCT 27.5* 27.7*  MCV 80.6 81.7  PLT 118* 140*   Cardiac Enzymes: No results for input(s): CKTOTAL, CKMB, CKMBINDEX, TROPONINI in the last 72 hours. BNP: Invalid input(s): POCBNP D-Dimer: No results for input(s): DDIMER in the last 72  hours. Hemoglobin A1C: No results for input(s): HGBA1C in the last 72 hours. Fasting Lipid Panel: No results for input(s): CHOL, HDL, LDLCALC, TRIG, CHOLHDL, LDLDIRECT in the last 72 hours. Thyroid Function Tests: No results for input(s): TSH, T4TOTAL, T3FREE, THYROIDAB in the last 72 hours.  Invalid input(s): FREET3 Anemia Panel: No results for input(s): VITAMINB12, FOLATE, FERRITIN, TIBC, IRON, RETICCTPCT in the last 72 hours. Coag Panel:   Lab Results  Component Value Date   INR 1.27 02/07/2016   INR 1.19 02/02/2016   INR 1.15 12/29/2015    RADIOLOGY: Dg Chest 2 View  02/02/2016  CLINICAL DATA:  Centralized chest pain radiating to LEFT arm since yesterday, shortness of breath, history coronary artery disease post MI and CABG, diabetes mellitus, hypertension EXAM: CHEST  2 VIEW COMPARISON:  12/29/2015 FINDINGS: Enlargement of cardiac silhouette post CABG. Pulmonary vascular congestion. Atherosclerotic calcification aorta. Improved pulmonary edema. No pleural effusion or pneumothorax. Bones demineralized. IMPRESSION: Mild pulmonary edema, improved since previous exam. Electronically Signed   By: Lavonia Dana M.D.   On: 02/02/2016 07:28   Dg Chest [facility] 1 [address]  02/07/2016  CLINICAL DATA:  Dyspnea EXAM: PORTABLE CHEST 1 VIEW COMPARISON:  02/06/2016 FINDINGS: There is moderate cardiomegaly, unchanged. The lungs are clear except for minor linear basilar opacities. There is no large effusion. There is no pneumothorax. Endotracheal tube and nasogastric tube have been removed. IMPRESSION: Removal of ET and NG tubes. The lungs are grossly clear. No pneumothorax. Electronically Signed   By: Andreas Newport M.D.   On: 02/07/2016 05:54   Dg Chest Port 1 View  02/06/2016  CLINICAL DATA:  Acute respiratory failure, intubated patient. EXAM: PORTABLE CHEST 1 VIEW COMPARISON:  Portable chest x-ray of February 04, 2016 FINDINGS: The lungs are adequately inflated. Patchy interstitial density projects medially in  the right lower lobe with a small amount in the medial aspect of the right middle lobe. The left lung is clear. There is no pleural effusion or pneumothorax. The cardiac silhouette remains enlarged. The pulmonary vascularity is not significantly engorged. The endotracheal tube tip lies 4.8 cm above the carina. The esophagogastric tube tip projects below the inferior margin of the image. IMPRESSION: Persistent infiltrate or atelectasis medially in the right middle and lower lobes. Stable cardiomegaly without pulmonary edema. The support tubes are in reasonable position. Electronically Signed   By: David  Martinique M.D.   On: 02/06/2016 07:11   Dg Chest Port 1 View  02/04/2016  CLINICAL DATA:  65 year old female with a history of intubation. Head min 02/02/2016 with respiratory failure. EXAM: PORTABLE CHEST 1 VIEW COMPARISON:  02/03/2016, 02/02/2016 FINDINGS: Cardiomediastinal silhouette unchanged cardiomegaly. Surgical changes of prior median sternotomy and CABG. Calcifications of the aortic arch. Surgical staples project over the left heart border. Unchanged position of the endotracheal tube, which terminates suitably above the carina, measuring 3.3 cm. Overlying EKG leads. Gastric tube unchanged, terminating out of the field of view. Compared to the study of 02/02/2016 there has been improved airspace opacity bilateral lungs. Persisting mixed interstitial airspace opacity at the right medial base partially obscuring the right heart border. No pneumothorax.  No large pleural effusion. IMPRESSION: Endotracheal tube terminates suitably above the carina. Unchanged position of gastric tube. Improved airspace disease compared to the chest x-ray of 02/02/2016. Cardiomegaly Signed, Dulcy Fanny. Earleen Newport, DO [facility] Radiology Electronically Signed   By: Corrie Mckusick D.O.   On: 02/04/2016 06:56   Dg Chest Portable 1 View  02/03/2016  CLINICAL DATA:  Respiratory failure. EXAM:  PORTABLE CHEST 1 VIEW COMPARISON:  02/02/2016. FINDINGS: Endotracheal tube and NG tube in stable position. Prior CABG. Cardiomegaly. Pulmonary vascular prominence with bilateral pulmonary infiltrates/ edema. Slight improvement from prior exam. IMPRESSION: 1. Lines and tubes stable position. 2. Prior CABG. Persistent cardiomegaly and bilateral pulmonary infiltrates/edema. Interim slight improvement from prior exam. Electronically Signed   By: [place]   On: 02/03/2016 07:05   Dg Chest Portable 1 View  02/02/2016  CLINICAL DATA:  Check endotracheal tube placement EXAM: PORTABLE CHEST 1 VIEW COMPARISON:  02/02/2016 FINDINGS: Endotracheal tube is been placed in lies approximately 2 cm above the carina. A nasogastric catheter is coiled within the stomach. Postsurgical changes are seen. Increasing perihilar densities are noted consistent with frank pulmonary edema. No sizable effusion is seen. No bony abnormality is noted. IMPRESSION: Tubes and lines in satisfactory position. Increasing pulmonary edema Electronically Signed   By: Inez Catalina M.D.   On: 02/02/2016 11:50    ASSESSMENT/PLAN:  65 y.o. female with a history of hx of CAD s/p CABG, HTN, diabetes, ischemic cardiomyopathy, combined systolic  and diastolic CHF, Recent dx of breast cancer (plan to start chemo) moderate to severe mitral regurgitation, PAD status post prior renal artery stenting, HL, carotid stenosis status post R CEA, CKD, bipolar affective d/o who presented to [facility] ED for evaluation of SOB and chest pain  1) Acute on chronic systolic heart failure: Diuresing well - net neg 3.7L. Now extubated. Renal function improved with diuresis.Lungs are clear and she has no edema.  Creatinine has bumped slightly.  Will change Lasix to PO.  ACE I and aldactone on hold due to AKI.Continue Coreg.  Coreg dose increased today due to VT.  If BP remains stable will try to add low dose Hydralazine.  Continue long acting nitrates  2. CAD s/p  remote CABG with cath 09/2015 with all vein grafts occluded and LIMA to LAD widely patent but distal LAD with diffuse disease Has a very tight distal LAD stenosis that I suspect is the culprit. It would have to be approached via the LIMA graft. She has diffuse moderate - severe CAD involving the LM, LAD and LCx. She discussed options with Dr. Tamala Julian yesterday and has decided not to pursue PCI and will remain on medical management. She had more CP last night but currently pain free.   - continue ASA/Plavix/statin. - Increase Imdur to 90mg  daily. Ranexa will most likely be cost prohibitive. - patient had long discussion regarding high risk PCI with Dr. Tamala Julian and has decided against it. Will continue with medical management. One thing that has made medical management difficult is that she does not want generic meds but then cost becomes difficult.   3. Hypokalemia - repleted.  Check BMET today  4. Hematuria - ? Etiology. Heparin stopped. Hbg down to 8.9. Resolving after stopping Heparin. Urology consult appreciated.   5. Hyperlipidemia - continue statin.   6. HTN - BP controlled   7. Ischemic DCM EF 20% by echo with NSVT on monitor. She has been on Coreg and Aldactone in the past. She has refused to take Imdur or hydralazine in the past because there is no brand name for these and she is convinced that generic drugs were responsible for her heart problems in the past. If BP tolerates increase of Coreg then will consider adding Bidil.  She is not on max medical therapy but has refused meds in the past as stated above.   8. Carotid artery stenosis with s/p R CEA and now with LICA [id number] - Dr. Donnetta Hutching following.   9.  Nonsustained VT 23 beats.  Check BMET and Mg.  Will start Amio 400mg  BID for suppression of VT.  She has now become DNR.  I have discussed AICD with her and she does not want to pursue this.      Sueanne Margarita, MD  02/08/2016  9:38 AM

## 2016-02-08 NOTE — Progress Notes (Signed)
[place] Progress Note Patient Name: Penny James DOB: 03-27-1951 MRN: [id number]   Date of Service  02/08/2016  HPI/Events of Note  Chest pain with radiation into L arm. Patient on ASA, Coreg and NTG paste. Patient has severe CAD and has declined high risk PCI. Patient has also had hematuria while on a Heparin IV infusion. Therefore, can't re-heparinize. Really tough situation. Prognosis is poor.   eICU Interventions  Will order: 1. NTG SL PRN. 2. NTG IV infusion. 3. D/C Nitropaste. 4. Morphine as already ordered for pain relief if NTG SL/IV infusion not effective.      Intervention Category Intermediate Interventions: Pain - evaluation and management  Sommer,Steven Eugene 02/08/2016, 4:07 AM

## 2016-02-09 ENCOUNTER — Telehealth: Payer: Self-pay | Admitting: Cardiology

## 2016-02-09 ENCOUNTER — Telehealth: Payer: Self-pay | Admitting: Family Medicine

## 2016-02-09 DIAGNOSIS — I5021 Acute systolic (congestive) heart failure: Secondary | ICD-10-CM

## 2016-02-09 DIAGNOSIS — Z515 Encounter for palliative care: Secondary | ICD-10-CM | POA: Insufficient documentation

## 2016-02-09 DIAGNOSIS — K5901 Slow transit constipation: Secondary | ICD-10-CM | POA: Insufficient documentation

## 2016-02-09 DIAGNOSIS — I509 Heart failure, unspecified: Secondary | ICD-10-CM | POA: Insufficient documentation

## 2016-02-09 DIAGNOSIS — I209 Angina pectoris, unspecified: Secondary | ICD-10-CM

## 2016-02-09 LAB — BASIC METABOLIC PANEL
ANION GAP: 13 (ref 5–15)
BUN: 53 mg/dL — AB (ref 6–20)
CHLORIDE: 98 mmol/L — AB (ref 101–111)
CO2: 25 mmol/L (ref 22–32)
Calcium: 9.7 mg/dL (ref 8.9–10.3)
Creatinine, Ser: 1.68 mg/dL — ABNORMAL HIGH (ref 0.44–1.00)
GFR calc Af Amer: 35 mL/min — ABNORMAL LOW (ref >60)
GFR, EST NON AFRICAN AMERICAN: 30 mL/min — AB (ref >60)
GLUCOSE: 133 mg/dL — AB (ref 65–99)
POTASSIUM: 4.7 mmol/L (ref 3.5–5.1)
Sodium: 136 mmol/L (ref 135–145)

## 2016-02-09 LAB — GLUCOSE, CAPILLARY
GLUCOSE-CAPILLARY: 131 mg/dL — AB (ref 65–99)
GLUCOSE-CAPILLARY: 120 mg/dL — AB (ref 65–99)
Glucose-Capillary: 127 mg/dL — ABNORMAL HIGH (ref 65–99)

## 2016-02-09 LAB — URINE CULTURE

## 2016-02-09 LAB — TSH: TSH: 2.491 u[IU]/mL (ref 0.350–4.500)

## 2016-02-09 MED ORDER — HYDROCORTISONE 1 % EX CREA
1.0000 "application " | TOPICAL_CREAM | Freq: Three times a day (TID) | CUTANEOUS | Status: DC | PRN
  Filled 2016-02-09: qty 28

## 2016-02-09 MED ORDER — PHENOL 1.4 % MT LIQD
1.0000 | OROMUCOSAL | Status: DC | PRN
  Filled 2016-02-09: qty 177

## 2016-02-09 MED ORDER — HYDROCOD POLST-CPM POLST ER 10-8 MG/5ML PO SUER
5.0000 mL | Freq: Every day | ORAL | Status: DC
  Administered 2016-02-09 – 2016-02-10 (×2): 5 mL via ORAL
  Filled 2016-02-09 (×2): qty 5

## 2016-02-09 MED ORDER — LORATADINE 10 MG PO TABS: 10.0000 mg | ORAL_TABLET | Freq: Every day | ORAL | Status: DC | PRN

## 2016-02-09 MED ORDER — DOCUSATE SODIUM 100 MG PO CAPS
100.0000 mg | ORAL_CAPSULE | Freq: Two times a day (BID) | ORAL | Status: DC
  Administered 2016-02-09 – 2016-02-11 (×5): 100 mg via ORAL
  Filled 2016-02-09 (×5): qty 1

## 2016-02-09 MED ORDER — HYDRALAZINE HCL 10 MG PO TABS
10.0000 mg | ORAL_TABLET | Freq: Three times a day (TID) | ORAL | Status: DC
Start: 2016-02-09 — End: 2016-02-11
  Administered 2016-02-09 – 2016-02-11 (×6): 10 mg via ORAL
  Filled 2016-02-09 (×7): qty 1

## 2016-02-09 MED ORDER — LIP MEDEX EX OINT
1.0000 "application " | TOPICAL_OINTMENT | CUTANEOUS | Status: DC | PRN
  Filled 2016-02-09: qty 7

## 2016-02-09 MED ORDER — GUAIFENESIN-DM 100-10 MG/5ML PO SYRP: 5.0000 mL | ORAL_SOLUTION | ORAL | Status: DC | PRN

## 2016-02-09 MED ORDER — ALUM & MAG HYDROXIDE-SIMETH 200-200-20 MG/5ML PO SUSP
30.0000 mL | ORAL | Status: DC | PRN
  Administered 2016-02-09: 30 mL via ORAL
  Filled 2016-02-09: qty 30

## 2016-02-09 MED ORDER — SALINE SPRAY 0.65 % NA SOLN
1.0000 | NASAL | Status: DC | PRN
  Filled 2016-02-09: qty 44

## 2016-02-09 MED ORDER — POLYETHYLENE GLYCOL 3350 17 G PO PACK: 17.0000 g | PACK | Freq: Every day | ORAL | Status: DC | PRN

## 2016-02-09 MED ORDER — BLISTEX MEDICATED EX OINT
TOPICAL_OINTMENT | CUTANEOUS | Status: DC | PRN
  Filled 2016-02-09: qty 10

## 2016-02-09 MED ORDER — POLYVINYL ALCOHOL 1.4 % OP SOLN
1.0000 [drp] | OPHTHALMIC | Status: DC | PRN
  Filled 2016-02-09: qty 15

## 2016-02-09 MED ORDER — WITCH HAZEL-GLYCERIN EX PADS
1.0000 "application " | MEDICATED_PAD | CUTANEOUS | Status: DC | PRN
  Filled 2016-02-09: qty 100

## 2016-02-09 NOTE — Progress Notes (Addendum)
02/09/2016  Per Nurse Tech patient voided 125cc at 1630. [facility] RN.

## 2016-02-09 NOTE — Telephone Encounter (Signed)
Message routed to Dr. Cheryle Horsfall, RN to review and advise

## 2016-02-09 NOTE — Telephone Encounter (Signed)
Penny James would like to know if dr Maudie Mercury will be the attending physician for hospice. Hospice md will do management of medication if its ok with dr Maudie Mercury

## 2016-02-09 NOTE — Progress Notes (Signed)
Notified by Babette Relic,  CMRN of  request for [facility] services at home after discharge. Chart and patient information currently under review to confirm hospice eligibility.   Spoke with the patient at bedside to initiate education related to hospice philosophy, services and team approach to care. Penny James  verbalized understanding of the information provided.  Patient did say she would like to go home with  Hospice but  she would first  like to discuss this with her family later this afternoon before making any definite decision.  No set discharge date at this time.  Contact information and hospice pamphlet were left with the patient.   Above information shared with [facility], CMRN.  [facility] liason to follow up with patient again after her discussion with family members.    [facility] aware of the above.   Please call with any questions.  Thank You,   Mickie Kay, [place]  [phone]

## 2016-02-09 NOTE — Progress Notes (Signed)
SUBJECTIVE:  No SOB. Minimal CP with ambulation  OBJECTIVE:   Vitals:   Filed Vitals:   02/09/16 0012 02/09/16 0337 02/09/16 0338 02/09/16 0726  BP: 114/59  119/70 128/65  Pulse: 73  75 77  Temp: 97.5 F (36.4 C)  97.3 F (36.3 C) 97.6 F (36.4 C)  TempSrc: Oral  Oral Oral  Resp: 11  20 29   Height:      Weight:  142 lb 3.2 oz (64.5 kg)    SpO2: 100%  100% 100%   I&O's:   Intake/Output Summary (Last 24 hours) at 02/09/16 1022 Last data filed at 02/09/16 0900  Gross per 24 hour  Intake    720 ml  Output    850 ml  Net   -130 ml   TELEMETRY: Reviewed telemetry pt in NSR:     PHYSICAL EXAM General: Well developed, well nourished, in no acute distress Head: Eyes PERRLA, No xanthomas.   Normal cephalic and atramatic  Lungs:   Clear bilaterally to auscultation and percussion. Heart:   HRRR S1 S2 Pulses are 2+ & equal. Abdomen: Bowel sounds are positive, abdomen soft and non-tender without masses Extremities:   No clubbing, cyanosis or edema.  DP +1 Neuro: Alert and oriented X 3. Psych:  Good affect, responds appropriately   LABS: Basic Metabolic Panel:  Recent Labs  02/08/16 1004 02/09/16 0300  NA 138 136  K 4.4 4.7  CL 98* 98*  CO2 27 25  GLUCOSE 138* 133*  BUN 46* 53*  CREATININE 1.55* 1.68*  CALCIUM 9.6 9.7  MG 2.3  --    Liver Function Tests:  Recent Labs  02/07/16 0213  AST 106*  ALT 25  ALKPHOS 41  BILITOT 1.0  PROT 5.6*  ALBUMIN 2.8*   No results for input(s): LIPASE, AMYLASE in the last 72 hours. CBC:  Recent Labs  02/07/16 0213  WBC 6.4  HGB 8.9*  HCT 27.7*  MCV 81.7  PLT 140*   Cardiac Enzymes: No results for input(s): CKTOTAL, CKMB, CKMBINDEX, TROPONINI in the last 72 hours. BNP: Invalid input(s): POCBNP D-Dimer: No results for input(s): DDIMER in the last 72 hours. Hemoglobin A1C: No results for input(s): HGBA1C in the last 72 hours. Fasting Lipid Panel: No results for input(s): CHOL, HDL, LDLCALC, TRIG, CHOLHDL,  LDLDIRECT in the last 72 hours. Thyroid Function Tests: No results for input(s): TSH, T4TOTAL, T3FREE, THYROIDAB in the last 72 hours.  Invalid input(s): FREET3 Anemia Panel: No results for input(s): VITAMINB12, FOLATE, FERRITIN, TIBC, IRON, RETICCTPCT in the last 72 hours. Coag Panel:   Lab Results  Component Value Date   INR 1.27 02/07/2016   INR 1.19 02/02/2016   INR 1.15 12/29/2015    RADIOLOGY: Dg Chest 2 View  02/02/2016  CLINICAL DATA:  Centralized chest pain radiating to LEFT arm since yesterday, shortness of breath, history coronary artery disease post MI and CABG, diabetes mellitus, hypertension EXAM: CHEST  2 VIEW COMPARISON:  12/29/2015 FINDINGS: Enlargement of cardiac silhouette post CABG. Pulmonary vascular congestion. Atherosclerotic calcification aorta. Improved pulmonary edema. No pleural effusion or pneumothorax. Bones demineralized. IMPRESSION: Mild pulmonary edema, improved since previous exam. Electronically Signed   By: Lavonia Dana M.D.   On: 02/02/2016 07:28   Dg Chest Port 1 View  02/07/2016  CLINICAL DATA:  Dyspnea EXAM: PORTABLE CHEST 1 VIEW COMPARISON:  02/06/2016 FINDINGS: There is moderate cardiomegaly, unchanged. The lungs are clear except for minor linear basilar opacities. There is no large effusion. There is no  pneumothorax. Endotracheal tube and nasogastric tube have been removed. IMPRESSION: Removal of ET and NG tubes. The lungs are grossly clear. No pneumothorax. Electronically Signed   By: Andreas Newport M.D.   On: 02/07/2016 05:54   Dg Chest Port 1 View  02/06/2016  CLINICAL DATA:  Acute respiratory failure, intubated patient. EXAM: PORTABLE CHEST 1 VIEW COMPARISON:  Portable chest x-ray of February 04, 2016 FINDINGS: The lungs are adequately inflated. Patchy interstitial density projects medially in the right lower lobe with a small amount in the medial aspect of the right middle lobe. The left lung is clear. There is no pleural effusion or pneumothorax. The  cardiac silhouette remains enlarged. The pulmonary vascularity is not significantly engorged. The endotracheal tube tip lies 4.8 cm above the carina. The esophagogastric tube tip projects below the inferior margin of the image. IMPRESSION: Persistent infiltrate or atelectasis medially in the right middle and lower lobes. Stable cardiomegaly without pulmonary edema. The support tubes are in reasonable position. Electronically Signed   By: David  Martinique M.D.   On: 02/06/2016 07:11   Dg Chest Port 1 View  02/04/2016  CLINICAL DATA:  65 year old female with a history of intubation. Head min 02/02/2016 with respiratory failure. EXAM: PORTABLE CHEST 1 VIEW COMPARISON:  02/03/2016, 02/02/2016 FINDINGS: Cardiomediastinal silhouette unchanged cardiomegaly. Surgical changes of prior median sternotomy and CABG. Calcifications of the aortic arch. Surgical staples project over the left heart border. Unchanged position of the endotracheal tube, which terminates suitably above the carina, measuring 3.3 cm. Overlying EKG leads. Gastric tube unchanged, terminating out of the field of view. Compared to the study of 02/02/2016 there has been improved airspace opacity bilateral lungs. Persisting mixed interstitial airspace opacity at the right medial base partially obscuring the right heart border. No pneumothorax.  No large pleural effusion. IMPRESSION: Endotracheal tube terminates suitably above the carina. Unchanged position of gastric tube. Improved airspace disease compared to the chest x-ray of 02/02/2016. Cardiomegaly Signed, Dulcy Fanny. Earleen Newport, DO [facility] Radiology Electronically Signed   By: Corrie Mckusick D.O.   On: 02/04/2016 06:56   Dg Chest Portable 1 View  02/03/2016  CLINICAL DATA:  Respiratory failure. EXAM: PORTABLE CHEST 1 VIEW COMPARISON:  02/02/2016. FINDINGS: Endotracheal tube and NG tube in stable position. Prior CABG. Cardiomegaly. Pulmonary vascular prominence  with bilateral pulmonary infiltrates/ edema. Slight improvement from prior exam. IMPRESSION: 1. Lines and tubes stable position. 2. Prior CABG. Persistent cardiomegaly and bilateral pulmonary infiltrates/edema. Interim slight improvement from prior exam. Electronically Signed   By: [place]   On: 02/03/2016 07:05   Dg Chest Portable 1 View  02/02/2016  CLINICAL DATA:  Check endotracheal tube placement EXAM: PORTABLE CHEST 1 VIEW COMPARISON:  02/02/2016 FINDINGS: Endotracheal tube is been placed in lies approximately 2 cm above the carina. A nasogastric catheter is coiled within the stomach. Postsurgical changes are seen. Increasing perihilar densities are noted consistent with frank pulmonary edema. No sizable effusion is seen. No bony abnormality is noted. IMPRESSION: Tubes and lines in satisfactory position. Increasing pulmonary edema Electronically Signed   By: Inez Catalina M.D.   On: 02/02/2016 11:50    ASSESSMENT/PLAN:  65 y.o. female with a history of hx of CAD s/p CABG, HTN, diabetes, ischemic cardiomyopathy, combined systolic and diastolic CHF, Recent dx of breast cancer (plan to start chemo) moderate to severe mitral regurgitation, PAD status post prior renal artery stenting, HL, carotid stenosis status post R CEA, CKD, bipolar affective d/o who presented to  [facility] ED for evaluation of SOB and chest pain  1) Acute on chronic systolic heart failure: Diuresing well - net neg 3.6L.Creatinine increased today 1.55->1.6.Lungs are clear and she has no edema.Lasix changed to PO yesterday. ACE I and aldactone on hold due to AKI.Continue Coreg. Continue long acting nitrates.  BP stable so will add Hydralazine 10mg  TID.  2. CAD s/p remote CABG with cath 09/2015 with all vein grafts occluded and LIMA to LAD widely patent but distal LAD with diffuse disease Has a very tight distal LAD stenosis that I suspect is the culprit. It would have to be approached via the LIMA graft. She has diffuse  moderate - severe CAD involving the LM, LAD and LCx. She discussed options with Dr. Tamala Julian yesterday and has decided not to pursue PCI and will remain on medical management. She had more CP last night but currently pain free.  - continue ASA/Plavix/statin/long acting nitrates. - Ranexa will most likely be cost prohibitive. - patient had long discussion regarding high risk PCI with Dr. Tamala Julian and has decided against it. Will continue with medical management. One thing that has made medical management difficult is that she does not want generic meds but then cost becomes difficult.   3. Hypokalemia - repleted.   4. Hematuria - ? Etiology. Heparin stopped. Hbg down to 8.9. Resolving after stopping Heparin. Urology consult appreciated.   5. Hyperlipidemia - continue statin.   6. HTN - BP controlled   7. Ischemic DCM EF 20% by echo with NSVT on monitor. She has been on Coreg and Aldactone in the past. She has refused to take Imdur or hydralazine in the past because there is no brand name for these and she is convinced that generic drugs were responsible for her heart problems in the past.  Would change to BiDil at discharge since she refuses generic drugs.   8. Carotid artery stenosis with s/p R CEA and now with LICA [id number] - Dr. Donnetta Hutching following.   9. Nonsustained VT 23 beats. K and Mg repleted.   Continue Amio 400mg  BID load for suppression of VT. She has now become DNR. I have discussed AICD with her and she does not want to pursue this. Check TSH. ALT ok.  Will need PFTs with [facility]CO prior to discharge for baseline.  Daily EKG for QTc.   Sueanne Margarita, MD  02/09/2016  10:22 AM

## 2016-02-09 NOTE — Care Management Note (Signed)
Case Management Note  Patient Details  Name: Penny James MRN: [id number] Date of Birth: 02/03/1951  Subjective/Objective:   Pt lives with spouse, sons check on her frequently.   Pt has chosen to discharge home with hospice following.  Provided list of hospice agencies and referred to [facility] per choice.              Expected Discharge Date:  02/05/16               Expected Discharge Plan:  Home w Hospice Care  Discharge planning Services  CM Consult  Post Acute Care Choice:  Hospice Choice offered to:  Patient  HH Arranged:  RN, Social Work [facility] Agency:  Hospice and [place] of Service:  In process, will continue to follow  Medicare Important Message Given:  Yes  Girard Cooter, RN 02/09/2016, 12:37 PM

## 2016-02-09 NOTE — Progress Notes (Addendum)
Daily Progress Note   Patient Name: Penny James       Date: 02/09/2016 DOB: 1951/07/05  Age: 65 y.o. 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  Has had intermittent episodes of Vtach- K and Mg repleted and she was started on amiodarone- cardiology offered AICD placement but she declined. On 3L O2 nasal cannula. Diuresing well: net -3.6L so far.  Subjective: [name] is complaining of dry cough, but feels that her breathing is improved and she does not have any chest pain currently. Her biggest complaint is that she would like to have her Foley catheter removed so that she can use the bathroom on her own.  I informed her that she was hospice eligible, and we discussed what that meant. She is interested in having home hospice after discharge.  She reiterated "when it's my time, I will go".   She seems very much at peace with this decision.  I wanted to make sure the patient and family are on the same page with the plan of care, so I called her son Penny James and we have arranged to have a family meeting tomorrow at 8:00 am with her 2 sons (Penny James and Penny James) and Theme park manager. The patient agrees to this.   Length of Stay: 7 days  Current Medications: Scheduled Meds:  . amiodarone  400 mg Oral BID  . aspirin  81  mg Per Tube Daily  . carvedilol  6.25 mg Oral BID WC  . chlorpheniramine-HYDROcodone  5 mL Oral QHS  . clopidogrel  75 mg Per Tube Daily  . furosemide  80 mg Oral Daily  . hydrALAZINE  10 mg Oral 3 times per day  . insulin aspart  0-15 Units Subcutaneous TID WC  . insulin aspart  0-5 Units Subcutaneous QHS  . isosorbide mononitrate  90 mg Oral Daily  . pantoprazole  40 mg Oral Daily  . rosuvastatin  40 mg Oral q1800    Continuous Infusions: . sodium chloride 10 mL/hr at 02/08/16 1520    PRN Meds: albuterol, alum & mag hydroxide-simeth, guaiFENesin-dextromethorphan, hydrocortisone cream, lip balm, loratadine, morphine injection, nitroGLYCERIN, phenol, polyvinyl alcohol, sodium chloride, witch hazel-glycerin  Physical Exam     Constitutional- Awake, alert, answering questions appropriately Respiratory- CTAB Cardiovascular- RRR Abdomen- soft and nontender, nondistended Extremities- No edema, warm to touch           Vital Signs: BP 128/65 mmHg  Pulse 77  Temp(Src) 97.6 F (36.4 C) (Oral)  Resp 29  Ht 5\' 3"  (1.6 m)  Wt 64.5 kg (142 lb 3.2 oz)  BMI 25.20 kg/m2  SpO2 100% SpO2: SpO2: 100 % O2 Device: O2 Device: Nasal Cannula O2 Flow Rate: O2 Flow Rate (L/min): 2 L/min  Intake/output summary:  Intake/Output Summary (Last 24 hours) at 02/09/16 1123 Last data filed at 02/09/16 0900  Gross per 24 hour  Intake    720 ml  Output    850 ml  Net   -130 ml   LBM: Last BM Date:  (unsure of last bm) Baseline Weight: Weight: 67.132 kg (148 lb) Most recent weight: Weight: 64.5 kg (142 lb 3.2 oz)       Palliative Assessment/Data: Flowsheet Rows        Most Recent Value   Intake Tab    Referral Department  Critical care   Unit at Time of Referral  ICU   Palliative Care Primary Diagnosis  Cardiac   Date Notified  02/07/16   Palliative Care Type  New Palliative care   Reason for referral  Clarify Goals of Care   Date of Admission  02/02/16   Date first seen by Palliative Care   02/07/16   # of days Palliative referral response time  0 Day(s)   # of days IP prior to Palliative referral  5   Clinical Assessment    Palliative Performance Scale Score  40%   Psychosocial & Spiritual Assessment    Palliative Care Outcomes    Patient/Family meeting held?  Yes   Who was at the meeting?  Patient and PMT   Palliative Care Outcomes  Clarified goals of care, Changed CPR status   Patient/Family wishes: Interventions discontinued/not started   Mechanical Ventilation      Additional Data Reviewed: CBC    Component Value Date/Time   WBC 6.4 02/07/2016 0213   WBC 3.6* 01/11/2016 0911   RBC 3.39* 02/07/2016 0213   RBC 3.90 01/11/2016 0911   HGB 8.9* 02/07/2016 0213   HGB 10.8* 01/11/2016 0911   HCT 27.7* 02/07/2016 0213   HCT 31.7* 01/11/2016 0911   PLT 140* 02/07/2016 0213   PLT 146 01/11/2016 0911   MCV 81.7 02/07/2016 0213   MCV 81.3 01/11/2016 0911   MCH 26.3 02/07/2016 0213   MCH 27.7 01/11/2016 0911   MCHC 32.1 02/07/2016 0213   MCHC 34.1 01/11/2016 0911   RDW 16.1* 02/07/2016 0213   RDW 14.7* 01/11/2016 0911   LYMPHSABS 1.2 01/11/2016 0911   LYMPHSABS 1.2 12/29/2015 0607   MONOABS 0.3 01/11/2016 0911   MONOABS 0.4 12/29/2015 0607   EOSABS 0.1 01/11/2016 0911   EOSABS 0.1 12/29/2015 0607   BASOSABS 0.0 01/11/2016 0911   BASOSABS 0.0 12/29/2015 0607    CMP     Component Value Date/Time   NA 136 02/09/2016 0300   NA 134* 01/11/2016 0911   K 4.7  02/09/2016 0300   K 4.2 01/11/2016 0911   CL 98* 02/09/2016 0300   CO2 25 02/09/2016 0300   CO2 29 01/11/2016 0911   GLUCOSE 133* 02/09/2016 0300   GLUCOSE 155* 01/11/2016 0911   BUN 53* 02/09/2016 0300   BUN 20.8 01/11/2016 0911   CREATININE 1.68* 02/09/2016 0300   CREATININE 1.3* 01/11/2016 0911   CREATININE 1.16* 01/06/2016 1326   CALCIUM 9.7 02/09/2016 0300   CALCIUM 9.1 01/11/2016 0911   PROT 5.6* 02/07/2016 0213   PROT 6.6 01/11/2016 0911   ALBUMIN 2.8* 02/07/2016 0213   ALBUMIN 4.0  01/11/2016 0911   AST 106* 02/07/2016 0213   AST 25 01/11/2016 0911   ALT 25 02/07/2016 0213   ALT 25 01/11/2016 0911   ALKPHOS 41 02/07/2016 0213   ALKPHOS 58 01/11/2016 0911   BILITOT 1.0 02/07/2016 0213   BILITOT 0.54 01/11/2016 0911   GFRNONAA 30* 02/09/2016 0300   GFRAA 35* 02/09/2016 0300       Problem List:  Patient Active Problem List   Diagnosis Date Noted  . Acute respiratory distress (HCC)   . Palliative care encounter   . Goals of care, counseling/discussion   . DNR (do not resuscitate)   . Cough   . Chest pain 02/06/2016  . Elevated troponin 02/06/2016  . Endotracheally intubated   . Acute respiratory failure with hypoxia ([place])   . Acute on chronic systolic congestive heart failure ([place])   . Acute hypoxemic respiratory failure ([place])   . Acute respiratory failure ([place]) 02/02/2016  . Acute pulmonary edema (HCC)   . SOB (shortness of breath)   . Hyponatremia   . CKD (chronic kidney disease)   . Breast cancer of upper-outer quadrant of left female breast ([place]) 01/11/2016  . Acute on chronic combined systolic and diastolic heart failure ([place]) 12/29/2015  . Coronary artery disease involving coronary bypass graft   . Cardiomyopathy, ischemic-new 12/16   . NSTEMI (non-ST elevated myocardial infarction) ([place])   . Acute kidney injury superimposed on CKD ([place])   . Acute on chronic combined systolic and diastolic heart failure, NYHA class 3 ([place]) 10/03/2015  . Aortic atherosclerosis ([place]) 09/09/2015  . Pancytopenia ([place]) 09/09/2015  . GERD (gastroesophageal reflux disease) 08/03/2015  . Anemia 08/03/2015  . Carotid stenosis 09/03/2014  . Chronic diastolic CHF (congestive heart failure) ([place])   . DM (diabetes mellitus) type II controlled peripheral vascular disorder ([place]) 04/12/2014  . Hypokalemia 04/12/2014  . Coronary atherosclerosis of native coronary artery 02/02/2014  . Mixed hyperlipidemia 02/02/2014  . Benign essential HTN 02/02/2014  . Obesity 02/02/2014   . PVD (peripheral vascular disease) [facility])      Palliative Care Assessment & Plan    1.Code Status: DNR   2. Goals of Care/Additional Recommendations:  Limitations on Scope of Treatment:  Family Meeting at 8:00 on 4/14 scheduled  Desire for further Chaplaincy support:Yes- [facility]  Psycho-social Needs: Caregiving  Support/Resources and Education on Hospice  3. Symptom Management:      1. Cough- order for Tussionex at bedtime placed. Continue Robitussin.       2. Foley catheter- Talked with Dr. Wyline Copas who said that her catheter was still in place because she was urinating frequently and he didn't want her waking up many times at night; but he agrees to removing the Foley if it's what the patient wants. Placed order for Foley removal.       3. Constipation- She has not had a bowel movement since being in the  hospital and has not had a lot to eat. Order for stool softener placed.  4. Palliative Prophylaxis:   Frequent Pain Assessment  5. Prognosis: Given end stage mixed HF, severe CAD, left breast cancer and vtach patient is at high risk for an acute event and likely has less than 6 months.  6. Discharge Planning:  Home with Hospice   Care plan was discussed with patient, son Penny James, Case Manager and Dr. Wyline Copas  Thank you for allowing the Palliative Medicine Team to assist in the care of this patient.   Time In: 1015 Time Out: 11:00 Total Time 45 mins Prolonged Time Billed  no         Kara Dies, Student-PA Imogene Burn, [place] [facility] Pager: [phone]   02/09/2016, 11:23 AM  Please contact Palliative Medicine Team phone at [phone] for questions and concerns.

## 2016-02-09 NOTE — Telephone Encounter (Signed)
Would recommend that she have a hospice attending physician if possible. I do not feel comfortable providing end-of-life medication management.Thank you.

## 2016-02-09 NOTE — Progress Notes (Signed)
Responded to spiritual care consult to provide support . Patient indicated that she was feeling better today than before.  Patient is concerned about her son who is not handling his mother health issues well.  Son is afraid mother will die. Patient requested prayer for son and herself.  Prayed and provided emotional and spiritual support Patient expressed that she was glad for visit and now have some peace and better understanding of why her son is afraid.  Chaplain will follow as needed.   02/09/16 1500  Clinical Encounter Type  Visited With Patient;Health care provider  Visit Type Initial;Spiritual support  Referral From Nurse  Spiritual Encounters  Spiritual Needs Prayer;Emotional  Stress Factors  Patient Stress Factors Family relationships;Health changes  Jaclynn Major, [place]

## 2016-02-09 NOTE — Progress Notes (Signed)
02/09/2016 patient foley cath was removed at 1420. Patient had 500 cc of clear urine and odor noted. Patient tolerate removal of foley cath. [facility] RN.

## 2016-02-09 NOTE — Progress Notes (Signed)
02/09/2016 Dr Wyline Copas is aware patient not sure when last bowel movement. She is not complaining of constipation. [facility] RN.

## 2016-02-09 NOTE — Progress Notes (Signed)
[facility] PROGRESS NOTE  Penny [name] DOB: 09-16-1951 DOA: 02/02/2016 PCP: Lucretia Kern., DO  HPI/Brief narrative 65 y.o. 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 Admitted 04/06 with SOB secondary to CHF exacerbation. Concern for decompensated heart failure  Assessment/Plan: 1. Acutely decompensated combined systolic and diastolic CHF exacerbation 1. Patient is continued on scheduled lasix, changed to PO on 4/12 2. Thus far, net neg 3.67L 3. Wt today 64.5kg from 64.8kg yesterday 2. Vtach 1. See previous documentation 2. HR now much improved 3. Cardiology following and pt has expressed wishes against pacemaker 4. Lytes corrected. Cont amiodarone and increased dose of coreg as tolerated 3. GERD 1. Stable 4. Hypokalemia 1. Replaced 2. Continue to follow lytes and correct as needed 5. Hx breast cancer 1. Pt to follow up with Dr. Lindi Adie 6. DM2 1. Cont SSI coverage 7. Hx bipolar 8. DVT prophylaxis 1. SCD's 9. End of Life 1. Wishes noted to be DNR. Appreciate input by Palliative Care who is currently assisting with transition to hospice  Code Status: DNR Family Communication: Pt in room, family at bedside Disposition Plan: Uncertain at this time   Consultants:  Critical Care  Cardiology  Palliative Care  Procedures:    Antibiotics: Anti-infectives    None      HPI/Subjective: Patient states feeling better  Objective: Filed Vitals:   02/09/16 0337 02/09/16 0338 02/09/16 0726 02/09/16 1136  BP:  119/70 128/65 110/54  Pulse:  75 77 74  Temp:  97.3 F (36.3 C) 97.6 F (36.4 C) 97.4 F (36.3 C)  TempSrc:  Oral Oral Oral  Resp:  20 29 25   Height:      Weight: 64.5 kg (142 lb 3.2 oz)     SpO2:  100% 100% 100%    Intake/Output  Summary (Last 24 hours) at 02/09/16 1355 Last data filed at 02/09/16 0900  Gross per 24 hour  Intake    720 ml  Output    850 ml  Net   -130 ml   Filed Weights   02/07/16 0347 02/08/16 0439 02/09/16 0337  Weight: 63.5 kg (139 lb 15.9 oz) 64.8 kg (142 lb 13.7 oz) 64.5 kg (142 lb 3.2 oz)    Exam:   General:  Awake, in nad, sitting in chair  Cardiovascular: regular, s1, s2  Respiratory: normal resp effort, no wheezing  Abdomen: soft, nondistended, pos BS  Musculoskeletal: perfused, no clubbing   Data Reviewed: Basic Metabolic Panel:  Recent Labs Lab 02/03/16 0300 02/04/16 0233 02/05/16 1007 02/06/16 0350 02/07/16 0213 02/08/16 1004 02/09/16 0300  NA 130* 132* 137 138 141 138 136  K 3.6 3.6 3.5 3.4* 3.9 4.4 4.7  CL 95* 97* 99* 100* 102 98* 98*  CO2 21* 24 26 27 26 27 25   GLUCOSE 70 106* 133* 162* 109* 138* 133*  BUN 27* 34* 33* 38* 37* 46* 53*  CREATININE 1.63* 1.41* 1.31* 1.27* 1.34* 1.55* 1.68*  CALCIUM 9.3 8.6* 9.3 9.0 9.3 9.6 9.7  MG 2.0 2.2 2.2  --   --  2.3  --   PHOS 3.0 4.0 3.3  --   --   --   --    Liver Function Tests:  Recent Labs Lab 02/07/16 0213  AST 106*  ALT 25  ALKPHOS 41  BILITOT 1.0  PROT 5.6*  ALBUMIN 2.8*   No results for input(s): LIPASE, AMYLASE in the last 168 hours. No results for input(s): AMMONIA in the last 168 hours. CBC:  Recent Labs Lab 02/04/16 0233 02/05/16 0233 02/05/16 1746 02/06/16 0350 02/07/16 0213  WBC 5.2 9.3 6.5 7.5 6.4  HGB 9.9* 10.1* 9.8* 9.1* 8.9*  HCT 29.4* 30.3* 30.3* 27.5* 27.7*  MCV 79.2 80.4 80.8 80.6 81.7  PLT 112* 120* 121* 118* 140*   Cardiac Enzymes:  Recent Labs Lab 02/03/16  TROPONINI 1.61*   BNP (last 3 results)  Recent Labs  12/29/15 0607 12/30/15 0345 02/02/16 0700  BNP 1860.2* 1975.5* 1635.8*    ProBNP (last 3 results)  Recent Labs  04/01/15 0738  PROBNP 460.0*    CBG:  Recent Labs Lab 02/08/16 1144 02/08/16 1625 02/08/16 2147 02/09/16 0727 02/09/16 1134   GLUCAP 131* 149* 136* 131* 120*    Recent Results (from the past 240 hour(s))  MRSA PCR Screening     Status: None   Collection Time: 02/02/16  1:43 PM  Result Value Ref Range Status   MRSA by PCR NEGATIVE NEGATIVE Final    Comment:        The GeneXpert MRSA Assay (FDA approved for NASAL specimens only), is one component of a comprehensive MRSA colonization surveillance program. It is not intended to diagnose MRSA infection nor to guide or monitor treatment for MRSA infections.   Culture, blood (routine x 2)     Status: None   Collection Time: 02/03/16  9:31 AM  Result Value Ref Range Status   Specimen Description BLOOD BLOOD LEFT FOREARM  Final   Special Requests BOTTLES DRAWN AEROBIC AND ANAEROBIC 5CC  Final   Culture NO GROWTH 5 DAYS  Final   Report Status 02/08/2016 FINAL  Final  Culture, blood (routine x 2)     Status: None   Collection Time: 02/03/16  9:38 AM  Result Value Ref Range Status   Specimen Description BLOOD BLOOD RIGHT FOREARM  Final   Special Requests IN PEDIATRIC BOTTLE 4CC  Final   Culture NO GROWTH 5 DAYS  Final   Report Status 02/08/2016 FINAL  Final  Culture, Urine     Status: Abnormal   Collection Time: 02/06/16  4:52 PM  Result Value Ref Range Status   Specimen Description URINE, CATHETERIZED  Final   Special Requests NONE  Final   Culture >=100,000 COLONIES/mL ENTEROCOCCUS SPECIES (A)  Final   Report Status 02/09/2016 FINAL  Final   Organism ID, Bacteria ENTEROCOCCUS SPECIES (A)  Final      Susceptibility   Enterococcus species - MIC*    AMPICILLIN <=2 SENSITIVE Sensitive     LEVOFLOXACIN 1 SENSITIVE Sensitive     NITROFURANTOIN <=16 SENSITIVE Sensitive     VANCOMYCIN 2 SENSITIVE Sensitive     * >=100,000 COLONIES/mL ENTEROCOCCUS SPECIES     Studies: No results found.  Scheduled Meds: . amiodarone  400 mg Oral BID  . aspirin  81 mg Per Tube Daily  . carvedilol  6.25 mg Oral BID WC  . chlorpheniramine-HYDROcodone  5 mL Oral QHS  .  clopidogrel  75 mg Per Tube Daily  . furosemide  80 mg Oral Daily  . hydrALAZINE  10 mg Oral 3 times per day  . insulin aspart  0-15 Units Subcutaneous TID WC  . insulin aspart  0-5 Units Subcutaneous QHS  . isosorbide mononitrate  90 mg Oral Daily  . pantoprazole  40 mg Oral Daily  . rosuvastatin  40 mg Oral q1800   Continuous Infusions: . sodium chloride 10 mL/hr at 02/08/16 1520    Active Problems:   Mixed hyperlipidemia   Benign essential HTN   Cardiomyopathy, ischemic-new 12/16   Acute respiratory failure (HCC)   Acute respiratory failure with hypoxia (HCC)   Acute on chronic systolic congestive heart failure (HCC)   Acute hypoxemic respiratory failure (HCC)   Chest pain   Elevated troponin   Endotracheally intubated   Palliative care encounter   Goals of care, counseling/discussion   DNR (do not resuscitate)   Cough   Acute respiratory distress ([place])    Teana Lindahl, [place] [facility] Pager [phone]. If 7PM-7AM, please contact night-coverage at [email or web address], password [facility] 02/09/2016, 1:55 PM  LOS: 7 days

## 2016-02-09 NOTE — Care Management Important Message (Signed)
Important Message  Patient Details  Name: Penny James MRN: [id number] Date of Birth: [birth year]   Medicare Important Message Given:  Yes    Nathen May 02/09/2016, 10:49 AM

## 2016-02-09 NOTE — Telephone Encounter (Signed)
Vickie would like a call back before 5 pm today if possible.  Thank you. [phone]

## 2016-02-09 NOTE — Telephone Encounter (Signed)
Usually it is the PCP

## 2016-02-09 NOTE — Telephone Encounter (Signed)
New Message:   She wants to know if Dr Radford Pax would be the attending of records for her Hospice Care? Hospice offer symptom management if she would like it.

## 2016-02-10 ENCOUNTER — Ambulatory Visit (HOSPITAL_COMMUNITY): Payer: Medicare Other

## 2016-02-10 ENCOUNTER — Inpatient Hospital Stay (HOSPITAL_COMMUNITY): Payer: Medicare Other

## 2016-02-10 ENCOUNTER — Encounter (HOSPITAL_COMMUNITY): Payer: Self-pay | Admitting: Cardiology

## 2016-02-10 DIAGNOSIS — I472 Ventricular tachycardia, unspecified: Secondary | ICD-10-CM

## 2016-02-10 HISTORY — DX: Ventricular tachycardia: I47.2

## 2016-02-10 HISTORY — DX: Ventricular tachycardia, unspecified: I47.20

## 2016-02-10 LAB — PULMONARY FUNCTION TEST
DL/VA % pred: 95 %
DL/VA: 4.47 ml/min/mmHg/L
DLCO unc % pred: 50 %
DLCO unc: 11.64 ml/min/mmHg
FEF 25-75 PRE: 1.15 L/s
FEF2575-%PRED-PRE: 66 %
FEV1-%PRED-PRE: 60 %
FEV1-PRE: 1.1 L
FEV1FVC-%PRED-PRE: 114 %
FEV6-%Pred-Pre: 49 %
FEV6-PRE: 1.12 L
FEV6FVC-%Pred-Pre: 104 %
FVC-%Pred-Pre: 52 %
FVC-Pre: 1.23 L
PRE FEV1/FVC RATIO: 89 %
Pre FEV6/FVC Ratio: 100 %
RV % PRED: 108 %
RV: 2.26 L
TLC % PRED: 75 %
TLC: 3.69 L

## 2016-02-10 LAB — BASIC METABOLIC PANEL
ANION GAP: 12 (ref 5–15)
BUN: 59 mg/dL — AB (ref 6–20)
CALCIUM: 9.2 mg/dL (ref 8.9–10.3)
CO2: 27 mmol/L (ref 22–32)
CREATININE: 1.76 mg/dL — AB (ref 0.44–1.00)
Chloride: 96 mmol/L — ABNORMAL LOW (ref 101–111)
GFR calc Af Amer: 33 mL/min — ABNORMAL LOW (ref >60)
GFR, EST NON AFRICAN AMERICAN: 29 mL/min — AB (ref >60)
GLUCOSE: 119 mg/dL — AB (ref 65–99)
Potassium: 4 mmol/L (ref 3.5–5.1)
Sodium: 135 mmol/L (ref 135–145)

## 2016-02-10 LAB — GLUCOSE, CAPILLARY
Glucose-Capillary: 120 mg/dL — ABNORMAL HIGH (ref 65–99)
Glucose-Capillary: 105 mg/dL — ABNORMAL HIGH (ref 65–99)
Glucose-Capillary: 159 mg/dL — ABNORMAL HIGH (ref 65–99)
Glucose-Capillary: 119 mg/dL — ABNORMAL HIGH (ref 65–99)
Glucose-Capillary: 144 mg/dL — ABNORMAL HIGH (ref 65–99)

## 2016-02-10 MED ORDER — FUROSEMIDE 40 MG PO TABS
40.0000 mg | ORAL_TABLET | Freq: Every day | ORAL | Status: DC
  Administered 2016-02-11: 40 mg via ORAL
  Filled 2016-02-10: qty 1

## 2016-02-10 NOTE — Telephone Encounter (Signed)
Left message to call back  

## 2016-02-10 NOTE — Progress Notes (Signed)
[facility] PROGRESS NOTE  Penny [name] DOB: [birth year] DOA: 02/02/2016 PCP: Lucretia Kern., DO  HPI/Brief narrative 65 y.o. 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 Admitted 04/06 with SOB secondary to CHF exacerbation. Concern for decompensated heart failure  Assessment/Plan: 1. Acutely decompensated combined systolic and diastolic CHF exacerbation 1. Patient is continued on scheduled lasix, changed to PO on 4/12 2. Thus far, net neg 4.2L 3. Wt today 64.7kg from 64.8kg yesterday 2. Vtach 1. See previous documentation 2. HR now much improved 3. Cardiology following and pt has expressed wishes against pacemaker 4. Lytes corrected. Cont amiodarone and increased dose of coreg as tolerated 5. Per cardiology, cont amio 400mg  bid x 1 week then 400mg  daily. PFT's ordered 3. GERD 1. Stable 4. Hypokalemia 1. Replaced 2. Continue to follow lytes and correct as needed 5. Hx breast cancer 1. Pt to follow up with Dr. Lindi Adie 6. DM2 1. Cont SSI coverage 7. Hx bipolar 8. DVT prophylaxis 1. SCD's 9. End of Life 1. Wishes noted to be DNR. Appreciate input by Palliative Care who is currently assisting with transition to hospice, possible d/c to home w/ hospice over weekend  Code Status: DNR Family Communication: Pt in room, family at bedside Disposition Plan: Uncertain at this time   Consultants:  Critical Care  Cardiology  Palliative Care  Procedures:    Antibiotics: Anti-infectives    None      HPI/Subjective: No complaints today. Reports feeling at peace  Objective: Filed Vitals:   02/09/16 1940 02/10/16 0351 02/10/16 0746 02/10/16 1146  BP: 106/52 109/47 97/47 119/50  Pulse: 71 66 67 73  Temp: 98.6 F (37 C) 97.8 F (36.6 C) 97.8 F (36.6  C)   TempSrc: Oral Oral Oral   Resp: 16 18 16 16   Height:      Weight:  64.774 kg (142 lb 12.8 oz)    SpO2: 100% 100% 100% 100%    Intake/Output Summary (Last 24 hours) at 02/10/16 1524 Last data filed at 02/10/16 1141  Gross per 24 hour  Intake    536 ml  Output    625 ml  Net    -89 ml   Filed Weights   02/09/16 0337 02/09/16 1749 02/10/16 0351  Weight: 64.5 kg (142 lb 3.2 oz) 66.906 kg (147 lb 8 oz) 64.774 kg (142 lb 12.8 oz)    Exam:   General:  Awake, in nad, sitting in chair by window  Cardiovascular: regular, s1, s2  Respiratory: normal resp effort, no wheezing  Abdomen: soft, nondistended, pos BS  Musculoskeletal: perfused, no clubbing   Data Reviewed: Basic Metabolic Panel:  Recent Labs Lab 02/04/16 0233 02/05/16 1007 02/06/16 0350 02/07/16 0213 02/08/16 1004 02/09/16 0300 02/10/16 0439  NA 132* 137 138 141 138 136 135  K 3.6 3.5 3.4* 3.9 4.4 4.7 4.0  CL 97* 99* 100* 102 98* 98* 96*  CO2 24 26 27 26 27 25 27   GLUCOSE 106* 133* 162* 109* 138* 133* 119*  BUN 34* 33* 38* 37* 46* 53* 59*  CREATININE 1.41* 1.31* 1.27* 1.34* 1.55* 1.68* 1.76*  CALCIUM 8.6* 9.3 9.0 9.3 9.6 9.7 9.2  MG 2.2 2.2  --   --  2.3  --   --   PHOS 4.0 3.3  --   --   --   --   --  Liver Function Tests:  Recent Labs Lab 02/07/16 0213  AST 106*  ALT 25  ALKPHOS 41  BILITOT 1.0  PROT 5.6*  ALBUMIN 2.8*   No results for input(s): LIPASE, AMYLASE in the last 168 hours. No results for input(s): AMMONIA in the last 168 hours. CBC:  Recent Labs Lab 02/04/16 0233 02/05/16 0233 02/05/16 1746 02/06/16 0350 02/07/16 0213  WBC 5.2 9.3 6.5 7.5 6.4  HGB 9.9* 10.1* 9.8* 9.1* 8.9*  HCT 29.4* 30.3* 30.3* 27.5* 27.7*  MCV 79.2 80.4 80.8 80.6 81.7  PLT 112* 120* 121* 118* 140*   Cardiac Enzymes: No results for input(s): CKTOTAL, CKMB, CKMBINDEX, TROPONINI in the last 168 hours. BNP (last 3 results)  Recent Labs  12/29/15 0607 12/30/15 0345 02/02/16 0700  BNP  1860.2* 1975.5* 1635.8*    ProBNP (last 3 results)  Recent Labs  04/01/15 0738  PROBNP 460.0*    CBG:  Recent Labs Lab 02/09/16 1134 02/09/16 1558 02/09/16 2215 02/10/16 0726 02/10/16 1142  GLUCAP 120* 127* 120* 105* 159*    Recent Results (from the past 240 hour(s))  MRSA PCR Screening     Status: None   Collection Time: 02/02/16  1:43 PM  Result Value Ref Range Status   MRSA by PCR NEGATIVE NEGATIVE Final    Comment:        The GeneXpert MRSA Assay (FDA approved for NASAL specimens only), is one component of a comprehensive MRSA colonization surveillance program. It is not intended to diagnose MRSA infection nor to guide or monitor treatment for MRSA infections.   Culture, blood (routine x 2)     Status: None   Collection Time: 02/03/16  9:31 AM  Result Value Ref Range Status   Specimen Description BLOOD BLOOD LEFT FOREARM  Final   Special Requests BOTTLES DRAWN AEROBIC AND ANAEROBIC 5CC  Final   Culture NO GROWTH 5 DAYS  Final   Report Status 02/08/2016 FINAL  Final  Culture, blood (routine x 2)     Status: None   Collection Time: 02/03/16  9:38 AM  Result Value Ref Range Status   Specimen Description BLOOD BLOOD RIGHT FOREARM  Final   Special Requests IN PEDIATRIC BOTTLE 4CC  Final   Culture NO GROWTH 5 DAYS  Final   Report Status 02/08/2016 FINAL  Final  Culture, Urine     Status: Abnormal   Collection Time: 02/06/16  4:52 PM  Result Value Ref Range Status   Specimen Description URINE, CATHETERIZED  Final   Special Requests NONE  Final   Culture >=100,000 COLONIES/mL ENTEROCOCCUS SPECIES (A)  Final   Report Status 02/09/2016 FINAL  Final   Organism ID, Bacteria ENTEROCOCCUS SPECIES (A)  Final      Susceptibility   Enterococcus species - MIC*    AMPICILLIN <=2 SENSITIVE Sensitive     LEVOFLOXACIN 1 SENSITIVE Sensitive     NITROFURANTOIN <=16 SENSITIVE Sensitive     VANCOMYCIN 2 SENSITIVE Sensitive     * >=100,000 COLONIES/mL ENTEROCOCCUS SPECIES      Studies: No results found.  Scheduled Meds: . amiodarone  400 mg Oral BID  . aspirin  81 mg Per Tube Daily  . carvedilol  6.25 mg Oral BID WC  . chlorpheniramine-HYDROcodone  5 mL Oral QHS  . clopidogrel  75 mg Per Tube Daily  . docusate sodium  100 mg Oral BID  . [START ON 02/11/2016] furosemide  40 mg Oral Daily  . hydrALAZINE  10 mg Oral 3 times per day  .  insulin aspart  0-15 Units Subcutaneous TID WC  . insulin aspart  0-5 Units Subcutaneous QHS  . isosorbide mononitrate  90 mg Oral Daily  . pantoprazole  40 mg Oral Daily  . rosuvastatin  40 mg Oral q1800   Continuous Infusions: . sodium chloride 10 mL/hr at 02/08/16 1520    Active Problems:   Mixed hyperlipidemia   Benign essential HTN   Cardiomyopathy, ischemic-new 12/16   Acute respiratory failure (HCC)   Acute respiratory failure with hypoxia (HCC)   Acute on chronic systolic congestive heart failure (HCC)   Acute hypoxemic respiratory failure (HCC)   Chest pain   Elevated troponin   Endotracheally intubated   Palliative care encounter   Goals of care, counseling/discussion   DNR (do not resuscitate)   Cough   Acute respiratory distress (HCC)   CHF (congestive heart failure) (HCC)   Slow transit constipation   Encounter for hospice care   Ventricular tachycardia ([place])    [facility], [facility] Pager [phone]. If 7PM-7AM, please contact night-coverage at [email or web address], password [facility] 02/10/2016, 3:24 PM  LOS: 8 days

## 2016-02-10 NOTE — Progress Notes (Signed)
Physical Therapy Treatment Patient Details Name: Penny James MRN: [id number] DOB: Apr 02, 1951 Today's Date: 02/10/2016    History of Present Illness 65 yo female with hx CAD s/p CABG 2010, Chronic combined CHF (Echo from Dec 2016 with EF 20-25% with grade 2 DD), CKD III, DM, recent dx breast cancer (invasive ductal carcinoma with DCIS, recommended chemo but has not had this to date) presented 4/6 with one day hx chest pain, dyspnea, diaphoresis, orthopnea, cough with clear sputum. In ER she had marked respiratory distress. She was treated with asa, nitro gtt, lasix IV. First troponin wnl. BNP 1635. She continued to worsen despite attempts at bipap and was intubated.    PT Comments    Patient seen for mobility progression and O2 assessment. Patient ambulating on 2 liters with O2 saturations 96%, on room air remained >93% but patient with some noted DOE. Educated on pursed lip breathing and energy conservation. Overall progressing well towards PT goals.   Follow Up Recommendations  Home health PT;Supervision for mobility/OOB     Equipment Recommendations  Rolling walker with 5" wheels    Recommendations for Other Services OT consult     Precautions / Restrictions Precautions Precautions: Fall Restrictions Weight Bearing Restrictions: No    Mobility  Bed Mobility               General bed mobility comments: sitting EOB  Transfers Overall transfer level: Needs assistance Equipment used: None Transfers: Sit to/from Stand Sit to Stand: Supervision         General transfer comment: No physical assist required this session  Ambulation/Gait Ambulation/Gait assistance: Min guard Ambulation Distance (Feet): 210 Feet Assistive device: None Gait Pattern/deviations: Step-through pattern;Decreased stride length Gait velocity: decreased Gait velocity interpretation: Below normal speed for age/gender General Gait Details: Modest instability noted, improved with  increased cadence. Ambulated on 2 liters with saturations 96%, decreased to room air and saturations remained >93%, although patient did report increased DOE and fatigue.   Stairs            Wheelchair Mobility    Modified Rankin (Stroke Patients Only)       Balance     Sitting balance-Leahy Scale: Good     Standing balance support: During functional activity Standing balance-Leahy Scale: Fair                 High Level Balance Comments: Pt able to perform above with min guard assist.     Cognition Arousal/Alertness: Awake/alert Behavior During Therapy: WFL for tasks assessed/performed Overall Cognitive Status: Within Functional Limits for tasks assessed                      Exercises      General Comments General comments (skin integrity, edema, etc.): Educated on pursed lib breathing to control DOE, discussed energy conservation strategies.      Pertinent Vitals/Pain Pain Assessment: No/denies pain    Home Living                      Prior Function            PT Goals (current goals can now be found in the care plan section) Acute Rehab PT Goals Patient Stated Goal: To go home PT Goal Formulation: With patient Time For Goal Achievement: 02/21/16 Potential to Achieve Goals: Good Progress towards PT goals: Progressing toward goals    Frequency  Min 3X/week    PT Plan Current plan remains  appropriate    Co-evaluation             End of Session Equipment Utilized During Treatment: Gait belt Activity Tolerance: Patient tolerated treatment well Patient left: in chair;with call bell/phone within reach     Time: 1602-1621 PT Time Calculation (min) (ACUTE ONLY): 19 min  Charges:  $Gait Training: 8-22 mins                    G CodesDuncan Dull 2016-03-04, 6:15 PM Alben Deeds, [place] DPT  [phone]

## 2016-02-10 NOTE — Progress Notes (Signed)
Daily Progress Note   Patient Name: Penny James       Date: 02/10/2016 DOB: 1951-07-03  Age: 65 y.o. MRN#: [id number] Attending Physician: Donne Hazel, MD Primary Care Physician: Lucretia Kern., DO Admit Date: 02/02/2016  Reason for Consultation/Follow-up: Establishing goals of care / Extended Family Meeting   Subjective: Feeling much better.  No complaints.  Does not want invasive procedures, AICD placement or surgery.  Does want medications / pills that will help her remain stable and have a good quality of life.  Interval Events:  This morning Penny James and I met with her husband, son Letitia Libra), dtr in law ([place]), Ulen and neice (Randall Hiss and Isola who have their own Sharon here in [place]).  Her son Tamela Oddi was added on via conference call.  The family asked me to describe Penny James medical situation.  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  The family asked what would happen if an AICD was placed and I explained that it would reboot the heart (with a shock) if a severe arrhythmia developed, but that it would not help fix the heart muscle or the clogged arteries.  I stated that unfortunately due to these things Penny James time is limited.  Johnny asked how much time his mother had left.  I replied that we don't know.  It is truly in God's hands.  Penny James is at risk for an acute event that would take her quickly, however medical therapy she likely has good months left.   Eric asked how to improve the strength of the heart.  I described lessening the stressors on the heart by taking prescribed medications, eating the appropriate diet, and gentle exercise as  tolerated.  All of these things Penny James does.  She follows regularly with Dr. Tamala Julian of cardiology and has immense respect for him.     We also discussed her breast cancer.  Penny James states she has had that lump for 13 years.  She does not want surgery.  She will follow up with Dr. Lindi Adie regarding appropriate palliative therapy - if any at all as she feels the lump really does not bother her.  Penny James has been crystal clear about her wishes  since the first time I met her.  She wants: 1.  To preserve her good quality of life for as long as possible. 2.  To avoid invasive procedures / surgeries 3.  To pass with comfort and dignity when God calls her. 4.  She would like to return to her home with Hospice Services and avoid return to the hospital.   5.  She wants to enjoy her time with her supportive family.  Towards the end of the meeting Mr. Storr mentioned that he sees Dr. Haroldine Laws for heart failure and asked if it would be ok to get his opinion regarding medications.  I replied that Drs Tamala Julian and [place] work very closely together and that we could almost certainly accommodate his request.    This family was a very supportive, cohesive group.  Penny James sons and husband are (understandably)   Length of Stay: 8 days  Current Medications: Scheduled Meds:  . amiodarone  400 mg Oral BID  . aspirin  81 mg Per Tube Daily  . carvedilol  6.25 mg Oral BID WC  . chlorpheniramine-HYDROcodone  5 mL Oral QHS  . clopidogrel  75 mg Per Tube Daily  . docusate sodium  100 mg Oral BID  . furosemide  80 mg Oral Daily  . hydrALAZINE  10 mg Oral 3 times per day  . insulin aspart  0-15 Units Subcutaneous TID WC  . insulin aspart  0-5 Units Subcutaneous QHS  . isosorbide mononitrate  90 mg Oral Daily  . pantoprazole  40 mg Oral Daily  . rosuvastatin  40 mg Oral q1800    Continuous Infusions: . sodium chloride 10 mL/hr at 02/08/16 1520    PRN Meds: albuterol, alum & mag  hydroxide-simeth, guaiFENesin-dextromethorphan, hydrocortisone cream, lip balm, loratadine, morphine injection, nitroGLYCERIN, phenol, polyethylene glycol, polyvinyl alcohol, sodium chloride, witch hazel-glycerin   Physical Exam        Wd, extremely pleasant female, sitting up in the chair with nasal canula in place.  Appears comfortable. Resp:  No increased work of breathing. Psych:  A&O x 3, pleasant, cooperative, well groomed., appropriate.       Vital Signs: BP 97/47 mmHg  Pulse 67  Temp(Src) 97.8 F (36.6 C) (Oral)  Resp 16  Ht 5' 3"  (1.6 m)  Wt 64.774 kg (142 lb 12.8 oz)  BMI 25.30 kg/m2  SpO2 100% SpO2: SpO2: 100 % O2 Device: O2 Device: Nasal Cannula O2 Flow Rate: O2 Flow Rate (L/min): 2 L/min  Intake/output summary:  Intake/Output Summary (Last 24 hours) at 02/10/16 2951 Last data filed at 02/10/16 0356  Gross per 24 hour  Intake    776 ml  Output    925 ml  Net   -149 ml   LBM: Last BM Date:  (not sure when last bm, but stated she is not constipated) Baseline Weight: Weight: 67.132 kg (148 lb) Most recent weight: Weight: 64.774 kg (142 lb 12.8 oz)       Palliative Assessment/Data: Flowsheet Rows        Most Recent Value   Intake Tab    Referral Department  Critical care   Unit at Time of Referral  ICU   Palliative Care Primary Diagnosis  Cardiac   Date Notified  02/07/16   Palliative Care Type  New Palliative care   Reason for referral  Clarify Goals of Care   Date of Admission  02/02/16   Date first seen by Palliative Care  02/07/16   # of days  Palliative referral response time  0 Day(s)   # of days IP prior to Palliative referral  5   Clinical Assessment    Palliative Performance Scale Score  40%   Psychosocial & Spiritual Assessment    Palliative Care Outcomes    Patient/Family meeting held?  Yes   Who was at the meeting?  Patient and PMT   Palliative Care Outcomes  Clarified goals of care, Changed CPR status   Patient/Family wishes: Interventions  discontinued/not started   Mechanical Ventilation      Additional Data Reviewed: CBC    Component Value Date/Time   WBC 6.4 02/07/2016 0213   WBC 3.6* 01/11/2016 0911   RBC 3.39* 02/07/2016 0213   RBC 3.90 01/11/2016 0911   HGB 8.9* 02/07/2016 0213   HGB 10.8* 01/11/2016 0911   HCT 27.7* 02/07/2016 0213   HCT 31.7* 01/11/2016 0911   PLT 140* 02/07/2016 0213   PLT 146 01/11/2016 0911   MCV 81.7 02/07/2016 0213   MCV 81.3 01/11/2016 0911   MCH 26.3 02/07/2016 0213   MCH 27.7 01/11/2016 0911   MCHC 32.1 02/07/2016 0213   MCHC 34.1 01/11/2016 0911   RDW 16.1* 02/07/2016 0213   RDW 14.7* 01/11/2016 0911   LYMPHSABS 1.2 01/11/2016 0911   LYMPHSABS 1.2 12/29/2015 0607   MONOABS 0.3 01/11/2016 0911   MONOABS 0.4 12/29/2015 0607   EOSABS 0.1 01/11/2016 0911   EOSABS 0.1 12/29/2015 0607   BASOSABS 0.0 01/11/2016 0911   BASOSABS 0.0 12/29/2015 0607    CMP     Component Value Date/Time   NA 135 02/10/2016 0439   NA 134* 01/11/2016 0911   K 4.0 02/10/2016 0439   K 4.2 01/11/2016 0911   CL 96* 02/10/2016 0439   CO2 27 02/10/2016 0439   CO2 29 01/11/2016 0911   GLUCOSE 119* 02/10/2016 0439   GLUCOSE 155* 01/11/2016 0911   BUN 59* 02/10/2016 0439   BUN 20.8 01/11/2016 0911   CREATININE 1.76* 02/10/2016 0439   CREATININE 1.3* 01/11/2016 0911   CREATININE 1.16* 01/06/2016 1326   CALCIUM 9.2 02/10/2016 0439   CALCIUM 9.1 01/11/2016 0911   PROT 5.6* 02/07/2016 0213   PROT 6.6 01/11/2016 0911   ALBUMIN 2.8* 02/07/2016 0213   ALBUMIN 4.0 01/11/2016 0911   AST 106* 02/07/2016 0213   AST 25 01/11/2016 0911   ALT 25 02/07/2016 0213   ALT 25 01/11/2016 0911   ALKPHOS 41 02/07/2016 0213   ALKPHOS 58 01/11/2016 0911   BILITOT 1.0 02/07/2016 0213   BILITOT 0.54 01/11/2016 0911   GFRNONAA 29* 02/10/2016 0439   GFRAA 33* 02/10/2016 0439       Problem List:  Patient Active Problem List   Diagnosis Date Noted  . CHF (congestive heart failure) ([place])   . Slow transit  constipation   . Encounter for hospice care   . Acute respiratory distress (HCC)   . Palliative care encounter   . Goals of care, counseling/discussion   . DNR (do not resuscitate)   . Cough   . Chest pain 02/06/2016  . Elevated troponin 02/06/2016  . Endotracheally intubated   . Acute respiratory failure with hypoxia ([place])   . Acute on chronic systolic congestive heart failure ([place])   . Acute hypoxemic respiratory failure ([place])   . Acute respiratory failure ([place]) 02/02/2016  . Acute pulmonary edema (HCC)   . SOB (shortness of breath)   . Hyponatremia   . CKD (chronic kidney disease)   . Breast cancer  of upper-outer quadrant of left female breast ([place]) 01/11/2016  . Acute on chronic combined systolic and diastolic heart failure ([place]) 12/29/2015  . Coronary artery disease involving coronary bypass graft   . Cardiomyopathy, ischemic-new 12/16   . NSTEMI (non-ST elevated myocardial infarction) ([place])   . Acute kidney injury superimposed on CKD ([place])   . Acute on chronic combined systolic and diastolic heart failure, NYHA class 3 ([place]) 10/03/2015  . Aortic atherosclerosis ([place]) 09/09/2015  . Pancytopenia ([place]) 09/09/2015  . GERD (gastroesophageal reflux disease) 08/03/2015  . Anemia 08/03/2015  . Carotid stenosis 09/03/2014  . Chronic diastolic CHF (congestive heart failure) ([place])   . DM (diabetes mellitus) type II controlled peripheral vascular disorder ([place]) 04/12/2014  . Hypokalemia 04/12/2014  . Coronary atherosclerosis of native coronary artery 02/02/2014  . Mixed hyperlipidemia 02/02/2014  . Benign essential HTN 02/02/2014  . Obesity 02/02/2014  . PVD (peripheral vascular disease) [facility])      Palliative Care Assessment & Plan  GOALS:    1.  To preserve her good quality of life for as long as possible. 2.  To avoid invasive procedures / surgeries 3.  To pass with comfort and dignity when God calls her. 4.  She would like to return to her home with Hospice Services and  avoid return to the hospital.   5.  She wants to enjoy her time with her supportive family. 6.  Family requested an appointment with Dr. Haroldine Laws.  Code Status:  DNR   6. Discharge Planning:  Home with Hospice   Care plan was discussed with Family.  Case Management.  Thank you for allowing the Palliative Medicine Team to assist in the care of this patient.   Time In: 8:00 Time Out: 9:26 Total Time 86 mon Prolonged Time Billed no        Imogene Burn, [place] [facility] Pager: [phone]  02/10/2016, 8:52 AM  Please contact Palliative Medicine Team phone at [phone] for questions and concerns.

## 2016-02-10 NOTE — Progress Notes (Signed)
[facility] Liaison Note:  Spoke with patient at bedside to initiate education related to hospice philosophy, services and team approach to care. Patient verbalized understanding of the information provided. Patient does want to pursue home with hospice at discharge. Per discussion, plan is for discharge to home by personal vehicle Saturday or Sunday.   Please send signed completed DNR form home with patient.  Patient will need prescriptions for discharge comfort medications.   DME needs discussed and patient requested oxygen set-up for 2L continuous O2. Oxygen ordered through [facility] and they will contact Elaina Pattee to arrange time for delivery.  Portable tank will need to be delivered to room by in-house [facility] representative for ride home.  [facility] aware of the above.  Completed discharge summary will need to be faxed to [facility] at [phone] when final.  Please notify [facility] when patient is ready to leave unit at discharge-call [phone].   [facility] information and contact numbers have been given to patient during visit.   Please call with any questions.  Thank You,  Freddi Starr RN, [facility] Liaison  [phone]

## 2016-02-10 NOTE — Progress Notes (Signed)
SUBJECTIVE:  No complaints  OBJECTIVE:   Vitals:   Filed Vitals:   02/09/16 1749 02/09/16 1940 02/10/16 0351 02/10/16 0746  BP: 115/51 106/52 109/47 97/47  Pulse: 75 71 66 67  Temp: 97.7 F (36.5 C) 98.6 F (37 C) 97.8 F (36.6 C) 97.8 F (36.6 C)  TempSrc: Oral Oral Oral Oral  Resp: 18 16 18 16   Height: 5\' 3"  (1.6 m)     Weight: 147 lb 8 oz (66.906 kg)  142 lb 12.8 oz (64.774 kg)   SpO2: 100% 100% 100% 100%   I&O's:   Intake/Output Summary (Last 24 hours) at 02/10/16 1043 Last data filed at 02/10/16 0356  Gross per 24 hour  Intake    536 ml  Output    925 ml  Net   -389 ml   TELEMETRY: Reviewed telemetry pt in NSR:     PHYSICAL EXAM General: Well developed, well nourished, in no acute distress Head: Eyes PERRLA, No xanthomas.   Normal cephalic and atramatic  Lungs:   Clear bilaterally to auscultation and percussion. Heart:   HRRR S1 S2 Pulses are 2+ & equal. Abdomen: Bowel sounds are positive, abdomen soft and non-tender without masses Extremities:   No clubbing, cyanosis or edema.  DP +1 Neuro: Alert and oriented X 3. Psych:  Good affect, responds appropriately   LABS: Basic Metabolic Panel:  Recent Labs  02/08/16 1004 02/09/16 0300 02/10/16 0439  NA 138 136 135  K 4.4 4.7 4.0  CL 98* 98* 96*  CO2 27 25 27   GLUCOSE 138* 133* 119*  BUN 46* 53* 59*  CREATININE 1.55* 1.68* 1.76*  CALCIUM 9.6 9.7 9.2  MG 2.3  --   --    Liver Function Tests: No results for input(s): AST, ALT, ALKPHOS, BILITOT, PROT, ALBUMIN in the last 72 hours. No results for input(s): LIPASE, AMYLASE in the last 72 hours. CBC: No results for input(s): WBC, NEUTROABS, HGB, HCT, MCV, PLT in the last 72 hours. Cardiac Enzymes: No results for input(s): CKTOTAL, CKMB, CKMBINDEX, TROPONINI in the last 72 hours. BNP: Invalid input(s): POCBNP D-Dimer: No results for input(s): DDIMER in the last 72 hours. Hemoglobin A1C: No results for input(s): HGBA1C in the last 72 hours. Fasting  Lipid Panel: No results for input(s): CHOL, HDL, LDLCALC, TRIG, CHOLHDL, LDLDIRECT in the last 72 hours. Thyroid Function Tests:  Recent Labs  02/09/16 1045  TSH 2.491   Anemia Panel: No results for input(s): VITAMINB12, FOLATE, FERRITIN, TIBC, IRON, RETICCTPCT in the last 72 hours. Coag Panel:   Lab Results  Component Value Date   INR 1.27 02/07/2016   INR 1.19 02/02/2016   INR 1.15 12/29/2015    RADIOLOGY: Dg Chest 2 View  02/02/2016  CLINICAL DATA:  Centralized chest pain radiating to LEFT arm since yesterday, shortness of breath, history coronary artery disease post MI and CABG, diabetes mellitus, hypertension EXAM: CHEST  2 VIEW COMPARISON:  12/29/2015 FINDINGS: Enlargement of cardiac silhouette post CABG. Pulmonary vascular congestion. Atherosclerotic calcification aorta. Improved pulmonary edema. No pleural effusion or pneumothorax. Bones demineralized. IMPRESSION: Mild pulmonary edema, improved since previous exam. Electronically Signed   By: Lavonia Dana M.D.   On: 02/02/2016 07:28   Dg Chest Port 1 View  02/07/2016  CLINICAL DATA:  Dyspnea EXAM: PORTABLE CHEST 1 VIEW COMPARISON:  02/06/2016 FINDINGS: There is moderate cardiomegaly, unchanged. The lungs are clear except for minor linear basilar opacities. There is no large effusion. There is no pneumothorax. Endotracheal tube and nasogastric  tube have been removed. IMPRESSION: Removal of ET and NG tubes. The lungs are grossly clear. No pneumothorax. Electronically Signed   By: Andreas Newport M.D.   On: 02/07/2016 05:54   Dg Chest Port 1 View  02/06/2016  CLINICAL DATA:  Acute respiratory failure, intubated patient. EXAM: PORTABLE CHEST 1 VIEW COMPARISON:  Portable chest x-ray of February 04, 2016 FINDINGS: The lungs are adequately inflated. Patchy interstitial density projects medially in the right lower lobe with a small amount in the medial aspect of the right middle lobe. The left lung is clear. There is no pleural effusion or  pneumothorax. The cardiac silhouette remains enlarged. The pulmonary vascularity is not significantly engorged. The endotracheal tube tip lies 4.8 cm above the carina. The esophagogastric tube tip projects below the inferior margin of the image. IMPRESSION: Persistent infiltrate or atelectasis medially in the right middle and lower lobes. Stable cardiomegaly without pulmonary edema. The support tubes are in reasonable position. Electronically Signed   By: David  Martinique M.D.   On: 02/06/2016 07:11   Dg Chest Port 1 View  02/04/2016  CLINICAL DATA:  65 year old female with a history of intubation. Head min 02/02/2016 with respiratory failure. EXAM: PORTABLE CHEST 1 VIEW COMPARISON:  02/03/2016, 02/02/2016 FINDINGS: Cardiomediastinal silhouette unchanged cardiomegaly. Surgical changes of prior median sternotomy and CABG. Calcifications of the aortic arch. Surgical staples project over the left heart border. Unchanged position of the endotracheal tube, which terminates suitably above the carina, measuring 3.3 cm. Overlying EKG leads. Gastric tube unchanged, terminating out of the field of view. Compared to the study of 02/02/2016 there has been improved airspace opacity bilateral lungs. Persisting mixed interstitial airspace opacity at the right medial base partially obscuring the right heart border. No pneumothorax.  No large pleural effusion. IMPRESSION: Endotracheal tube terminates suitably above the carina. Unchanged position of gastric tube. Improved airspace disease compared to the chest x-ray of 02/02/2016. Cardiomegaly Signed, Dulcy Fanny. Earleen Newport, DO [facility] Electronically Signed   By: Corrie Mckusick D.O.   On: 02/04/2016 06:56   Dg Chest Portable 1 View  02/03/2016  CLINICAL DATA:  Respiratory failure. EXAM: PORTABLE CHEST 1 VIEW COMPARISON:  02/02/2016. FINDINGS: Endotracheal tube and NG tube in stable position. Prior CABG. Cardiomegaly. Pulmonary  vascular prominence with bilateral pulmonary infiltrates/ edema. Slight improvement from prior exam. IMPRESSION: 1. Lines and tubes stable position. 2. Prior CABG. Persistent cardiomegaly and bilateral pulmonary infiltrates/edema. Interim slight improvement from prior exam. Electronically Signed   By: [place]   On: 02/03/2016 07:05   Dg Chest Portable 1 View  02/02/2016  CLINICAL DATA:  Check endotracheal tube placement EXAM: PORTABLE CHEST 1 VIEW COMPARISON:  02/02/2016 FINDINGS: Endotracheal tube is been placed in lies approximately 2 cm above the carina. A nasogastric catheter is coiled within the stomach. Postsurgical changes are seen. Increasing perihilar densities are noted consistent with frank pulmonary edema. No sizable effusion is seen. No bony abnormality is noted. IMPRESSION: Tubes and lines in satisfactory position. Increasing pulmonary edema Electronically Signed   By: Inez Catalina M.D.   On: 02/02/2016 11:50    ASSESSMENT/PLAN:  65 y.o. female with a history of hx of CAD s/p CABG, HTN, diabetes, ischemic cardiomyopathy, combined systolic and diastolic CHF, Recent dx of breast cancer (plan to start chemo) moderate to severe mitral regurgitation, PAD status post prior renal artery stenting, HL, carotid stenosis status post R CEA, CKD, bipolar affective d/o who presented to [facility] ED for evaluation of  SOB and chest pain  1) Acute on chronic systolic heart failure: Diuresing well - net neg 4L.Creatinine increasing1.55->1.6->1.76.Lungs are clear and she has no edema.Continue PO Lasix but decrease to 40mg  daily since creatinine has bumped. ACE I and aldactone on hold due to AKI.Continue Coreg. Continue long acting nitrates/Hydralazine.  2. CAD s/p remote CABG with cath 09/2015 with all vein grafts occluded and LIMA to LAD widely patent but distal LAD with diffuse disease Has a very tight distal LAD stenosis that I suspect is the culprit. It would have to be approached via the  LIMA graft. She has diffuse moderate - severe CAD involving the LM, LAD and LCx. She discussed options with Dr. Tamala Julian yesterday and has decided not to pursue PCI and will remain on medical management. She had more CP last night but currently pain free.  - continue ASA/Plavix/statin/long acting nitrates. - Ranexa will most likely be cost prohibitive. - patient had long discussion regarding high risk PCI with Dr. Tamala Julian and has decided against it. Will continue with medical management. One thing that has made medical management difficult is that she does not want generic meds but then cost becomes difficult.   3. Hypokalemia - repleted.   4. Hematuria - ? Etiology. Heparin stopped. Hbg down to 8.9. Resolving after stopping Heparin. Urology consult appreciated.   5. Hyperlipidemia - continue statin.   6. HTN - BP controlled   7. Ischemic DCM EF 20% by echo with NSVT on monitor. She has been on Coreg and Aldactone in the past. She has refused to take Imdur or hydralazine in the past because there is no brand name for these and she is convinced that generic drugs were responsible for her heart problems in the past. Would change to BiDil at discharge since she refuses generic drugs.   8. Carotid artery stenosis with s/p R CEA and now with LICA [id number] - Dr. Donnetta Hutching following.   9. Nonsustained VT 23 beats. No reoccurrence.  K and Mg repleted. Continue Amio 400mg  BID load for suppression of VT. She has now become DNR. I have discussed AICD with her and she does not want to pursue this. TSH and ALT ok. Will need PFTs with DLCO prior to discharge for baseline. QTc stable.   Continue Amio 400mg  BID for 2 weeks then 400mg  daily    Sueanne Margarita, MD  02/10/2016  10:43 AM

## 2016-02-11 ENCOUNTER — Telehealth: Payer: Self-pay | Admitting: Family Medicine

## 2016-02-11 LAB — BASIC METABOLIC PANEL
ANION GAP: 11 (ref 5–15)
BUN: 59 mg/dL — AB (ref 6–20)
CHLORIDE: 95 mmol/L — AB (ref 101–111)
CO2: 27 mmol/L (ref 22–32)
CREATININE: 1.73 mg/dL — AB (ref 0.44–1.00)
Calcium: 9.2 mg/dL (ref 8.9–10.3)
GFR calc non Af Amer: 29 mL/min — ABNORMAL LOW (ref >60)
GFR, EST AFRICAN AMERICAN: 34 mL/min — AB (ref >60)
GLUCOSE: 111 mg/dL — AB (ref 65–99)
Potassium: 4.1 mmol/L (ref 3.5–5.1)
Sodium: 133 mmol/L — ABNORMAL LOW (ref 135–145)

## 2016-02-11 LAB — GLUCOSE, CAPILLARY
GLUCOSE-CAPILLARY: 105 mg/dL — AB (ref 65–99)
Glucose-Capillary: 139 mg/dL — ABNORMAL HIGH (ref 65–99)

## 2016-02-11 MED ORDER — ISOSORBIDE MONONITRATE ER 30 MG PO TB24: 90.0000 mg | ORAL_TABLET | Freq: Every day | ORAL | Status: AC

## 2016-02-11 MED ORDER — AMIODARONE HCL 400 MG PO TABS: 400.0000 mg | ORAL_TABLET | Freq: Every day | ORAL | Status: DC

## 2016-02-11 MED ORDER — DOCUSATE SODIUM 100 MG PO CAPS: 100.0000 mg | ORAL_CAPSULE | Freq: Two times a day (BID) | ORAL | Status: DC

## 2016-02-11 MED ORDER — POLYETHYLENE GLYCOL 3350 17 G PO PACK: 17.0000 g | PACK | Freq: Every day | ORAL | Status: DC

## 2016-02-11 MED ORDER — FUROSEMIDE 40 MG PO TABS: 40.0000 mg | ORAL_TABLET | Freq: Every day | ORAL | Status: DC

## 2016-02-11 MED ORDER — HYDROCOD POLST-CPM POLST ER 10-8 MG/5ML PO SUER: 5.0000 mL | Freq: Every day | ORAL | Status: DC

## 2016-02-11 MED ORDER — AMIODARONE HCL 400 MG PO TABS: 400.0000 mg | ORAL_TABLET | Freq: Two times a day (BID) | ORAL | Status: DC

## 2016-02-11 MED ORDER — BISACODYL 10 MG RE SUPP
10.0000 mg | Freq: Once | RECTAL | Status: AC
  Administered 2016-02-11: 10 mg via RECTAL
  Filled 2016-02-11: qty 1

## 2016-02-11 MED ORDER — CARVEDILOL 6.25 MG PO TABS
6.2500 mg | ORAL_TABLET | Freq: Two times a day (BID) | ORAL | Status: DC
Start: 2016-02-11 — End: 2016-03-11

## 2016-02-11 MED ORDER — POLYETHYLENE GLYCOL 3350 17 G PO PACK
17.0000 g | PACK | Freq: Every day | ORAL | Status: DC
  Filled 2016-02-11: qty 1

## 2016-02-11 MED ORDER — HYDRALAZINE HCL 10 MG PO TABS: 10.0000 mg | ORAL_TABLET | Freq: Three times a day (TID) | ORAL | Status: DC

## 2016-02-11 MED ORDER — ROSUVASTATIN CALCIUM 40 MG PO TABS
40.0000 mg | ORAL_TABLET | Freq: Every day | ORAL | Status: DC
Start: 2016-02-11 — End: 2016-02-11

## 2016-02-11 NOTE — Discharge Summary (Signed)
Physician Discharge Summary  Penny [name] DOB: 01/10/1951 DOA: 02/02/2016  PCP: Colin Benton R., DO  Admit date: 02/02/2016 Discharge date: 02/11/2016  Time spent: 20 minutes  Recommendations for Outpatient Follow-up:  1. Follow up with Dr. Radford Pax in 7-10 days  2. Follow up with PCP in 2-3 weeks 3. Follow up with Dr. Lindi Adie as scheduled  Discharge Diagnoses:  Active Problems:   Mixed hyperlipidemia   Benign essential HTN   Cardiomyopathy, ischemic-new 12/16   Acute respiratory failure (HCC)   Acute respiratory failure with hypoxia (HCC)   Acute on chronic systolic congestive heart failure (HCC)   Acute hypoxemic respiratory failure (HCC)   Chest pain   Elevated troponin   Endotracheally intubated   Palliative care encounter   Goals of care, counseling/discussion   DNR (do not resuscitate)   Cough   Acute respiratory distress (HCC)   CHF (congestive heart failure) ([place])   Slow transit constipation   Encounter for hospice care   Ventricular tachycardia [facility])   Discharge Condition: Improved  Diet recommendation: Heart healthy  Filed Weights   02/09/16 1749 02/10/16 0351 02/11/16 0416  Weight: 66.906 kg (147 lb 8 oz) 64.774 kg (142 lb 12.8 oz) 65.499 kg (144 lb 6.4 oz)    History of present illness:  Please review dictated H and P from 4/6 for details. Briefly, 65 y.o. 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 Admitted 04/06 with SOB secondary to CHF exacerbation. Concern for decompensated heart failure  Hospital Course:  1. Acutely decompensated combined systolic and diastolic CHF exacerbation 1. Patient initially presented in marked sob and intubated on 4/6 2. Pt noted to have NSTEMI with findings of EF of 20% and diffuse hypokinesis 3. Patient was  initially planned for heart cath, however, later declined further intervention given risks 4. Patient was continued on scheduled lasix, changed to PO on 4/12 5. Thus far, net neg 4.2L by day of discharge 6. Wt on discharge 65.5kg 2. NSTEMI 1. Per above, pt had declined cardiac intervention 3. Vtach 1. During this admit, patient did develop 23 beats of vtach 2. Cardiology had been following and pt has expressed wishes against pacemaker 3. Lytes were corrected. Patient was continued on amiodarone and coreg as tolerated 4. Per cardiology, cont amio 400mg  bid x 1 week then 400mg  daily. PFT's were done 4/14 4. GERD 1. Stable 5. Hypokalemia 1. Replaced 2. Continue to follow lytes and correct as needed 6. Hx breast cancer 1. Pt to follow up with Dr. Lindi Adie 7. DM2 1. Cont SSI coverage 8. Hx bipolar 9. DVT prophylaxis 1. SCD's 10. End of Life 1. Wishes noted to be DNR. Appreciate input by Palliative Care. Patient is to d/c to home w/ hospice  Procedures:  Intubation 4/6  Extubation 4/10  Consultations:  Cardiology  Critical Care  Palliative Care  Discharge Exam: Filed Vitals:   02/10/16 1146 02/10/16 1657 02/10/16 1931 02/11/16 0416  BP: 119/50 90/58 113/71 105/56  Pulse: 73 68 69   Temp:  97.9 F (36.6 C) 97.7 F (36.5 C) 97.8 F (36.6 C)  TempSrc:  Oral Oral Oral  Resp: 16 16 16 16   Height:      Weight:    65.499 kg (144 lb 6.4 oz)  SpO2: 100% 100% 100% 100%    General: Awake, in nad Cardiovascular:  regular, s1, s2 Respiratory: normal resp effort, no wheezing  Discharge Instructions     Medication List    STOP taking these medications        ALDACTONE 25 MG tablet  Generic drug:  spironolactone     doxycycline 100 MG capsule  Commonly known as:  VIBRAMYCIN     ZESTRIL 2.5 MG tablet  Generic drug:  lisinopril     ZETIA 10 MG tablet  Generic drug:  ezetimibe      TAKE these medications        amiodarone 400 MG tablet  Commonly known as:   PACERONE  Take 1 tablet (400 mg total) by mouth 2 (two) times daily.     amiodarone 400 MG tablet  Commonly known as:  PACERONE  Take 1 tablet (400 mg total) by mouth daily.  Start taking on:  02/23/2016     aspirin EC 81 MG tablet  Take 81 mg by mouth daily.     BAYER MICROLET LANCETS lancets  Use as instructed     carvedilol 6.25 MG tablet  Commonly known as:  COREG  Take 1 tablet (6.25 mg total) by mouth 2 (two) times daily with a meal.     chlorpheniramine-HYDROcodone 10-8 MG/5ML Suer  Commonly known as:  TUSSIONEX  Take 5 mLs by mouth at bedtime.     CRESTOR 40 MG tablet  Generic drug:  rosuvastatin  TAKE 1 TABLET BY MOUTH EVERY DAY     docusate sodium 100 MG capsule  Commonly known as:  COLACE  Take 1 capsule (100 mg total) by mouth 2 (two) times daily.     ferrous sulfate 325 (65 FE) MG tablet  Take 325 mg by mouth daily with breakfast.     furosemide 40 MG tablet  Commonly known as:  LASIX  Take 1 tablet (40 mg total) by mouth daily.     Glucose Blood Disk  Commonly known as:  BAYER BREEZE 2 TEST  Test blood sugar four times a day     hydrALAZINE 10 MG tablet  Commonly known as:  APRESOLINE  Take 1 tablet (10 mg total) by mouth every 8 (eight) hours.     isosorbide mononitrate 30 MG 24 hr tablet  Commonly known as:  IMDUR  Take 3 tablets (90 mg total) by mouth daily.     lansoprazole 15 MG capsule  Commonly known as:  PREVACID  Take 15 mg by mouth daily.     MULTIVITAL tablet  Take 1 tablet by mouth daily.     nitroGLYCERIN 0.4 MG SL tablet  Commonly known as:  NITROSTAT  Place 1 tablet (0.4 mg total) under the tongue every 5 (five) minutes as needed for chest pain.     olopatadine 0.1 % ophthalmic solution  Commonly known as:  PATANOL  Place 1 drop into both eyes 2 (two) times daily as needed for allergies.     PLAVIX 75 MG tablet  Generic drug:  clopidogrel  TAKE 1 TABLET BY MOUTH EVERY DAY     polyethylene glycol packet  Commonly known  as:  MIRALAX / GLYCOLAX  Take 17 g by mouth daily.       Allergies  Allergen Reactions  . Penicillins Rash    Has patient had a PCN reaction causing immediate rash, facial/tongue/throat swelling, SOB or lightheadedness with hypotension: Yes Has patient had a PCN reaction causing severe rash involving mucus membranes or skin necrosis: Yes Has patient had a PCN reaction that  required hospitalization Yes- admitted to hospital Has patient had a PCN reaction occurring within the last 10 years: No, childhood allergy If all of the above answers are "NO", then may proceed with Cephalosporin use.   . Protamine     Severe hypotension Internal bleeding around heart  . Amlodipine Swelling    Causes swelling in higher dosage   Follow-up Information    Follow up with Colin Benton R., DO. Schedule an appointment as soon as possible for a visit in 2 weeks.   Specialty:  Family Medicine   Why:  Hospital follow up   Contact information:   [place] [address] [phone]       Follow up with Sueanne Margarita, MD. Schedule an appointment as soon as possible for a visit in 1 week.   Specialty:  Cardiology   Why:  Hospital follow up   Contact information:   [id number] [place] [address] [phone]       Schedule an appointment as soon as possible for a visit with Rulon Eisenmenger, MD.   Specialty:  Hematology and Oncology   Contact information:   [place] [address] [phone]        The results of significant diagnostics from this hospitalization (including imaging, microbiology, ancillary and laboratory) are listed below for reference.    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 Pulmonary vascular congestion. Atherosclerotic calcification aorta. Improved pulmonary edema. No pleural effusion or pneumothorax. Bones demineralized. IMPRESSION: Mild pulmonary edema, improved since previous exam. Electronically Signed   By: Lavonia Dana M.D.   On: 02/02/2016 07:28   Dg Chest Port 1 View  02/07/2016  CLINICAL DATA:  Dyspnea EXAM: PORTABLE CHEST 1 VIEW COMPARISON:  02/06/2016 FINDINGS: There is moderate cardiomegaly, unchanged. The lungs are clear except for minor linear basilar opacities. There is no large effusion. There is no pneumothorax. Endotracheal tube and nasogastric tube have been removed. IMPRESSION: Removal of ET and NG tubes. The lungs are grossly clear. No pneumothorax. Electronically Signed   By: Andreas Newport M.D.   On: 02/07/2016 05:54   Dg Chest Port 1 View  02/06/2016  CLINICAL DATA:  Acute respiratory failure, intubated patient. EXAM: PORTABLE CHEST 1 VIEW COMPARISON:  Portable chest x-ray of February 04, 2016 FINDINGS: The lungs are adequately inflated. Patchy interstitial density projects medially in the right lower lobe with a small amount in the medial aspect of the right middle lobe. The left lung is clear. There is no pleural effusion or pneumothorax. The cardiac silhouette remains enlarged. The pulmonary vascularity is not significantly engorged. The endotracheal tube tip lies 4.8 cm above the carina. The esophagogastric tube tip projects below the inferior margin of the image. IMPRESSION: Persistent infiltrate or atelectasis medially in the right middle and lower lobes. Stable cardiomegaly without pulmonary edema. The support tubes are in reasonable position. Electronically Signed   By: David  Martinique M.D.   On: 02/06/2016 07:11   Dg Chest Port 1 View  02/04/2016  CLINICAL DATA:  65 year old female with a history of intubation. Head min 02/02/2016 with respiratory failure. EXAM: PORTABLE CHEST 1 VIEW  COMPARISON:  02/03/2016, 02/02/2016 FINDINGS: Cardiomediastinal silhouette unchanged cardiomegaly. Surgical  changes of prior median sternotomy and CABG. Calcifications of the aortic arch. Surgical staples project over the left heart border. Unchanged position of the endotracheal tube, which terminates suitably above the carina, measuring 3.3 cm. Overlying EKG leads. Gastric tube unchanged, terminating out of the field of view. Compared to the study of 02/02/2016 there has been improved airspace opacity bilateral lungs. Persisting mixed interstitial airspace opacity at the right medial base partially obscuring the right heart border. No pneumothorax.  No large pleural effusion. IMPRESSION: Endotracheal tube terminates suitably above the carina. Unchanged position of gastric tube. Improved airspace disease compared to the chest x-ray of 02/02/2016. Cardiomegaly Signed, Dulcy Fanny. Earleen Newport, DO [facility] Electronically Signed   By: Corrie Mckusick D.O.   On: 02/04/2016 06:56   Dg Chest Portable 1 View  02/03/2016  CLINICAL DATA:  Respiratory failure. EXAM: PORTABLE CHEST 1 VIEW COMPARISON:  02/02/2016. FINDINGS: Endotracheal tube and NG tube in stable position. Prior CABG. Cardiomegaly. Pulmonary vascular prominence with bilateral pulmonary infiltrates/ edema. Slight improvement from prior exam. IMPRESSION: 1. Lines and tubes stable position. 2. Prior CABG. Persistent cardiomegaly and bilateral pulmonary infiltrates/edema. Interim slight improvement from prior exam. Electronically Signed   By: [place]   On: 02/03/2016 07:05   Dg Chest Portable 1 View  02/02/2016  CLINICAL DATA:  Check endotracheal tube placement EXAM: PORTABLE CHEST 1 VIEW COMPARISON:  02/02/2016 FINDINGS: Endotracheal tube is been placed in lies approximately 2 cm above the carina. A nasogastric catheter is coiled within the stomach. Postsurgical changes are seen. Increasing perihilar  densities are noted consistent with frank pulmonary edema. No sizable effusion is seen. No bony abnormality is noted. IMPRESSION: Tubes and lines in satisfactory position. Increasing pulmonary edema Electronically Signed   By: Inez Catalina M.D.   On: 02/02/2016 11:50    Microbiology: Recent Results (from the past 240 hour(s))  MRSA PCR Screening     Status: None   Collection Time: 02/02/16  1:43 PM  Result Value Ref Range Status   MRSA by PCR NEGATIVE NEGATIVE Final    Comment:        The GeneXpert MRSA Assay (FDA approved for NASAL specimens only), is one component of a comprehensive MRSA colonization surveillance program. It is not intended to diagnose MRSA infection nor to guide or monitor treatment for MRSA infections.   Culture, blood (routine x 2)     Status: None   Collection Time: 02/03/16  9:31 AM  Result Value Ref Range Status   Specimen Description BLOOD BLOOD LEFT FOREARM  Final   Special Requests BOTTLES DRAWN AEROBIC AND ANAEROBIC 5CC  Final   Culture NO GROWTH 5 DAYS  Final   Report Status 02/08/2016 FINAL  Final  Culture, blood (routine x 2)     Status: None   Collection Time: 02/03/16  9:38 AM  Result Value Ref Range Status   Specimen Description BLOOD BLOOD RIGHT FOREARM  Final   Special Requests IN PEDIATRIC BOTTLE 4CC  Final   Culture NO GROWTH 5 DAYS  Final   Report Status 02/08/2016 FINAL  Final  Culture, Urine     Status: Abnormal   Collection Time: 02/06/16  4:52 PM  Result Value Ref Range Status   Specimen Description URINE, CATHETERIZED  Final   Special Requests NONE  Final   Culture >=100,000 COLONIES/mL ENTEROCOCCUS SPECIES (A)  Final   Report Status 02/09/2016 FINAL  Final   Organism ID, Bacteria ENTEROCOCCUS SPECIES (A)  Final  Susceptibility   Enterococcus species - MIC*    AMPICILLIN <=2 SENSITIVE Sensitive     LEVOFLOXACIN 1 SENSITIVE Sensitive     NITROFURANTOIN <=16 SENSITIVE Sensitive     VANCOMYCIN 2 SENSITIVE Sensitive     *  >=100,000 COLONIES/mL ENTEROCOCCUS SPECIES     Labs: Basic Metabolic Panel:  Recent Labs Lab 02/05/16 1007  02/07/16 0213 02/08/16 1004 02/09/16 0300 02/10/16 0439 02/11/16 0224  NA 137  < > 141 138 136 135 133*  K 3.5  < > 3.9 4.4 4.7 4.0 4.1  CL 99*  < > 102 98* 98* 96* 95*  CO2 26  < > 26 27 25 27 27   GLUCOSE 133*  < > 109* 138* 133* 119* 111*  BUN 33*  < > 37* 46* 53* 59* 59*  CREATININE 1.31*  < > 1.34* 1.55* 1.68* 1.76* 1.73*  CALCIUM 9.3  < > 9.3 9.6 9.7 9.2 9.2  MG 2.2  --   --  2.3  --   --   --   PHOS 3.3  --   --   --   --   --   --   < > = values in this interval not displayed. Liver Function Tests:  Recent Labs Lab 02/07/16 0213  AST 106*  ALT 25  ALKPHOS 41  BILITOT 1.0  PROT 5.6*  ALBUMIN 2.8*   No results for input(s): LIPASE, AMYLASE in the last 168 hours. No results for input(s): AMMONIA in the last 168 hours. CBC:  Recent Labs Lab 02/05/16 0233 02/05/16 1746 02/06/16 0350 02/07/16 0213  WBC 9.3 6.5 7.5 6.4  HGB 10.1* 9.8* 9.1* 8.9*  HCT 30.3* 30.3* 27.5* 27.7*  MCV 80.4 80.8 80.6 81.7  PLT 120* 121* 118* 140*   Cardiac Enzymes: No results for input(s): CKTOTAL, CKMB, CKMBINDEX, TROPONINI in the last 168 hours. BNP: BNP (last 3 results)  Recent Labs  12/29/15 0607 12/30/15 0345 02/02/16 0700  BNP 1860.2* 1975.5* 1635.8*    ProBNP (last 3 results)  Recent Labs  04/01/15 0738  PROBNP 460.0*    CBG:  Recent Labs Lab 02/10/16 1142 02/10/16 1654 02/10/16 2122 02/11/16 0639 02/11/16 1217  GLUCAP 159* 119* 144* 105* 139*    Signed:  Edwina Grossberg K  [facility] 02/11/2016, 3:47 PM

## 2016-02-11 NOTE — Progress Notes (Signed)
Patient ID: Penny James, female   DOB: 29-Apr-1951, 65 y.o.   MRN: [id number]    Patient Name: Penny James Date of Encounter: 02/11/2016     Active Problems:   Mixed hyperlipidemia   Benign essential HTN   Cardiomyopathy, ischemic-new 12/16   Acute respiratory failure (HCC)   Acute respiratory failure with hypoxia (HCC)   Acute on chronic systolic congestive heart failure (HCC)   Acute hypoxemic respiratory failure (HCC)   Chest pain   Elevated troponin   Endotracheally intubated   Palliative care encounter   Goals of care, counseling/discussion   DNR (do not resuscitate)   Cough   Acute respiratory distress (HCC)   CHF (congestive heart failure) (HCC)   Slow transit constipation   Encounter for hospice care   Ventricular tachycardia ([place])    SUBJECTIVE  Denies chest pain, dyspnea back to baseline. No palpitations.  CURRENT MEDS . amiodarone  400 mg Oral BID  . aspirin  81 mg Per Tube Daily  . carvedilol  6.25 mg Oral BID WC  . chlorpheniramine-HYDROcodone  5 mL Oral QHS  . clopidogrel  75 mg Per Tube Daily  . docusate sodium  100 mg Oral BID  . furosemide  40 mg Oral Daily  . hydrALAZINE  10 mg Oral 3 times per day  . insulin aspart  0-15 Units Subcutaneous TID WC  . insulin aspart  0-5 Units Subcutaneous QHS  . isosorbide mononitrate  90 mg Oral Daily  . pantoprazole  40 mg Oral Daily  . rosuvastatin  40 mg Oral q1800    OBJECTIVE  Filed Vitals:   02/10/16 1146 02/10/16 1657 02/10/16 1931 02/11/16 0416  BP: 119/50 90/58 113/71 105/56  Pulse: 73 68 69   Temp:  97.9 F (36.6 C) 97.7 F (36.5 C) 97.8 F (36.6 C)  TempSrc:  Oral Oral Oral  Resp: 16 16 16 16   Height:      Weight:    144 lb 6.4 oz (65.499 kg)  SpO2: 100% 100% 100% 100%    Intake/Output Summary (Last 24 hours) at 02/11/16 1030 Last data filed at 02/11/16 0700  Gross per 24 hour  Intake    457 ml  Output    650 ml  Net   -193 ml   Filed Weights   02/09/16 1749 02/10/16  0351 02/11/16 0416  Weight: 147 lb 8 oz (66.906 kg) 142 lb 12.8 oz (64.774 kg) 144 lb 6.4 oz (65.499 kg)    PHYSICAL EXAM  General: Pleasant, 65 yo woman, NAD. Neuro: Alert and oriented X 3. Moves all extremities spontaneously. Psych: Normal affect. HEENT:  Normal  Neck: Supple without bruits or JVD. Lungs:  Resp regular and unlabored, CTA. Heart: RRR no s3, s4, or murmurs. Abdomen: Soft, non-tender, non-distended, BS + x 4.  Extremities: No clubbing, cyanosis, trace peripheral edema. DP/PT/Radials 2+ and equal bilaterally.  Accessory Clinical Findings  CBC No results for input(s): WBC, NEUTROABS, HGB, HCT, MCV, PLT in the last 72 hours. Basic Metabolic Panel  Recent Labs  02/10/16 0439 02/11/16 0224  NA 135 133*  K 4.0 4.1  CL 96* 95*  CO2 27 27  GLUCOSE 119* 111*  BUN 59* 59*  CREATININE 1.76* 1.73*  CALCIUM 9.2 9.2   Liver Function Tests No results for input(s): AST, ALT, ALKPHOS, BILITOT, PROT, ALBUMIN in the last 72 hours. No results for input(s): LIPASE, AMYLASE in the last 72 hours. Cardiac Enzymes No results for input(s): CKTOTAL, CKMB, CKMBINDEX, TROPONINI  in the last 72 hours. BNP Invalid input(s): POCBNP D-Dimer No results for input(s): DDIMER in the last 72 hours. Hemoglobin A1C No results for input(s): HGBA1C in the last 72 hours. Fasting Lipid Panel No results for input(s): CHOL, HDL, LDLCALC, TRIG, CHOLHDL, LDLDIRECT in the last 72 hours. 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 Pulmonary vascular congestion. Atherosclerotic calcification aorta. Improved pulmonary edema. No pleural  effusion or pneumothorax. Bones demineralized. IMPRESSION: Mild pulmonary edema, improved since previous exam. Electronically Signed   By: Lavonia Dana M.D.   On: 02/02/2016 07:28   Dg Chest Port 1 View  02/07/2016  CLINICAL DATA:  Dyspnea EXAM: PORTABLE CHEST 1 VIEW COMPARISON:  02/06/2016 FINDINGS: There is moderate cardiomegaly, unchanged. The lungs are clear except for minor linear basilar opacities. There is no large effusion. There is no pneumothorax. Endotracheal tube and nasogastric tube have been removed. IMPRESSION: Removal of ET and NG tubes. The lungs are grossly clear. No pneumothorax. Electronically Signed   By: Andreas Newport M.D.   On: 02/07/2016 05:54   Dg Chest Port 1 View  02/06/2016  CLINICAL DATA:  Acute respiratory failure, intubated patient. EXAM: PORTABLE CHEST 1 VIEW COMPARISON:  Portable chest x-ray of February 04, 2016 FINDINGS: The lungs are adequately inflated. Patchy interstitial density projects medially in the right lower lobe with a small amount in the medial aspect of the right middle lobe. The left lung is clear. There is no pleural effusion or pneumothorax. The cardiac silhouette remains enlarged. The pulmonary vascularity is not significantly engorged. The endotracheal tube tip lies 4.8 cm above the carina. The esophagogastric tube tip projects below the inferior margin of the image. IMPRESSION: Persistent infiltrate or atelectasis medially in the right middle and lower lobes. Stable cardiomegaly without pulmonary edema. The support tubes are in reasonable position. Electronically Signed   By: David  Martinique M.D.   On: 02/06/2016 07:11   Dg Chest Port 1 View  02/04/2016  CLINICAL DATA:  65 year old female with a history of intubation. Head min 02/02/2016 with respiratory failure. EXAM: PORTABLE CHEST 1 VIEW COMPARISON:  02/03/2016, 02/02/2016 FINDINGS: Cardiomediastinal silhouette unchanged cardiomegaly. Surgical changes of prior median sternotomy and CABG. Calcifications of  the aortic arch. Surgical staples project over the left heart border. Unchanged position of the endotracheal tube, which terminates suitably above the carina, measuring 3.3 cm. Overlying EKG leads. Gastric tube unchanged, terminating out of the field of view. Compared to the study of 02/02/2016 there has been improved airspace opacity bilateral lungs. Persisting mixed interstitial airspace opacity at the right medial base partially obscuring the right heart border. No pneumothorax.  No large pleural effusion. IMPRESSION: Endotracheal tube terminates suitably above the carina. Unchanged position of gastric tube. Improved airspace disease compared to the chest x-ray of 02/02/2016. Cardiomegaly Signed, Dulcy Fanny. Earleen Newport, DO [facility] Electronically Signed   By: Corrie Mckusick D.O.   On: 02/04/2016 06:56   Dg Chest Portable 1 View  02/03/2016  CLINICAL DATA:  Respiratory failure. EXAM: PORTABLE CHEST 1 VIEW COMPARISON:  02/02/2016. FINDINGS: Endotracheal tube and NG tube in stable position. Prior CABG. Cardiomegaly. Pulmonary  vascular prominence with bilateral pulmonary infiltrates/ edema. Slight improvement from prior exam. IMPRESSION: 1. Lines and tubes stable position. 2. Prior CABG. Persistent cardiomegaly and bilateral pulmonary infiltrates/edema. Interim slight improvement from prior exam. Electronically Signed   By: [place]   On: 02/03/2016 07:05   Dg Chest Portable 1 View  02/02/2016  CLINICAL DATA:  Check endotracheal tube placement EXAM: PORTABLE CHEST 1 VIEW COMPARISON:  02/02/2016 FINDINGS: Endotracheal tube is been placed in lies approximately 2 cm above the carina. A nasogastric catheter is coiled within the stomach. Postsurgical changes are seen. Increasing perihilar densities are noted consistent with frank pulmonary edema. No sizable effusion is seen. No bony abnormality is noted. IMPRESSION: Tubes and lines in satisfactory position.  Increasing pulmonary edema Electronically Signed   By: Inez Catalina M.D.   On: 02/02/2016 11:50    ASSESSMENT AND PLAN  1. Acute on chronic systolic heart failure - she appears to be back to her baseline and kidney function has bumped. I think that she is stable for discharge. She will need followup with a transition of care appt with Dr. Radford Pax or APP in 7-10 days. 2. VT - she is stable on amiodarone. Continue as noted by Dr. Radford Pax.  ICD not a good option in the setting of multiple comorbidities including CA 3. Acute on chronic kidney failure - her creatinine is now stable. I would hold Aldactone and ACE inhibitor. These may be restarted as an outpatient.  Etheleen Valtierra,M.D.  02/11/2016 10:30 AM

## 2016-02-11 NOTE — Progress Notes (Signed)
Pt had episode of chest pressure while being d/c'd. Relieved with 1 sl NTG. Dr Wyline Copas aware. D/c'd home with family

## 2016-02-11 NOTE — Progress Notes (Signed)
Pt had not had BM since admission. Pt had large BM after Dulcolax supp. For d/c today

## 2016-02-13 ENCOUNTER — Ambulatory Visit (HOSPITAL_COMMUNITY): Payer: Medicare Other

## 2016-02-13 ENCOUNTER — Telehealth: Payer: Self-pay

## 2016-02-13 ENCOUNTER — Telehealth: Payer: Self-pay | Admitting: Cardiology

## 2016-02-13 NOTE — Telephone Encounter (Signed)
Penny James with Hospice would like you to know their physicians will manage the opioids, but will not be attending of record. Pt was dc'd Saturday, and they have not been able to see her yet.  Penny James states normally pt's pcp remains the attending.  Please advise.

## 2016-02-13 NOTE — Telephone Encounter (Signed)
Prior auth for both Lasix and Coreg, brand names sent to [facility].

## 2016-02-13 NOTE — Telephone Encounter (Signed)
Penny James,  Can you call her back. I am not in the office. I do not wish for the pt to have delayed hospice services if she wishes to be under hospice care. Can you figure out what they need and assist. I will be there tomorrow. Thank you.

## 2016-02-13 NOTE — Telephone Encounter (Signed)
[facility] Night - Client Nonclinical Telephone Record [facility] - Client Client Site [facility] - Night Physician Kim, [place] Type Call Call [place] [facility] Page Now Who Is Howe / Callender Name [place] Name [facility] Number [phone] Patient Name Penny James Patient DOB [birth year] Reason for Call Request to speak to Physician Initial Comment [facility] - [phone]. Can Dr Colin Benton be the hospice attending physician? Dec 15, 1950 Jackey Loge Additional Comment Caller would like to ask Dr Maudie Mercury if she will be the patient's hospice physician. Paging DoctorName Phone DateTime Result/Outcome Message Type Notes Alysia Penna [id number] 02/11/2016 4:49:55 PM Paged On Call Back to Call Center Doctor Paged Please call Quillian Quince with [facility] at [phone] regarding a page. Alysia Penna 02/11/2016 5:01:59 PM Spoke with On Call - General Message Result Call Closed By: Honor Loh Transaction Date/Time: 02/11/2016 4:44:16 PM (ET)

## 2016-02-13 NOTE — Telephone Encounter (Signed)
See 4/13 phone note.

## 2016-02-13 NOTE — Telephone Encounter (Signed)
I spoke with Jocelyn Lamer from hospice and she states that the patient has not been seen since she was in the hospital.  She would like a verbal order to go to the patient's home.  Okay to order?

## 2016-02-13 NOTE — Telephone Encounter (Signed)
Informed Hospice that Dr. Radford Pax defers to PCP as Attending for care. They were grateful for call.

## 2016-02-13 NOTE — Telephone Encounter (Signed)
New message    Hospice calling   Penny James has to be admit to hospice at her home - Patient is requesting Dr. Radford Pax be her attending for hospice -    Hospice MD will assist with house calls or pain meds.

## 2016-02-14 ENCOUNTER — Telehealth: Payer: Self-pay

## 2016-02-14 ENCOUNTER — Ambulatory Visit: Payer: Medicare Other | Admitting: Vascular Surgery

## 2016-02-14 NOTE — Telephone Encounter (Signed)
Verbal orders given 02/13/16 per Dr Maudie Mercury.

## 2016-02-14 NOTE — Telephone Encounter (Signed)
Done. Penny James ok yesterday to rachel.

## 2016-02-14 NOTE — Telephone Encounter (Signed)
Both Coreg and Lasix Brand Names, approved by [facility]. Pharmacy notified.

## 2016-02-15 ENCOUNTER — Ambulatory Visit (HOSPITAL_COMMUNITY): Payer: Medicare Other

## 2016-02-15 ENCOUNTER — Other Ambulatory Visit: Payer: Self-pay | Admitting: Cardiology

## 2016-02-15 ENCOUNTER — Encounter: Payer: Self-pay | Admitting: Vascular Surgery

## 2016-02-15 DIAGNOSIS — I10 Essential (primary) hypertension: Secondary | ICD-10-CM

## 2016-02-15 DIAGNOSIS — R7989 Other specified abnormal findings of blood chemistry: Secondary | ICD-10-CM

## 2016-02-16 ENCOUNTER — Encounter: Payer: Self-pay | Admitting: *Deleted

## 2016-02-16 ENCOUNTER — Telehealth: Payer: Self-pay | Admitting: Hematology and Oncology

## 2016-02-16 ENCOUNTER — Ambulatory Visit (INDEPENDENT_AMBULATORY_CARE_PROVIDER_SITE_OTHER): Payer: Medicare Other | Admitting: Family Medicine

## 2016-02-16 DIAGNOSIS — R69 Illness, unspecified: Secondary | ICD-10-CM

## 2016-02-16 NOTE — Progress Notes (Signed)
Late cancel

## 2016-02-16 NOTE — Telephone Encounter (Signed)
Pt could not be seen in a weeks time due to other appointments and transportation issues, confirmed appt for 5/3

## 2016-02-17 ENCOUNTER — Ambulatory Visit (HOSPITAL_COMMUNITY): Payer: Medicare Other

## 2016-02-17 ENCOUNTER — Ambulatory Visit (HOSPITAL_COMMUNITY)
Admission: RE | Admit: 2016-02-17 | Discharge: 2016-02-17 | Disposition: A | Discharge status: Home / Self Care | Payer: Medicare Other | Source: Ambulatory Visit | Attending: Internal Medicine | Admitting: Internal Medicine

## 2016-02-17 VITALS — BP 130/66 | HR 61 | Wt 144.8 lb

## 2016-02-17 DIAGNOSIS — E785 Hyperlipidemia, unspecified: Secondary | ICD-10-CM | POA: Diagnosis not present

## 2016-02-17 DIAGNOSIS — I255 Ischemic cardiomyopathy: Secondary | ICD-10-CM | POA: Diagnosis not present

## 2016-02-17 DIAGNOSIS — Z951 Presence of aortocoronary bypass graft: Secondary | ICD-10-CM | POA: Insufficient documentation

## 2016-02-17 DIAGNOSIS — Z79899 Other long term (current) drug therapy: Secondary | ICD-10-CM | POA: Diagnosis not present

## 2016-02-17 DIAGNOSIS — N183 Chronic kidney disease, stage 3 (moderate): Secondary | ICD-10-CM | POA: Insufficient documentation

## 2016-02-17 DIAGNOSIS — E1142 Type 2 diabetes mellitus with diabetic polyneuropathy: Secondary | ICD-10-CM | POA: Insufficient documentation

## 2016-02-17 DIAGNOSIS — I472 Ventricular tachycardia, unspecified: Secondary | ICD-10-CM

## 2016-02-17 DIAGNOSIS — I252 Old myocardial infarction: Secondary | ICD-10-CM | POA: Diagnosis not present

## 2016-02-17 DIAGNOSIS — I5042 Chronic combined systolic (congestive) and diastolic (congestive) heart failure: Secondary | ICD-10-CM | POA: Insufficient documentation

## 2016-02-17 DIAGNOSIS — Z66 Do not resuscitate: Secondary | ICD-10-CM

## 2016-02-17 DIAGNOSIS — E1122 Type 2 diabetes mellitus with diabetic chronic kidney disease: Secondary | ICD-10-CM | POA: Diagnosis not present

## 2016-02-17 DIAGNOSIS — K219 Gastro-esophageal reflux disease without esophagitis: Secondary | ICD-10-CM | POA: Insufficient documentation

## 2016-02-17 DIAGNOSIS — E11319 Type 2 diabetes mellitus with unspecified diabetic retinopathy without macular edema: Secondary | ICD-10-CM | POA: Insufficient documentation

## 2016-02-17 DIAGNOSIS — Z7902 Long term (current) use of antithrombotics/antiplatelets: Secondary | ICD-10-CM | POA: Diagnosis not present

## 2016-02-17 DIAGNOSIS — Z7982 Long term (current) use of aspirin: Secondary | ICD-10-CM | POA: Diagnosis not present

## 2016-02-17 DIAGNOSIS — I130 Hypertensive heart and chronic kidney disease with heart failure and stage 1 through stage 4 chronic kidney disease, or unspecified chronic kidney disease: Secondary | ICD-10-CM | POA: Diagnosis not present

## 2016-02-17 DIAGNOSIS — Z8249 Family history of ischemic heart disease and other diseases of the circulatory system: Secondary | ICD-10-CM | POA: Insufficient documentation

## 2016-02-17 DIAGNOSIS — Z853 Personal history of malignant neoplasm of breast: Secondary | ICD-10-CM | POA: Insufficient documentation

## 2016-02-17 DIAGNOSIS — I2510 Atherosclerotic heart disease of native coronary artery without angina pectoris: Secondary | ICD-10-CM | POA: Insufficient documentation

## 2016-02-17 MED ORDER — AMIODARONE HCL 400 MG PO TABS: 400.0000 mg | ORAL_TABLET | Freq: Every day | ORAL | Status: DC

## 2016-02-17 MED ORDER — FUROSEMIDE 40 MG PO TABS: 40.0000 mg | ORAL_TABLET | Freq: Every day | ORAL | Status: DC

## 2016-02-17 NOTE — Progress Notes (Signed)
Patient ID: Penny James, female   DOB: August 06, 1951, 65 y.o.   MRN: [id number]   PCP: Primary Cardiologist: Dr Radford Pax   HPI: Penny James is a 65 y.o. female with a history of hx of CAD s/p CABG, HTN, diabetes, ischemic cardiomyopathy, combined systolic and diastolic CHF, Recent dx of breast cancer (plan to start chemo) moderate to severe mitral regurgitation, PAD status post prior renal artery stenting, HL, carotid stenosis status post R CEA, CKD, bipolar affective d/o.    Admitted 12/16 with a non-STEMI. LHC demonstrated occluded vein grafts to the diagonal, ramus and distal LCx. LIMA-LAD was patent. She was not felt to be a surgical candidate. High risk left main PCI with an assist device was felt to be an option. Patient opted for medical therapy.  Admitted 3/2-3/3 with acute on chronic combined systolic and diastolic CHF. Cardiac enzymes remained negative. She was diuresed with IV Lasix. Isosorbide was added to her medical regimen. She had FU carotid US recently and LICA 123456.f/u with Dr. Donnetta Hutching. ? If has followed up of not  Admitted 4/6 through 123456 with ADHF complicated by respiratory failure requiring short term intubation. Evaluated by palliative care and elected DNR. Discharge weight 144 pounds.  Today she presents at request of Dr Radford Pax. Overall feeling ok. SOB with exertion. Able to do some house work but takes breaks.  Denies PND + Orthopnea. Using 3 liters oxygen. Weight at home 144 pounds. Appetite ok. Lives with her husband. [facility] following. She uses a pill box.    ECHO 02/03/2016: EF 20% grade 2 DD.   ROS: All systems negative except as listed in HPI, PMH and Problem List.  SH:  Social History   Social History  . Marital Status: Married    Spouse Name: N/A  . Number of Children: 3  . Years of Education: N/A   Occupational History  . DISABLED    Social History Main Topics  . Smoking status: Never Smoker   . Smokeless tobacco: Never  Used  . Alcohol Use: No  . Drug Use: No  . Sexual Activity: No   Other Topics Concern  . Not on file   Social History Narrative    FH:  Family History  Problem Relation Age of Onset  . Heart disease Brother     Heart Disease before age 44  . Glaucoma Father   . Hypertension Father   . Stroke Paternal Grandmother   . Coronary artery disease Paternal Grandmother   . Cerebral aneurysm Brother   . Clotting disorder Sister     Past Medical History  Diagnosis Date  . Hyperlipidemia   . Arthritis   . Anemia   . Chronic combined systolic and diastolic CHF (congestive heart failure) ([place])     a. EF 55% by cath 2010 but in 09/2015 found to be 20%, with mod-severe MR, grade 2 DD.  . Bipolar affective disorder ([place])   . GERD (gastroesophageal reflux disease)   . Obesity   . Peripheral neuropathy ([place])   . 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 CAD (coronary artery disease)     a. CABG in 2002. b. Cath 09/2015: poor targets, not a good surgical candidate, high risk PCI reserved for recurrent symptoms.  . Carotid artery stenosis     60-79% bilateral ICA stenosis s/p Right  CEA 2013  followed by Dr. Donnetta Hutching  . Mitral regurgitation     a. Mod-severe by echo 09/2015.  . Ischemic cardiomyopathy   . CKD (chronic kidney disease), stage III   . Breast cancer ([place])   . Elevated troponin 02/06/2016  . Ventricular tachycardia ([place]) 02/10/2016    Current Outpatient Prescriptions  Medication Sig Dispense Refill  . carvedilol (COREG) 6.25 MG tablet Take 1 tablet (6.25 mg total) by mouth 2 (two) times daily with a meal. 60 tablet 0  . chlorpheniramine-HYDROcodone (TUSSIONEX) 10-8 MG/5ML SUER Take 5 mLs by mouth at bedtime. 140 mL 0  . CRESTOR 40 MG tablet TAKE 1 TABLET BY MOUTH EVERY DAY 30 tablet 11  . docusate sodium (COLACE) 100 MG capsule Take 1  capsule (100 mg total) by mouth 2 (two) times daily. 10 capsule 0  . furosemide (LASIX) 40 MG tablet Take 1 tablet (40 mg total) by mouth daily. 30 tablet 0  . hydrALAZINE (APRESOLINE) 10 MG tablet Take 1 tablet (10 mg total) by mouth every 8 (eight) hours. 90 tablet 0  . isosorbide mononitrate (IMDUR) 30 MG 24 hr tablet Take 3 tablets (90 mg total) by mouth daily. 30 tablet 0  . lansoprazole (PREVACID) 15 MG capsule Take 15 mg by mouth daily.    [name] olopatadine (PATANOL) 0.1 % ophthalmic solution Place 1 drop into both eyes 2 (two) times daily as needed for allergies.    [name] PLAVIX 75 MG tablet TAKE 1 TABLET BY MOUTH EVERY DAY 90 tablet 0  . polyethylene glycol (MIRALAX / GLYCOLAX) packet Take 17 g by mouth daily. 14 each 0  . amiodarone (PACERONE) 400 MG tablet Take 1 tablet (400 mg total) by mouth 2 (two) times daily. 24 tablet 0  . aspirin EC 81 MG tablet Take 81 mg by mouth daily.    [name] BAYER MICROLET LANCETS lancets Use as instructed 100 each 3  . Glucose Blood (BAYER BREEZE 2 TEST) DISK Test blood sugar four times a day 200 each 5  . nitroGLYCERIN (NITROSTAT) 0.4 MG SL tablet Place 1 tablet (0.4 mg total) under the tongue every 5 (five) minutes as needed for chest pain. 25 tablet 2   No current facility-administered medications for this encounter.    Filed Vitals:   02/17/16 0943  BP: 130/66  Pulse: 61  Weight: 144 lb 12.8 oz (65.681 kg)  SpO2: 100%    PHYSICAL EXAM: General:  Well appearing. No resp difficulty. Son present.  HEENT: normal Neck: supple. JVP 5-6 . Carotids 2+ bilaterally; no bruits. No lymphadenopathy or thryomegaly appreciated. Cor: PMI normal. Regular rate & rhythm. No rubs, gallops or murmurs. Lungs: clear on 3 liters [place] oxygen.  Abdomen: soft, nontender, nondistended. No hepatosplenomegaly. No bruits or masses. Good bowel sounds. Extremities: no cyanosis, clubbing, rash, edema Neuro: alert & orientedx3, cranial nerves grossly intact. Moves all 4 extremities w/o  difficulty. Affect pleasant.    ASSESSMENT & PLAN: 1. Chronic Combine Systolic/Diastolic HF-ICM  0000000 ECHO EF 20% Grade 2 DD. Not interested in ICD.  NYHA III. Volume status stable. Continue lasix 40 mg daily. Instructed to take an extra 40 mg lasix for weight 148 or greater. I have provided a weight chart and discusssed daily weights and to limit fluid intake to < 2 liters daily.  Continue current carvedilol 6.25 mg twice a day Continue hydralazine 10 mg tid + imdur 90 mg daily.  No ace/arb/arni with CKD 2. CAD-s/p remote CABG with cath 09/2015 with all vein  grafts occluded and LIMA to LAD widely patent but distal LAD with diffuse disease. Reviewed by Dr Tamala Julian. She decided not to pursue high risk PCI.    No chest Pain-. Continue plavix, statin, and bb. Continue imdur 90 mg daily.  3. H/O VT- Today cut back amio to 400 mg daily 4. CKD- Creatinine on 4/15 was 1.73 5 DNR- Active patient with [facility]  Follow up in 3 weeks then will likely follow up as needed.   Dmarco Baldus  NP-C   2:35 PM

## 2016-02-17 NOTE — Progress Notes (Signed)
Advanced Heart Failure Medication Review by a Pharmacist  Does the patient  feel that his/her medications are working for him/her?  yes  Has the patient been experiencing any side effects to the medications prescribed?  no  Does the patient measure his/her own blood pressure or blood glucose at home?  no   Does the patient have any problems obtaining medications due to transportation or finances?   no  Understanding of regimen: fair Understanding of indications: fair Potential of compliance: good Patient understands to avoid NSAIDs. Patient understands to avoid decongestants.  Issues to address at subsequent visits: none   Pharmacist comments: Ms Wenzlick is a 66 yo woman here for initial visit in HF clinic. Has [employer] RN who makes home visits to manage her medications and fill pill box. Recently discharged from hospital in 4/15 for ADHF. Discharged on furosemide, carvedilol, hydralazine, and Imdur. Spironolactone stopped d/t fluctuating Cr. Currently has spiro bottle at visit however has not taken any doses. No dizziness, lightheadedness, or headache. Not measuring daily weights but will start this weekend.  Heloise Ochoa, [place].D., BCPS PGY2 Cardiology Pharmacy Resident Pager: [phone]    Time with patient: 10 min Preparation and documentation time: 5 min Total time: 15 min

## 2016-02-17 NOTE — Patient Instructions (Signed)
DECREASE Amiodarone to 400 mg ONCE DAILY.  Continue taking Lasix 40 mg tablet once daily. May take EXTRA 40 mg tablet once daily AS NEEDED for weight 148 lbs or greater. Please call us if you are taking "extra" lasix multiple times per week.  Follow up with Amy Clegg NP-C in 3 weeks.  Do the following things EVERYDAY: 1) Weigh yourself in the morning before breakfast. Write it down and keep it in a log. 2) Take your medicines as prescribed 3) Eat low salt foods-Limit salt (sodium) to 2000 mg per day.  4) Stay as active as you can everyday 5) Limit all fluids for the day to less than 2 liters

## 2016-02-20 ENCOUNTER — Ambulatory Visit (HOSPITAL_COMMUNITY): Payer: Medicare Other

## 2016-02-20 ENCOUNTER — Telehealth: Payer: Self-pay

## 2016-02-20 ENCOUNTER — Other Ambulatory Visit: Payer: Self-pay | Admitting: Cardiology

## 2016-02-20 NOTE — Telephone Encounter (Signed)
Call received from Violeta Gelinas at [facility] - patient is requesting removal of titanium clips placed for biopsy.  Pt is convinced the clips are causing a metallic taste that is preventing her from eating and causing her gums to turn black.  I advised [facility] to contact [facility] as they are the ones that placed the clips.  She voiced understanding.

## 2016-02-21 ENCOUNTER — Ambulatory Visit: Payer: Medicare Other | Admitting: Vascular Surgery

## 2016-02-21 ENCOUNTER — Telehealth: Payer: Self-pay

## 2016-02-21 NOTE — Telephone Encounter (Signed)
Pt called with concerns and frustration that she has contacted [place] several times regarding her desire to have her breast clip removed.  Pt does not understand why she can not come into our office and have Dr. Lindi Adie remove clip.  Pt's hospice RN [facility]ney notified yesterday that clip would need to be removed by [facility] who placed the clip.  I called [facility] back to check progress so that I could assist pt with the adequate information necessary.  [facility] states she contacted [facility] who directed her to Dr. Marlou Starks which she left a message with regarding pts wish to have this removed.  I called pt back to discuss her concerns and explain again that this is not something we could do in our office but that [facility] had contacted the correct individuals and was waiting to hear back.  No other questions or concerns at time of call.

## 2016-02-22 ENCOUNTER — Inpatient Hospital Stay (HOSPITAL_COMMUNITY): Admission: RE | Admit: 2016-02-22 | Payer: Medicare Other | Source: Ambulatory Visit

## 2016-02-22 ENCOUNTER — Ambulatory Visit (HOSPITAL_COMMUNITY): Payer: Medicare Other

## 2016-02-23 ENCOUNTER — Encounter (HOSPITAL_COMMUNITY): Payer: Self-pay | Admitting: Emergency Medicine

## 2016-02-23 ENCOUNTER — Emergency Department (HOSPITAL_COMMUNITY)
Admission: EM | Admit: 2016-02-23 | Discharge: 2016-02-24 | Disposition: A | Discharge status: Home / Self Care | Payer: Medicare Other | Attending: Emergency Medicine | Admitting: Emergency Medicine

## 2016-02-23 DIAGNOSIS — Z853 Personal history of malignant neoplasm of breast: Secondary | ICD-10-CM | POA: Insufficient documentation

## 2016-02-23 DIAGNOSIS — Z8669 Personal history of other diseases of the nervous system and sense organs: Secondary | ICD-10-CM | POA: Insufficient documentation

## 2016-02-23 DIAGNOSIS — Z862 Personal history of diseases of the blood and blood-forming organs and certain disorders involving the immune mechanism: Secondary | ICD-10-CM | POA: Insufficient documentation

## 2016-02-23 DIAGNOSIS — Z9889 Other specified postprocedural states: Secondary | ICD-10-CM | POA: Diagnosis not present

## 2016-02-23 DIAGNOSIS — Z8659 Personal history of other mental and behavioral disorders: Secondary | ICD-10-CM | POA: Diagnosis not present

## 2016-02-23 DIAGNOSIS — M1990 Unspecified osteoarthritis, unspecified site: Secondary | ICD-10-CM | POA: Diagnosis not present

## 2016-02-23 DIAGNOSIS — E1140 Type 2 diabetes mellitus with diabetic neuropathy, unspecified: Secondary | ICD-10-CM | POA: Insufficient documentation

## 2016-02-23 DIAGNOSIS — E669 Obesity, unspecified: Secondary | ICD-10-CM | POA: Insufficient documentation

## 2016-02-23 DIAGNOSIS — Z7982 Long term (current) use of aspirin: Secondary | ICD-10-CM | POA: Diagnosis not present

## 2016-02-23 DIAGNOSIS — E11319 Type 2 diabetes mellitus with unspecified diabetic retinopathy without macular edema: Secondary | ICD-10-CM | POA: Diagnosis not present

## 2016-02-23 DIAGNOSIS — I5042 Chronic combined systolic (congestive) and diastolic (congestive) heart failure: Secondary | ICD-10-CM | POA: Diagnosis not present

## 2016-02-23 DIAGNOSIS — I2510 Atherosclerotic heart disease of native coronary artery without angina pectoris: Secondary | ICD-10-CM | POA: Diagnosis not present

## 2016-02-23 DIAGNOSIS — Z79899 Other long term (current) drug therapy: Secondary | ICD-10-CM | POA: Insufficient documentation

## 2016-02-23 DIAGNOSIS — Z7902 Long term (current) use of antithrombotics/antiplatelets: Secondary | ICD-10-CM | POA: Insufficient documentation

## 2016-02-23 DIAGNOSIS — Z951 Presence of aortocoronary bypass graft: Secondary | ICD-10-CM | POA: Insufficient documentation

## 2016-02-23 DIAGNOSIS — R112 Nausea with vomiting, unspecified: Secondary | ICD-10-CM | POA: Diagnosis present

## 2016-02-23 DIAGNOSIS — N183 Chronic kidney disease, stage 3 (moderate): Secondary | ICD-10-CM | POA: Diagnosis not present

## 2016-02-23 DIAGNOSIS — E876 Hypokalemia: Secondary | ICD-10-CM | POA: Diagnosis not present

## 2016-02-23 LAB — COMPREHENSIVE METABOLIC PANEL
ALK PHOS: 49 U/L (ref 38–126)
ALT: 15 U/L (ref 14–54)
ANION GAP: 13 (ref 5–15)
AST: 22 U/L (ref 15–41)
Albumin: 3.8 g/dL (ref 3.5–5.0)
BILIRUBIN TOTAL: 0.7 mg/dL (ref 0.3–1.2)
BUN: 20 mg/dL (ref 6–20)
CALCIUM: 9.3 mg/dL (ref 8.9–10.3)
CO2: 24 mmol/L (ref 22–32)
Chloride: 95 mmol/L — ABNORMAL LOW (ref 101–111)
Creatinine, Ser: 1.85 mg/dL — ABNORMAL HIGH (ref 0.44–1.00)
GFR, EST AFRICAN AMERICAN: 31 mL/min — AB (ref >60)
GFR, EST NON AFRICAN AMERICAN: 27 mL/min — AB (ref >60)
Glucose, Bld: 113 mg/dL — ABNORMAL HIGH (ref 65–99)
Potassium: 3.4 mmol/L — ABNORMAL LOW (ref 3.5–5.1)
SODIUM: 132 mmol/L — AB (ref 135–145)
TOTAL PROTEIN: 6.4 g/dL — AB (ref 6.5–8.1)

## 2016-02-23 LAB — CBC
HCT: 30.4 % — ABNORMAL LOW (ref 36.0–46.0)
HEMOGLOBIN: 10 g/dL — AB (ref 12.0–15.0)
MCH: 26.7 pg (ref 26.0–34.0)
MCHC: 32.9 g/dL (ref 30.0–36.0)
MCV: 81.1 fL (ref 78.0–100.0)
PLATELETS: 214 10*3/uL (ref 150–400)
RBC: 3.75 MIL/uL — AB (ref 3.87–5.11)
RDW: 16 % — ABNORMAL HIGH (ref 11.5–15.5)
WBC: 3.5 10*3/uL — AB (ref 4.0–10.5)

## 2016-02-23 LAB — URINALYSIS, ROUTINE W REFLEX MICROSCOPIC
Bilirubin Urine: NEGATIVE
Glucose, UA: NEGATIVE mg/dL
Hgb urine dipstick: NEGATIVE
Ketones, ur: NEGATIVE mg/dL
NITRITE: NEGATIVE
PROTEIN: NEGATIVE mg/dL
SPECIFIC GRAVITY, URINE: 1.008 (ref 1.005–1.030)
pH: 5.5 (ref 5.0–8.0)

## 2016-02-23 LAB — URINE MICROSCOPIC-ADD ON

## 2016-02-23 NOTE — ED Notes (Signed)
Pt. reports emesis last Saturday  , poor appetite and " metallic taste " at mouth onset 6 weeks ago , denies fever or diarrhea .

## 2016-02-23 NOTE — Progress Notes (Signed)
Cardiology Office Note:    Date:  02/24/2016   ID:  Penny James, DOB 19-Feb-1951, MRN [id number]  PCP:  Lucretia Kern., DO  Cardiologist:  Dr. Fransico Him   Electrophysiologist:  n/a  Chief Complaint  Patient presents with  . Hospitalization Follow-up    a/c CHF    History of Present Illness:     Penny James is a 65 y.o. female with a hx of CAD s/p CABG, HTN, diabetes, ischemic cardiomyopathy, combined systolic and diastolic CHF, moderate to severe mitral regurgitation, PAD status post prior renal artery stenting, HL, carotid stenosis status post R CEA, CKD, bipolar affective d/o.   Admitted 12/16 with a non-STEMI. LHC demonstrated occluded vein grafts to the diagonal, ramus and distal LCx. LIMA-LAD was patent. She was not felt to be a surgical candidate. High risk left main PCI with an assist device was felt to be an option. Patient opted for medical therapy.  Admitted 3/2-3/3 with acute on chronic combined systolic and diastolic CHF. Cardiac enzymes remained negative. She was diuresed with IV Lasix. Isosorbide was added to her medical regimen.  She was recently dx with breast CA.  Path c/w DCIS.  Plan is for breast conserving surgery with sentinel LN bx with adjuvant chemotherapy followed by adjuvant XR therapy.  Patient has decided to not pursue chemo or radiation.    She had FU carotid US recently and LICA [id number].  She is followed by Dr. Donnetta Hutching. She needs to reschedule an appt with him.    Admitted 4/6-4/15 with a/c combined systolic and diastolic HF requiring intubation.  She developed AKI with diuresis and ACE and Arlyce Harman were on hold at DC.  High risk PCI was d/w patient and she opted for conservative Rx.  She is not felt to be a candidate for ICD.  She did have NSVT noted on Tele and was put on Amiodarone.  She was made DNR. She FU with HF Clinic last week.    Returns for FU today.  She was in the ED last night with n/v. K+ was a little low and she was given K+. She  is overall stable. Denies chest pain.  She feels her breathing is stable.  Denies PND.  Ankle edema is stable.  She denies syncope.  She is convinced she does not have cancer and does not plan to do any cancer treatments.  She still has to reschedule with Dr. Donnetta Hutching.     Past Medical History  Diagnosis Date  . Hyperlipidemia   . Arthritis   . Anemia   . Chronic combined systolic and diastolic CHF (congestive heart failure) ([place])     a. EF 55% by cath 2010 but in 09/2015 found to be 20%, with mod-severe MR, grade 2 DD.  . Bipolar affective disorder ([place])   . GERD (gastroesophageal reflux disease)   . Obesity   . Peripheral neuropathy ([place])   . 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 CAD (coronary artery disease)     a. CABG in 2002. b. Cath 09/2015: poor targets, not a good surgical candidate, high risk PCI reserved for recurrent symptoms.  . Carotid artery stenosis     60-79% bilateral ICA stenosis s/p Right CEA 2013  followed by Dr. Donnetta Hutching  . Mitral regurgitation     a. Mod-severe by echo 09/2015.  [name]  Ischemic cardiomyopathy   . CKD (chronic kidney disease), stage III   . Breast cancer ([place])   . Elevated troponin 02/06/2016  . Ventricular tachycardia ([place]) 02/10/2016    Past Surgical History  Procedure Laterality Date  . Abdominal hysterectomy  1984  . Sp pta renal / visc  artery  2003    with stent of left renal artery  . Tonsillectomy      AS A CHILD  . Endarterectomy  02/18/2012    rocedure: ENDARTERECTOMY CAROTID;  Surgeon: Rosetta Posner, MD;  Location: [facility] OR;  Service: Vascular;  Laterality: Right;  Right Carotid endarterectomy with Dacron patch angioplasty with resection of internal carotid artery  . Coronary artery bypass graft      3 vessel coronary disease S?P CABG with widely patent grafts by cath in 2005 and distal disease past the  graft insertion sites.  . Cardiac catheterization  03/25/09    Occluded SVG to intermediate and SVG to OM/posterior descending artery with PTCA left circ 6/10 s/p PTCA stent in SVG to OM/PDA, 07/2002 s/p PCI of left circ 03/2009  . Cardiac catheterization N/A 10/05/2015    Procedure: Left Heart Cath and Cors/Grafts Angiography;  Surgeon: Troy Sine, MD;  Location: [facility];  Service: Cardiovascular;  Laterality: N/A;    Current Medications: Outpatient Prescriptions Prior to Visit  Medication Sig Dispense Refill  . amiodarone (PACERONE) 400 MG tablet Take 1 tablet (400 mg total) by mouth daily. 30 tablet 6  . aspirin EC 81 MG tablet Take 81 mg by mouth daily.    [name] BAYER MICROLET LANCETS lancets Use as instructed 100 each 3  . carvedilol (COREG) 6.25 MG tablet Take 1 tablet (6.25 mg total) by mouth 2 (two) times daily with a meal. 60 tablet 0  . chlorpheniramine-HYDROcodone (TUSSIONEX) 10-8 MG/5ML SUER Take 5 mLs by mouth at bedtime. 140 mL 0  . furosemide (LASIX) 40 MG tablet Take 1 tablet (40 mg total) by mouth daily. May take extra 40 mg tablet once daily as needed for weight 148 lbs or greater. 45 tablet 6  . Glucose Blood (BAYER BREEZE 2 TEST) DISK Test blood sugar four times a day 200 each 5  . hydrALAZINE (APRESOLINE) 10 MG tablet Take 1 tablet (10 mg total) by mouth every 8 (eight) hours. 90 tablet 0  . isosorbide mononitrate (IMDUR) 30 MG 24 hr tablet Take 3 tablets (90 mg total) by mouth daily. 30 tablet 0  . metoCLOPramide (REGLAN) 5 MG tablet Take 1 tablet (5 mg total) by mouth every 6 (six) hours as needed for nausea (nausea/headache). 20 tablet 0  . nitroGLYCERIN (NITROSTAT) 0.4 MG SL tablet Place 1 tablet (0.4 mg total) under the tongue every 5 (five) minutes as needed for chest pain. 25 tablet 2  . olopatadine (PATANOL) 0.1 % ophthalmic solution Place 1 drop into both eyes 2 (two) times daily as needed for allergies.    [name] PLAVIX 75 MG tablet TAKE 1 TABLET BY MOUTH EVERY  DAY 90 tablet 0   No facility-administered medications prior to visit.     Allergies:   Penicillins; Protamine; and Amlodipine   Social History   Social History  . Marital Status: Married    Spouse Name: N/A  . Number of Children: 3  . Years of Education: N/A   Occupational History  . DISABLED    Social History Main Topics  . Smoking status: Never Smoker   . Smokeless tobacco: Never Used  .  Alcohol Use: No  . Drug Use: No  . Sexual Activity: No   Other Topics Concern  . None   Social History Narrative     Family History:  The patient's family history includes Cerebral aneurysm in her brother; Clotting disorder in her sister; Coronary artery disease in her paternal grandmother; Glaucoma in her father; Heart disease in her brother; Hypertension in her father; Stroke in her paternal grandmother.   ROS:   Please see the history of present illness.    ROSAll other systems reviewed and are negative.   Physical Exam:    VS:  BP 124/60 mmHg  Pulse 64  Resp 22  Ht 5\' 3"  (1.6 m)  Wt 143 lb (64.864 kg)  BMI 25.34 kg/m2   GEN: Well nourished, well developed, in no acute distress HEENT: normal Neck: no JVD, no masses Cardiac: Normal [id number], RRR; 2/6 systolic murmur LLSB, no rubs, or gallops, trace-1+ bilat ankle edema    Respiratory:  clear to auscultation bilaterally; no wheezing, rhonchi or rales GI: soft, nontender, nondistended MS: no deformity or atrophy Skin: warm and dry Neuro: No focal deficits  Psych: Alert and oriented x 3, normal affect   Wt Readings from Last 3 Encounters:  02/24/16 143 lb (64.864 kg)  02/23/16 142 lb (64.411 kg)  02/17/16 144 lb 12.8 oz (65.681 kg)      Studies/Labs Reviewed:     EKG:  EKG is not ordered today.  The ekg ordered today demonstrates n/a ECG from 02/11/16 - NSR, HR 64, IVCD, QTc 482 ms similar to prior tracing ECG 01/06/16 - NSR, HR 70, IVCD, nonspecific ST-T wave changes, QTc 449 ms, no change from prior  tracing  Recent Labs: 04/01/2015: Pro B Natriuretic peptide (BNP) 460.0* 02/02/2016: B Natriuretic Peptide 1635.8* 02/08/2016: Magnesium 2.3 02/09/2016: TSH 2.491 02/23/2016: ALT 15; BUN 20; Creatinine, Ser 1.85*; Hemoglobin 10.0*; Platelets 214; Potassium 3.4*; Sodium 132*   Recent Lipid Panel    Component Value Date/Time   CHOL 108 12/09/2015 1229   TRIG 52.0 12/09/2015 1229   HDL 34.50* 12/09/2015 1229   CHOLHDL 3 12/09/2015 1229   VLDL 10.4 12/09/2015 1229   LDLCALC 63 12/09/2015 1229    Additional studies/ records that were reviewed today include:   Echo 02/03/16 EF 20%, diff HK, Gr 2 DD, MAC, mod MR, severe LAE  Carotid US 12/09/15 R CEA patent with [id number] LICA 123456 R vertebral occluded  LHC 12/16 LM 75% LAD ostial 75%, mid 80%, distal 99% RI ostial 95% LCx ostial 50%, mid 60%, OM1 80% RCA ostial 100% SVG-L PDA 100% SVG-D1 1 100% SVG-RI 100% LIMA-LAD normal Ischemic cardiomyopathy with severe LV dysfunction with an ejection fraction of 20-25% with hypokinesis anterolaterally, focal apical akinesis, severe hypokinesis inferiorly with evidence for probable 3+ mitral regurgitation. Severe native CAD with 75% distal left main stenosis. Prior to trifurcating into the LAD, ramus intermediate, and left circumflex vessel. 75% ostial LAD stenosis with a flush and fill phenomena seen in the mid LAD after the proximal septal and diagonal vessel due to competitive filling from the LIMA graft. 95 - 99% diffuse ramus intermediate stenosis proximally with faint filling the mid vessel which appeared small caliber. Large dominant left circumflex coronary artery with 50% ostial stenosis followed by 30% proximal stenosis, 80% stenosis of a small obtuse marginal branch diffusely proximally, 60% mid AV groove stenosis in the region of another marginal vessel, and mild irregularity distally into the PDA with evidence for collateralization to a nondominant RCA  retrograde up to near its  ostium. Total flush occlusion of the native RCA at its origin. Total occlusion of all 3 saphenous vein grafts arising from the aorta which previously had supplied the diagonal vessel, a ramus intermediate vessel, and sequentially supplied a marginal branch and PDA branch of a dominant circumflex. Patent LIMA graft that supplies the mid LAD. The LAD fills proximal to the insertion and there is narrowing of 80% prior to giving rise to a moderate-sized diagonal vessel. Beyond the anastomosis, after the takeoff of a smaller diagonal vessel which collateralizes portion of the previous diagonal, the distal portion of the native LAD had a 99% stenosis before the apex. RECOMMENDATION: Angiograms will be reviewed with colleagues. Prior to any decision concerning possible intervention, Dr. Cyndia Bent will be consulted to see if the patient is a candidate for redo CABG surgery in light of her graft occlusions and distal left main disease. If she is turned down for surgery, consider possible percutaneous coronary intervention into the distal native LAD via the LIMA graft and possible high risk left main and circumflex intervention with hemodynamic support.  Echo 12/16 EF 20-25%, diffuse HK with inferolateral AK, grade 2 diastolic dysfunction, MAC, moderate to severe MR, moderate LAE, PASP 48 mmHg  Myoview 7/10 Apical Lat ischemia, inf and apical ischemia, EF 53%   ASSESSMENT:     1. Chronic combined systolic and diastolic congestive heart failure ([place])   2. Ischemic cardiomyopathy   3. Coronary artery disease involving native coronary artery of native heart without angina pectoris   4. Carotid stenosis, bilateral   5. Breast cancer of upper-outer quadrant of left female breast ([place])   6. Mitral regurgitation   7. CKD (chronic kidney disease), unspecified stage   8. Essential hypertension     PLAN:     In order of problems listed above:  1. Chronic Combined Systolic and Diastolic CHF - Volume  stable.  She is now followed in HF Clinic. She is now on Hospice.  K+ last night was slightly low and she was given K+.  Will arrange BMET.    2. Ischemic CM - EF 20%.  Continue nitrates, Hydralazine, beta-blocker.  No longer on an ACE inhibitor, Spironolactone due to CKD.    3. CAD - No angina.  Continue ASA, Plavix, nitrates, beta-blocker.  She declined high risk PCI in the hospital.  Hospice stopped her statin.  4. Carotid stenosis - She is to FU with [facility] as planned.   5. Breast CA - She was to start chemo and radiation but she tells me that she plans to decline treatment.     6. Mitral Regurgitation - Mod by recent echo.    7. CKD -  Arrange repeat BMET.   8. HTN - Controlled.     Medication Adjustments/Labs and Tests Ordered: Current medicines are reviewed at length with the patient today.  Concerns regarding medicines are outlined above.  Medication changes, Labs and Tests ordered today are outlined in the Patient Instructions noted below. Patient Instructions  Medication Instructions:  No changes - See your medication list. Labwork: Your physician recommends that you return for lab work in: bmet on Monday May 1 Testing/Procedures: None  Follow-Up: Heart Failure Clinic as scheduled. Dr. Fransico Him in 2 months.  Any Other Special Instructions Will Be Listed Below (If Applicable). If you need a refill on your cardiac medications before your next appointment, please call your pharmacy.    Signed, Richardson Dopp, PA-C  02/24/2016 1:24 PM  [facility] [address], [place], [place]  [address] Phone: [phone]; Fax: [phone]

## 2016-02-23 NOTE — ED Provider Notes (Signed)
CSN: [id number]     Arrival date & time 02/23/16  1912 History  By signing my name below, I, Evelene Croon, attest that this documentation has been prepared under the direction and in the presence of Orpah Greek, MD . Electronically Signed: Evelene Croon, Scribe. 02/23/2016. 11:58 PM.    Chief Complaint  Patient presents with  . Emesis   The history is provided by the patient. No language interpreter was used.    HPI Comments:  Penny James is a 65 y.o. female with a history of DM, CAD, breast CA and CKD, who presents to the Emergency Department complaining of nausea and vomiting x 2 days. She has had 1 episode of vomiting daily. Pt states she had a plutonium clip placed ~ 6 weeks ago in right breast; states it has made her nauseous.  Pt has been taking zofran with little relief. She reports associated decreased appetite. She denies diarrhea, abdominal pain   Past Medical History  Diagnosis Date  . Hyperlipidemia   . Arthritis   . Anemia   . Chronic combined systolic and diastolic CHF (congestive heart failure) ([place])     a. EF 55% by cath 2010 but in 09/2015 found to be 20%, with mod-severe MR, grade 2 DD.  . Bipolar affective disorder ([place])   . GERD (gastroesophageal reflux disease)   . Obesity   . Peripheral neuropathy ([place])   . 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 CAD (coronary artery disease)     a. CABG in 2002. b. Cath 09/2015: poor targets, not a good surgical candidate, high risk PCI reserved for recurrent symptoms.  . Carotid artery stenosis     60-79% bilateral ICA stenosis s/p Right CEA 2013  followed by Dr. Donnetta Hutching  . Mitral regurgitation     a. Mod-severe by echo 09/2015.  . Ischemic cardiomyopathy   . CKD (chronic kidney disease), stage III   . Breast cancer ([place])   . Elevated troponin 02/06/2016  .  Ventricular tachycardia ([place]) 02/10/2016   Past Surgical History  Procedure Laterality Date  . Abdominal hysterectomy  1984  . Sp pta renal / visc  artery  2003    with stent of left renal artery  . Tonsillectomy      AS A CHILD  . Endarterectomy  02/18/2012    rocedure: ENDARTERECTOMY CAROTID;  Surgeon: Rosetta Posner, MD;  [facility];  Service: Vascular;  Laterality: Right;  Right Carotid endarterectomy with Dacron patch angioplasty with resection of internal carotid artery  . Coronary artery bypass graft      3 vessel coronary disease S?P CABG with widely patent grafts by cath in 2005 and distal disease past the graft insertion sites.  . Cardiac catheterization  03/25/09    Occluded SVG to intermediate and SVG to OM/posterior descending artery with PTCA left circ 6/10 s/p PTCA stent in SVG to OM/PDA, 07/2002 s/p PCI of left circ 03/2009  . Cardiac catheterization N/A 10/05/2015    Procedure: Left Heart Cath and Cors/Grafts Angiography;  Surgeon: Troy Sine, MD;  Location: [facility];  Service: Cardiovascular;  Laterality: N/A;   Family History  Problem Relation Age of Onset  . Heart disease Brother     Heart Disease before age 97  . Glaucoma Father   . Hypertension Father   .  Stroke Paternal Grandmother   . Coronary artery disease Paternal Grandmother   . Cerebral aneurysm Brother   . Clotting disorder Sister    Social History  Substance Use Topics  . Smoking status: Never Smoker   . Smokeless tobacco: Never Used  . Alcohol Use: No   OB History    No data available     Review of Systems    Allergies  Penicillins; Protamine; and Amlodipine  Home Medications   Prior to Admission medications   Medication Sig Start Date End Date Taking? Authorizing Provider  amiodarone (PACERONE) 400 MG tablet Take 1 tablet (400 mg total) by mouth daily. 02/17/16 02/28/16 Yes Amy D Clegg, NP  aspirin EC 81 MG tablet Take 81 mg by mouth daily.   Yes Historical Provider, MD   BAYER MICROLET LANCETS lancets Use as instructed 08/19/15  Yes Lucretia Kern, DO  carvedilol (COREG) 6.25 MG tablet Take 1 tablet (6.25 mg total) by mouth 2 (two) times daily with a meal. 02/11/16  Yes Donne Hazel, MD  chlorpheniramine-HYDROcodone (TUSSIONEX) 10-8 MG/5ML SUER Take 5 mLs by mouth at bedtime. 02/11/16  Yes Donne Hazel, MD  furosemide (LASIX) 40 MG tablet Take 1 tablet (40 mg total) by mouth daily. May take extra 40 mg tablet once daily as needed for weight 148 lbs or greater. 02/17/16  Yes Amy D Clegg, NP  Glucose Blood (BAYER BREEZE 2 TEST) DISK Test blood sugar four times a day 08/19/15  Yes Lucretia Kern, DO  hydrALAZINE (APRESOLINE) 10 MG tablet Take 1 tablet (10 mg total) by mouth every 8 (eight) hours. 02/11/16  Yes Donne Hazel, MD  isosorbide mononitrate (IMDUR) 30 MG 24 hr tablet Take 3 tablets (90 mg total) by mouth daily. 02/11/16  Yes Donne Hazel, MD  nitroGLYCERIN (NITROSTAT) 0.4 MG SL tablet Place 1 tablet (0.4 mg total) under the tongue every 5 (five) minutes as needed for chest pain. 10/10/15  Yes Luke K Kilroy, PA-C  olopatadine (PATANOL) 0.1 % ophthalmic solution Place 1 drop into both eyes 2 (two) times daily as needed for allergies.   Yes Historical Provider, MD  PLAVIX 75 MG tablet TAKE 1 TABLET BY MOUTH EVERY DAY 12/19/15  Yes Sueanne Margarita, MD  metoCLOPramide (REGLAN) 5 MG tablet Take 1 tablet (5 mg total) by mouth every 6 (six) hours as needed for nausea (nausea/headache). 02/24/16   Orpah Greek, MD   BP 145/64 mmHg  Pulse 62  Temp(Src) 97.9 F (36.6 C) (Axillary)  Ht 5\' 3"  (1.6 m)  Wt 142 lb (64.411 kg)  BMI 25.16 kg/m2  SpO2 99% Physical Exam  Constitutional: She is oriented to person, place, and time. She appears well-developed and well-nourished. No distress.  HENT:  Head: Normocephalic and atraumatic.  Right Ear: Hearing normal.  Left Ear: Hearing normal.  Nose: Nose normal.  Mouth/Throat: Oropharynx is clear and moist and mucous  membranes are normal.  Eyes: Conjunctivae and EOM are normal. Pupils are equal, round, and reactive to light.  Neck: Normal range of motion. Neck supple.  Cardiovascular: Regular rhythm, S1 normal and S2 normal.  Exam reveals no gallop and no friction rub.   No murmur heard. Pulmonary/Chest: Effort normal and breath sounds normal. No respiratory distress. She exhibits no tenderness.  Abdominal: Soft. Normal appearance and bowel sounds are normal. There is no hepatosplenomegaly. There is no tenderness. There is no rebound, no guarding, no tenderness at McBurney's point and negative Murphy's sign. No hernia.  Musculoskeletal: Normal range of motion.  Neurological: She is alert and oriented to person, place, and time. She has normal strength. No cranial nerve deficit or sensory deficit. Coordination normal. GCS eye subscore is 4. GCS verbal subscore is 5. GCS motor subscore is 6.  Skin: Skin is warm, dry and intact. No rash noted. No cyanosis.  Psychiatric: She has a normal mood and affect. Her speech is normal and behavior is normal. Thought content normal.  Nursing note and vitals reviewed.   ED Course  Procedures   DIAGNOSTIC STUDIES:  Oxygen Saturation is 100% on RA, normal by my interpretation.    COORDINATION OF CARE:  12:17 AM Discussed treatment plan with pt at bedside and pt agreed to plan.  Labs Review Labs Reviewed  COMPREHENSIVE METABOLIC PANEL - Abnormal; Notable for the following:    Sodium 132 (*)    Potassium 3.4 (*)    Chloride 95 (*)    Glucose, Bld 113 (*)    Creatinine, Ser 1.85 (*)    Total Protein 6.4 (*)    GFR calc non Af Amer 27 (*)    GFR calc Af Amer 31 (*)    All other components within normal limits  CBC - Abnormal; Notable for the following:    WBC 3.5 (*)    RBC 3.75 (*)    Hemoglobin 10.0 (*)    HCT 30.4 (*)    RDW 16.0 (*)    All other components within normal limits  URINALYSIS, ROUTINE W REFLEX MICROSCOPIC (NOT AT [facility]) - Abnormal; Notable  for the following:    APPearance CLOUDY (*)    Leukocytes, UA TRACE (*)    All other components within normal limits  URINE MICROSCOPIC-ADD ON - Abnormal; Notable for the following:    Squamous Epithelial / LPF 0-5 (*)    Bacteria, UA MANY (*)    All other components within normal limits    Imaging Review No results found. I have personally reviewed and evaluated these images and lab results as part of my medical decision-making.   EKG Interpretation None      MDM   Final diagnoses:  Nausea and vomiting, vomiting of unspecified type  Hypokalemia    Patient presented to the ER for evaluation of nausea and vomiting. Patient reports that she has been experiencing nausea and inability to eat secondary to treatment for breast cancer. She has a metallic taste in her mouth which causes her not to eat. She has constant nausea and vomits once a day. She does take Zofran, but is only taking it once a day. She was encouraged to increase use of the Zofran as needed and also was given a supply of Reglan to be used in addition if needed. Patient did have very slight hypokalemia. She was given potassium here in the ER, we'll not be continued on potassium supplementation because she also takes Aldactone.  Had recent an STEMI and resultant ischemic cardiomyopathy. She appears to be well compensated today, no concern for volume overload. As she is only vomiting once a day, she does not require any IV fluids. All of her vital signs are essentially normal.  I personally performed the services described in this documentation, which was scribed in my presence. The recorded information has been reviewed and is accurate.    Orpah Greek, MD 02/24/16 [id number]

## 2016-02-24 ENCOUNTER — Ambulatory Visit (HOSPITAL_COMMUNITY): Payer: Medicare Other

## 2016-02-24 ENCOUNTER — Encounter: Payer: Self-pay | Admitting: Physician Assistant

## 2016-02-24 ENCOUNTER — Ambulatory Visit (INDEPENDENT_AMBULATORY_CARE_PROVIDER_SITE_OTHER): Admitting: Physician Assistant

## 2016-02-24 VITALS — BP 124/60 | HR 64 | Resp 22 | Ht 63.0 in | Wt 143.0 lb

## 2016-02-24 DIAGNOSIS — N189 Chronic kidney disease, unspecified: Secondary | ICD-10-CM

## 2016-02-24 DIAGNOSIS — I255 Ischemic cardiomyopathy: Secondary | ICD-10-CM

## 2016-02-24 DIAGNOSIS — I2510 Atherosclerotic heart disease of native coronary artery without angina pectoris: Secondary | ICD-10-CM | POA: Diagnosis not present

## 2016-02-24 DIAGNOSIS — I5042 Chronic combined systolic (congestive) and diastolic (congestive) heart failure: Secondary | ICD-10-CM | POA: Diagnosis not present

## 2016-02-24 DIAGNOSIS — I6523 Occlusion and stenosis of bilateral carotid arteries: Secondary | ICD-10-CM | POA: Diagnosis not present

## 2016-02-24 DIAGNOSIS — I10 Essential (primary) hypertension: Secondary | ICD-10-CM

## 2016-02-24 DIAGNOSIS — C50412 Malignant neoplasm of upper-outer quadrant of left female breast: Secondary | ICD-10-CM

## 2016-02-24 DIAGNOSIS — I340 Nonrheumatic mitral (valve) insufficiency: Secondary | ICD-10-CM

## 2016-02-24 MED ORDER — METOCLOPRAMIDE HCL 5 MG PO TABS: 5.0000 mg | ORAL_TABLET | Freq: Four times a day (QID) | ORAL | Status: DC | PRN

## 2016-02-24 MED ORDER — POTASSIUM CHLORIDE CRYS ER 20 MEQ PO TBCR
20.0000 meq | EXTENDED_RELEASE_TABLET | Freq: Once | ORAL | Status: AC
  Administered 2016-02-24: 20 meq via ORAL
  Filled 2016-02-24: qty 1

## 2016-02-24 MED ORDER — ONDANSETRON 4 MG PO TBDP
8.0000 mg | ORAL_TABLET | Freq: Once | ORAL | Status: AC
  Administered 2016-02-24: 8 mg via ORAL
  Filled 2016-02-24: qty 2

## 2016-02-24 NOTE — Patient Instructions (Addendum)
Medication Instructions:  No changes - See your medication list. Labwork: Your physician recommends that you return for lab work in: bmet on Monday May 1 Testing/Procedures: None  Follow-Up: Heart Failure Clinic as scheduled. Dr. Fransico Him in 2 months.  Any Other Special Instructions Will Be Listed Below (If Applicable). If you need a refill on your cardiac medications before your next appointment, please call your pharmacy.

## 2016-02-24 NOTE — Discharge Instructions (Signed)
Nausea and Vomiting Nausea is a sick feeling that often comes before throwing up (vomiting). Vomiting is a reflex where stomach contents come out of your mouth. Vomiting can cause severe loss of body fluids (dehydration). Children and elderly adults can become dehydrated quickly, especially if they also have diarrhea. Nausea and vomiting are symptoms of a condition or disease. It is important to find the cause of your symptoms. CAUSES   Direct irritation of the stomach lining. This irritation can result from increased acid production (gastroesophageal reflux disease), infection, food poisoning, taking certain medicines (such as nonsteroidal anti-inflammatory drugs), alcohol use, or tobacco use.  Signals from the brain.These signals could be caused by a headache, heat exposure, an inner ear disturbance, increased pressure in the brain from injury, infection, a tumor, or a concussion, pain, emotional stimulus, or metabolic problems.  An obstruction in the gastrointestinal tract (bowel obstruction).  Illnesses such as diabetes, hepatitis, gallbladder problems, appendicitis, kidney problems, cancer, sepsis, atypical symptoms of a heart attack, or eating disorders.  Medical treatments such as chemotherapy and radiation.  Receiving medicine that makes you sleep (general anesthetic) during surgery. DIAGNOSIS Your caregiver may ask for tests to be done if the problems do not improve after a few days. Tests may also be done if symptoms are severe or if the reason for the nausea and vomiting is not clear. Tests may include:  Urine tests.  Blood tests.  Stool tests.  Cultures (to look for evidence of infection).  X-rays or other imaging studies. Test results can help your caregiver make decisions about treatment or the need for additional tests. TREATMENT You need to stay well hydrated. Drink frequently but in small amounts.You may wish to drink water, sports drinks, clear broth, or eat frozen  ice pops or gelatin dessert to help stay hydrated.When you eat, eating slowly may help prevent nausea.There are also some antinausea medicines that may help prevent nausea. HOME CARE INSTRUCTIONS   Take all medicine as directed by your caregiver.  If you do not have an appetite, do not force yourself to eat. However, you must continue to drink fluids.  If you have an appetite, eat a normal diet unless your caregiver tells you differently.  Eat a variety of complex carbohydrates (rice, wheat, potatoes, bread), lean meats, yogurt, fruits, and vegetables.  Avoid high-fat foods because they are more difficult to digest.  Drink enough water and fluids to keep your urine clear or pale yellow.  If you are dehydrated, ask your caregiver for specific rehydration instructions. Signs of dehydration may include:  Severe thirst.  Dry lips and mouth.  Dizziness.  Dark urine.  Decreasing urine frequency and amount.  Confusion.  Rapid breathing or pulse. SEEK IMMEDIATE MEDICAL CARE IF:   You have blood or brown flecks (like coffee grounds) in your vomit.  You have black or bloody stools.  You have a severe headache or stiff neck.  You are confused.  You have severe abdominal pain.  You have chest pain or trouble breathing.  You do not urinate at least once every 8 hours.  You develop cold or clammy skin.  You continue to vomit for longer than 24 to 48 hours.  You have a fever. MAKE SURE YOU:   Understand these instructions.  Will watch your condition.  Will get help right away if you are not doing well or get worse.   This information is not intended to replace advice given to you by your health care provider. Make sure  you discuss any questions you have with your health care provider.   Document Released: 10/15/2005 Document Revised: 01/07/2012 Document Reviewed: 03/14/2011 Elsevier Interactive Patient Education 2016 [place].  Potassium Content of  Foods Potassium is a mineral found in many foods and drinks. It helps keep fluids and minerals balanced in your body and affects how steadily your heart beats. Potassium also helps control your blood pressure and keep your muscles and nervous system healthy. Certain health conditions and medicines may change the balance of potassium in your body. When this happens, you can help balance your level of potassium through the foods that you do or do not eat. Your health care provider or dietitian may recommend an amount of potassium that you should have each day. The following lists of foods provide the amount of potassium (in parentheses) per serving in each item. HIGH IN POTASSIUM  The following foods and beverages have 200 mg or more of potassium per serving:  Apricots, 2 raw or 5 dry (200 mg).  Artichoke, 1 medium (345 mg).  Avocado, raw,  each (245 mg).  Banana, 1 medium (425 mg).  Beans, lima, or baked beans, canned,  cup (280 mg).  Beans, white, canned,  cup (595 mg).  Beef roast, 3 oz (320 mg).  Beef, ground, 3 oz (270 mg).  Beets, raw or cooked,  cup (260 mg).  Bran muffin, 2 oz (300 mg).  Broccoli,  cup (230 mg).  Brussels sprouts,  cup (250 mg).  Cantaloupe,  cup (215 mg).  Cereal, 100% bran,  cup (200-400 mg).  Cheeseburger, single, fast food, 1 each (225-400 mg).  Chicken, 3 oz (220 mg).  Clams, canned, 3 oz (535 mg).  Crab, 3 oz (225 mg).  Dates, 5 each (270 mg).  Dried beans and peas,  cup (300-475 mg).  Figs, dried, 2 each (260 mg).  Fish: halibut, tuna, cod, snapper, 3 oz (480 mg).  Fish: salmon, haddock, swordfish, perch, 3 oz (300 mg).  Fish, tuna, canned 3 oz (200 mg).  [place] fries, fast food, 3 oz (470 mg).  Granola with fruit and nuts,  cup (200 mg).  Grapefruit juice,  cup (200 mg).  Greens, beet,  cup (655 mg).  Honeydew melon,  cup (200 mg).  Kale, raw, 1 cup (300 mg).  Kiwi, 1 medium (240 mg).  Kohlrabi, rutabaga,  parsnips,  cup (280 mg).  Lentils,  cup (365 mg).  Mango, 1 each (325 mg).  Milk, chocolate, 1 cup (420 mg).  Milk: nonfat, low-fat, whole, buttermilk, 1 cup (350-380 mg).  Molasses, 1 Tbsp (295 mg).  Mushrooms,  cup (280) mg.  Nectarine, 1 each (275 mg).  Nuts: almonds, peanuts, hazelnuts, [place], cashew, mixed, 1 oz (200 mg).  Nuts, pistachios, 1 oz (295 mg).  Orange, 1 each (240 mg).  Orange juice,  cup (235 mg).  Papaya, medium,  fruit (390 mg).  Peanut butter, chunky, 2 Tbsp (240 mg).  Peanut butter, smooth, 2 Tbsp (210 mg).  Pear, 1 medium (200 mg).  Pomegranate, 1 whole (400 mg).  Pomegranate juice,  cup (215 mg).  Pork, 3 oz (350 mg).  Potato chips, salted, 1 oz (465 mg).  Potato, baked with skin, 1 medium (925 mg).  Potatoes, boiled,  cup (255 mg).  Potatoes, mashed,  cup (330 mg).  Prune juice,  cup (370 mg).  Prunes, 5 each (305 mg).  Pudding, chocolate,  cup (230 mg).  Pumpkin, canned,  cup (250 mg).  Raisins, seedless,  cup (270  mg).  Seeds, sunflower or pumpkin, 1 oz (240 mg).  Soy milk, 1 cup (300 mg).  Spinach,  cup (420 mg).  Spinach, canned,  cup (370 mg).  Sweet potato, baked with skin, 1 medium (450 mg).  Swiss chard,  cup (480 mg).  Tomato or vegetable juice,  cup (275 mg).  Tomato sauce or puree,  cup (400-550 mg).  Tomato, raw, 1 medium (290 mg).  Tomatoes, canned,  cup (200-300 mg).  [place], 3 oz (250 mg).  Wheat germ, 1 oz (250 mg).  Winter squash,  cup (250 mg).  Yogurt, plain or fruited, 6 oz (260-435 mg).  Zucchini,  cup (220 mg). MODERATE IN POTASSIUM The following foods and beverages have 50-200 mg of potassium per serving:  Apple, 1 each (150 mg).  Apple juice,  cup (150 mg).  Applesauce,  cup (90 mg).  Apricot nectar,  cup (140 mg).  Asparagus, small spears,  cup or 6 spears (155 mg).  Bagel, cinnamon raisin, 1 each (130 mg).  Bagel, egg or plain, 4 in., 1 each  (70 mg).  Beans, green,  cup (90 mg).  Beans, yellow,  cup (190 mg).  Beer, regular, 12 oz (100 mg).  Beets, canned,  cup (125 mg).  Blackberries,  cup (115 mg).  Blueberries,  cup (60 mg).  Bread, whole wheat, 1 slice (70 mg).  Broccoli, raw,  cup (145 mg).  Cabbage,  cup (150 mg).  Carrots, cooked or raw,  cup (180 mg).  Cauliflower, raw,  cup (150 mg).  Celery, raw,  cup (155 mg).  Cereal, bran flakes, cup (120-150 mg).  Cheese, cottage,  cup (110 mg).  Cherries, 10 each (150 mg).  Chocolate, 1 oz bar (165 mg).  Coffee, brewed 6 oz (90 mg).  Corn,  cup or 1 ear (195 mg).  Cucumbers,  cup (80 mg).  Egg, large, 1 each (60 mg).  Eggplant,  cup (60 mg).  Endive, raw, cup (80 mg).  English muffin, 1 each (65 mg).  Fish, orange roughy, 3 oz (150 mg).  Frankfurter, beef or pork, 1 each (75 mg).  Fruit cocktail,  cup (115 mg).  Grape juice,  cup (170 mg).  Grapefruit,  fruit (175 mg).  Grapes,  cup (155 mg).  Greens: kale, turnip, collard,  cup (110-150 mg).  Ice cream or frozen yogurt, chocolate,  cup (175 mg).  Ice cream or frozen yogurt, vanilla,  cup (120-150 mg).  Lemons, limes, 1 each (80 mg).  Lettuce, all types, 1 cup (100 mg).  Mixed vegetables,  cup (150 mg).  Mushrooms, raw,  cup (110 mg).  Nuts: walnuts, pecans, or macadamia, 1 oz (125 mg).  Oatmeal,  cup (80 mg).  Okra,  cup (110 mg).  Onions, raw,  cup (120 mg).  Peach, 1 each (185 mg).  Peaches, canned,  cup (120 mg).  Pears, canned,  cup (120 mg).  Peas, green, frozen,  cup (90 mg).  Peppers, green,  cup (130 mg).  Peppers, red,  cup (160 mg).  Pineapple juice,  cup (165 mg).  Pineapple, fresh or canned,  cup (100 mg).  Plums, 1 each (105 mg).  Pudding, vanilla,  cup (150 mg).  Raspberries,  cup (90 mg).  Rhubarb,  cup (115 mg).  Rice, wild,  cup (80 mg).  Shrimp, 3 oz (155 mg).  Spinach, raw, 1 cup (170  mg).  Strawberries,  cup (125 mg).  Summer squash  cup (175-200 mg).  Swiss chard, raw, 1 cup (  135 mg).  Tangerines, 1 each (140 mg).  Tea, brewed, 6 oz (65 mg).  Turnips,  cup (140 mg).  Watermelon,  cup (85 mg).  Wine, red, table, 5 oz (180 mg).  Wine, white, table, 5 oz (100 mg). LOW IN POTASSIUM The following foods and beverages have less than 50 mg of potassium per serving.  Bread, white, 1 slice (30 mg).  Carbonated beverages, 12 oz (less than 5 mg).  Cheese, 1 oz (20-30 mg).  Cranberries,  cup (45 mg).  Cranberry juice cocktail,  cup (20 mg).  Fats and oils, 1 Tbsp (less than 5 mg).  Hummus, 1 Tbsp (32 mg).  Nectar: papaya, mango, or pear,  cup (35 mg).  Rice, white or brown,  cup (50 mg).  Spaghetti or macaroni,  cup cooked (30 mg).  Tortilla, flour or corn, 1 each (50 mg).  Waffle, 4 in., 1 each (50 mg).  Water chestnuts,  cup (40 mg).   This information is not intended to replace advice given to you by your health care provider. Make sure you discuss any questions you have with your health care provider.   Document Released: 05/29/2005 Document Revised: 10/20/2013 Document Reviewed: 09/11/2013 Elsevier Interactive Patient Education [employer].

## 2016-02-27 ENCOUNTER — Encounter: Payer: Self-pay | Admitting: Family Medicine

## 2016-02-27 ENCOUNTER — Telehealth: Payer: Self-pay | Admitting: *Deleted

## 2016-02-27 ENCOUNTER — Telehealth: Payer: Self-pay | Admitting: Family Medicine

## 2016-02-27 ENCOUNTER — Ambulatory Visit (HOSPITAL_COMMUNITY): Payer: Medicare Other

## 2016-02-27 ENCOUNTER — Ambulatory Visit (INDEPENDENT_AMBULATORY_CARE_PROVIDER_SITE_OTHER): Payer: Medicare Other | Admitting: Family Medicine

## 2016-02-27 ENCOUNTER — Telehealth: Payer: Self-pay | Admitting: Cardiology

## 2016-02-27 ENCOUNTER — Other Ambulatory Visit: Payer: Medicare Other

## 2016-02-27 VITALS — BP 100/50 | HR 62 | Temp 98.2°F | Ht 63.0 in | Wt 142.3 lb

## 2016-02-27 DIAGNOSIS — C50412 Malignant neoplasm of upper-outer quadrant of left female breast: Secondary | ICD-10-CM

## 2016-02-27 DIAGNOSIS — I5042 Chronic combined systolic (congestive) and diastolic (congestive) heart failure: Secondary | ICD-10-CM

## 2016-02-27 DIAGNOSIS — I2510 Atherosclerotic heart disease of native coronary artery without angina pectoris: Secondary | ICD-10-CM | POA: Diagnosis not present

## 2016-02-27 DIAGNOSIS — N189 Chronic kidney disease, unspecified: Secondary | ICD-10-CM | POA: Diagnosis not present

## 2016-02-27 MED ORDER — HYDROCOD POLST-CPM POLST ER 10-8 MG/5ML PO SUER: 5.0000 mL | Freq: Every day | ORAL | Status: AC

## 2016-02-27 NOTE — Telephone Encounter (Signed)
Ok to refill x1. Would advised she discuss with hospice if further refills needed in case they are prescribing othe rmeds that could interact. Thanks.

## 2016-02-27 NOTE — Telephone Encounter (Signed)
Patient [facility] stopped her Crestor and Zetia because she is not eating. Instructed patient to discuss those medications with Hospice if she would like to restart.  Patient is also very upset because she "wants the clip out of her chest." Reminded the patient that [facility] placed the clip and she will have to consult with them for removal.  She [facility] would not let her get the clip removed.   Called [facility] for clarification. Patient had an abnormal mammography, a biopsy was done and a clip for marker was placed.  The patient blames the clip for her worsening symptoms. She was referred to Dr. Marlou Starks for surgery. Janett Billow, RN at [facility], st Dr. Marlou Starks will not do any procedure on the patient unless her health improves. He offered chemo and radiation in the meantime and the patient has been no-shows to her oncology appointments.  Patient requested a call tomorrow to follow-up on Crestor and Zetia. Will clarify "clip problem" at that time.

## 2016-02-27 NOTE — Telephone Encounter (Signed)
Penny James is calling because she knows that Hospice is out there to makes things easy , but she is not ready to die yet . And is wanting to be placed back on her Crestor and her Zeita. Please call   Thanks

## 2016-02-27 NOTE — Telephone Encounter (Signed)
Pt saw Dr Maudie Mercury this am for a hospital fup. Pt states the hospital dr put her on chlorpheniramine-HYDROcodone ([place]) 10-8 MG/5ML SURE   for her cough.  Pt states they advised her to get any refills from her pcp. Pt failed to mention to Dr Maudie Mercury this am.  Pt would like a refill of this cough medicine.

## 2016-02-27 NOTE — Patient Instructions (Addendum)
Please follow up with your oncologist regarding your questions about the clip and breast cancer.  Please follow up with your cardiologist about any heart medications or concerns.  Please notify her hospice team if you have any comfort care issues or questions.  Follow-up as needed and every 3 months.

## 2016-02-27 NOTE — Progress Notes (Signed)
Pre visit review using our clinic review tool, if applicable. No additional management support is needed unless otherwise documented below in the visit note. 

## 2016-02-27 NOTE — Telephone Encounter (Signed)
Printed Rx was called to the pts pharmacy and I called her and informed her of the message below.

## 2016-02-27 NOTE — Telephone Encounter (Signed)
Pharmacy called back and stated the Rx for Tussionex cannot be called in and the pt has to pick up a written Rx.  I called the pt and apologized and informed her of this and she stated she will pick up the Rx tomorrow and stated she needs the brand.  I advised her to inform the pharmacy of this when she takes the Rx in and she agreed.

## 2016-02-27 NOTE — Progress Notes (Signed)
HPI:  Penny James is a pleasant 65 yo, unfortunately with a very complicated PMH significant for severe Cs/dCHF, CAD s/p CABG, Cardiomyopathy, PAD, CKD, Diabetes, Bipolar disorder and Breast cancer, recently discharged from the hospital to hospice care. She is under the care of hospice and palliative care of [facility]. They have sent updates and she is getting nursing, SW and pastoral care at home. She was hospitalized 4/6-4/15/17 for  Acute on chronic CHF and per notes had NSTEMI, managed medically as declined further intervention given risks. During the hospitalization patient made the decision to transfer to hospice care. She is DNR. She confirms today that she is DO NOT RESUSCITATE and is under hospice care. She has a strong International aid/development worker. She reports she no she has breast cancer, and wonders how bad it is and wonders if she could surgically remove it without anesthesia. She is very concerned about a clip they placed in her breast and thinks that this is causing her to have nausea, metallic taste, cough and a low heart rate. She has an appointment with Dr. Sonny Dandy in 2 days to discuss this and breast cancer treatments and reports she wants this clip to be taken out and she has decided not to undergo any treatment for her breast cancer. Per friend and she has contacted [facility] in place clip was waiting for a call back from surgery. Denies chest pain, shortness of breath, vomiting or pain at this time. Her cardiologist continues to manage her heart medications. Hospice is managing her comfort care and she reports she has been very happy with their services.  ROS: See pertinent positives and negatives per HPI.  Past Medical History  Diagnosis Date  . Hyperlipidemia   . Arthritis   . Anemia   . Chronic combined systolic and diastolic CHF (congestive heart failure) ([place])     a. EF 55% by cath 2010 but in 09/2015 found to be 20%, with mod-severe MR, grade 2 DD.  . Bipolar affective disorder  ([place])   . GERD (gastroesophageal reflux disease)   . Obesity   . Peripheral neuropathy ([place])   . 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 CAD (coronary artery disease)     a. CABG in 2002. b. Cath 09/2015: poor targets, not a good surgical candidate, high risk PCI reserved for recurrent symptoms.  . Carotid artery stenosis     60-79% bilateral ICA stenosis s/p Right CEA 2013  followed by Dr. Donnetta Hutching  . Mitral regurgitation     a. Mod-severe by echo 09/2015.  . Ischemic cardiomyopathy   . CKD (chronic kidney disease), stage III   . Breast cancer ([place])   . Elevated troponin 02/06/2016  . Ventricular tachycardia ([place]) 02/10/2016    Past Surgical History  Procedure Laterality Date  . Abdominal hysterectomy  1984  . Sp pta renal / visc  artery  2003    with stent of left renal artery  . Tonsillectomy      AS A CHILD  . Endarterectomy  02/18/2012    rocedure: ENDARTERECTOMY CAROTID;  Surgeon: Rosetta Posner, MD;  Location: [facility] OR;  Service: Vascular;  Laterality: Right;  Right Carotid endarterectomy with Dacron patch angioplasty with resection of internal carotid artery  . Coronary artery bypass graft      3 vessel coronary disease S?P CABG with widely patent  grafts by cath in 2005 and distal disease past the graft insertion sites.  . Cardiac catheterization  03/25/09    Occluded SVG to intermediate and SVG to OM/posterior descending artery with PTCA left circ 6/10 s/p PTCA stent in SVG to OM/PDA, 07/2002 s/p PCI of left circ 03/2009  . Cardiac catheterization N/A 10/05/2015    Procedure: Left Heart Cath and Cors/Grafts Angiography;  Surgeon: Troy Sine, MD;  Location: [facility];  Service: Cardiovascular;  Laterality: N/A;    Family History  Problem Relation Age of Onset  . Heart disease Brother     Heart Disease before age 45  . Glaucoma  Father   . Hypertension Father   . Stroke Paternal Grandmother   . Coronary artery disease Paternal Grandmother   . Cerebral aneurysm Brother   . Clotting disorder Sister     Social History   Social History  . Marital Status: Married    Spouse Name: N/A  . Number of Children: 3  . Years of Education: N/A   Occupational History  . DISABLED    Social History Main Topics  . Smoking status: Never Smoker   . Smokeless tobacco: Never Used  . Alcohol Use: No  . Drug Use: No  . Sexual Activity: No   Other Topics Concern  . None   Social History Narrative     Current outpatient prescriptions:  .  amiodarone (PACERONE) 400 MG tablet, Take 1 tablet (400 mg total) by mouth daily., Disp: 30 tablet, Rfl: 6 .  aspirin EC 81 MG tablet, Take 81 mg by mouth daily., Disp: , Rfl:  .  BAYER MICROLET LANCETS lancets, Use as instructed, Disp: 100 each, Rfl: 3 .  carvedilol (COREG) 6.25 MG tablet, Take 1 tablet (6.25 mg total) by mouth 2 (two) times daily with a meal., Disp: 60 tablet, Rfl: 0 .  chlorpheniramine-HYDROcodone (TUSSIONEX) 10-8 MG/5ML SUER, Take 5 mLs by mouth at bedtime., Disp: 140 mL, Rfl: 0 .  furosemide (LASIX) 40 MG tablet, Take 1 tablet (40 mg total) by mouth daily. May take extra 40 mg tablet once daily as needed for weight 148 lbs or greater., Disp: 45 tablet, Rfl: 6 .  Glucose Blood (BAYER BREEZE 2 TEST) DISK, Test blood sugar four times a day, Disp: 200 each, Rfl: 5 .  hydrALAZINE (APRESOLINE) 10 MG tablet, Take 1 tablet (10 mg total) by mouth every 8 (eight) hours., Disp: 90 tablet, Rfl: 0 .  isosorbide mononitrate (IMDUR) 30 MG 24 hr tablet, Take 3 tablets (90 mg total) by mouth daily., Disp: 30 tablet, Rfl: 0 .  metoCLOPramide (REGLAN) 5 MG tablet, Take 1 tablet (5 mg total) by mouth every 6 (six) hours as needed for nausea (nausea/headache)., Disp: 20 tablet, Rfl: 0 .  nitroGLYCERIN (NITROSTAT) 0.4 MG SL tablet, Place 1 tablet (0.4 mg total) under the tongue every 5  (five) minutes as needed for chest pain., Disp: 25 tablet, Rfl: 2 .  olopatadine (PATANOL) 0.1 % ophthalmic solution, Place 1 drop into both eyes 2 (two) times daily as needed for allergies., Disp: , Rfl:  .  PLAVIX 75 MG tablet, TAKE 1 TABLET BY MOUTH EVERY DAY, Disp: 90 tablet, Rfl: 0  EXAM:  Filed Vitals:   02/27/16 0912  BP: 100/50  Pulse: 62  Temp: 98.2 F (36.8 C)    Body mass index is 25.21 kg/(m^2).  GENERAL: vitals reviewed and listed above, alert, oriented, appears well hydrated and in no acute distress  HEENT:  atraumatic, conjunttiva clear, no obvious abnormalities on inspection of external nose and ears  NECK: no obvious masses on inspection  LUNGS: clear to auscultation bilaterally, no wheezes, rales or rhonchi, good air movement  CV: HRRR,  SEM, no peripheral edema  MS: moves all extremities without noticeable abnormality  PSYCH: pleasant and cooperative, no obvious depression or anxiety  ASSESSMENT AND PLAN:  Discussed the following assessment and plan:  Atherosclerosis of native coronary artery of native heart without angina pectoris  Breast cancer of upper-outer quadrant of left female breast ([place])  CKD (chronic kidney disease), unspecified stage  Chronic combined systolic and diastolic congestive heart failure ([place])  -View briefly hospital course and recent interactions with her other specialist -Reviewed and confirmed that she desires hospice care and DNR-advised that I will defer to hospice team to manage her comfort care, any medications for comfort and changes in regimen related to comfort care status -she is in agreement -Advised that any heart medications or concerns regarding her heart being managed by her cardiologist -Advised that she discuss treatment options for her breast cancer with her oncologist and confirmed that she is aware of the diagnosis. I did advise that she discuss her concerns regarding the clip with her surgeon and oncologist  and she agreed. Discussed possibility that some symptoms may be coming from her chronic disease and medications, rather the the clip. -Patient advised to return or notify a doctor immediately if symptoms worsen or persist or new concerns arise.  Patient Instructions  Please follow up with your oncologist regarding your questions about the clip and breast cancer.  Please follow up with your cardiologist about any heart medications or concerns.  Please notify her hospice team if you have any comfort care issues or questions.  Follow-up as needed and every 3 months.      Colin Benton R.

## 2016-02-28 ENCOUNTER — Telehealth: Payer: Self-pay | Admitting: Family Medicine

## 2016-02-28 ENCOUNTER — Encounter (HOSPITAL_COMMUNITY): Payer: Self-pay | Admitting: Cardiology

## 2016-02-28 NOTE — Telephone Encounter (Signed)
Pt need new Rx pt is unable to take generic Tussinex pennic 10-8 mg/63ml due to it running her blood pressure up.

## 2016-02-28 NOTE — Telephone Encounter (Signed)
Called to follow-up with patient. Reiterated to patient that [facility] and Dr. Marlou Starks are the providers to speak to regarding removal of break clip. Patient was grateful for call.

## 2016-02-28 NOTE — Telephone Encounter (Signed)
Please let her know, for this medication, the generic version should be fine. If she prefers to pay extra for the brand name she can - please resend if needed or talk to pharmacy about pt preference. Also advise again, if continues to need this medication that she discuss with hospice.

## 2016-02-28 NOTE — Telephone Encounter (Signed)
Left message on machine for patient to return our call 

## 2016-02-29 ENCOUNTER — Encounter: Payer: Self-pay | Admitting: Hematology and Oncology

## 2016-02-29 ENCOUNTER — Ambulatory Visit (HOSPITAL_COMMUNITY): Payer: Medicare Other

## 2016-02-29 ENCOUNTER — Telehealth: Payer: Self-pay | Admitting: Cardiology

## 2016-02-29 ENCOUNTER — Ambulatory Visit (HOSPITAL_BASED_OUTPATIENT_CLINIC_OR_DEPARTMENT_OTHER): Payer: Medicare Other | Admitting: Hematology and Oncology

## 2016-02-29 VITALS — BP 125/47 | HR 64 | Temp 97.5°F | Resp 18 | Ht 63.0 in | Wt 144.1 lb

## 2016-02-29 DIAGNOSIS — C50412 Malignant neoplasm of upper-outer quadrant of left female breast: Secondary | ICD-10-CM | POA: Diagnosis not present

## 2016-02-29 DIAGNOSIS — I2510 Atherosclerotic heart disease of native coronary artery without angina pectoris: Secondary | ICD-10-CM | POA: Diagnosis not present

## 2016-02-29 DIAGNOSIS — I25708 Atherosclerosis of coronary artery bypass graft(s), unspecified, with other forms of angina pectoris: Secondary | ICD-10-CM

## 2016-02-29 NOTE — Assessment & Plan Note (Deleted)
Left breast biopsy 01/04/2016: Invasive ductal carcinoma with DCIS, grade 2, ER PR 0%, HER-2 negative ratio 1.32, Ki-67 80%, mammogram/ultrasound showed 1.7 cm tumor at 2:00 position, T1 cN0 stage IA clinical stage  Recommendation: 1. Neoadjuvant chemotherapy with Taxotere and Cytoxan every 3 weeks 4 cycles (avoiding Adriamycin because of CHF and CAD) 2. Follow-up with breast conserving surgery and sentinel lymph node biopsy 3. Follow-up adjuvant radiation therapy.  Our original plan was to do adjuvant chemotherapy but because of her cardiac issues with regards to Plavix which cannot be discontinued for surgery, we elected to do chemotherapy upfront. I discussed with the patient the benefits of chemotherapy tentatively equal with the treatments done before or after surgery. Dr.Tsui has requesting a port placement by interventional radiology.   Will start chemotherapy in one week

## 2016-02-29 NOTE — Telephone Encounter (Signed)
Penny James is calling because she is having really bad chest tightness

## 2016-02-29 NOTE — Telephone Encounter (Signed)
Instructed patient to go to ED if chest pain continues. Scheduled patient 5/5 with [facility] for evaluation per Dr. Radford Pax.

## 2016-02-29 NOTE — Progress Notes (Signed)
Patient Care Team: Lucretia Kern, DO as PCP - General (Family Medicine) Sueanne Margarita, MD as Consulting Physician (Cardiology) Larey Dresser, MD as Consulting Physician (Cardiology) Sylvan Cheese, NP as Nurse Practitioner (Hematology and Oncology)  DIAGNOSIS: Breast cancer of upper-outer quadrant of left female breast [facility])   Staging form: Breast, AJCC 7th Edition     Clinical stage from 01/11/2016: Stage IA (T1c, N0, M0) - Unsigned       Staging comments: Staged at breast conference on 3.15.17  SUMMARY OF ONCOLOGIC HISTORY:   Breast cancer of upper-outer quadrant of left female breast ([place])   01/04/2016 Initial Diagnosis Left breast biopsy: Invasive ductal carcinoma with DCIS, grade 2, ER PR 0%, HER-2 negative ratio 1.32, Ki-67 80%, mammogram/ultrasound showed 1.7 cm tumor at 2:00 position, T1 cN0 stage IA clinical stage    CHIEF COMPLIANT: follow-up to discuss treatment options for breast cancer, negative disease  INTERVAL HISTORY: Penny James is a 65 year old with above-mentioned history of 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 We have discussed and considered doing neoadjuvant chemotherapy instead but even for that she would need a port placement and port cannot be placed because she cannot go under anesthesia and her current cardiac state. Patient does Dr. Radford Pax her cardiologist as informed her that she cannot receive any chemotherapy even if given through a peripheral vein because of cardiac risks. Patient is extremely fixated on a titanium clip that is present in the breast and thinks that if that can be removed her cardiac issues would improve significantly.  REVIEW OF SYSTEMS:   Constitutional: Denies fevers, chills or abnormal weight loss Eyes: Denies blurriness of vision Ears, nose, mouth, throat, and face: Denies  mucositis or sore throat Respiratory: chronic shortness of breath uses oxygen at home Cardiovascular: Denies palpitation, chest discomfort Gastrointestinal:  Denies nausea, heartburn or change in bowel habits Skin: Denies abnormal skin rashes Lymphatics: Denies new lymphadenopathy or easy bruising Neurological:Denies numbness, tingling or new weaknesses Behavioral/Psych: Mood is stable, no new changes  Extremities: No lower extremity edema Breast: left breast lump All other systems were reviewed with the patient and are negative.  I have reviewed the past medical history, past surgical history, social history and family history with the patient and they are unchanged from previous note.  ALLERGIES:  is allergic to penicillins; protamine; and amlodipine.  MEDICATIONS:  Current Outpatient Prescriptions  Medication Sig Dispense Refill  . amiodarone (PACERONE) 400 MG tablet Take 1 tablet (400 mg total) by mouth daily. 30 tablet 6  . aspirin EC 81 MG tablet Take 81 mg by mouth daily.    [name] BAYER MICROLET LANCETS lancets Use as instructed 100 each 3  . carvedilol (COREG) 6.25 MG tablet Take 1 tablet (6.25 mg total) by mouth 2 (two) times daily with a meal. 60 tablet 0  . chlorpheniramine-HYDROcodone (TUSSIONEX) 10-8 MG/5ML SUER Take 5 mLs by mouth at bedtime. 140 mL 0  . furosemide (LASIX) 40 MG tablet Take 1 tablet (40 mg total) by mouth daily. May take extra 40 mg tablet once daily as needed for weight 148 lbs or greater. 45 tablet 6  . Glucose Blood (BAYER BREEZE 2 TEST) DISK Test blood sugar four times a day 200 each 5  . hydrALAZINE (APRESOLINE) 10 MG tablet Take 1 tablet (10 mg total) by mouth every 8 (eight)  hours. 90 tablet 0  . isosorbide mononitrate (IMDUR) 30 MG 24 hr tablet Take 3 tablets (90 mg total) by mouth daily. 30 tablet 0  . metoCLOPramide (REGLAN) 5 MG tablet Take 1 tablet (5 mg total) by mouth every 6 (six) hours as needed for nausea (nausea/headache). 20 tablet 0  .  nitroGLYCERIN (NITROSTAT) 0.4 MG SL tablet Place 1 tablet (0.4 mg total) under the tongue every 5 (five) minutes as needed for chest pain. 25 tablet 2  . olopatadine (PATANOL) 0.1 % ophthalmic solution Place 1 drop into both eyes 2 (two) times daily as needed for allergies.    [name] PLAVIX 75 MG tablet TAKE 1 TABLET BY MOUTH EVERY DAY 90 tablet 0   No current facility-administered medications for this visit.    PHYSICAL EXAMINATION: ECOG PERFORMANCE STATUS: 2 - Symptomatic, <50% confined to bed  Filed Vitals:   02/29/16 1212  BP: 125/47  Pulse: 64  Temp: 97.5 F (36.4 C)  Resp: 18   Filed Weights   02/29/16 1212  Weight: 144 lb 1.6 oz (65.363 kg)    GENERAL:alert, no distress and comfortable SKIN: skin color, texture, turgor are normal, no rashes or significant lesions EYES: normal, Conjunctiva are pink and non-injected, sclera clear OROPHARYNX:no exudate, no erythema and lips, buccal mucosa, and tongue normal  NECK: supple, thyroid normal size, non-tender, without nodularity LYMPH:  no palpable lymphadenopathy in the cervical, axillary or inguinal LUNGS: clear to auscultation and percussion with normal breathing effort HEART: pansystolic murmur ABDOMEN:abdomen soft, non-tender and normal bowel sounds MUSCULOSKELETAL:no cyanosis of digits and no clubbing  NEURO: alert & oriented x 3 with fluent speech, no focal motor/sensory deficits EXTREMITIES: 1+ lower extremity edema BREAST:left breast lump 3 x 3 cm to palpation movable nontender. No palpable axillary supraclavicular or infraclavicular adenopathy no breast tenderness or nipple discharge. (exam performed in the presence of a chaperone)  LABORATORY DATA:  I have reviewed the data as listed   Chemistry      Component Value Date/Time   NA 132* 02/23/2016 2020   NA 134* 01/11/2016 0911   K 3.4* 02/23/2016 2020   K 4.2 01/11/2016 0911   CL 95* 02/23/2016 2020   CO2 24 02/23/2016 2020   CO2 29 01/11/2016 0911   BUN 20  02/23/2016 2020   BUN 20.8 01/11/2016 0911   CREATININE 1.85* 02/23/2016 2020   CREATININE 1.3* 01/11/2016 0911   CREATININE 1.16* 01/06/2016 1326      Component Value Date/Time   CALCIUM 9.3 02/23/2016 2020   CALCIUM 9.1 01/11/2016 0911   ALKPHOS 49 02/23/2016 2020   ALKPHOS 58 01/11/2016 0911   AST 22 02/23/2016 2020   AST 25 01/11/2016 0911   ALT 15 02/23/2016 2020   ALT 25 01/11/2016 0911   BILITOT 0.7 02/23/2016 2020   BILITOT 0.54 01/11/2016 0911       Lab Results  Component Value Date   WBC 3.5* 02/23/2016   HGB 10.0* 02/23/2016   HCT 30.4* 02/23/2016   MCV 81.1 02/23/2016   PLT 214 02/23/2016   NEUTROABS 2.0 01/11/2016     ASSESSMENT & PLAN:  Breast cancer of upper-outer quadrant of left female breast ([place]) Left breast biopsy 01/04/2016: Invasive ductal carcinoma with DCIS, grade 2, ER PR 0%, HER-2 negative ratio 1.32, Ki-67 80%, mammogram/ultrasound showed 1.7 cm tumor at 2:00 position, T1 cN0 stage IA clinical stage  Because of patient cannot undergo surgery given the fact that she is very high risk from cardiac standpoint, we  discussed the role of neoadjuvant chemotherapy. However even to put a port she cannot go under anesthesia and and patient tells me that Dr. Radford Pax does not even want [place] to give chemotherapy with a peripheral vein. At this point to not do any such interventions. So we will hold off on doing further plans for neoadjuvant chemotherapy.  Plan: 1. Patient to undergo surgery when Dr. Radford Pax believes that she is capable of undergoing lumpectomy. 2. Return to clinic afterwards to discuss the final pathology report and to decide on adjuvant treatment plan which ideally will need chemotherapy. 3. Followed by radiation  I will discuss with Dr. Radford Pax her cardiologist and Dr. Georgette Dover Appointments will not be made at this time. I would like to see her back after surgery.    No orders of the defined types were placed in this encounter.   The patient  has a good understanding of the overall plan. she agrees with it. she will call with any problems that may develop before the next visit here.   Rulon Eisenmenger, MD 02/29/2016

## 2016-02-29 NOTE — Assessment & Plan Note (Signed)
Left breast biopsy 01/04/2016: Invasive ductal carcinoma with DCIS, grade 2, ER PR 0%, HER-2 negative ratio 1.32, Ki-67 80%, mammogram/ultrasound showed 1.7 cm tumor at 2:00 position, T1 cN0 stage IA clinical stage  Recommendation: 1. Neoadjuvant chemotherapy with Taxotere and Cytoxan every 3 weeks 4 cycles (avoiding Adriamycin because of CHF and CAD) 2. Follow-up with breast conserving surgery and sentinel lymph node biopsy 3. Follow-up adjuvant radiation therapy.  Our original plan was to do adjuvant chemotherapy but because of her cardiac issues with regards to Plavix which cannot be discontinued for surgery, we elected to do chemotherapy upfront. I discussed with the patient the benefits of chemotherapy tentatively equal with the treatments done before or after surgery. Dr.Tsui has requesting a port placement by interventional radiology.   Will start chemotherapy in one week

## 2016-02-29 NOTE — Telephone Encounter (Signed)
Needs to go to ER if she is continuing to have CP.  Otherwise see extender in office tomorrow

## 2016-02-29 NOTE — Telephone Encounter (Signed)
Call transferred into triage patient is reporting chest tightness.  Patient states that she has had chest tightness for about 2 weeks. She has taken 2 SL nitro which have not been effective. She reports a cough productive for white sputum x 3 day.  Nothing makes the tightness worse. She denies swelling.  No other reported symptoms currently. She said when she goes outside and breathes fresh air the cough is better and the tightness improves then.  She states "I have that clip in my breast and I think that is the reason for the chest tightness.  The two other times they put a clip in I had low heart rate and fluid build up"  Verified with patient she has had a breast marker twice in the past and it was removed both times."    Patient then states that she has to leave for an appointment with Dr. Lindi Adie  (its at 11:45 am) and could I call her back. Advised I would be sending a message to Dr. Radford Pax and her nurse and either triage or Dr. Theodosia Blender nurse will call her back.

## 2016-03-01 ENCOUNTER — Other Ambulatory Visit: Payer: Self-pay | Admitting: Cardiology

## 2016-03-01 ENCOUNTER — Telehealth: Payer: Self-pay | Admitting: Cardiology

## 2016-03-01 NOTE — Telephone Encounter (Signed)
I called the pt and informed her of the message below and she stated she cannot take the generic version.  I called [facility] and spoke with Ebony Hail the pharmacist and informed her of this and asked what should the pt do to get the brand.  She stated the pt needs to ask for the brand but looks like she talked with another pharmacist Shirlean Mylar who has ordered the brand and it should be in this afternoon.  I called the pt and informed her of this and to be sure to pick up the Rx which is at the front desk to take to the pharmacy and she agreed.

## 2016-03-02 ENCOUNTER — Ambulatory Visit (HOSPITAL_COMMUNITY): Payer: Medicare Other

## 2016-03-02 ENCOUNTER — Encounter: Payer: Self-pay | Admitting: Physician Assistant

## 2016-03-02 ENCOUNTER — Ambulatory Visit (INDEPENDENT_AMBULATORY_CARE_PROVIDER_SITE_OTHER): Payer: Medicare Other | Admitting: Physician Assistant

## 2016-03-02 ENCOUNTER — Telehealth: Payer: Self-pay | Admitting: Family Medicine

## 2016-03-02 VITALS — BP 112/60 | HR 64 | Ht 63.0 in | Wt 142.4 lb

## 2016-03-02 DIAGNOSIS — N189 Chronic kidney disease, unspecified: Secondary | ICD-10-CM

## 2016-03-02 DIAGNOSIS — I255 Ischemic cardiomyopathy: Secondary | ICD-10-CM

## 2016-03-02 DIAGNOSIS — I5042 Chronic combined systolic (congestive) and diastolic (congestive) heart failure: Secondary | ICD-10-CM | POA: Diagnosis not present

## 2016-03-02 DIAGNOSIS — I2510 Atherosclerotic heart disease of native coronary artery without angina pectoris: Secondary | ICD-10-CM | POA: Diagnosis not present

## 2016-03-02 DIAGNOSIS — C50412 Malignant neoplasm of upper-outer quadrant of left female breast: Secondary | ICD-10-CM

## 2016-03-02 DIAGNOSIS — I6523 Occlusion and stenosis of bilateral carotid arteries: Secondary | ICD-10-CM

## 2016-03-02 DIAGNOSIS — I10 Essential (primary) hypertension: Secondary | ICD-10-CM

## 2016-03-02 DIAGNOSIS — I340 Nonrheumatic mitral (valve) insufficiency: Secondary | ICD-10-CM

## 2016-03-02 LAB — BASIC METABOLIC PANEL
BUN: 26 mg/dL — AB (ref 7–25)
CALCIUM: 9 mg/dL (ref 8.6–10.4)
CO2: 29 mmol/L (ref 20–31)
Chloride: 93 mmol/L — ABNORMAL LOW (ref 98–110)
Creat: 1.66 mg/dL — ABNORMAL HIGH (ref 0.50–0.99)
Glucose, Bld: 95 mg/dL (ref 65–99)
Potassium: 3.7 mmol/L (ref 3.5–5.3)
Sodium: 132 mmol/L — ABNORMAL LOW (ref 135–146)

## 2016-03-02 MED ORDER — AMIODARONE HCL 200 MG PO TABS: 200.0000 mg | ORAL_TABLET | Freq: Every day | ORAL | Status: AC

## 2016-03-02 MED ORDER — PREVACID 30 MG PO CPDR: 30.0000 mg | DELAYED_RELEASE_CAPSULE | Freq: Every day | ORAL | Status: DC

## 2016-03-02 NOTE — Telephone Encounter (Signed)
Pt would like dr Maudie Mercury to return her call concerning titanium clip in her left breast. Pt would like to have it removed and per pt dr Maudie Mercury has to give [employer] permission to remove it.

## 2016-03-02 NOTE — Progress Notes (Signed)
Cardiology Office Note   Date:  03/02/2016   ID:  Penny James, DOB 1951-10-28, MRN [id number]  PCP:  Lucretia Kern., DO  Cardiologist:  Dr Julaine Fusi, PA-C   No chief complaint on file.   History of Present Illness: Penny James is a 65 y.o. female with a history of CAD s/p CABG, HTN, diabetes, ischemic cardiomyopathy, combined systolic and diastolic CHF, moderate to severe mitral regurgitation, PAD status post prior renal artery stenting, HL, carotid stenosis status post R CEA, CKD, bipolar affective d/o.   D/c 04/15 after admit for D-CHF, VDRF req intubation, NSVT now on amio.  Currently in Hospice due to breast CA, chemotherapy ordered.   Seen in office 04/27 and was cardiac stable.  Called office complaining of chest pain, appt scheduled.  Penny James presents for evaluation of chest pain.  She gets chest pain on a daily basis. She gets up in the morning feeling well, we'll take her medications, and then get chest discomfort. It resolves by lunchtime. After this chest pain resolves, she can be as active as she wishes and will not get chest pain. Her shortness of breath is at baseline and she feels that her activity level is normal for her. She feels her weight is stable and denies significant orthopnea or PND. She is not currently on oxygen.  She has been on medication for reflux in the past, and wishes to start again but states she cannot take generic medications. She requests Prevacid.  She reiterates that she wishes to be a DO NOT RESUSCITATE and does not want CPR or intubation. However, she feels that she should not be followed by hospice because hospice helps people die.  She states that she will not be undergoing chemotherapy for breast cancer. She does not feel she has breast cancer. She feels the titanium clip is causing most of her problems.   Past Medical History  Diagnosis Date  . Hyperlipidemia   . Arthritis   . Anemia   .  Chronic combined systolic and diastolic CHF (congestive heart failure) ([place])     a. EF 55% by cath 2010 but in 09/2015 found to be 20%, with mod-severe MR, grade 2 DD.  . Bipolar affective disorder ([place])   . GERD (gastroesophageal reflux disease)   . Obesity   . Peripheral neuropathy ([place])   . 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 CAD (coronary artery disease)     a. CABG in 2002. b. Cath 09/2015: poor targets, not a good surgical candidate, high risk PCI reserved for recurrent symptoms.  . Carotid artery stenosis     60-79% bilateral ICA stenosis s/p Right CEA 2013  followed by Dr. Donnetta Hutching  . Mitral regurgitation     a. Mod-severe by echo 09/2015.  . Ischemic cardiomyopathy   . CKD (chronic kidney disease), stage III   . Breast cancer ([place])   . Elevated troponin 02/06/2016  . Ventricular tachycardia ([place]) 02/10/2016    Past Surgical History  Procedure Laterality Date  . Abdominal hysterectomy  1984  . Sp pta renal / visc  artery  2003    with stent of left renal artery  . Tonsillectomy      AS A CHILD  . Endarterectomy  02/18/2012    rocedure: ENDARTERECTOMY CAROTID;  Surgeon: Sherren Mocha  Katina Dung, MD;  Location: [facility];  Service: Vascular;  Laterality: Right;  Right Carotid endarterectomy with Dacron patch angioplasty with resection of internal carotid artery  . Coronary artery bypass graft      3 vessel coronary disease S?P CABG with widely patent grafts by cath in 2005 and distal disease past the graft insertion sites.  . Cardiac catheterization  03/25/09    Occluded SVG to intermediate and SVG to OM/posterior descending artery with PTCA left circ 6/10 s/p PTCA stent in SVG to OM/PDA, 07/2002 s/p PCI of left circ 03/2009  . Cardiac catheterization N/A 10/05/2015    Procedure: Left Heart Cath and Cors/Grafts Angiography;  Surgeon: Troy Sine, MD;   Location: [facility];  Service: Cardiovascular;  Laterality: N/A;    Current Outpatient Prescriptions  Medication Sig Dispense Refill  . amiodarone (PACERONE) 400 MG tablet Take 1 tablet (400 mg total) by mouth daily. 30 tablet 6  . aspirin EC 81 MG tablet Take 81 mg by mouth daily.    [name] BAYER MICROLET LANCETS lancets Use as instructed 100 each 3  . carvedilol (COREG) 6.25 MG tablet Take 1 tablet (6.25 mg total) by mouth 2 (two) times daily with a meal. 60 tablet 0  . chlorpheniramine-HYDROcodone (TUSSIONEX) 10-8 MG/5ML SUER Take 5 mLs by mouth at bedtime. 140 mL 0  . furosemide (LASIX) 40 MG tablet Take 1 tablet (40 mg total) by mouth daily. May take extra 40 mg tablet once daily as needed for weight 148 lbs or greater. 45 tablet 6  . Glucose Blood (BAYER BREEZE 2 TEST) DISK Test blood sugar four times a day 200 each 5  . hydrALAZINE (APRESOLINE) 10 MG tablet Take 1 tablet (10 mg total) by mouth every 8 (eight) hours. 90 tablet 0  . isosorbide mononitrate (IMDUR) 30 MG 24 hr tablet Take 3 tablets (90 mg total) by mouth daily. 30 tablet 0  . metoCLOPramide (REGLAN) 5 MG tablet Take 1 tablet (5 mg total) by mouth every 6 (six) hours as needed for nausea (nausea/headache). 20 tablet 0  . nitroGLYCERIN (NITROSTAT) 0.4 MG SL tablet Place 1 tablet (0.4 mg total) under the tongue every 5 (five) minutes as needed for chest pain. 25 tablet 2  . olopatadine (PATANOL) 0.1 % ophthalmic solution Place 1 drop into both eyes 2 (two) times daily as needed for allergies.    [name] PLAVIX 75 MG tablet TAKE 1 TABLET BY MOUTH EVERY DAY 90 tablet 0   No current facility-administered medications for this visit.    Allergies:   Penicillins; Protamine; and Amlodipine    Social History:  The patient  reports that she has never smoked. She has never used smokeless tobacco. She reports that she does not drink alcohol or use illicit drugs.   Family History:  The patient's family history includes Cerebral aneurysm  in her brother; Clotting disorder in her sister; Coronary artery disease in her paternal grandmother; Glaucoma in her father; Heart disease in her brother; Hypertension in her father; Stroke in her father and paternal grandmother. There is no history of Heart attack.    ROS:  Please see the history of present illness. All other systems are reviewed and negative.    PHYSICAL EXAM: VS:  BP 112/60 mmHg  Pulse 64  Ht 5\' 3"  (1.6 m)  Wt 142 lb 6.4 oz (64.592 kg)  BMI 25.23 kg/m2 , BMI Body mass index is 25.23 kg/(m^2). GEN: Well nourished, well developed, female in no acute  distress HEENT: normal for age  Neck: no JVD, negative hepatojugular reflux, right greater than left carotid bruits, no masses Cardiac: RRR; 2/6 murmur, no rubs, or gallops Respiratory: Decreased breath sounds bases with a few rales bilaterally, normal work of breathing GI: soft, nontender, nondistended, + BS MS: no deformity or atrophy; trace pedal edema; distal pulses are 2+ in all 4 extremities Skin: warm and dry, no rash Neuro:  Strength and sensation are intact Psych: euthymic mood, full affect   EKG:  EKG is ordered today. The ekg ordered today demonstrates sinus rhythm, first-degree AV block and left bundle branch block which are both old Heart rate 65   Recent Labs: 04/01/2015: Pro B Natriuretic peptide (BNP) 460.0* 02/02/2016: B Natriuretic Peptide 1635.8* 02/08/2016: Magnesium 2.3 02/09/2016: TSH 2.491 02/23/2016: ALT 15; BUN 20; Creatinine, Ser 1.85*; Hemoglobin 10.0*; Platelets 214; Potassium 3.4*; Sodium 132*    Lipid Panel    Component Value Date/Time   CHOL 108 12/09/2015 1229   TRIG 52.0 12/09/2015 1229   HDL 34.50* 12/09/2015 1229   CHOLHDL 3 12/09/2015 1229   VLDL 10.4 12/09/2015 1229   LDLCALC 63 12/09/2015 1229     Wt Readings from Last 3 Encounters:  03/02/16 142 lb 6.4 oz (64.592 kg)  02/29/16 144 lb 1.6 oz (65.363 kg)  02/27/16 142 lb 4.8 oz (64.547 kg)     Other studies  Reviewed: Additional studies/ records that were reviewed today include: Office notes, hospital records and testing.  ASSESSMENT AND PLAN:  1.  Chronic combined systolic and diastolic CHF: Her weight is down 2 pounds but generally stable. She feels that she is doing well with a low sodium diet and not over drinking. She is encouraged to continue her current medical therapy.  2. Chest pain: She is not having any exertional symptoms. Her symptoms occur after she takes her morning medications and resolve before lunch time. They do not recur.  Her symptoms may be secondary to reflux. She has taken Prevacid in the past (name brand only) and wishes to start this again. We will order it. We will change her aspirin and Imdur to lunch time. She is encouraged to take all morning medications with food  3. CAD: . If she develops exertional symptoms or continues to have problems, consideration can be given to increasing her Imdur. She has not tolerated Ranexa in the past, or amlodipine. She also did not wish to do high risk PCI and therefore medical therapy is the only option. She is on good medical therapy with aspirin, Plavix, carvedilol, Imdur. Her statin was stopped by Hospice.  4. Ventricular tachycardia: She had it in the hospital in the setting of respiratory failure. She has not had any palpitations or presyncope. She has been on the amiodarone for a month now. We will decrease the dose to 200 mg daily.  5. Hypokalemia: Recheck a BMET today.  Current medicines are reviewed at length with the patient today.  The patient does not have concerns regarding medicines.  The following changes have been made:  Add Prevacid, decrease amiodarone  Labs/ tests ordered today include:   Orders Placed This Encounter  Procedures  . EKG 12-Lead     Disposition:   FU with Dr. Radford Pax  Signed, Rosaria Ferries, PA-C  03/02/2016 12:07 PM    [facility] Phone: [phone]; Fax: [phone]  This note was written with the assistance of speech recognition software. Please excuse any transcriptional errors.

## 2016-03-02 NOTE — Patient Instructions (Signed)
Medication Instructions:  Your physician has recommended you make the following change in your medication:  1.  Take your Aspirin & Imdur at LUNCH DAILY 2.  DECREASE the Amiodarone 200 mg taking 1 tablet daily 3.  TAKE ALL OF YOUR MEDICATIONS WITH FOOD   Labwork: TODAY:  BMET  Testing/Procedures: None ordered  Follow-Up: Your physician recommends that you schedule a follow-up appointment in: 3 MONTHS WITH DR. Radford Pax   Any Other Special Instructions Will Be Listed Below (If Applicable). Your physician recommends that you weigh, daily, at the same time every day, and in the same amount of clothing. Please record your daily weights on the handout provided and bring it to your next appointment.   Low-Sodium Eating Plan Sodium raises blood pressure and causes water to be held in the body. Getting less sodium from food will help lower your blood pressure, reduce any swelling, and protect your heart, liver, and kidneys. We get sodium by adding salt (sodium chloride) to food. Most of our sodium comes from canned, boxed, and frozen foods. Restaurant foods, fast foods, and pizza are also very high in sodium. Even if you take medicine to lower your blood pressure or to reduce fluid in your body, getting less sodium from your food is important. WHAT IS MY PLAN? Most people should limit their sodium intake to 2,300 mg a day. Your health care provider recommends that you limit your sodium intake to 2 GRAMS a day.  WHAT DO I NEED TO KNOW ABOUT THIS EATING PLAN? For the low-sodium eating plan, you will follow these general guidelines:  Choose foods with a % Daily Value for sodium of less than 5% (as listed on the food label).   Use salt-free seasonings or herbs instead of table salt or sea salt.   Check with your health care provider or pharmacist before using salt substitutes.   Eat fresh foods.  Eat more vegetables and fruits.  Limit canned vegetables. If you do use them, rinse them well  to decrease the sodium.   Limit cheese to 1 oz (28 g) per day.   Eat lower-sodium products, often labeled as "lower sodium" or "no salt added."  Avoid foods that contain monosodium glutamate (MSG). MSG is sometimes added to [place] food and some canned foods.  Check food labels (Nutrition Facts labels) on foods to learn how much sodium is in one serving.  Eat more home-cooked food and less restaurant, buffet, and fast food.  When eating at a restaurant, ask that your food be prepared with less salt, or no salt if possible.  HOW DO I READ FOOD LABELS FOR SODIUM INFORMATION? The Nutrition Facts label lists the amount of sodium in one serving of the food. If you eat more than one serving, you must multiply the listed amount of sodium by the number of servings. Food labels may also identify foods as:  Sodium free--Less than 5 mg in a serving.  Very low sodium--35 mg or less in a serving.  Low sodium--140 mg or less in a serving.  Light in sodium--50% less sodium in a serving. For example, if a food that usually has 300 mg of sodium is changed to become light in sodium, it will have 150 mg of sodium.  Reduced sodium--25% less sodium in a serving. For example, if a food that usually has 400 mg of sodium is changed to reduced sodium, it will have 300 mg of sodium. WHAT FOODS CAN I EAT? Grains Low-sodium cereals, including oats, puffed  wheat and rice, and shredded wheat cereals. Low-sodium crackers. Unsalted rice and pasta. Lower-sodium bread.  Vegetables Frozen or fresh vegetables. Low-sodium or reduced-sodium canned vegetables. Low-sodium or reduced-sodium tomato sauce and paste. Low-sodium or reduced-sodium tomato and vegetable juices.  Fruits Fresh, frozen, and canned fruit. Fruit juice.  Meat and Other Protein Products Low-sodium canned tuna and salmon. Fresh or frozen meat, poultry, seafood, and fish. Lamb. Unsalted nuts. Dried beans, peas, and lentils without added salt.  Unsalted canned beans. Homemade soups without salt. Eggs.  Dairy Milk. Soy milk. Ricotta cheese. Low-sodium or reduced-sodium cheeses. Yogurt.  Condiments Fresh and dried herbs and spices. Salt-free seasonings. Onion and garlic powders. Low-sodium varieties of mustard and ketchup. Fresh or refrigerated horseradish. Lemon juice.  Fats and Oils Reduced-sodium salad dressings. Unsalted butter.  Other Unsalted popcorn and pretzels.  The items listed above may not be a complete list of recommended foods or beverages. Contact your dietitian for more options. WHAT FOODS ARE NOT RECOMMENDED? Grains Instant hot cereals. Bread stuffing, pancake, and biscuit mixes. Croutons. Seasoned rice or pasta mixes. Noodle soup cups. Boxed or frozen macaroni and cheese. Self-rising flour. Regular salted crackers. Vegetables Regular canned vegetables. Regular canned tomato sauce and paste. Regular tomato and vegetable juices. Frozen vegetables in sauces. Salted [place] fries. Olives. Angie Fava. Relishes. Sauerkraut. Salsa. Meat and Other Protein Products Salted, canned, smoked, spiced, or pickled meats, seafood, or fish. Bacon, ham, sausage, hot dogs, corned beef, chipped beef, and packaged luncheon meats. Salt pork. Jerky. Pickled herring. Anchovies, regular canned tuna, and sardines. Salted nuts. Dairy Processed cheese and cheese spreads. Cheese curds. Blue cheese and cottage cheese. Buttermilk.  Condiments Onion and garlic salt, seasoned salt, table salt, and sea salt. Canned and packaged gravies. Worcestershire sauce. Tartar sauce. Barbecue sauce. Teriyaki sauce. Soy sauce, including reduced sodium. Steak sauce. Fish sauce. Oyster sauce. Cocktail sauce. Horseradish that you find on the shelf. Regular ketchup and mustard. Meat flavorings and tenderizers. Bouillon cubes. Hot sauce. Tabasco sauce. Marinades. Taco seasonings. Relishes. Fats and Oils Regular salad dressings. Salted butter. Margarine. Ghee.  Bacon fat.  Other Potato and tortilla chips. Corn chips and puffs. Salted popcorn and pretzels. Canned or dried soups. Pizza. Frozen entrees and pot pies.  The items listed above may not be a complete list of foods and beverages to avoid. Contact your dietitian for more information.   This information is not intended to replace advice given to you by your health care provider. Make sure you discuss any questions you have with your health care provider.   Document Released: 04/06/2002 Document Revised: 11/05/2014 Document Reviewed: 08/19/2013 Elsevier Interactive Patient Education [employer].     If you need a refill on your cardiac medications before your next appointment, please call your pharmacy.

## 2016-03-02 NOTE — Telephone Encounter (Signed)
I called the pt and informed her of the message below and advised her I called [employer] and per Boykin Reaper has helped the pt with this and is aware of her case and she is out today.  Juliann Pulse stated she will leave a message for Janett Billow to call me back on Monday and the pt stated her attorney looked online and saw where these clips can cause problems and should be removed, stated she hopes she makes it through the weekend and if someone does not contact her Monday she will contact her attorney and have them force [employer] to remove the clip.  I again advised her of the message below and we will contact her on Monday when I hear from [place].

## 2016-03-02 NOTE — Telephone Encounter (Addendum)
Please let patient know, I did not place this clip and have no way of removing it. I am not sure why anyone would need my approval to remove it. I am sorry, that I am not a surgeon and am not able to help her with this. We can contact [employer] for her to let them know that she wishes to have it removed if she would like? I am not sure of the risks involved or options for removal, but perhaps they can help her? But, I thought she had contacted them?

## 2016-03-05 ENCOUNTER — Ambulatory Visit (HOSPITAL_COMMUNITY): Payer: Medicare Other

## 2016-03-05 ENCOUNTER — Telehealth: Payer: Self-pay | Admitting: *Deleted

## 2016-03-05 ENCOUNTER — Telehealth: Payer: Self-pay

## 2016-03-05 MED ORDER — PROTONIX 40 MG PO TBEC: 40.0000 mg | DELAYED_RELEASE_TABLET | Freq: Every day | ORAL | Status: DC

## 2016-03-05 NOTE — Telephone Encounter (Signed)
Ptcb and has been notified of lab results by phone with verbal understanding. 

## 2016-03-05 NOTE — Telephone Encounter (Signed)
-----   Message from Lonn Georgia, PA-C sent at 03/05/2016  2:33 PM EDT ----- Enid Cutter, I just realized Ms Kram was given rx for Prevacid and she is on Plavix.   She has to switch to Protonix. Can you add that med and call her? I am so sorry, I never use Prevacid and did not realize it was the wrong drug till today.  I will be in the office tomorrow.  Thanks [employer]

## 2016-03-05 NOTE — Telephone Encounter (Signed)
Patient called back and I informed her of the message below and gave her the phone number to contact Dr Marlou Starks.

## 2016-03-05 NOTE — Telephone Encounter (Signed)
I called [employer] and spoke with Lamount Cohen. (tech) and informed her of the message below and she stated she has spoke with the pt and informed her they cannot remove the clip as the surgeon would do this and has explained this to the pt a number of times and she should contact her surgeon Dr Marlou Starks at ph#[phone].  I left a message for the pt to return my call.

## 2016-03-05 NOTE — Telephone Encounter (Signed)
DPR ok to leave detailed message on machine. If any questions cb [phone].

## 2016-03-05 NOTE — Telephone Encounter (Signed)
Called patient. Notified her that the Prevacid and Plavix do not work well together and that we needed to change her acid reflux medication. Patient verbalized understanding and agreed to this. Called [facility], PA for strength (Protonix 40 mg) and sent that prescription to the pharmacy. Notified patient that she will receive a call from the pharmacy when prescription is ready for pick up.

## 2016-03-06 NOTE — Telephone Encounter (Signed)
Patient is concerned about her HR. When she checked it earlier today, her HR was 51. She was asymptomatic at the time. Instructed patient to check HR prior to taking medications and to hold Coreg if HR less than 60.  Informed her that her HR is usually around 60, but to call if HR stays below 50 bpm.  Patient agrees with treatment plan. She understands will be called if Dr. Radford Pax has further recommendations.

## 2016-03-06 NOTE — Telephone Encounter (Signed)
New Message:  Pt is calling in stating that since her medications have been adjusted, her HR has failed to exceed 51 bpm. She is concerned and would like to be advise to what to do. Please f/u with her.

## 2016-03-07 ENCOUNTER — Ambulatory Visit (HOSPITAL_COMMUNITY): Payer: Medicare Other

## 2016-03-08 ENCOUNTER — Ambulatory Visit (HOSPITAL_COMMUNITY)
Admission: RE | Admit: 2016-03-08 | Discharge: 2016-03-08 | Disposition: A | Discharge status: Home / Self Care | Payer: Medicare Other | Source: Ambulatory Visit | Attending: Internal Medicine | Admitting: Internal Medicine

## 2016-03-08 VITALS — BP 118/58 | HR 68 | Wt 144.4 lb

## 2016-03-08 DIAGNOSIS — M1990 Unspecified osteoarthritis, unspecified site: Secondary | ICD-10-CM | POA: Insufficient documentation

## 2016-03-08 DIAGNOSIS — E11319 Type 2 diabetes mellitus with unspecified diabetic retinopathy without macular edema: Secondary | ICD-10-CM | POA: Insufficient documentation

## 2016-03-08 DIAGNOSIS — Z7982 Long term (current) use of aspirin: Secondary | ICD-10-CM | POA: Insufficient documentation

## 2016-03-08 DIAGNOSIS — N183 Chronic kidney disease, stage 3 (moderate): Secondary | ICD-10-CM | POA: Insufficient documentation

## 2016-03-08 DIAGNOSIS — I5042 Chronic combined systolic (congestive) and diastolic (congestive) heart failure: Secondary | ICD-10-CM | POA: Diagnosis not present

## 2016-03-08 DIAGNOSIS — E1142 Type 2 diabetes mellitus with diabetic polyneuropathy: Secondary | ICD-10-CM | POA: Diagnosis not present

## 2016-03-08 DIAGNOSIS — Z951 Presence of aortocoronary bypass graft: Secondary | ICD-10-CM | POA: Diagnosis not present

## 2016-03-08 DIAGNOSIS — Z823 Family history of stroke: Secondary | ICD-10-CM | POA: Insufficient documentation

## 2016-03-08 DIAGNOSIS — I6522 Occlusion and stenosis of left carotid artery: Secondary | ICD-10-CM | POA: Insufficient documentation

## 2016-03-08 DIAGNOSIS — Z7902 Long term (current) use of antithrombotics/antiplatelets: Secondary | ICD-10-CM | POA: Diagnosis not present

## 2016-03-08 DIAGNOSIS — I2510 Atherosclerotic heart disease of native coronary artery without angina pectoris: Secondary | ICD-10-CM | POA: Diagnosis not present

## 2016-03-08 DIAGNOSIS — Z8249 Family history of ischemic heart disease and other diseases of the circulatory system: Secondary | ICD-10-CM | POA: Diagnosis not present

## 2016-03-08 DIAGNOSIS — Z79899 Other long term (current) drug therapy: Secondary | ICD-10-CM | POA: Diagnosis not present

## 2016-03-08 DIAGNOSIS — C50912 Malignant neoplasm of unspecified site of left female breast: Secondary | ICD-10-CM | POA: Insufficient documentation

## 2016-03-08 DIAGNOSIS — K219 Gastro-esophageal reflux disease without esophagitis: Secondary | ICD-10-CM | POA: Insufficient documentation

## 2016-03-08 DIAGNOSIS — E1122 Type 2 diabetes mellitus with diabetic chronic kidney disease: Secondary | ICD-10-CM | POA: Insufficient documentation

## 2016-03-08 DIAGNOSIS — E785 Hyperlipidemia, unspecified: Secondary | ICD-10-CM | POA: Insufficient documentation

## 2016-03-08 DIAGNOSIS — Z171 Estrogen receptor negative status [ER-]: Secondary | ICD-10-CM | POA: Diagnosis not present

## 2016-03-08 MED ORDER — HYDRALAZINE HCL 25 MG PO TABS: 25.0000 mg | ORAL_TABLET | Freq: Three times a day (TID) | ORAL | Status: AC

## 2016-03-08 MED ORDER — ROSUVASTATIN CALCIUM 40 MG PO TABS: 40.0000 mg | ORAL_TABLET | Freq: Every day | ORAL | Status: AC

## 2016-03-08 MED ORDER — EZETIMIBE 10 MG PO TABS: 10.0000 mg | ORAL_TABLET | Freq: Every day | ORAL | Status: AC

## 2016-03-08 NOTE — Progress Notes (Signed)
Patient ID: Penny James, female   DOB: May 24, 1951, 65 y.o.   MRN: [id number]   ADVANCED HF CLINIC NOTE  PCP: Primary Cardiologist: Dr Radford Pax   HPI: Penny James is a 65 y.o. female with a history of hx of CAD s/p CABG, HTN, diabetes, ischemic cardiomyopathy, combined systolic and diastolic CHF EF 123456 (echo 4/17), Recent dx of breast cancer (plan to start chemo) moderate to severe mitral regurgitation, PAD status post prior renal artery stenting, HL, carotid stenosis status post R CEA, CKD, bipolar affective d/o.    Admitted 12/16 with a non-STEMI. LHC demonstrated occluded vein grafts to the diagonal, ramus and distal LCx. LIMA-LAD was patent. She was not felt to be a surgical candidate. High risk left main PCI with an assist device was felt to be an option. Patient opted for medical therapy.  Admitted 3/17 with acute on chronic combined systolic and diastolic CHF. Cardiac enzymes remained negative. She was diuresed with IV Lasix. Isosorbide was added to her medical regimen. She had FU carotid US recently and LICA 123456.f/u with Dr. Donnetta Hutching. ?  Admitted 4/6 through 123456 with ADHF complicated by respiratory failure requiring short term intubation. Evaluated by palliative care and elected DNR. Discharge weight 144 pounds.  Recently found to have left breast CA (trple negative). Saw Dr. Lindi Adie. She states that she will not be undergoing chemotherapy for breast cancer. She does not feel she has breast cancer. She feels the titanium clip is causing most of her problems and is very fixated on it.   She was referred to the [facility] Clinic by Dr. Radford Pax. Saw Amy Clegg, NP-C 2 weeks ago. Volume status felt to be stable. NYHA III.   Here for f/u:  Overall feeling ok. SOB with very mild exertion. Able to do some house work but takes breaks. Gets tight in her chest when she walks. No change. Denies PND + Orthopnea. Using 3 liters oxygen. Weight at home stable 144 pounds. Appetite ok. Lives with  her husband. No dizziness.  Crestor stopped by Hospice. She is still with hospice  ECHO 02/03/2016: EF 20% grade 2 DD.   ROS: All systems negative except as listed in HPI, PMH and Problem List.  SH:  Social History   Social History  . Marital Status: Married    Spouse Name: N/A  . Number of Children: 3  . Years of Education: N/A   Occupational History  . DISABLED    Social History Main Topics  . Smoking status: Never Smoker   . Smokeless tobacco: Never Used  . Alcohol Use: No  . Drug Use: No  . Sexual Activity: No   Other Topics Concern  . Not on file   Social History Narrative    FH:  Family History  Problem Relation Age of Onset  . Heart disease Brother     Heart Disease before age 43  . Glaucoma Father   . Hypertension Father   . Stroke Paternal Grandmother   . Coronary artery disease Paternal Grandmother   . Cerebral aneurysm Brother   . Clotting disorder Sister   . Heart attack Neg Hx   . Stroke Father     Past Medical History  Diagnosis Date  . Hyperlipidemia   . Arthritis   . Anemia   . Chronic combined systolic and diastolic CHF (congestive heart failure) ([place])     a. EF 55% by cath 2010 but in 09/2015 found to be 20%, with mod-severe MR, grade 2 DD.  Penny James  Bipolar affective disorder ([place])   . GERD (gastroesophageal reflux disease)   . Obesity   . Peripheral neuropathy ([place])   . 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 CAD (coronary artery disease)     a. CABG in 2002. b. Cath 09/2015: poor targets, not a good surgical candidate, high risk PCI reserved for recurrent symptoms.  . Carotid artery stenosis     60-79% bilateral ICA stenosis s/p Right CEA 2013  followed by Dr. Donnetta Hutching  . Mitral regurgitation     a. Mod-severe by echo 09/2015.  . Ischemic cardiomyopathy   . CKD (chronic kidney disease), stage III   .  Breast cancer ([place])   . Elevated troponin 02/06/2016  . Ventricular tachycardia ([place]) 02/10/2016    Current Outpatient Prescriptions  Medication Sig Dispense Refill  . amiodarone (PACERONE) 200 MG tablet Take 1 tablet (200 mg total) by mouth daily. 90 tablet 3  . aspirin EC 81 MG tablet Take 81 mg by mouth daily.    Penny James BAYER MICROLET LANCETS lancets Use as instructed 100 each 3  . carvedilol (COREG) 6.25 MG tablet Take 1 tablet (6.25 mg total) by mouth 2 (two) times daily with a meal. 60 tablet 0  . chlorpheniramine-HYDROcodone (TUSSIONEX) 10-8 MG/5ML SUER Take 5 mLs by mouth at bedtime. 140 mL 0  . furosemide (LASIX) 40 MG tablet Take 1 tablet (40 mg total) by mouth daily. May take extra 40 mg tablet once daily as needed for weight 148 lbs or greater. 45 tablet 6  . Glucose Blood (BAYER BREEZE 2 TEST) DISK Test blood sugar four times a day 200 each 5  . hydrALAZINE (APRESOLINE) 10 MG tablet Take 1 tablet (10 mg total) by mouth every 8 (eight) hours. 90 tablet 0  . isosorbide mononitrate (IMDUR) 30 MG 24 hr tablet Take 3 tablets (90 mg total) by mouth daily. 30 tablet 0  . metoCLOPramide (REGLAN) 5 MG tablet Take 1 tablet (5 mg total) by mouth every 6 (six) hours as needed for nausea (nausea/headache). 20 tablet 0  . nitroGLYCERIN (NITROSTAT) 0.4 MG SL tablet Place 0.4 mg under the tongue every 5 (five) minutes as needed for chest pain. MAY TAKE UP TO 3 DOSES BEFORE CALLING  EMS    . olopatadine (PATANOL) 0.1 % ophthalmic solution Place 1 drop into both eyes 2 (two) times daily as needed for allergies.    Penny James PLAVIX 75 MG tablet TAKE 1 TABLET BY MOUTH EVERY DAY 90 tablet 0  . PROTONIX 40 MG tablet Take 1 tablet (40 mg total) by mouth daily. 90 tablet 3   No current facility-administered medications for this encounter.    Filed Vitals:   03/08/16 0927  BP: 118/58  Pulse: 68  Weight: 144 lb 6.4 oz (65.499 kg)  SpO2: 94%    PHYSICAL EXAM: General:  Elderly sitting in WC.Penny James No resp  difficulty. Son present.  HEENT: normal Neck: supple. JVP 5-6 . Carotids 2+ bilaterally; R CEA scar. + bialteral bruits. No lymphadenopathy or thryomegaly appreciated. Cor: PMI normal. Regular rate & rhythm. No rubs, gallops or murmurs. Lungs: clear on 3 liters [place] oxygen.  Abdomen: soft, nontender, nondistended. No hepatosplenomegaly. No bruits or masses. Good bowel sounds. Extremities: no cyanosis, clubbing, rash, edema Neuro: alert & orientedx3, cranial nerves grossly intact. Moves all 4 extremities w/o difficulty. Affect pleasant.  ASSESSMENT & PLAN: 1. Chronic Combine Systolic/Diastolic HF-ICM  0000000 ECHO EF 20% Grade 2 DD. Not interested in ICD.  -NYHA III. Volume status stable if not a bit dry. Continue current lasix for now. Check labs -Instructed to take an extra 40 mg lasix for weight 148 or greater. I have provided a weight chart and discusssed daily weights and to limit fluid intake to < 2 liters daily.  -Continue current carvedilol 6.25 mg twice a day -Increase hydralazine 25 mg tid + imdur 90 mg daily.  -No ace/arb/arni with CKD 2. CAD-s/p remote CABG with cath 09/2015 with all vein grafts occluded and LIMA to LAD widely patent but distal LAD with diffuse disease. Reviewed by Dr Tamala Julian. She decided not to pursue high risk PCI.    No chest Pain-. Continue plavix, statin, and bb. Continue imdur 90 mg daily.  3. H/O VT- Continue 400 mg daily. Decrease to 200 at next visit.  4. CKD- Creatinine recently stable. Recheck today/  5 Carotid artery stenosis    -80-99% on left. Asymptomatic. Encouraged her to f/u with Dr. Donnetta Hutching.   Overall plan for today:  1) increase hydralazine to 25 tid 2) HHPT if no longer in Hospice 3) resume Crestor 40/zetia 10. 4) f/u with Dr. Donnetta Hutching 5) breast cancer surgery would be high-risk but likely not prohibitive from a heart perspective if she and Dr. Lindi Adie wanted to pursue. Would be able to come off plavix for a short time. May need carotid addressed  first. 6) she is considering stopping hospice   Total time spent 45 minutes. Over half that time spent discussing above.   RTC in 4-6 weeks  Leyland Kenna 9:37 AM

## 2016-03-08 NOTE — Patient Instructions (Signed)
Will start home health physical therapy with [facility]. They will be in touch with you in the next few days to set up your initial in-home appointment.  INCREASE Hydralazine to 25 mg three times daily.  START Crestor 40 mg tablet once daily.  START Zetia 10 mg tablet once daily.  Follow up in 4-6 weeks with Dr. Haroldine Laws.  Do the following things EVERYDAY: 1) Weigh yourself in the morning before breakfast. Write it down and keep it in a log. 2) Take your medicines as prescribed 3) Eat low salt foods-Limit salt (sodium) to 2000 mg per day.  4) Stay as active as you can everyday 5) Limit all fluids for the day to less than 2 liters

## 2016-03-09 ENCOUNTER — Encounter (HOSPITAL_COMMUNITY): Payer: Medicare Other

## 2016-03-09 ENCOUNTER — Emergency Department (HOSPITAL_COMMUNITY)
Admission: EM | Admit: 2016-03-09 | Discharge: 2016-03-09 | Disposition: A | Discharge status: Home / Self Care | Attending: Emergency Medicine | Admitting: Emergency Medicine

## 2016-03-09 ENCOUNTER — Telehealth: Payer: Self-pay

## 2016-03-09 ENCOUNTER — Ambulatory Visit (HOSPITAL_COMMUNITY): Payer: Medicare Other

## 2016-03-09 ENCOUNTER — Encounter (HOSPITAL_COMMUNITY): Payer: Self-pay | Admitting: *Deleted

## 2016-03-09 ENCOUNTER — Telehealth: Payer: Self-pay | Admitting: Family Medicine

## 2016-03-09 ENCOUNTER — Emergency Department (HOSPITAL_COMMUNITY)

## 2016-03-09 ENCOUNTER — Telehealth (HOSPITAL_COMMUNITY): Payer: Self-pay | Admitting: *Deleted

## 2016-03-09 DIAGNOSIS — I5042 Chronic combined systolic (congestive) and diastolic (congestive) heart failure: Secondary | ICD-10-CM | POA: Insufficient documentation

## 2016-03-09 DIAGNOSIS — N183 Chronic kidney disease, stage 3 (moderate): Secondary | ICD-10-CM | POA: Insufficient documentation

## 2016-03-09 DIAGNOSIS — E669 Obesity, unspecified: Secondary | ICD-10-CM | POA: Insufficient documentation

## 2016-03-09 DIAGNOSIS — I5022 Chronic systolic (congestive) heart failure: Secondary | ICD-10-CM

## 2016-03-09 DIAGNOSIS — I2510 Atherosclerotic heart disease of native coronary artery without angina pectoris: Secondary | ICD-10-CM | POA: Insufficient documentation

## 2016-03-09 DIAGNOSIS — I5043 Acute on chronic combined systolic (congestive) and diastolic (congestive) heart failure: Secondary | ICD-10-CM | POA: Diagnosis present

## 2016-03-09 DIAGNOSIS — Z79899 Other long term (current) drug therapy: Secondary | ICD-10-CM | POA: Insufficient documentation

## 2016-03-09 DIAGNOSIS — R531 Weakness: Secondary | ICD-10-CM

## 2016-03-09 DIAGNOSIS — Z951 Presence of aortocoronary bypass graft: Secondary | ICD-10-CM | POA: Insufficient documentation

## 2016-03-09 DIAGNOSIS — E785 Hyperlipidemia, unspecified: Secondary | ICD-10-CM | POA: Insufficient documentation

## 2016-03-09 DIAGNOSIS — Z862 Personal history of diseases of the blood and blood-forming organs and certain disorders involving the immune mechanism: Secondary | ICD-10-CM | POA: Diagnosis not present

## 2016-03-09 DIAGNOSIS — E1122 Type 2 diabetes mellitus with diabetic chronic kidney disease: Secondary | ICD-10-CM | POA: Insufficient documentation

## 2016-03-09 DIAGNOSIS — Z7982 Long term (current) use of aspirin: Secondary | ICD-10-CM | POA: Diagnosis not present

## 2016-03-09 DIAGNOSIS — Z880 Allergy status to penicillin: Secondary | ICD-10-CM | POA: Insufficient documentation

## 2016-03-09 DIAGNOSIS — Z8659 Personal history of other mental and behavioral disorders: Secondary | ICD-10-CM | POA: Diagnosis not present

## 2016-03-09 DIAGNOSIS — Z7902 Long term (current) use of antithrombotics/antiplatelets: Secondary | ICD-10-CM | POA: Diagnosis not present

## 2016-03-09 DIAGNOSIS — R0602 Shortness of breath: Secondary | ICD-10-CM | POA: Diagnosis present

## 2016-03-09 DIAGNOSIS — Z9889 Other specified postprocedural states: Secondary | ICD-10-CM | POA: Diagnosis not present

## 2016-03-09 DIAGNOSIS — Z8669 Personal history of other diseases of the nervous system and sense organs: Secondary | ICD-10-CM | POA: Diagnosis not present

## 2016-03-09 DIAGNOSIS — M1990 Unspecified osteoarthritis, unspecified site: Secondary | ICD-10-CM | POA: Diagnosis not present

## 2016-03-09 DIAGNOSIS — Z8719 Personal history of other diseases of the digestive system: Secondary | ICD-10-CM | POA: Insufficient documentation

## 2016-03-09 LAB — CBC
HEMATOCRIT: 29.7 % — AB (ref 36.0–46.0)
HEMOGLOBIN: 9.8 g/dL — AB (ref 12.0–15.0)
MCH: 26.8 pg (ref 26.0–34.0)
MCHC: 33 g/dL (ref 30.0–36.0)
MCV: 81.1 fL (ref 78.0–100.0)
Platelets: 159 10*3/uL (ref 150–400)
RBC: 3.66 MIL/uL — ABNORMAL LOW (ref 3.87–5.11)
RDW: 17.5 % — ABNORMAL HIGH (ref 11.5–15.5)
WBC: 3.3 10*3/uL — ABNORMAL LOW (ref 4.0–10.5)

## 2016-03-09 LAB — BASIC METABOLIC PANEL
ANION GAP: 12 (ref 5–15)
BUN: 19 mg/dL (ref 6–20)
CO2: 25 mmol/L (ref 22–32)
Calcium: 9.3 mg/dL (ref 8.9–10.3)
Chloride: 92 mmol/L — ABNORMAL LOW (ref 101–111)
Creatinine, Ser: 1.85 mg/dL — ABNORMAL HIGH (ref 0.44–1.00)
GFR calc non Af Amer: 27 mL/min — ABNORMAL LOW (ref >60)
GFR, EST AFRICAN AMERICAN: 31 mL/min — AB (ref >60)
GLUCOSE: 161 mg/dL — AB (ref 65–99)
POTASSIUM: 3.6 mmol/L (ref 3.5–5.1)
Sodium: 129 mmol/L — ABNORMAL LOW (ref 135–145)

## 2016-03-09 LAB — I-STAT TROPONIN, ED: TROPONIN I, POC: 0.04 ng/mL (ref 0.00–0.08)

## 2016-03-09 LAB — BRAIN NATRIURETIC PEPTIDE: B Natriuretic Peptide: 2181.6 pg/mL — ABNORMAL HIGH (ref 0.0–100.0)

## 2016-03-09 MED ORDER — FUROSEMIDE 10 MG/ML IJ SOLN
40.0000 mg | Freq: Once | INTRAMUSCULAR | Status: AC
  Administered 2016-03-09: 40 mg via INTRAVENOUS
  Filled 2016-03-09: qty 4

## 2016-03-09 MED ORDER — POTASSIUM CHLORIDE CRYS ER 20 MEQ PO TBCR
10.0000 meq | EXTENDED_RELEASE_TABLET | Freq: Once | ORAL | Status: AC
  Administered 2016-03-09: 10 meq via ORAL
  Filled 2016-03-09: qty 1

## 2016-03-09 NOTE — ED Notes (Signed)
Pt reports onset today of sob. Has mild swelling noted to ankles, denies recent cough. spo2 98% on room air at triage, ekg done.

## 2016-03-09 NOTE — ED Provider Notes (Signed)
CSN: [id number]     Arrival date & time 03/09/16  1643 History   First MD Initiated Contact with Patient 03/09/16 1725     Chief Complaint  Patient presents with  . Shortness of Breath    HPI Patient is a 65 year old female with past medical history of CHF, diabetes, prior artery disease status post CABG in 2002, on chronic 3 L home O2, and multiple ED visits who presents complaining of shortness of breath. Patient reports she felt well when she woke up this morning but this afternoon had worsening shortness of breath. She reports that she thinks she overdid it this morning while cleaning her house and doing chores while not on her oxygen. Shortness of breath did not relieve with rest and her normal 3 L of oxygen, so she came to the emergency department for evaluation. Upon arrival and having labs drawn and taking nitro x1, patient reports her shortness of breath resolved. She reports her symptoms today were somewhat similar to when she had a heart attack in the past. She was evaluated yesterday in clinic and her heart failure doctor increased her hydralazine and added a prn Lasix dose. Denies any other recent medication changes. Denies chest pain, nausea or diaphoresis. She has chronic stable chest tightness that is unchanged, and is relieved with rest and nitro. Denies recent fever, chills, cough, sputum production or other illness. She has chronic lower extremity swelling that is at baseline for her.   Past Medical History  Diagnosis Date  . Hyperlipidemia   . Arthritis   . Anemia   . Chronic combined systolic and diastolic CHF (congestive heart failure) ([place])     a. EF 55% by cath 2010 but in 09/2015 found to be 20%, with mod-severe MR, grade 2 DD.  . Bipolar affective disorder ([place])   . GERD (gastroesophageal reflux disease)   . Obesity   . Peripheral neuropathy ([place])   . 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 Diabetes mellitus     retinopaty, neuropathy ischemic  cardiomyopathy with inferior hypokinesis EF normalized in 2005  . CAD (coronary artery disease)     a. CABG in 2002. b. Cath 09/2015: poor targets, not a good surgical candidate, high risk PCI reserved for recurrent symptoms.  . Carotid artery stenosis     60-79% bilateral ICA stenosis s/p Right CEA 2013  followed by Dr. Donnetta Hutching  . Mitral regurgitation     a. Mod-severe by echo 09/2015.  . Ischemic cardiomyopathy   . CKD (chronic kidney disease), stage III   . Breast cancer ([place])   . Elevated troponin 02/06/2016  . Ventricular tachycardia ([place]) 02/10/2016   Past Surgical History  Procedure Laterality Date  . Abdominal hysterectomy  1984  . Sp pta renal / visc  artery  2003    with stent of left renal artery  . Tonsillectomy      AS A CHILD  . Endarterectomy  02/18/2012    rocedure: ENDARTERECTOMY CAROTID;  Surgeon: Rosetta Posner, MD;  Location: [facility];  Service: Vascular;  Laterality: Right;  Right Carotid endarterectomy with Dacron patch angioplasty with resection of internal carotid artery  . Coronary artery bypass graft      3 vessel coronary disease S?P CABG with widely patent grafts by cath in 2005 and distal disease past the graft insertion sites.  . Cardiac catheterization  03/25/09    Occluded SVG to intermediate and SVG to OM/posterior descending artery with  PTCA left circ 6/10 s/p PTCA stent in SVG to OM/PDA, 07/2002 s/p PCI of left circ 03/2009  . Cardiac catheterization N/A 10/05/2015    Procedure: Left Heart Cath and Cors/Grafts Angiography;  Surgeon: Troy Sine, MD;  Location: [facility];  Service: Cardiovascular;  Laterality: N/A;   Family History  Problem Relation Age of Onset  . Heart disease Brother     Heart Disease before age 2  . Glaucoma Father   . Hypertension Father   . Stroke Paternal Grandmother   . Coronary artery disease Paternal Grandmother   . Cerebral aneurysm Brother   . Clotting disorder Sister   . Heart attack Neg Hx   . Stroke Father     Social History  Substance Use Topics  . Smoking status: Never Smoker   . Smokeless tobacco: Never Used  . Alcohol Use: No   OB History    No data available     Review of Systems  Constitutional: Negative for fever and chills.  HENT: Negative for congestion and rhinorrhea.   Eyes: Negative for visual disturbance.  Respiratory: Positive for chest tightness and shortness of breath.   Cardiovascular: Positive for leg swelling. Negative for chest pain.  Gastrointestinal: Negative for nausea, vomiting, abdominal pain, diarrhea and constipation.  Genitourinary: Negative for dysuria and difficulty urinating.  Musculoskeletal: Negative for back pain.  Skin: Negative for pallor and rash.  Neurological: Negative for light-headedness and headaches.  Psychiatric/Behavioral: Negative for confusion.    Allergies  Penicillins; Protamine; and Amlodipine  Home Medications   Prior to Admission medications   Medication Sig Start Date End Date Taking? Authorizing Provider  amiodarone (PACERONE) 200 MG tablet Take 1 tablet (200 mg total) by mouth daily. 03/02/16 03/16/16  Evelene Croon Barrett, PA-C  aspirin EC 81 MG tablet Take 81 mg by mouth daily.    Historical Provider, MD  BAYER MICROLET LANCETS lancets Use as instructed 08/19/15   Lucretia Kern, DO  carvedilol (COREG) 6.25 MG tablet Take 1 tablet (6.25 mg total) by mouth 2 (two) times daily with a meal. 02/11/16   Donne Hazel, MD  chlorpheniramine-HYDROcodone (TUSSIONEX) 10-8 MG/5ML SUER Take 5 mLs by mouth at bedtime. 02/27/16   Lucretia Kern, DO  ezetimibe (ZETIA) 10 MG tablet Take 1 tablet (10 mg total) by mouth daily. 03/08/16   Jolaine Artist, MD  furosemide (LASIX) 40 MG tablet Take 1 tablet (40 mg total) by mouth daily. May take extra 40 mg tablet once daily as needed for weight 148 lbs or greater. 02/17/16   Amy D Ninfa Meeker, NP  Glucose Blood (BAYER BREEZE 2 TEST) DISK Test blood sugar four times a day 08/19/15   Lucretia Kern, DO  hydrALAZINE  (APRESOLINE) 25 MG tablet Take 1 tablet (25 mg total) by mouth every 8 (eight) hours. 03/08/16   Jolaine Artist, MD  isosorbide mononitrate (IMDUR) 30 MG 24 hr tablet Take 3 tablets (90 mg total) by mouth daily. 02/11/16   Donne Hazel, MD  metoCLOPramide (REGLAN) 5 MG tablet Take 1 tablet (5 mg total) by mouth every 6 (six) hours as needed for nausea (nausea/headache). 02/24/16   Orpah Greek, MD  nitroGLYCERIN (NITROSTAT) 0.4 MG SL tablet Place 0.4 mg under the tongue every 5 (five) minutes as needed for chest pain. MAY TAKE UP TO 3 DOSES BEFORE CALLING  EMS    Historical Provider, MD  olopatadine (PATANOL) 0.1 % ophthalmic solution Place 1 drop into both eyes  2 (two) times daily as needed for allergies.    Historical Provider, MD  PLAVIX 75 MG tablet TAKE 1 TABLET BY MOUTH EVERY DAY 12/19/15   Sueanne Margarita, MD  PROTONIX 40 MG tablet Take 1 tablet (40 mg total) by mouth daily. 03/05/16   Rhonda G Barrett, PA-C  rosuvastatin (CRESTOR) 40 MG tablet Take 1 tablet (40 mg total) by mouth daily. 03/08/16   Shaune Pascal Bensimhon, MD   BP 139/69 mmHg  Pulse 61  Temp(Src) 98.1 F (36.7 C) (Oral)  Resp 18  SpO2 98% Physical Exam  Constitutional: She is oriented to person, place, and time. She appears well-developed and well-nourished. No distress.  HENT:  Head: Normocephalic and atraumatic.  Eyes: EOM are normal. Pupils are equal, round, and reactive to light.  Neck: Normal range of motion. Neck supple. JVD (mild) present.  Cardiovascular: Normal rate, regular rhythm and intact distal pulses.   Pulmonary/Chest: Effort normal. No respiratory distress. She has no wheezes. She has no rales.  Mildly diminished breath sounds at bases  Abdominal: Soft. She exhibits no distension. There is no tenderness.  Musculoskeletal: Normal range of motion. She exhibits edema (1+ edema of bilateral LE). She exhibits no tenderness.  Neurological: She is alert and oriented to person, place, and time.  Skin:  Skin is warm and dry. No rash noted.  Psychiatric: She has a normal mood and affect.  Nursing note and vitals reviewed.   ED Course  Procedures (including critical care time) Labs Review Labs Reviewed  BASIC METABOLIC PANEL - Abnormal; Notable for the following:    Sodium 129 (*)    Chloride 92 (*)    Glucose, Bld 161 (*)    Creatinine, Ser 1.85 (*)    GFR calc non Af Amer 27 (*)    GFR calc Af Amer 31 (*)    All other components within normal limits  CBC - Abnormal; Notable for the following:    WBC 3.3 (*)    RBC 3.66 (*)    Hemoglobin 9.8 (*)    HCT 29.7 (*)    RDW 17.5 (*)    All other components within normal limits  BRAIN NATRIURETIC PEPTIDE - Abnormal; Notable for the following:    B Natriuretic Peptide 2181.6 (*)    All other components within normal limits  I-STAT TROPOININ, ED    Imaging Review Dg Chest 2 View  03/09/2016  CLINICAL DATA:  Shortness of breath 1 day. EXAM: CHEST  2 VIEW COMPARISON:  02/07/2016 FINDINGS: Sternotomy wires are unchanged. Lungs are adequately inflated demonstrate small bilateral pleural effusions which are new. Likely associated bibasilar atelectasis, although cannot exclude infection in the lung bases. Moderate cardiomegaly. Calcified plaque over the aortic arch. There are degenerative changes of the spine. IMPRESSION: New small bilateral pleural effusions likely with associated bibasilar atelectasis although cannot exclude infection in the lung bases. Moderate cardiomegaly. Electronically Signed   By: Marin Olp M.D.   On: 03/09/2016 17:55   I have personally reviewed and evaluated these images and lab results as part of my medical decision-making.   EKG Interpretation  Date/Time:  Friday Mar 09 2016 16:47:43 EDT Ventricular Rate:  64 PR Interval:  208 QRS Duration: 162 QT Interval:  476 QTC Calculation: 491 R Axis:   99 Text Interpretation:  Normal sinus rhythm Possible Left atrial enlargement Rightward axis Left bundle branch  block Abnormal ECG No significant change since last tracing Reconfirmed by FLOYD MD, DANIEL [phone]) on 03/09/2016 6:40:24 PM  MDM   Final diagnoses:  Chronic combined systolic and diastolic heart failure ([place])   On arrival patient reports her symptoms have completely resolved. Afebrile, normotensive and maintaining appropriately O2 sats on chronic 3 L of oxygen. Patient appears mildly volume overloaded on exam with 1+ pedal edema and mild JVD. She does not have increased work of breathing. EKG shows sinus rhythm with left bundle branch block unchanged compared to prior. Initial Troponin is within normal limits. Chest x-ray shows small new bilateral pleural effusions without signs of pneumonia. Labs near patient's baseline w/ hyponatremia, BUN 19, creatinine 1.85, potassium 3.6, chronically elevated BNP. Chronic anemia with hemoglobin of 9.8. Given patient's significant cardiac history cardiology consulted to evaluate the patient and provide recommendations. Briefly feel patient is having a mild heart failure exacerbation. Doubt ACS given unchanged EKG and lack of chest pain or other symptoms at this time. Advised to give 1 dose of IV Lasix plus potassium replacement in the ED. After this, patient is stable for discharge home and will follow up with Dr. Jeffie Pollock in [place] clinic.   On reassessment after medicines patient she continues to feel much better. She was discharged in stable condition. Advised to resume her oral Lasix tomorrow and will follow-up as per cardiology. Return precautions discussed and patient reports understanding and agreement with plan. Will follow up with cardiology as scheduled.  This patient seen and discussed with my attending Dr. Moody Bruins, MD 03/10/16 [place], DO 03/10/16 [place], DO 03/10/16 1604

## 2016-03-09 NOTE — Telephone Encounter (Signed)
Received call from [facility] [place] RN who was calling as a courtesy to let us know pt has revoked her hospice services based on her latest appointment with Bensimhon, MD.  Stanton Kidney, RN states Bensimhon gave pt better prognosis and recommendations for PT.  Dr. Lindi Adie notified.

## 2016-03-09 NOTE — Consult Note (Signed)
CARDIOLOGY CONSULT NOTE   Patient ID: Penny James MRN: [id number] DOB/AGE: 1951-01-16 65 y.o.  Admit date: 03/09/2016  Primary Physician   Lucretia Kern., DO Primary Cardiologist   Dr Radford Pax CHF MD: Dr Haroldine Laws  Reason for Consultation   CHF  [id number] Penny James is a 65 y.o. year old female with a history of S-D-CHF, CABG, HTN, DM, ICM w/ EF 20% (echo 4/17), Recent dx of breast cancer (plan to start chemo) moderate to severe mitral regurgitation, PAD status post prior renal artery stenting, HL, carotid stenosis status post R CEA, CKD, bipolar affective d/o.   Admitted 12/16 with a non-STEMI. LHC demonstrated occluded vein grafts to the diagonal, ramus and distal LCx. LIMA-LAD was patent. She was not felt to be a surgical candidate. High risk left main PCI with an assist device was felt to be an option. Patient opted for medical therapy.  Admitted 3/17 with acute on chronic combined systolic and diastolic CHF. Cardiac enzymes remained negative. She was diuresed with IV Lasix. Isosorbide was added to her medical regimen. She had FU carotid US recently and LICA [id number].f/u with Dr. Donnetta Hutching. ?  Admitted 4/6 through 123456 with ADHF complicated by respiratory failure requiring short term intubation. Evaluated by palliative care and elected DNR. Discharge weight 144 pounds.  Recently found to have left breast CA (trple negative). Saw Dr. Lindi Adie. She states that she will not be undergoing chemotherapy for breast cancer. She does not feel she has breast cancer. She feels the titanium clip is causing most of her problems and is very fixated on it.   05/11 note by Dr Haroldine Laws: Here for f/u: Overall feeling ok. SOB with very mild exertion. Able to do some house work but takes breaks. Gets tight in her chest when she walks. No change. Denies PND + Orthopnea. Using 3 liters oxygen. Weight at home stable 144 pounds... Appetite ok. No dizziness. Crestor stopped by Hospice. She is  still with hospice. - Plan was for PRN Lasix 40 mg for wt > 148 lbs. Hydralazine increased to 25 mg tid.  Today, pt was busy around the house, feels she did more than she should. This pm, had worsening SOB. She had been walking outside without O2. Edema a little worse than usual. Wt this am no change. Has not taken mid-day hydralazine because she came to ER. No chest pain. After she came to the ER, her symptoms completely resolved. She feels well now and wants to go home. She is on oxygen.   Past Medical History  Diagnosis Date  . Hyperlipidemia   . Arthritis   . Anemia   . Chronic combined systolic and diastolic CHF (congestive heart failure) ([place])     a. EF 55% by cath 2010 but in 09/2015 found to be 20%, with mod-severe MR, grade 2 DD.  . Bipolar affective disorder ([place])   . GERD (gastroesophageal reflux disease)   . Obesity   . Peripheral neuropathy ([place])   . 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 CAD (coronary artery disease)     a. CABG in 2002. b. Cath 09/2015: poor targets, not a good surgical candidate, high risk PCI reserved for recurrent symptoms.  . Carotid artery stenosis     60-79% bilateral ICA stenosis s/p Right CEA 2013  followed by Dr. Donnetta Hutching  .  Mitral regurgitation     a. Mod-severe by echo 09/2015.  . Ischemic cardiomyopathy   . CKD (chronic kidney disease), stage III   . Breast cancer ([place])   . Elevated troponin 02/06/2016  . Ventricular tachycardia ([place]) 02/10/2016     Past Surgical History  Procedure Laterality Date  . Abdominal hysterectomy  1984  . Sp pta renal / visc  artery  2003    with stent of left renal artery  . Tonsillectomy      AS A CHILD  . Endarterectomy  02/18/2012    rocedure: ENDARTERECTOMY CAROTID;  Surgeon: Rosetta Posner, MD;  Location: [facility] OR;  Service: Vascular;  Laterality: Right;  Right Carotid  endarterectomy with Dacron patch angioplasty with resection of internal carotid artery  . Coronary artery bypass graft      3 vessel coronary disease S?P CABG with widely patent grafts by cath in 2005 and distal disease past the graft insertion sites.  . Cardiac catheterization  03/25/09    Occluded SVG to intermediate and SVG to OM/posterior descending artery with PTCA left circ 6/10 s/p PTCA stent in SVG to OM/PDA, 07/2002 s/p PCI of left circ 03/2009  . Cardiac catheterization N/A 10/05/2015    Procedure: Left Heart Cath and Cors/Grafts Angiography;  Surgeon: Troy Sine, MD;  Location: [facility];  Service: Cardiovascular;  Laterality: N/A;    Allergies  Allergen Reactions  . Penicillins Rash    Has patient had a PCN reaction causing immediate rash, facial/tongue/throat swelling, SOB or lightheadedness with hypotension: Yes Has patient had a PCN reaction causing severe rash involving mucus membranes or skin necrosis: Yes Has patient had a PCN reaction that required hospitalization Yes- admitted to hospital Has patient had a PCN reaction occurring within the last 10 years: No, childhood allergy If all of the above answers are "NO", then may proceed with Cephalosporin use.   . Protamine     Severe hypotension Internal bleeding around heart  . Amlodipine Swelling    Causes swelling in higher dosage    I have reviewed the patient's current medications Medication Sig  amiodarone (PACERONE) 200 MG tablet Take 1 tablet (200 mg total) by mouth daily.  aspirin EC 81 MG tablet Take 81 mg by mouth daily.  BAYER MICROLET LANCETS lancets Use as instructed  carvedilol (COREG) 6.25 MG tablet Take 1 tablet (6.25 mg total) by mouth 2 (two) times daily with a meal.  chlorpheniramine-HYDROcodone (TUSSIONEX) 10-8 MG/5ML SUER Take 5 mLs by mouth at bedtime.  ezetimibe (ZETIA) 10 MG tablet Take 1 tablet (10 mg total) by mouth daily.  furosemide (LASIX) 40 MG tablet Take 1 tablet (40 mg total)  by mouth daily. May take extra 40 mg tablet once daily as needed for weight 148 lbs or greater.  Glucose Blood (BAYER BREEZE 2 TEST) DISK Test blood sugar four times a day  hydrALAZINE (APRESOLINE) 25 MG tablet Take 1 tablet (25 mg total) by mouth every 8 (eight) hours.  isosorbide mononitrate (IMDUR) 30 MG 24 hr tablet Take 3 tablets (90 mg total) by mouth daily.  metoCLOPramide (REGLAN) 5 MG tablet Take 1 tablet (5 mg total) by mouth every 6 (six) hours as needed for nausea (nausea/headache).  nitroGLYCERIN (NITROSTAT) 0.4 MG SL tablet Place 0.4 mg under the tongue every 5 (five) minutes as needed for chest pain. MAY TAKE UP TO 3 DOSES BEFORE CALLING  EMS  olopatadine (PATANOL) 0.1 % ophthalmic solution Place 1 drop into both eyes 2 (  two) times daily as needed for allergies.  PLAVIX 75 MG tablet TAKE 1 TABLET BY MOUTH EVERY DAY  PROTONIX 40 MG tablet Take 1 tablet (40 mg total) by mouth daily.  rosuvastatin (CRESTOR) 40 MG tablet Take 1 tablet (40 mg total) by mouth daily.     Social History   Social History  . Marital Status: Married    Spouse Name: N/A  . Number of Children: 3  . Years of Education: N/A   Occupational History  . DISABLED    Social History Main Topics  . Smoking status: Never Smoker   . Smokeless tobacco: Never Used  . Alcohol Use: No  . Drug Use: No  . Sexual Activity: No   Other Topics Concern  . Not on file   Social History Narrative    Family Status  Relation Status Death Age  . Brother Alive   . Father Deceased 57  . Sister Alive   . Sister Alive   . Mother Deceased 51   Family History  Problem Relation Age of Onset  . Heart disease Brother     Heart Disease before age 29  . Glaucoma Father   . Hypertension Father   . Stroke Paternal Grandmother   . Coronary artery disease Paternal Grandmother   . Cerebral aneurysm Brother   . Clotting disorder Sister   . Heart attack Neg Hx   . Stroke Father      ROS:  Full 14 point review of systems  complete and found to be negative unless listed above.  Physical Exam: Blood pressure 144/75, pulse 63, temperature 98.1 F (36.7 C), temperature source Oral, resp. rate 20, SpO2 100 %.  General: Well developed, well nourished, female in no acute distress Head: Eyes PERRLA, No xanthomas.   Normocephalic and atraumatic, oropharynx without edema or exudate. Dentition: fair  Lungs: rales bases Heart: HRRR S1 S2, no rub/gallop, 2/6 murmur. pulses are 2+ all 4 extrem.   Neck: No carotid bruits. No lymphadenopathy.  JVD 8-9 cm. Abdomen: Bowel sounds present, abdomen soft and non-tender without masses or hernias noted. Msk:  No spine or cva tenderness. No weakness, no joint deformities or effusions. Extremities: No clubbing or cyanosis. 1+ edema.  Neuro: Alert and oriented X 3. No focal deficits noted. Psych:  Good affect, responds appropriately Skin: No rashes or lesions noted.  Labs:   Lab Results  Component Value Date   WBC 3.3* 03/09/2016   HGB 9.8* 03/09/2016   HCT 29.7* 03/09/2016   MCV 81.1 03/09/2016   PLT 159 03/09/2016    Recent Labs Lab 03/09/16 1722  NA 129*  K 3.6  CL 92*  CO2 25  BUN 19  CREATININE 1.85*  CALCIUM 9.3  GLUCOSE 161*    Recent Labs  03/09/16 1732  TROPIPOC 0.04   Echo: 02/03/2016 - Left ventricle: The cavity size was normal. Wall thickness was  normal. Systolic function was severely reduced. The estimated  ejection fraction was 20%. Diffuse hypokinesis. Features are  consistent with a pseudonormal left ventricular filling pattern,  with concomitant abnormal relaxation and increased filling  pressure (grade 2 diastolic dysfunction). Doppler parameters are  consistent with high ventricular filling pressure. - Aortic valve: There was trivial regurgitation. - Mitral valve: Calcified annulus. Mildly thickened leaflets .  There was moderate regurgitation. - Left atrium: The atrium was severely dilated. Impressions: - Severe global  reduction in LV function; grade 2 diastolic  dysfunction with elevated LV filling pressure; no LV thrombus  using definity; moderate LAE; moderate MR.  ECG:  03/09/2016 SR, LBBB - minimal QRS widening from 04/15  Radiology:  Dg Chest 2 View 03/09/2016  CLINICAL DATA:  Shortness of breath 1 day. EXAM: CHEST  2 VIEW COMPARISON:  02/07/2016 FINDINGS: Sternotomy wires are unchanged. Lungs are adequately inflated demonstrate small bilateral pleural effusions which are new. Likely associated bibasilar atelectasis, although cannot exclude infection in the lung bases. Moderate cardiomegaly. Calcified plaque over the aortic arch. There are degenerative changes of the spine. IMPRESSION: New small bilateral pleural effusions likely with associated bibasilar atelectasis although cannot exclude infection in the lung bases. Moderate cardiomegaly. Electronically Signed   By: Marin Olp M.D.   On: 03/09/2016 17:55    ASSESSMENT AND PLAN:   The patient was seen today by Dr Debara Pickett, the patient evaluated and the data reviewed.  Principal Problem:   Acute on chronic combined systolic and diastolic heart failure, NYHA class 3 (HCC) - mild volume overload by exam - may benefit from Lasix 40 mg IV x 1, will order - do not feel admission is needed - will arrange op f/u  Otherwise, continue current rx Active Problems:   SOB (shortness of breath)   SignedRosaria Ferries, PA-C 03/09/2016 8:01 PM Beeper [id number]  Co-Sign MD

## 2016-03-09 NOTE — Telephone Encounter (Signed)
Pt called to let us know that she will no longer be a hospice pt as of midnight tonight and would like to have PT set up for home.  Per OV note yesterday Dr Haroldine Laws wanted pt to have PT, order placed for [facility]

## 2016-03-09 NOTE — Telephone Encounter (Signed)
Penny James with [facility] would like dr kim to know  pt has revoked her hospice benefits.  pt has seen Dr Haroldine Laws, who is pursing a more agressive therapy,   ordered PT, and changed her medications.

## 2016-03-09 NOTE — Discharge Instructions (Signed)
Resume your  Normal Lasix dosing tomorrow.  Heart Failure Heart failure is a condition in which the heart has trouble pumping blood. This means your heart does not pump blood efficiently for your body to work well. In some cases of heart failure, fluid may back up into your lungs or you may have swelling (edema) in your lower legs. Heart failure is usually a long-term (chronic) condition. It is important for you to take good care of yourself and follow your health care provider's treatment plan. CAUSES  Some health conditions can cause heart failure. Those health conditions include:  High blood pressure (hypertension). Hypertension causes the heart muscle to work harder than normal. When pressure in the blood vessels is high, the heart needs to pump (contract) with more force in order to circulate blood throughout the body. High blood pressure eventually causes the heart to become stiff and weak.  Coronary artery disease (CAD). CAD is the buildup of cholesterol and fat (plaque) in the arteries of the heart. The blockage in the arteries deprives the heart muscle of oxygen and blood. This can cause chest pain and may lead to a heart attack. High blood pressure can also contribute to CAD.  Heart attack (myocardial infarction). A heart attack occurs when one or more arteries in the heart become blocked. The loss of oxygen damages the muscle tissue of the heart. When this happens, part of the heart muscle dies. The injured tissue does not contract as well and weakens the heart's ability to pump blood.  Abnormal heart valves. When the heart valves do not open and close properly, it can cause heart failure. This makes the heart muscle pump harder to keep the blood flowing.  Heart muscle disease (cardiomyopathy or myocarditis). Heart muscle disease is damage to the heart muscle from a variety of causes. These can include drug or alcohol abuse, infections, or unknown reasons. These can increase the risk of  heart failure.  Lung disease. Lung disease makes the heart work harder because the lungs do not work properly. This can cause a strain on the heart, leading it to fail.  Diabetes. Diabetes increases the risk of heart failure. High blood sugar contributes to high fat (lipid) levels in the blood. Diabetes can also cause slow damage to tiny blood vessels that carry important nutrients to the heart muscle. When the heart does not get enough oxygen and food, it can cause the heart to become weak and stiff. This leads to a heart that does not contract efficiently.  Other conditions can contribute to heart failure. These include abnormal heart rhythms, thyroid problems, and low blood counts (anemia). Certain unhealthy behaviors can increase the risk of heart failure, including:  Being overweight.  Smoking or chewing tobacco.  Eating foods high in fat and cholesterol.  Abusing illicit drugs or alcohol.  Lacking physical activity. SYMPTOMS  Heart failure symptoms may vary and can be hard to detect. Symptoms may include:  Shortness of breath with activity, such as climbing stairs.  Persistent cough.  Swelling of the feet, ankles, legs, or abdomen.  Unexplained weight gain.  Difficulty breathing when lying flat (orthopnea).  Waking from sleep because of the need to sit up and get more air.  Rapid heartbeat.  Fatigue and loss of energy.  Feeling light-headed, dizzy, or close to fainting.  Loss of appetite.  Nausea.  Increased urination during the night (nocturia). DIAGNOSIS  A diagnosis of heart failure is based on your history, symptoms, physical examination, and diagnostic tests.  Diagnostic tests for heart failure may include:  Echocardiography.  Electrocardiography.  Chest X-ray.  Blood tests.  Exercise stress test.  Cardiac angiography.  Radionuclide scans. TREATMENT  Treatment is aimed at managing the symptoms of heart failure. Medicines, behavioral changes, or  surgical intervention may be necessary to treat heart failure.  Medicines to help treat heart failure may include:  Angiotensin-converting enzyme (ACE) inhibitors. This type of medicine blocks the effects of a blood protein called angiotensin-converting enzyme. ACE inhibitors relax (dilate) the blood vessels and help lower blood pressure.  Angiotensin receptor blockers (ARBs). This type of medicine blocks the actions of a blood protein called angiotensin. Angiotensin receptor blockers dilate the blood vessels and help lower blood pressure.  Water pills (diuretics). Diuretics cause the kidneys to remove salt and water from the blood. The extra fluid is removed through urination. This loss of extra fluid lowers the volume of blood the heart pumps.  Beta blockers. These prevent the heart from beating too fast and improve heart muscle strength.  Digitalis. This increases the force of the heartbeat.  Healthy behavior changes include:  Obtaining and maintaining a healthy weight.  Stopping smoking or chewing tobacco.  Eating heart-healthy foods.  Limiting or avoiding alcohol.  Stopping illicit drug use.  Physical activity as directed by your health care provider.  Surgical treatment for heart failure may include:  A procedure to open blocked arteries, repair damaged heart valves, or remove damaged heart muscle tissue.  A pacemaker to improve heart muscle function and control certain abnormal heart rhythms.  An internal cardioverter defibrillator to treat certain serious abnormal heart rhythms.  A left ventricular assist device (LVAD) to assist the pumping ability of the heart. HOME CARE INSTRUCTIONS   Take medicines only as directed by your health care provider. Medicines are important in reducing the workload of your heart, slowing the progression of heart failure, and improving your symptoms.  Do not stop taking your medicine unless directed by your health care provider.  Do not  skip any dose of medicine.  Refill your prescriptions before you run out of medicine. Your medicines are needed every day.  Engage in moderate physical activity if directed by your health care provider. Moderate physical activity can benefit some people. The elderly and people with severe heart failure should consult with a health care provider for physical activity recommendations.  Eat heart-healthy foods. Food choices should be free of trans fat and low in saturated fat, cholesterol, and salt (sodium). Healthy choices include fresh or frozen fruits and vegetables, fish, lean meats, legumes, fat-free or low-fat dairy products, and whole grain or high fiber foods. Talk to a dietitian to learn more about heart-healthy foods.  Limit sodium if directed by your health care provider. Sodium restriction may reduce symptoms of heart failure in some people. Talk to a dietitian to learn more about heart-healthy seasonings.  Use healthy cooking methods. Healthy cooking methods include roasting, grilling, broiling, baking, poaching, steaming, or stir-frying. Talk to a dietitian to learn more about healthy cooking methods.  Limit fluids if directed by your health care provider. Fluid restriction may reduce symptoms of heart failure in some people.  Weigh yourself every day. Daily weights are important in the early recognition of excess fluid. You should weigh yourself every morning after you urinate and before you eat breakfast. Wear the same amount of clothing each time you weigh yourself. Record your daily weight. Provide your health care provider with your weight record.  Monitor and record your  blood pressure if directed by your health care provider.  Check your pulse if directed by your health care provider.  Lose weight if directed by your health care provider. Weight loss may reduce symptoms of heart failure in some people.  Stop smoking or chewing tobacco. Nicotine makes your heart work harder by  causing your blood vessels to constrict. Do not use nicotine gum or patches before talking to your health care provider.  Keep all follow-up visits as directed by your health care provider. This is important.  Limit alcohol intake to no more than 1 drink per day for nonpregnant women and 2 drinks per day for men. One drink equals 12 ounces of beer, 5 ounces of wine, or 1 ounces of hard liquor. Drinking more than that is harmful to your heart. Tell your health care provider if you drink alcohol several times a week. Talk with your health care provider about whether alcohol is safe for you. If your heart has already been damaged by alcohol or you have severe heart failure, drinking alcohol should be stopped completely.  Stop illicit drug use.  Stay up-to-date with immunizations. It is especially important to prevent respiratory infections through current pneumococcal and influenza immunizations.  Manage other health conditions such as hypertension, diabetes, thyroid disease, or abnormal heart rhythms as directed by your health care provider.  Learn to manage stress.  Plan rest periods when fatigued.  Learn strategies to manage high temperatures. If the weather is extremely hot:  Avoid vigorous physical activity.  Use air conditioning or fans or seek a cooler location.  Avoid caffeine and alcohol.  Wear loose-fitting, lightweight, and light-colored clothing.  Learn strategies to manage cold temperatures. If the weather is extremely cold:  Avoid vigorous physical activity.  Layer clothes.  Wear mittens or gloves, a hat, and a scarf when going outside.  Avoid alcohol.  Obtain ongoing education and support as needed.  Participate in or seek rehabilitation as needed to maintain or improve independence and quality of life. SEEK MEDICAL CARE IF:   You have a rapid weight gain.  You have increasing shortness of breath that is unusual for you.  You are unable to participate in your  usual physical activities.  You tire easily.  You cough more than normal, especially with physical activity.  You have any or more swelling in areas such as your hands, feet, ankles, or abdomen.  You are unable to sleep because it is hard to breathe.  You feel like your heart is beating fast (palpitations).  You become dizzy or light-headed upon standing up. SEEK IMMEDIATE MEDICAL CARE IF:   You have difficulty breathing.  There is a change in mental status such as decreased alertness or difficulty with concentration.  You have a pain or discomfort in your chest.  You have an episode of fainting (syncope). MAKE SURE YOU:   Understand these instructions.  Will watch your condition.  Will get help right away if you are not doing well or get worse.   This information is not intended to replace advice given to you by your health care provider. Make sure you discuss any questions you have with your health care provider.   Document Released: 10/15/2005 Document Revised: 03/01/2015 Document Reviewed: 11/14/2012 Elsevier Interactive Patient Education [employer].

## 2016-03-11 ENCOUNTER — Other Ambulatory Visit (HOSPITAL_COMMUNITY): Payer: Self-pay | Admitting: Internal Medicine

## 2016-03-12 ENCOUNTER — Telehealth (HOSPITAL_COMMUNITY): Payer: Self-pay | Admitting: *Deleted

## 2016-03-12 ENCOUNTER — Ambulatory Visit (HOSPITAL_COMMUNITY): Payer: Medicare Other

## 2016-03-12 NOTE — Telephone Encounter (Signed)
Pt called very concerned, she states she was d/c'd from hospice and now they are coming to pick up her oxygen today, she states she wears 3L continuously and needs it to breathe.  Reassured pt we will take care of this.  Spoke w/Melissa at [facility] she states they partner w/Hospice to provide oxygen to Hospice pt's so it is there equipment the pt has currently and so it will not be picked up from pt.  New order stating "transition oxygen from Hospice to [facility], 3 L [place] continuous" she will let me know if they need anything else.  Pt is also aware this is being taken care of.

## 2016-03-13 ENCOUNTER — Encounter (HOSPITAL_COMMUNITY): Payer: Medicare Other

## 2016-03-14 ENCOUNTER — Ambulatory Visit (HOSPITAL_COMMUNITY): Payer: Medicare Other

## 2016-03-16 ENCOUNTER — Ambulatory Visit (HOSPITAL_COMMUNITY): Payer: Medicare Other

## 2016-03-21 ENCOUNTER — Other Ambulatory Visit: Payer: Self-pay | Admitting: Radiology

## 2016-03-22 ENCOUNTER — Encounter (HOSPITAL_COMMUNITY): Payer: Medicare Other

## 2016-03-23 ENCOUNTER — Telehealth: Payer: Self-pay | Admitting: Student

## 2016-03-23 ENCOUNTER — Telehealth: Payer: Self-pay | Admitting: Cardiology

## 2016-03-23 ENCOUNTER — Ambulatory Visit (INDEPENDENT_AMBULATORY_CARE_PROVIDER_SITE_OTHER): Payer: Medicare Other | Admitting: Family Medicine

## 2016-03-23 ENCOUNTER — Encounter: Payer: Self-pay | Admitting: Family Medicine

## 2016-03-23 VITALS — BP 110/70 | HR 63 | Temp 98.2°F | Ht 63.0 in | Wt 141.2 lb

## 2016-03-23 DIAGNOSIS — J069 Acute upper respiratory infection, unspecified: Secondary | ICD-10-CM | POA: Diagnosis not present

## 2016-03-23 MED ORDER — BENZONATATE 100 MG PO CAPS
100.0000 mg | ORAL_CAPSULE | Freq: Three times a day (TID) | ORAL | Status: AC | PRN
Start: 2016-03-23 — End: ?

## 2016-03-23 NOTE — Progress Notes (Signed)
Pre visit review using our clinic review tool, if applicable. No additional management support is needed unless otherwise documented below in the visit note. 

## 2016-03-23 NOTE — Progress Notes (Signed)
HPI:  Penny James is a very pleasant 65 year old here for an acute visit for sore throat. She has had symptoms for 2 days. Symptoms include clear nasal congestion, postnasal drip, mild cough and sore throat. She denies fevers, sinus pain, ear pain, shortness of breath, wheezing or malaise. She reports she can't take any type of nasal spray or it makes her have a headache.She actually reports she is doing quite well. She is very happy that she had the clip removed from her breast. She reports that the doctor him removed her clip told her that she may not have breast cancer. She reports that she has a follow-up with her oncologist to discuss this further. She is doing physical therapy at home, prescribed by her cardiologist and reports she is doing quite well with this and has had several sessions this week. Denies chest pain, shortness of breath or any trouble breathing with her exercises.  ROS: See pertinent positives and negatives per HPI.  Past Medical History  Diagnosis Date  . Hyperlipidemia   . Arthritis   . Anemia   . Chronic combined systolic and diastolic CHF (congestive heart failure) ([place])     a. EF 55% by cath 2010 but in 09/2015 found to be 20%, with mod-severe MR, grade 2 DD.  . Bipolar affective disorder ([place])   . GERD (gastroesophageal reflux disease)   . Obesity   . Peripheral neuropathy ([place])   . 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 CAD (coronary artery disease)     a. CABG in 2002. b. Cath 09/2015: poor targets, not a good surgical candidate, high risk PCI reserved for recurrent symptoms.  . Carotid artery stenosis     60-79% bilateral ICA stenosis s/p Right CEA 2013  followed by Dr. Donnetta Hutching  . Mitral regurgitation     a. Mod-severe by echo 09/2015.  . Ischemic cardiomyopathy   . CKD (chronic kidney disease), stage III    . Breast cancer ([place])   . Elevated troponin 02/06/2016  . Ventricular tachycardia ([place]) 02/10/2016    Past Surgical History  Procedure Laterality Date  . Abdominal hysterectomy  1984  . Sp pta renal / visc  artery  2003    with stent of left renal artery  . Tonsillectomy      AS A CHILD  . Endarterectomy  02/18/2012    rocedure: ENDARTERECTOMY CAROTID;  Surgeon: Rosetta Posner, MD;  Location: [facility] OR;  Service: Vascular;  Laterality: Right;  Right Carotid endarterectomy with Dacron patch angioplasty with resection of internal carotid artery  . Coronary artery bypass graft      3 vessel coronary disease S?P CABG with widely patent grafts by cath in 2005 and distal disease past the graft insertion sites.  . Cardiac catheterization  03/25/09    Occluded SVG to intermediate and SVG to OM/posterior descending artery with PTCA left circ 6/10 s/p PTCA stent in SVG to OM/PDA, 07/2002 s/p PCI of left circ 03/2009  . Cardiac catheterization N/A 10/05/2015    Procedure: Left Heart Cath and Cors/Grafts Angiography;  Surgeon: Troy Sine, MD;  Location: [facility];  Service: Cardiovascular;  Laterality: N/A;    Family History  Problem Relation Age of Onset  . Heart disease Brother     Heart Disease before age 6  . Glaucoma Father   .  Hypertension Father   . Stroke Paternal Grandmother   . Coronary artery disease Paternal Grandmother   . Cerebral aneurysm Brother   . Clotting disorder Sister   . Heart attack Neg Hx   . Stroke Father     Social History   Social History  . Marital Status: Married    Spouse Name: N/A  . Number of Children: 3  . Years of Education: N/A   Occupational History  . DISABLED    Social History Main Topics  . Smoking status: Never Smoker   . Smokeless tobacco: Never Used  . Alcohol Use: No  . Drug Use: No  . Sexual Activity: No   Other Topics Concern  . None   Social History Narrative     Current outpatient prescriptions:  .  aspirin EC 81  MG tablet, Take 81 mg by mouth daily., Disp: , Rfl:  .  BAYER MICROLET LANCETS lancets, Use as instructed, Disp: 100 each, Rfl: 3 .  chlorpheniramine-HYDROcodone (TUSSIONEX) 10-8 MG/5ML SUER, Take 5 mLs by mouth at bedtime., Disp: 140 mL, Rfl: 0 .  COREG 6.25 MG tablet, TAKE 1 TABLET BY MOUTH TWICE A DAY WITH A MEAL, Disp: 60 tablet, Rfl: 0 .  ezetimibe (ZETIA) 10 MG tablet, Take 1 tablet (10 mg total) by mouth daily., Disp: 90 tablet, Rfl: 6 .  Glucose Blood (BAYER BREEZE 2 TEST) DISK, Test blood sugar four times a day, Disp: 200 each, Rfl: 5 .  hydrALAZINE (APRESOLINE) 25 MG tablet, Take 1 tablet (25 mg total) by mouth every 8 (eight) hours., Disp: 90 tablet, Rfl: 6 .  isosorbide mononitrate (IMDUR) 30 MG 24 hr tablet, Take 3 tablets (90 mg total) by mouth daily., Disp: 30 tablet, Rfl: 0 .  LASIX 40 MG tablet, TAKE 1 TABLET BY MOUTH EVERY DAY _GAVE 3NC_, Disp: 30 tablet, Rfl: 0 .  nitroGLYCERIN (NITROSTAT) 0.4 MG SL tablet, Place 0.4 mg under the tongue every 5 (five) minutes as needed for chest pain. MAY TAKE UP TO 3 DOSES BEFORE CALLING  EMS, Disp: , Rfl:  .  olopatadine (PATANOL) 0.1 % ophthalmic solution, Place 1 drop into both eyes 2 (two) times daily as needed for allergies., Disp: , Rfl:  .  PLAVIX 75 MG tablet, TAKE 1 TABLET BY MOUTH EVERY DAY, Disp: 90 tablet, Rfl: 0 .  rosuvastatin (CRESTOR) 40 MG tablet, Take 1 tablet (40 mg total) by mouth daily., Disp: 90 tablet, Rfl: 3 .  amiodarone (PACERONE) 200 MG tablet, Take 1 tablet (200 mg total) by mouth daily., Disp: 90 tablet, Rfl: 3 .  benzonatate (TESSALON PERLES) 100 MG capsule, Take 1 capsule (100 mg total) by mouth 3 (three) times daily as needed for cough., Disp: 20 capsule, Rfl: 0  EXAM:  Filed Vitals:   03/23/16 1629  BP: 110/70  Pulse: 63  Temp: 98.2 F (36.8 C)    Body mass index is 25.02 kg/(m^2).  GENERAL: vitals reviewed and listed above, alert, oriented, appears well hydrated and in no acute distress  HEENT:  atraumatic, conjunttiva clear, no obvious abnormalities on inspection of external nose and ears, normal appearance of ear canals and TMs, clear nasal congestion, mild post oropharyngeal erythema with PND, no tonsillar edema or exudate, no sinus TTP  NECK: no obvious masses on inspection  LUNGS: clear to auscultation bilaterally, no wheezes, rales or rhonchi, good air movement  CV: HRRR, no peripheral edema  MS: moves all extremities without noticeable abnormality  PSYCH: pleasant and cooperative, no obvious  depression or anxiety  ASSESSMENT AND PLAN:  Discussed the following assessment and plan:  Acute upper respiratory infection  -given HPI and exam findings today, a serious infection or illness is unlikely. We discussed potential etiologies, with VURI being most likely, and advised supportive care and monitoring. Tessalon for cough.We discussed treatment side effects, likely course, antibiotic misuse, transmission, and signs of developing a serious illness. I encouraged her to follow-up with her oncologist as planned, and advised it would be a miracle if her breast cancer had resolved. She continues to follow closely with her cardiologist. -of course, we advised to return or notify a doctor immediately if symptoms worsen or persist or new concerns arise.    Patient Instructions  INSTRUCTIONS FOR UPPER RESPIRATORY INFECTION:  -tessalon as needed for cough  -can use tylenol 500-1000mg  up to 3 times per day as needed for sore throat  -if you are taking a cough medication - use only as directed, may also try  throat lozenges.  -for sore throat, salt water gargles can help  -follow up if you have fevers, facial pain, tooth pain, difficulty breathing or are worsening or symptoms persist longer then expected  Upper Respiratory Infection, Adult An upper respiratory infection (URI) is also known as the common cold. It is often caused by a type of germ (virus). Colds are easily spread  (contagious). You can pass it to others by kissing, coughing, sneezing, or drinking out of the same glass. Usually, you get better in 1 to 3  weeks.  However, the cough can last for even longer. HOME CARE   Only take medicine as told by your doctor. Follow instructions provided above.  Drink enough water and fluids to keep your pee (urine) clear or pale yellow.  Get plenty of rest.  Return to work when your temperature is < 100 for 24 hours or as told by your doctor. You may use a face mask and wash your hands to stop your cold from spreading. GET HELP RIGHT AWAY IF:   After the first few days, you feel you are getting worse.  You have questions about your medicine.  You have chills, shortness of breath, or red spit (mucus).  You have pain in the face for more then 1-2 days, especially when you bend forward.  You have a fever, puffy (swollen) neck, pain when you swallow, or white spots in the back of your throat.  You have a bad headache, ear pain, sinus pain, or chest pain.  You have a high-pitched whistling sound when you breathe in and out (wheezing).  You cough up blood.  You have sore muscles or a stiff neck. MAKE SURE YOU:   Understand these instructions.  Will watch your condition.  Will get help right away if you are not doing well or get worse. Document Released: 04/02/2008 Document Revised: 01/07/2012 Document Reviewed: 01/20/2014 [facility] Patient Information 2015 [place], [place]. This information is not intended to replace advice given to you by your health care provider. Make sure you discuss any questions you have with your health care provider.      Colin Benton R.

## 2016-03-23 NOTE — Telephone Encounter (Signed)
Left message for patient to contact PCP about listed symptoms. Instructed her to call if she has any further questions.

## 2016-03-23 NOTE — Patient Instructions (Signed)
INSTRUCTIONS FOR UPPER RESPIRATORY INFECTION:  -tessalon as needed for cough  -can use tylenol 500-1000mg  up to 3 times per day as needed for sore throat  -if you are taking a cough medication - use only as directed, may also try  throat lozenges.  -for sore throat, salt water gargles can help  -follow up if you have fevers, facial pain, tooth pain, difficulty breathing or are worsening or symptoms persist longer then expected  Upper Respiratory Infection, Adult An upper respiratory infection (URI) is also known as the common cold. It is often caused by a type of germ (virus). Colds are easily spread (contagious). You can pass it to others by kissing, coughing, sneezing, or drinking out of the same glass. Usually, you get better in 1 to 3  weeks.  However, the cough can last for even longer. HOME CARE   Only take medicine as told by your doctor. Follow instructions provided above.  Drink enough water and fluids to keep your pee (urine) clear or pale yellow.  Get plenty of rest.  Return to work when your temperature is < 100 for 24 hours or as told by your doctor. You may use a face mask and wash your hands to stop your cold from spreading. GET HELP RIGHT AWAY IF:   After the first few days, you feel you are getting worse.  You have questions about your medicine.  You have chills, shortness of breath, or red spit (mucus).  You have pain in the face for more then 1-2 days, especially when you bend forward.  You have a fever, puffy (swollen) neck, pain when you swallow, or white spots in the back of your throat.  You have a bad headache, ear pain, sinus pain, or chest pain.  You have a high-pitched whistling sound when you breathe in and out (wheezing).  You cough up blood.  You have sore muscles or a stiff neck. MAKE SURE YOU:   Understand these instructions.  Will watch your condition.  Will get help right away if you are not doing well or get worse. Document Released:  04/02/2008 Document Revised: 01/07/2012 Document Reviewed: 01/20/2014 [facility] Patient Information 2015 [place], [place]. This information is not intended to replace advice given to you by your health care provider. Make sure you discuss any questions you have with your health care provider.

## 2016-03-23 NOTE — Telephone Encounter (Signed)
New message     The pt is feeling like they have a sore throat or something    Removed a clip the other day   The pt wants to know if she could have a infection from where the clip was at the Md remove, The pt wants to now if they have a antibotic called into [facility] on [address] phone number [phone].

## 2016-03-27 ENCOUNTER — Telehealth: Payer: Self-pay | Admitting: *Deleted

## 2016-03-27 ENCOUNTER — Other Ambulatory Visit: Payer: Self-pay | Admitting: Family Medicine

## 2016-03-27 NOTE — Telephone Encounter (Signed)
Pt states as soon as she takes the benzonatate (TESSALON PERLES) 100 MG capsule It makes her cough all day long.  Needs something else.  [facility] [facility]

## 2016-03-27 NOTE — Telephone Encounter (Signed)
I called patient to follow-up with her for REDS@[email or web address]  Study.Patient stayed was re-hospitalized on 02/02/16 with acute respiratory failure. I thanked patient for her participation in the study and this phone call completes her participation in the study.

## 2016-03-28 NOTE — Telephone Encounter (Signed)
Other then these, I would recommend halls cough drops and throat lozenges. Thanks.

## 2016-03-28 NOTE — Telephone Encounter (Addendum)
Spoke with patient. Instructed patient that she could take Halls cough drops or use throat lozenges. Patient verbalized understanding and stated she would try those. Patient to call back with any questions or concerns.

## 2016-03-30 ENCOUNTER — Observation Stay (HOSPITAL_COMMUNITY)
Admission: EM | Admit: 2016-03-30 | Discharge: 2016-04-28 | Disposition: E | Payer: Medicare Other | Attending: Internal Medicine | Admitting: Internal Medicine

## 2016-03-30 ENCOUNTER — Encounter (HOSPITAL_COMMUNITY): Payer: Self-pay

## 2016-03-30 DIAGNOSIS — E876 Hypokalemia: Secondary | ICD-10-CM

## 2016-03-30 DIAGNOSIS — T502X5A Adverse effect of carbonic-anhydrase inhibitors, benzothiadiazides and other diuretics, initial encounter: Secondary | ICD-10-CM | POA: Diagnosis not present

## 2016-03-30 DIAGNOSIS — Z66 Do not resuscitate: Secondary | ICD-10-CM | POA: Diagnosis not present

## 2016-03-30 DIAGNOSIS — I2510 Atherosclerotic heart disease of native coronary artery without angina pectoris: Secondary | ICD-10-CM | POA: Diagnosis not present

## 2016-03-30 DIAGNOSIS — Z7902 Long term (current) use of antithrombotics/antiplatelets: Secondary | ICD-10-CM | POA: Insufficient documentation

## 2016-03-30 DIAGNOSIS — Z6825 Body mass index (BMI) 25.0-25.9, adult: Secondary | ICD-10-CM | POA: Diagnosis not present

## 2016-03-30 DIAGNOSIS — E1122 Type 2 diabetes mellitus with diabetic chronic kidney disease: Secondary | ICD-10-CM | POA: Diagnosis not present

## 2016-03-30 DIAGNOSIS — R079 Chest pain, unspecified: Secondary | ICD-10-CM | POA: Diagnosis present

## 2016-03-30 DIAGNOSIS — I10 Essential (primary) hypertension: Secondary | ICD-10-CM | POA: Diagnosis present

## 2016-03-30 DIAGNOSIS — E7800 Pure hypercholesterolemia, unspecified: Secondary | ICD-10-CM | POA: Diagnosis not present

## 2016-03-30 DIAGNOSIS — R9431 Abnormal electrocardiogram [ECG] [EKG]: Secondary | ICD-10-CM

## 2016-03-30 DIAGNOSIS — I255 Ischemic cardiomyopathy: Secondary | ICD-10-CM | POA: Insufficient documentation

## 2016-03-30 DIAGNOSIS — Z853 Personal history of malignant neoplasm of breast: Secondary | ICD-10-CM | POA: Insufficient documentation

## 2016-03-30 DIAGNOSIS — D61818 Other pancytopenia: Secondary | ICD-10-CM | POA: Insufficient documentation

## 2016-03-30 DIAGNOSIS — E1151 Type 2 diabetes mellitus with diabetic peripheral angiopathy without gangrene: Secondary | ICD-10-CM | POA: Insufficient documentation

## 2016-03-30 DIAGNOSIS — I5042 Chronic combined systolic (congestive) and diastolic (congestive) heart failure: Secondary | ICD-10-CM | POA: Diagnosis present

## 2016-03-30 DIAGNOSIS — I150 Renovascular hypertension: Secondary | ICD-10-CM | POA: Diagnosis not present

## 2016-03-30 DIAGNOSIS — I130 Hypertensive heart and chronic kidney disease with heart failure and stage 1 through stage 4 chronic kidney disease, or unspecified chronic kidney disease: Secondary | ICD-10-CM | POA: Insufficient documentation

## 2016-03-30 DIAGNOSIS — N183 Chronic kidney disease, stage 3 unspecified: Secondary | ICD-10-CM | POA: Diagnosis present

## 2016-03-30 DIAGNOSIS — I5043 Acute on chronic combined systolic (congestive) and diastolic (congestive) heart failure: Secondary | ICD-10-CM | POA: Insufficient documentation

## 2016-03-30 DIAGNOSIS — Z7982 Long term (current) use of aspirin: Secondary | ICD-10-CM | POA: Insufficient documentation

## 2016-03-30 DIAGNOSIS — E669 Obesity, unspecified: Secondary | ICD-10-CM | POA: Diagnosis not present

## 2016-03-30 DIAGNOSIS — R0789 Other chest pain: Principal | ICD-10-CM | POA: Insufficient documentation

## 2016-03-30 DIAGNOSIS — R0602 Shortness of breath: Secondary | ICD-10-CM

## 2016-03-30 DIAGNOSIS — Z79899 Other long term (current) drug therapy: Secondary | ICD-10-CM | POA: Insufficient documentation

## 2016-03-30 DIAGNOSIS — Z951 Presence of aortocoronary bypass graft: Secondary | ICD-10-CM | POA: Insufficient documentation

## 2016-03-30 DIAGNOSIS — I4581 Long QT syndrome: Secondary | ICD-10-CM | POA: Diagnosis not present

## 2016-03-30 DIAGNOSIS — Z9862 Peripheral vascular angioplasty status: Secondary | ICD-10-CM | POA: Insufficient documentation

## 2016-03-30 LAB — I-STAT TROPONIN, ED: TROPONIN I, POC: 0.06 ng/mL (ref 0.00–0.08)

## 2016-03-30 MED ORDER — NITROGLYCERIN 0.4 MG SL SUBL
0.4000 mg | SUBLINGUAL_TABLET | SUBLINGUAL | Status: DC | PRN
  Administered 2016-03-31: 0.4 mg via SUBLINGUAL
  Filled 2016-03-30: qty 1

## 2016-03-30 NOTE — ED Notes (Signed)
Per EMS, pt from home with complaint of central chest tightness that began at 2000 while the pt was sitting on the couch watching tv. No radiation. Pt states that it feels like it did when she had her heart attack in April of this year. Pt has hx of MI in 2003 as well with open heart surgery. Lung sounds clear. EKG shows sinus with some LVH. Pt alert and oriented x4. Pt admits to sob and dizziness. Pt took 2 nitro prior to ems arrival without relief, and also took 4 baby aspirin.

## 2016-03-30 NOTE — ED Provider Notes (Signed)
CSN: [id number]     Arrival date & time 04/21/2016  2245 History  By signing my name below, I, Hansel Feinstein, attest that this documentation has been prepared under the direction and in the presence of Ripley Fraise, MD. Electronically Signed: Hansel Feinstein, ED Scribe. 04/14/2016. 11:11 PM.    Chief Complaint  Patient presents with  . Chest Pain   Patient is a 65 y.o. female presenting with chest pain. The history is provided by the patient. No language interpreter was used.  Chest Pain Pain location:  Substernal area Pain quality: pressure and tightness   Pain radiates to:  Does not radiate Pain radiates to the back: no   Pain severity:  Moderate Onset quality:  Gradual Duration:  3 hours Timing:  Constant Context: at rest   Relieved by:  Nothing Worsened by:  Nothing tried Ineffective treatments:  Nitroglycerin and aspirin Associated symptoms: cough, dizziness and shortness of breath   Associated symptoms: no abdominal pain, no fever and not vomiting   Risk factors: coronary artery disease, diabetes mellitus and high cholesterol    HPI Comments: Penny James is a 65 y.o. female with h/o HLD, CHF, GERD, renovascular HTN, DM, CAD, ICM, CKDIII who presents to the Emergency Department complaining of moderate, constant substernal chest pain and tightness onset this evening at 8:30PM while at rest. Pt reports associated dizziness, SOB and coughing. Pt describes her pain as non-radiating with a pressure sensation. She reports h/o similar symptoms, which are normally relieved by Nitro Stat. Pt reports she experienced no relief of symptoms this evening after taking 2 Nitro Stat and four 81 mg ASA. She denies fever, emesis, syncope, hemoptysis, diarrhea, abdominal pain. Pt takes qd Plavix.   Past Medical History  Diagnosis Date  . Hyperlipidemia   . Arthritis   . Anemia   . Chronic combined systolic and diastolic CHF (congestive heart failure) ([place])     a. EF 55% by cath 2010 but in  09/2015 found to be 20%, with mod-severe MR, grade 2 DD.  . Bipolar affective disorder ([place])   . GERD (gastroesophageal reflux disease)   . Obesity   . Peripheral neuropathy ([place])   . 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 CAD (coronary artery disease)     a. CABG in 2002. b. Cath 09/2015: poor targets, not a good surgical candidate, high risk PCI reserved for recurrent symptoms.  . Carotid artery stenosis     60-79% bilateral ICA stenosis s/p Right CEA 2013  followed by Dr. Donnetta Hutching  . Mitral regurgitation     a. Mod-severe by echo 09/2015.  . Ischemic cardiomyopathy   . CKD (chronic kidney disease), stage III   . Breast cancer ([place])   . Elevated troponin 02/06/2016  . Ventricular tachycardia ([place]) 02/10/2016   Past Surgical History  Procedure Laterality Date  . Abdominal hysterectomy  1984  . Sp pta renal / visc  artery  2003    with stent of left renal artery  . Tonsillectomy      AS A CHILD  . Endarterectomy  02/18/2012    rocedure: ENDARTERECTOMY CAROTID;  Surgeon: Rosetta Posner, MD;  Location: [facility] OR;  Service: Vascular;  Laterality: Right;  Right Carotid endarterectomy with Dacron patch angioplasty with resection of internal carotid artery  . Coronary artery bypass graft  3 vessel coronary disease S?P CABG with widely patent grafts by cath in 2005 and distal disease past the graft insertion sites.  . Cardiac catheterization  03/25/09    Occluded SVG to intermediate and SVG to OM/posterior descending artery with PTCA left circ 6/10 s/p PTCA stent in SVG to OM/PDA, 07/2002 s/p PCI of left circ 03/2009  . Cardiac catheterization N/A 10/05/2015    Procedure: Left Heart Cath and Cors/Grafts Angiography;  Surgeon: Troy Sine, MD;  Location: [facility];  Service: Cardiovascular;  Laterality: N/A;   Family History  Problem Relation  Age of Onset  . Heart disease Brother     Heart Disease before age 57  . Glaucoma Father   . Hypertension Father   . Stroke Paternal Grandmother   . Coronary artery disease Paternal Grandmother   . Cerebral aneurysm Brother   . Clotting disorder Sister   . Heart attack Neg Hx   . Stroke Father    Social History  Substance Use Topics  . Smoking status: Never Smoker   . Smokeless tobacco: Never Used  . Alcohol Use: No   OB History    No data available     Review of Systems  Constitutional: Negative for fever.  Respiratory: Positive for cough, chest tightness and shortness of breath.   Cardiovascular: Positive for chest pain.  Gastrointestinal: Negative for vomiting, abdominal pain and diarrhea.  Neurological: Positive for dizziness. Negative for syncope.  All other systems reviewed and are negative.  Allergies  Penicillins; Protamine; and Amlodipine  Home Medications   Prior to Admission medications   Medication Sig Start Date End Date Taking? Authorizing Provider  amiodarone (PACERONE) 200 MG tablet Take 1 tablet (200 mg total) by mouth daily. 03/02/16 03/16/16  Evelene Croon Barrett, PA-C  aspirin EC 81 MG tablet Take 81 mg by mouth daily.    Historical Provider, MD  BAYER MICROLET LANCETS lancets Use as instructed 08/19/15   Lucretia Kern, DO  benzonatate (TESSALON PERLES) 100 MG capsule Take 1 capsule (100 mg total) by mouth 3 (three) times daily as needed for cough. 03/23/16   Lucretia Kern, DO  chlorpheniramine-HYDROcodone (TUSSIONEX) 10-8 MG/5ML SUER Take 5 mLs by mouth at bedtime. 02/27/16   Lucretia Kern, DO  COREG 6.25 MG tablet TAKE 1 TABLET BY MOUTH TWICE A DAY WITH A MEAL 03/12/16   Jolaine Artist, MD  ezetimibe (ZETIA) 10 MG tablet Take 1 tablet (10 mg total) by mouth daily. 03/08/16   Jolaine Artist, MD  Glucose Blood (BAYER BREEZE 2 TEST) DISK Test blood sugar four times a day 08/19/15   Lucretia Kern, DO  hydrALAZINE (APRESOLINE) 25 MG tablet Take 1 tablet (25 mg  total) by mouth every 8 (eight) hours. 03/08/16   Jolaine Artist, MD  isosorbide mononitrate (IMDUR) 30 MG 24 hr tablet Take 3 tablets (90 mg total) by mouth daily. 02/11/16   Donne Hazel, MD  LASIX 40 MG tablet TAKE 1 TABLET BY MOUTH EVERY DAY _GAVE 3NC_ 03/12/16   Jolaine Artist, MD  nitroGLYCERIN (NITROSTAT) 0.4 MG SL tablet Place 0.4 mg under the tongue every 5 (five) minutes as needed for chest pain. MAY TAKE UP TO 3 DOSES BEFORE CALLING  EMS    Historical Provider, MD  olopatadine (PATANOL) 0.1 % ophthalmic solution Place 1 drop into both eyes 2 (two) times daily as needed for allergies.    Historical Provider, MD  PLAVIX 75 MG tablet TAKE  1 TABLET BY MOUTH EVERY DAY 12/19/15   Sueanne Margarita, MD  rosuvastatin (CRESTOR) 40 MG tablet Take 1 tablet (40 mg total) by mouth daily. 03/08/16   Shaune Pascal Bensimhon, MD   BP 133/55 mmHg  Pulse 62  Temp(Src) 98 F (36.7 C) (Oral)  Resp 14  Ht 5\' 3"  (1.6 m)  Wt 149 lb (67.586 kg)  BMI 26.40 kg/m2  SpO2 99% Physical Exam CONSTITUTIONAL: Well developed/well nourished HEAD: Normocephalic/atraumatic EYES: EOMI/PERRL ENMT: Mucous membranes moist NECK: supple no meningeal signs SPINE/BACK:entire spine nontender CV: irregular murmur noted.  LUNGS: Lungs are clear to auscultation bilaterally, no apparent distress ABDOMEN: soft, nontender NEURO: Pt is awake/alert/appropriate, moves all extremitiesx4.  No facial droop.   EXTREMITIES: pulses normal/equal, full ROM. Mild pitting edema noted.  SKIN: warm, color normal PSYCH: no abnormalities of mood noted, alert and oriented to situation   ED Course  Procedures  Medications  nitroGLYCERIN (NITROSTAT) SL tablet 0.4 mg (0.4 mg Sublingual Given  0000)  potassium chloride 10 mEq in 100 mL IVPB (10 mEq Intravenous New Bag/Given  0113)  potassium chloride SA (K-DUR,KLOR-CON) CR tablet 40 mEq (40 mEq Oral Given  0113)    DIAGNOSTIC STUDIES: Oxygen Saturation is 99% on RA, normal  by my interpretation.    COORDINATION OF CARE: 11:08 PM Discussed treatment plan with pt at bedside which includes lab work, CXR, EKG and pt agreed to plan. 11:47 PM I reviewed charting Pt has declined cardiac cath in past I spoke to her about this and she confirms this  She would prefer to go home if she can be made comfortable She is feeling improved at this time Family at bedside Tillie Rung) and she confirms this story 1:44 AM Pt with significant hyPOkalemia and prolonged QT on EKG Will need admission for replacement of K Patient agreeable with plan D/w dr Arnoldo Morale for admission   Labs Review Labs Reviewed  BASIC METABOLIC PANEL - Abnormal; Notable for the following:    Sodium 127 (*)    Potassium 2.4 (*)    Chloride 87 (*)    Creatinine, Ser 1.54 (*)    GFR calc non Af Amer 34 (*)    GFR calc Af Amer 39 (*)    All other components within normal limits  CBC - Abnormal; Notable for the following:    WBC 2.9 (*)    RBC 3.64 (*)    Hemoglobin 9.9 (*)    HCT 29.5 (*)    RDW 17.1 (*)    All other components within normal limits  MAGNESIUM  I-STAT TROPOININ, ED    Imaging Review Dg Chest 2 View    CLINICAL DATA:  Central chest pain and shortness of breath since yesterday EXAM: CHEST  2 VIEW COMPARISON:  03/09/2016 FINDINGS: Chronic cardiomegaly. The patient is status post CABG. Stable aortic and hilar contours. Trace pleural effusions.  No edema or pneumonia.  No pneumothorax. IMPRESSION: Chronic cardiomegaly.  Trace effusions without pulmonary edema. Electronically Signed   By: Monte Fantasia M.D.   On:  00:56   I have personally reviewed and evaluated these images and lab results as part of my medical decision-making.   EKG Interpretation   Date/Time:  Friday March 30 2016 22:45:36 EDT Ventricular Rate:  62 PR Interval:    QRS Duration: 187 QT Interval:  510 QTC Calculation: 518 R Axis:   96 Text Interpretation:  Atrial fibrillation Non-specific  intra-ventricular  conduction delay ST-t wave abnormality No significant change since last  tracing Prolonged QT interval Abnormal ekg Confirmed by Carmin Muskrat   MD [phone]) on 04/02/2016 10:49:42 PM      MDM   Final diagnoses:  Hypokalemia  Prolonged Q-T interval on ECG    Nursing notes including past medical history and social history reviewed and considered in documentation Labs/vital reviewed myself and considered during evaluation xrays/imaging reviewed by myself and considered during evaluation    I personally performed the services described in this documentation, which was scribed in my presence. The recorded information has been reviewed and is accurate.       Ripley Fraise, MD  [phone]

## 2016-03-31 ENCOUNTER — Emergency Department (HOSPITAL_COMMUNITY): Payer: Medicare Other

## 2016-03-31 ENCOUNTER — Observation Stay (HOSPITAL_COMMUNITY): Payer: Medicare Other

## 2016-03-31 DIAGNOSIS — I130 Hypertensive heart and chronic kidney disease with heart failure and stage 1 through stage 4 chronic kidney disease, or unspecified chronic kidney disease: Secondary | ICD-10-CM | POA: Diagnosis not present

## 2016-03-31 DIAGNOSIS — I25119 Atherosclerotic heart disease of native coronary artery with unspecified angina pectoris: Secondary | ICD-10-CM | POA: Diagnosis not present

## 2016-03-31 DIAGNOSIS — R079 Chest pain, unspecified: Secondary | ICD-10-CM | POA: Diagnosis not present

## 2016-03-31 DIAGNOSIS — N183 Chronic kidney disease, stage 3 unspecified: Secondary | ICD-10-CM | POA: Diagnosis present

## 2016-03-31 DIAGNOSIS — I472 Ventricular tachycardia: Secondary | ICD-10-CM

## 2016-03-31 DIAGNOSIS — E1122 Type 2 diabetes mellitus with diabetic chronic kidney disease: Secondary | ICD-10-CM | POA: Diagnosis not present

## 2016-03-31 DIAGNOSIS — I5043 Acute on chronic combined systolic (congestive) and diastolic (congestive) heart failure: Secondary | ICD-10-CM | POA: Diagnosis not present

## 2016-03-31 DIAGNOSIS — E876 Hypokalemia: Secondary | ICD-10-CM

## 2016-03-31 DIAGNOSIS — I255 Ischemic cardiomyopathy: Secondary | ICD-10-CM | POA: Diagnosis not present

## 2016-03-31 DIAGNOSIS — E1151 Type 2 diabetes mellitus with diabetic peripheral angiopathy without gangrene: Secondary | ICD-10-CM

## 2016-03-31 DIAGNOSIS — R9431 Abnormal electrocardiogram [ECG] [EKG]: Secondary | ICD-10-CM

## 2016-03-31 DIAGNOSIS — R0789 Other chest pain: Secondary | ICD-10-CM | POA: Diagnosis not present

## 2016-03-31 DIAGNOSIS — I4581 Long QT syndrome: Secondary | ICD-10-CM | POA: Diagnosis not present

## 2016-03-31 DIAGNOSIS — I5023 Acute on chronic systolic (congestive) heart failure: Secondary | ICD-10-CM

## 2016-03-31 DIAGNOSIS — I2510 Atherosclerotic heart disease of native coronary artery without angina pectoris: Secondary | ICD-10-CM | POA: Diagnosis present

## 2016-03-31 DIAGNOSIS — D61818 Other pancytopenia: Secondary | ICD-10-CM

## 2016-03-31 LAB — BASIC METABOLIC PANEL
ANION GAP: 13 (ref 5–15)
Anion gap: 12 (ref 5–15)
Anion gap: 13 (ref 5–15)
BUN: 16 mg/dL (ref 6–20)
BUN: 16 mg/dL (ref 6–20)
BUN: 19 mg/dL (ref 6–20)
CALCIUM: 9 mg/dL (ref 8.9–10.3)
CHLORIDE: 87 mmol/L — AB (ref 101–111)
CHLORIDE: 91 mmol/L — AB (ref 101–111)
CO2: 28 mmol/L (ref 22–32)
CO2: 24 mmol/L (ref 22–32)
CO2: 22 mmol/L (ref 22–32)
CREATININE: 1.54 mg/dL — AB (ref 0.44–1.00)
Calcium: 9.2 mg/dL (ref 8.9–10.3)
Calcium: 9.2 mg/dL (ref 8.9–10.3)
Chloride: 88 mmol/L — ABNORMAL LOW (ref 101–111)
Creatinine, Ser: 1.44 mg/dL — ABNORMAL HIGH (ref 0.44–1.00)
Creatinine, Ser: 1.8 mg/dL — ABNORMAL HIGH (ref 0.44–1.00)
GFR calc Af Amer: 42 mL/min — ABNORMAL LOW (ref >60)
GFR calc Af Amer: 32 mL/min — ABNORMAL LOW (ref >60)
GFR calc non Af Amer: 34 mL/min — ABNORMAL LOW (ref >60)
GFR calc non Af Amer: 37 mL/min — ABNORMAL LOW (ref >60)
GFR calc non Af Amer: 28 mL/min — ABNORMAL LOW (ref >60)
GFR, EST AFRICAN AMERICAN: 39 mL/min — AB (ref >60)
GLUCOSE: 118 mg/dL — AB (ref 65–99)
Glucose, Bld: 92 mg/dL (ref 65–99)
Glucose, Bld: 123 mg/dL — ABNORMAL HIGH (ref 65–99)
POTASSIUM: 2.4 mmol/L — AB (ref 3.5–5.1)
POTASSIUM: 4.1 mmol/L (ref 3.5–5.1)
Potassium: 3 mmol/L — ABNORMAL LOW (ref 3.5–5.1)
SODIUM: 126 mmol/L — AB (ref 135–145)
Sodium: 127 mmol/L — ABNORMAL LOW (ref 135–145)
Sodium: 125 mmol/L — ABNORMAL LOW (ref 135–145)

## 2016-03-31 LAB — CBC
HEMATOCRIT: 29.5 % — AB (ref 36.0–46.0)
HEMATOCRIT: 27.7 % — AB (ref 36.0–46.0)
HEMOGLOBIN: 9.9 g/dL — AB (ref 12.0–15.0)
HEMOGLOBIN: 9.2 g/dL — AB (ref 12.0–15.0)
MCH: 27.2 pg (ref 26.0–34.0)
MCH: 27 pg (ref 26.0–34.0)
MCHC: 33.6 g/dL (ref 30.0–36.0)
MCHC: 33.2 g/dL (ref 30.0–36.0)
MCV: 81 fL (ref 78.0–100.0)
MCV: 81.2 fL (ref 78.0–100.0)
PLATELETS: 154 10*3/uL (ref 150–400)
Platelets: 162 10*3/uL (ref 150–400)
RBC: 3.64 MIL/uL — AB (ref 3.87–5.11)
RBC: 3.41 MIL/uL — ABNORMAL LOW (ref 3.87–5.11)
RDW: 17.1 % — ABNORMAL HIGH (ref 11.5–15.5)
RDW: 17.5 % — ABNORMAL HIGH (ref 11.5–15.5)
WBC: 2.9 10*3/uL — ABNORMAL LOW (ref 4.0–10.5)
WBC: 2.5 10*3/uL — ABNORMAL LOW (ref 4.0–10.5)

## 2016-03-31 LAB — VITAMIN B12: Vitamin B-12: 669 pg/mL (ref 180–914)

## 2016-03-31 LAB — HEPATIC FUNCTION PANEL
ALBUMIN: 3.7 g/dL (ref 3.5–5.0)
ALK PHOS: 41 U/L (ref 38–126)
ALT: 13 U/L — ABNORMAL LOW (ref 14–54)
AST: 25 U/L (ref 15–41)
BILIRUBIN DIRECT: 0.3 mg/dL (ref 0.1–0.5)
BILIRUBIN INDIRECT: 0.9 mg/dL (ref 0.3–0.9)
BILIRUBIN TOTAL: 1.2 mg/dL (ref 0.3–1.2)
Total Protein: 6.3 g/dL — ABNORMAL LOW (ref 6.5–8.1)

## 2016-03-31 LAB — GLUCOSE, CAPILLARY: Glucose-Capillary: 122 mg/dL — ABNORMAL HIGH (ref 65–99)

## 2016-03-31 LAB — IRON AND TIBC
Iron: 33 ug/dL (ref 28–170)
SATURATION RATIOS: 15 % (ref 10.4–31.8)
TIBC: 223 ug/dL — AB (ref 250–450)
UIBC: 190 ug/dL

## 2016-03-31 LAB — FOLATE: Folate: 23.3 ng/mL (ref >5.9)

## 2016-03-31 LAB — BLOOD GAS, ARTERIAL
Acid-base deficit: 3.8 mmol/L — ABNORMAL HIGH (ref 0.0–2.0)
Bicarbonate: 22.1 mEq/L (ref 20.0–24.0)
Drawn by: 10552
FIO2: 100
O2 Saturation: 94.9 %
PCO2 ART: 50.1 mmHg — AB (ref 35.0–45.0)
PH ART: 7.267 — AB (ref 7.350–7.450)
PO2 ART: 92.7 mmHg (ref 80.0–100.0)
Patient temperature: 98.6
TCO2: 23.6 mmol/L (ref 0–100)

## 2016-03-31 LAB — MRSA PCR SCREENING: MRSA by PCR: NEGATIVE

## 2016-03-31 LAB — POTASSIUM: Potassium: 3.1 mmol/L — ABNORMAL LOW (ref 3.5–5.1)

## 2016-03-31 LAB — FERRITIN: FERRITIN: 170 ng/mL (ref 11–307)

## 2016-03-31 LAB — TROPONIN I
Troponin I: 0.07 ng/mL — ABNORMAL HIGH (ref <0.031)
Troponin I: 0.55 ng/mL (ref <0.031)
Troponin I: 2.02 ng/mL (ref <0.031)

## 2016-03-31 LAB — RETICULOCYTES
RBC.: 3.43 MIL/uL — AB (ref 3.87–5.11)
RETIC CT PCT: 2.1 % (ref 0.4–3.1)
Retic Count, Absolute: 72 10*3/uL (ref 19.0–186.0)

## 2016-03-31 LAB — MAGNESIUM
Magnesium: 1.9 mg/dL (ref 1.7–2.4)
Magnesium: 2.2 mg/dL (ref 1.7–2.4)

## 2016-03-31 MED ORDER — POTASSIUM CHLORIDE 10 MEQ/100ML IV SOLN
10.0000 meq | INTRAVENOUS | Status: AC
  Administered 2016-03-31 (×4): 10 meq via INTRAVENOUS
  Filled 2016-03-31 (×3): qty 100

## 2016-03-31 MED ORDER — EZETIMIBE 10 MG PO TABS: 10.0000 mg | ORAL_TABLET | Freq: Every day | ORAL | Status: DC

## 2016-03-31 MED ORDER — ROSUVASTATIN CALCIUM 20 MG PO TABS: 40.0000 mg | ORAL_TABLET | Freq: Every day | ORAL | Status: DC

## 2016-03-31 MED ORDER — FUROSEMIDE 10 MG/ML IJ SOLN
INTRAMUSCULAR | Status: AC
Start: 2016-03-31 — End: 2016-03-31
  Filled 2016-03-31: qty 4

## 2016-03-31 MED ORDER — SODIUM CHLORIDE 0.9% FLUSH: 3.0000 mL | Freq: Two times a day (BID) | INTRAVENOUS | Status: DC

## 2016-03-31 MED ORDER — CARVEDILOL 6.25 MG PO TABS
6.2500 mg | ORAL_TABLET | Freq: Two times a day (BID) | ORAL | Status: DC
  Administered 2016-03-31 (×2): 6.25 mg via ORAL
  Filled 2016-03-31 (×2): qty 1

## 2016-03-31 MED ORDER — HYDRALAZINE HCL 25 MG PO TABS
25.0000 mg | ORAL_TABLET | Freq: Three times a day (TID) | ORAL | Status: DC
  Administered 2016-03-31: 25 mg via ORAL
  Filled 2016-03-31: qty 1

## 2016-03-31 MED ORDER — OXYCODONE HCL 5 MG PO TABS: 5.0000 mg | ORAL_TABLET | ORAL | Status: DC | PRN

## 2016-03-31 MED ORDER — FUROSEMIDE 10 MG/ML IJ SOLN
40.0000 mg | Freq: Two times a day (BID) | INTRAMUSCULAR | Status: DC
Start: 2016-03-31 — End: 2016-03-31

## 2016-03-31 MED ORDER — SODIUM CHLORIDE 0.9% FLUSH: 3.0000 mL | INTRAVENOUS | Status: DC | PRN

## 2016-03-31 MED ORDER — POTASSIUM CHLORIDE 10 MEQ/100ML IV SOLN
10.0000 meq | Freq: Once | INTRAVENOUS | Status: AC
Start: 2016-03-31 — End: 2016-03-31
  Administered 2016-03-31: 10 meq via INTRAVENOUS
  Filled 2016-03-31: qty 100

## 2016-03-31 MED ORDER — SODIUM CHLORIDE 0.9 % IV SOLN: 250.0000 mL | INTRAVENOUS | Status: DC | PRN

## 2016-03-31 MED ORDER — FUROSEMIDE 10 MG/ML IJ SOLN
80.0000 mg | Freq: Once | INTRAMUSCULAR | Status: AC
  Administered 2016-03-31: 80 mg via INTRAVENOUS
  Filled 2016-03-31: qty 8

## 2016-03-31 MED ORDER — NITROGLYCERIN 2 % TD OINT
1.0000 [in_us] | TOPICAL_OINTMENT | Freq: Four times a day (QID) | TRANSDERMAL | Status: DC
  Administered 2016-03-31 (×2): 1 [in_us] via TOPICAL
  Filled 2016-03-31 (×2): qty 30

## 2016-03-31 MED ORDER — ONDANSETRON HCL 4 MG PO TABS: 4.0000 mg | ORAL_TABLET | Freq: Four times a day (QID) | ORAL | Status: DC | PRN

## 2016-03-31 MED ORDER — HYDROMORPHONE HCL 1 MG/ML IJ SOLN
0.5000 mg | Freq: Once | INTRAMUSCULAR | Status: AC
  Administered 2016-03-31: 0.5 mg via INTRAVENOUS
  Filled 2016-03-31: qty 1

## 2016-03-31 MED ORDER — POTASSIUM CHLORIDE CRYS ER 20 MEQ PO TBCR
40.0000 meq | EXTENDED_RELEASE_TABLET | Freq: Once | ORAL | Status: AC
  Administered 2016-03-31: 40 meq via ORAL
  Filled 2016-03-31: qty 2

## 2016-03-31 MED ORDER — IPRATROPIUM-ALBUTEROL 0.5-2.5 (3) MG/3ML IN SOLN
3.0000 mL | Freq: Four times a day (QID) | RESPIRATORY_TRACT | Status: DC
  Administered 2016-04-01: 3 mL via RESPIRATORY_TRACT
  Filled 2016-03-31: qty 3

## 2016-03-31 MED ORDER — FUROSEMIDE 10 MG/ML IJ SOLN
40.0000 mg | Freq: Once | INTRAMUSCULAR | Status: AC
  Administered 2016-03-31: 40 mg via INTRAVENOUS
  Filled 2016-03-31: qty 4

## 2016-03-31 MED ORDER — CETYLPYRIDINIUM CHLORIDE 0.05 % MT LIQD
7.0000 mL | Freq: Two times a day (BID) | OROMUCOSAL | Status: DC
  Administered 2016-03-31: 7 mL via OROMUCOSAL

## 2016-03-31 MED ORDER — ONDANSETRON HCL 4 MG/2ML IJ SOLN
4.0000 mg | Freq: Four times a day (QID) | INTRAMUSCULAR | Status: DC | PRN
  Administered 2016-03-31: 4 mg via INTRAVENOUS
  Filled 2016-03-31: qty 2

## 2016-03-31 MED ORDER — SODIUM CHLORIDE 0.9% FLUSH
3.0000 mL | Freq: Two times a day (BID) | INTRAVENOUS | Status: DC
  Administered 2016-03-31: 3 mL via INTRAVENOUS

## 2016-03-31 MED ORDER — ISOSORBIDE MONONITRATE ER 60 MG PO TB24: 90.0000 mg | ORAL_TABLET | Freq: Every day | ORAL | Status: DC

## 2016-03-31 MED ORDER — POTASSIUM CHLORIDE CRYS ER 20 MEQ PO TBCR: 60.0000 meq | EXTENDED_RELEASE_TABLET | Freq: Three times a day (TID) | ORAL | Status: DC

## 2016-03-31 MED ORDER — ACETAMINOPHEN 650 MG RE SUPP: 650.0000 mg | Freq: Four times a day (QID) | RECTAL | Status: DC | PRN

## 2016-03-31 MED ORDER — POTASSIUM CHLORIDE CRYS ER 20 MEQ PO TBCR
40.0000 meq | EXTENDED_RELEASE_TABLET | Freq: Four times a day (QID) | ORAL | Status: AC
  Administered 2016-03-31: 40 meq via ORAL
  Filled 2016-03-31: qty 2

## 2016-03-31 MED ORDER — MAGNESIUM SULFATE 2 GM/50ML IV SOLN
2.0000 g | Freq: Once | INTRAVENOUS | Status: AC
  Administered 2016-03-31: 2 g via INTRAVENOUS
  Filled 2016-03-31 (×2): qty 50

## 2016-03-31 MED ORDER — CLOPIDOGREL BISULFATE 75 MG PO TABS
75.0000 mg | ORAL_TABLET | Freq: Every day | ORAL | Status: DC
  Administered 2016-03-31: 75 mg via ORAL
  Filled 2016-03-31: qty 1

## 2016-03-31 MED ORDER — NITROGLYCERIN 0.4 MG SL SUBL: 0.4000 mg | SUBLINGUAL_TABLET | SUBLINGUAL | Status: DC | PRN

## 2016-03-31 MED ORDER — HYDROMORPHONE HCL 1 MG/ML IJ SOLN
0.5000 mg | INTRAMUSCULAR | Status: DC | PRN
  Administered 2016-03-31: 1 mg via INTRAVENOUS
  Administered 2016-03-31: 0.5 mg via INTRAVENOUS
  Filled 2016-03-31 (×2): qty 1

## 2016-03-31 MED ORDER — ENOXAPARIN SODIUM 30 MG/0.3ML ~~LOC~~ SOLN
30.0000 mg | SUBCUTANEOUS | Status: DC
  Administered 2016-03-31: 30 mg via SUBCUTANEOUS
  Filled 2016-03-31: qty 0.3

## 2016-03-31 MED ORDER — ACETAMINOPHEN 325 MG PO TABS: 650.0000 mg | ORAL_TABLET | Freq: Four times a day (QID) | ORAL | Status: DC | PRN

## 2016-03-31 MED ORDER — FUROSEMIDE 10 MG/ML IJ SOLN
80.0000 mg | Freq: Once | INTRAMUSCULAR | Status: AC
  Administered 2016-03-31: 80 mg via INTRAVENOUS

## 2016-03-31 MED ORDER — NITROGLYCERIN 2 % TD OINT
1.0000 [in_us] | TOPICAL_OINTMENT | Freq: Four times a day (QID) | TRANSDERMAL | Status: DC
  Administered 2016-03-31: 1 [in_us] via TOPICAL
  Filled 2016-03-31: qty 1

## 2016-03-31 MED ORDER — ASPIRIN EC 81 MG PO TBEC
81.0000 mg | DELAYED_RELEASE_TABLET | Freq: Every day | ORAL | Status: DC
  Administered 2016-03-31: 81 mg via ORAL
  Filled 2016-03-31: qty 1

## 2016-03-31 NOTE — Progress Notes (Signed)
Pt SpO2 dropped to 80's. Sat pt in chair position. O2 sat increased to 93%. Now resting comfortably.

## 2016-03-31 NOTE — Progress Notes (Addendum)
Patient seen and examined  65 y.o. female with a history of hx of CAD s/p CABG, HTN, diabetes, ischemic cardiomyopathy, combined systolic and diastolic CHF EF 123456 (echo 4/17), Recent dx of breast cancer (plan to start chemo) moderate to severe mitral regurgitation, PAD status post prior renal artery stenting, HL, carotid stenosis status post R CEA, CKD, bipolar affective disorder.Admitted 3/17 with acute on chronic combined systolic and diastolic CHF. Cardiac enzymes remained negative. She was diuresed with IV Lasix. Isosorbide was added to her medical regimen. Admitted 4/6 through 123456 with ADHF complicated by respiratory failure requiring short term intubation. Evaluated by palliative care and elected DNR. Recent diagnosis of breast cancer.Saw Dr. Lindi Adie. She states that she will not be undergoing chemotherapy for breast cancer. She does not feel she has breast cancer. She feels the titanium clip is causing most of her problems .She was referred to the [facility] by Dr. Radford Pax. Admitted 6/2 with complaints of substernal chest tightness that began around 8 PM. EKG suggestive of atrial fibrillation. Chest x-ray showed mild pulmonary vascular congestion. Sodium 127 potassium 2.4, creatinine 1.54, magnesium 1.9, first troponin 0.06.  Complaint of chest discomfort 6/3 and shortness of breath-cardiology consulted, transferred to [facility]. Discontinued IV fluids. Given Lasix 40 mg IV 1. Potassium and magnesium repleted. Currently not hypoxic or tachycardic therefore low suspicion for PE. Dr. Radford Pax consulted for evaluation of constant chest pain , discuss current plan of care with daughter-in-law

## 2016-03-31 NOTE — H&P (Signed)
[facility] Admission History and Physical       Penny [name] DOB: [birth year] DOA: 04/20/2016  Referring physician: EDP PCP: Lucretia Kern., DO  Specialists:   Chief Complaint: Chest Pain  HPI: Penny James is a 65 y.o. female with a history of Combined Systolic and Diastolic CHF, CAD, HTN, DM2, Hyperlipidemia, and Stage III CKD who presents to the ED with complaints of Chest pain described as Chest Tightness rated at a 7/10  Associated with SOB that started this evening around 8 PM while she was sitting watching TV.   She was evaluated in the ED and found to have Hypokalemia (2.4), along with a prolonged Q-T Interval.   She was started on Potassium replacement both oral and IV and referred for admission.      Review of Systems:    Constitutional: No Weight Loss, No Weight Gain, Night Sweats, Fevers, Chills, Dizziness, Light Headedness, Fatigue, or Generalized Weakness HEENT: No Headaches, Difficulty Swallowing,Tooth/Dental Problems,Sore Throat,  No Sneezing, Rhinitis, Ear Ache, Nasal Congestion, or Post Nasal Drip,  Cardio-vascular:  +Chest pain, Orthopnea, PND, Edema in Lower Extremities, Anasarca, Dizziness, Palpitations  Resp: +Dyspnea, No DOE, No Productive Cough, No Non-Productive Cough, No Hemoptysis, No Wheezing.    GI: No Heartburn, Indigestion, Abdominal Pain, Nausea, Vomiting, Diarrhea, Constipation, Hematemesis, Hematochezia, Melena, Change in Bowel Habits,  Loss of Appetite  GU: No Dysuria, No Change in Color of Urine, No Urgency or Urinary Frequency, No Flank pain.  Musculoskeletal: No Joint Pain or Swelling, No Decreased Range of Motion, No Back Pain.  Neurologic: No Syncope, No Seizures, Muscle Weakness, Paresthesia, Vision Disturbance or Loss, No Diplopia, No Vertigo, No Difficulty Walking,  Skin: No Rash or Lesions. Psych: No Change in Mood or Affect, No Depression or Anxiety, No Memory loss, No Confusion, or Hallucinations   Past  Medical History  Diagnosis Date  . Hyperlipidemia   . Arthritis   . Anemia   . Chronic combined systolic and diastolic CHF (congestive heart failure) ([place])     a. EF 55% by cath 2010 but in 09/2015 found to be 20%, with mod-severe MR, grade 2 DD.  . Bipolar affective disorder ([place])   . GERD (gastroesophageal reflux disease)   . Obesity   . Peripheral neuropathy ([place])   . 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 CAD (coronary artery disease)     a. CABG in 2002. b. Cath 09/2015: poor targets, not a good surgical candidate, high risk PCI reserved for recurrent symptoms.  . Carotid artery stenosis     60-79% bilateral ICA stenosis s/p Right CEA 2013  followed by Dr. Donnetta Hutching  . Mitral regurgitation     a. Mod-severe by echo 09/2015.  . Ischemic cardiomyopathy   . CKD (chronic kidney disease), stage III   . Breast cancer ([place])   . Elevated troponin 02/06/2016  . Ventricular tachycardia ([place]) 02/10/2016     Past Surgical History  Procedure Laterality Date  . Abdominal hysterectomy  1984  . Sp pta renal / visc  artery  2003    with stent of left renal artery  . Tonsillectomy      AS A CHILD  . Endarterectomy  02/18/2012    rocedure: ENDARTERECTOMY CAROTID;  Surgeon: Rosetta Posner, MD;  Location: [place];  Service: Vascular;  Laterality: Right;  Right  Carotid endarterectomy with Dacron patch angioplasty with resection of internal carotid artery  . Coronary artery bypass graft      3 vessel coronary disease S?P CABG with widely patent grafts by cath in 2005 and distal disease past the graft insertion sites.  . Cardiac catheterization  03/25/09    Occluded SVG to intermediate and SVG to OM/posterior descending artery with PTCA left circ 6/10 s/p PTCA stent in SVG to OM/PDA, 07/2002 s/p PCI of left circ 03/2009  . Cardiac catheterization N/A 10/05/2015     Procedure: Left Heart Cath and Cors/Grafts Angiography;  Surgeon: Troy Sine, MD;  Location: [place] [facility];  Service: Cardiovascular;  Laterality: N/A;      Prior to Admission medications   Medication Sig Start Date End Date Taking? Authorizing Provider  aspirin EC 81 MG tablet Take 81 mg by mouth daily.   Yes Historical Provider, MD  BAYER MICROLET LANCETS lancets Use as instructed 08/19/15  Yes Lucretia Kern, DO  benzonatate (TESSALON PERLES) 100 MG capsule Take 1 capsule (100 mg total) by mouth 3 (three) times daily as needed for cough. 03/23/16  Yes Lucretia Kern, DO  chlorpheniramine-HYDROcodone (TUSSIONEX) 10-8 MG/5ML SUER Take 5 mLs by mouth at bedtime. 02/27/16  Yes Hannah R Kim, DO  COREG 6.25 MG tablet TAKE 1 TABLET BY MOUTH TWICE A DAY WITH A MEAL 03/12/16  Yes Jolaine Artist, MD  ezetimibe (ZETIA) 10 MG tablet Take 1 tablet (10 mg total) by mouth daily. 03/08/16  Yes Jolaine Artist, MD  Glucose Blood (BAYER BREEZE 2 TEST) DISK Test blood sugar four times a day 08/19/15  Yes Lucretia Kern, DO  hydrALAZINE (APRESOLINE) 25 MG tablet Take 1 tablet (25 mg total) by mouth every 8 (eight) hours. 03/08/16  Yes Jolaine Artist, MD  isosorbide mononitrate (IMDUR) 30 MG 24 hr tablet Take 3 tablets (90 mg total) by mouth daily. 02/11/16  Yes Donne Hazel, MD  LASIX 40 MG tablet TAKE 1 TABLET BY MOUTH EVERY DAY _GAVE 3NC_ 03/12/16  Yes Jolaine Artist, MD  nitroGLYCERIN (NITROSTAT) 0.4 MG SL tablet Place 0.4 mg under the tongue every 5 (five) minutes as needed for chest pain. MAY TAKE UP TO 3 DOSES BEFORE CALLING  EMS   Yes Historical Provider, MD  olopatadine (PATANOL) 0.1 % ophthalmic solution Place 1 drop into both eyes 2 (two) times daily as needed for allergies.   Yes Historical Provider, MD  PLAVIX 75 MG tablet TAKE 1 TABLET BY MOUTH EVERY DAY 12/19/15  Yes Sueanne Margarita, MD  rosuvastatin (CRESTOR) 40 MG tablet Take 1 tablet (40 mg total) by mouth daily. 03/08/16  Yes Jolaine Artist, MD  amiodarone (PACERONE) 200 MG tablet Take 1 tablet (200 mg total) by mouth daily. 03/02/16 03/16/16  Rhonda G Barrett, PA-C     Allergies  Allergen Reactions  . Penicillins Rash    Has patient had a PCN reaction causing immediate rash, facial/tongue/throat swelling, SOB or lightheadedness with hypotension: Yes Has patient had a PCN reaction causing severe rash involving mucus membranes or skin necrosis: Yes Has patient had a PCN reaction that required hospitalization Yes- admitted to hospital Has patient had a PCN reaction occurring within the last 10 years: No, childhood allergy If all of the above answers are "NO", then may proceed with Cephalosporin use.   . Protamine     Severe hypotension Internal bleeding around heart  . Amlodipine Swelling    Causes  swelling in higher dosage    Social History:  reports that she has never smoked. She has never used smokeless tobacco. She reports that she does not drink alcohol or use illicit drugs.    Family History  Problem Relation Age of Onset  . Heart disease Brother     Heart Disease before age 32  . Glaucoma Father   . Hypertension Father   . Stroke Paternal Grandmother   . Coronary artery disease Paternal Grandmother   . Cerebral aneurysm Brother   . Clotting disorder Sister   . Heart attack Neg Hx   . Stroke Father        Physical Exam:  GEN: Pleasant Elderly Obese  65 y.o. African American female examined and in no acute distress; cooperative with exam Filed Vitals:    0115  0130  0145  0200  BP: 138/63 142/69 138/67 139/68  Pulse: 61 65 64 63  Temp:      TempSrc:      Resp: 13 15 20 21   Height:      Weight:      SpO2: 100% 100% 98% 100%   Blood pressure 139/68, pulse 63, temperature 98 F (36.7 C), temperature source Oral, resp. rate 21, height 5\' 3"  (1.6 m), weight 67.586 kg (149 lb), SpO2 100 %. PSYCH: She is alert and oriented x4; does not appear anxious does not  appear depressed; affect is normal HEENT: Normocephalic and Atraumatic, Mucous membranes pink; PERRLA; EOM intact; Fundi:  Benign;  No scleral icterus, Nares: Patent, Oropharynx: Clear,    Neck:  FROM, No Cervical Lymphadenopathy nor Thyromegaly or Carotid Bruit; No JVD; Breasts:: Not examined CHEST WALL: No tenderness CHEST: Normal respiration, clear to auscultation bilaterally HEART: Iregular rate and rhythm; no murmurs rubs or gallops BACK: No kyphosis or scoliosis; No CVA tenderness ABDOMEN: Positive Bowel Sounds,  Obese, Soft Non-Tender, No Rebound or Guarding; No Masses, No Organomegaly, No Pannus; No Intertriginous candida. Rectal Exam: Not done EXTREMITIES: No Cyanosis, Clubbing or Edema Genitalia: not examined PULSES: 2+ and symmetric SKIN: Normal hydration no rash or ulceration CNS:  Alert and Oriented x 4, No Focal Deficits Vascular: pulses palpable throughout    Labs on Admission:  Basic Metabolic Panel:  Recent Labs Lab 03/29/2016 2319  NA 127*  K 2.4*  CL 87*  CO2 28  GLUCOSE 92  BUN 16  CREATININE 1.54*  CALCIUM 9.2   Liver Function Tests: No results for input(s): AST, ALT, ALKPHOS, BILITOT, PROT, ALBUMIN in the last 168 hours. No results for input(s): LIPASE, AMYLASE in the last 168 hours. No results for input(s): AMMONIA in the last 168 hours. CBC:  Recent Labs Lab 04/07/2016 2319  WBC 2.9*  HGB 9.9*  HCT 29.5*  MCV 81.0  PLT 154   Cardiac Enzymes: No results for input(s): CKTOTAL, CKMB, CKMBINDEX, TROPONINI in the last 168 hours.  BNP (last 3 results)  Recent Labs  12/30/15 0345 02/02/16 0700 03/09/16 1834  BNP 1975.5* 1635.8* 2181.6*    ProBNP (last 3 results)  Recent Labs  04/01/15 0738  PROBNP 460.0*    CBG: No results for input(s): GLUCAP in the last 168 hours.  Radiological Exams on Admission: Dg Chest 2 View    CLINICAL DATA:  Central chest pain and shortness of breath since yesterday EXAM: CHEST  2 VIEW  COMPARISON:  03/09/2016 FINDINGS: Chronic cardiomegaly. The patient is status post CABG. Stable aortic and hilar contours. Trace pleural effusions.  No edema or pneumonia.  No  pneumothorax. IMPRESSION: Chronic cardiomegaly.  Trace effusions without pulmonary edema. Electronically Signed   By: Monte Fantasia M.D.   On:  00:56     EKG: Independently reviewed. Normal Sinus Rhythm rate = 62 + LBBB    Assessment/Plan:   65 y.o. female with  Active Problems:    Hypokalemia- due to Diuretic Rx   Replace K+ with IV and PO    Hold Next dose of Lasix   Check Magnesium Level   Monitor K+ Levels      Chest pain   Crdiac Monitoring   Cycle  Troponins   Nitropaste, O2, ASA, Continue Carvedilol, Plavix and Zetia Rx        Prolonged Q-T interval on ECG   Hold Amiodarone Rx   Check Amiodarone Level    CAD (coronary artery disease)   On Imdur, Carvedilol, Plavix and Zetia Rx     Benign essential HTN   On Hydralazine, Imdur, and Carvedilol Rx   Monitor BPs      DM (diabetes mellitus) type II controlled peripheral vascular disorder (HCC)   Continue Lantus Rx   SSI coverage PRN   Check HbA1C      Pancytopenia (HCC)   Chronic    Monitor Trend      Chronic combined systolic and diastolic CHF, NYHA class 3 (HCC)   Holding Next dose of Lasix   continue Carvedilol, Hydralazine, and resume Imdur when Nitropaste is Discontinued          CKD (chronic kidney disease), stage III   Monitor BUN/CR       DVT Prophylaxis   Lovenox    Code Status:    DO NOT RESUSCITATE (DNR)      Family Communication:   No Family Present    Disposition Plan:    Observation Status        Time spent: 44 Minutes      Theressa Millard [facility] Pager [phone]   If 7AM -7PM Please Contact the Day Rounding Team MD for [facility]  If 7PM-7AM, Please Contact Night-Floor Coverage  [email or web address] Password [email or web address] , 2:40 AM     ADDENDUM:   Patient was seen and  examined on

## 2016-03-31 NOTE — Consult Note (Signed)
Reason for Consult:respiratory distress  Referring Physician: Hospitalist  Penny James is an 65 y.o. female.   HPI: The patient is a 65 yo woman with chronic systolic heart failure, an ICM, Breast CA, MR, DM, and carotid vascular disease who developed worsening sob earlier this morning. She is DNR. She was transferred to the ICU for additional treatment. She is on a non-rebreather and appears ill but is not currently in respiratory distress. She denies anginal symptoms. She has marked LBBB on ECG with a calculated QRS duration by me of almost 200 ms (computer reads as 140). CXR has been reviewed as has her echo from a month ago. At the time she did not have a pericardial effusion. CXR shows pulmonary infiltrates c/w pulm vascular congestion.   PMH: Past Medical History  Diagnosis Date  . Hyperlipidemia   . Arthritis   . Anemia   . Chronic combined systolic and diastolic CHF (congestive heart failure) ([place])     a. EF 55% by cath 2010 but in 09/2015 found to be 20%, with mod-severe MR, grade 2 DD.  . Bipolar affective disorder ([place])   . GERD (gastroesophageal reflux disease)   . Obesity   . Peripheral neuropathy ([place])   . 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 CAD (coronary artery disease)     a. CABG in 2002. b. Cath 09/2015: poor targets, not a good surgical candidate, high risk PCI reserved for recurrent symptoms.  . Carotid artery stenosis     60-79% bilateral ICA stenosis s/p Right CEA 2013  followed by Dr. Donnetta Hutching  . Mitral regurgitation     a. Mod-severe by echo 09/2015.  . Ischemic cardiomyopathy   . CKD (chronic kidney disease), stage III   . Breast cancer ([place])   . Elevated troponin 02/06/2016  . Ventricular tachycardia ([place]) 02/10/2016    PSHX: Past Surgical History  Procedure Laterality Date  . Abdominal hysterectomy   1984  . Sp pta renal / visc  artery  2003    with stent of left renal artery  . Tonsillectomy      AS A CHILD  . Endarterectomy  02/18/2012    rocedure: ENDARTERECTOMY CAROTID;  Surgeon: Rosetta Posner, MD;  Location: [facility] OR;  Service: Vascular;  Laterality: Right;  Right Carotid endarterectomy with Dacron patch angioplasty with resection of internal carotid artery  . Coronary artery bypass graft      3 vessel coronary disease S?P CABG with widely patent grafts by cath in 2005 and distal disease past the graft insertion sites.  . Cardiac catheterization  03/25/09    Occluded SVG to intermediate and SVG to OM/posterior descending artery with PTCA left circ 6/10 s/p PTCA stent in SVG to OM/PDA, 07/2002 s/p PCI of left circ 03/2009  . Cardiac catheterization N/A 10/05/2015    Procedure: Left Heart Cath and Cors/Grafts Angiography;  Surgeon: Troy Sine, MD;  Location: [place] [facility];  Service: Cardiovascular;  Laterality: N/A;    FAMHX: Family History  Problem Relation Age of Onset  . Heart disease Brother     Heart Disease before age 38  . Glaucoma Father   . Hypertension Father   . Stroke Paternal Grandmother   . Coronary artery disease Paternal Grandmother   . Cerebral aneurysm Brother   . Clotting disorder Sister   .  Heart attack Neg Hx   . Stroke Father     Social History:  reports that she has never smoked. She has never used smokeless tobacco. She reports that she does not drink alcohol or use illicit drugs.  Allergies:  Allergies  Allergen Reactions  . Penicillins Rash    Has patient had a PCN reaction causing immediate rash, facial/tongue/throat swelling, SOB or lightheadedness with hypotension: Yes Has patient had a PCN reaction causing severe rash involving mucus membranes or skin necrosis: Yes Has patient had a PCN reaction that required hospitalization Yes- admitted to hospital Has patient had a PCN reaction occurring within the last 10 years: No, childhood  allergy If all of the above answers are "NO", then may proceed with Cephalosporin use.   . Protamine     Severe hypotension Internal bleeding around heart  . Amlodipine Swelling    Causes swelling in higher dosage    Medications: I have reviewed the patient's current medications.  Dg Chest 2 View    CLINICAL DATA:  Central chest pain and shortness of breath since yesterday EXAM: CHEST  2 VIEW COMPARISON:  03/09/2016 FINDINGS: Chronic cardiomegaly. The patient is status post CABG. Stable aortic and hilar contours. Trace pleural effusions.  No edema or pneumonia.  No pneumothorax. IMPRESSION: Chronic cardiomegaly.  Trace effusions without pulmonary edema. Electronically Signed   By: Monte Fantasia M.D.   On:  00:56   Dg Chest Port 1 View    CLINICAL DATA:  Severe mitral regurgitation, CHF, shortness of breath EXAM: PORTABLE CHEST 1 VIEW COMPARISON:   FINDINGS: Marked cardiomegaly with diffuse increased erihilar and basilar opacities compatible with acute CHF. Small pleural effusions noted. Negative for pneumothorax. Trachea remains midline. Atherosclerosis of the aorta evident. Degenerative changes of the spine and shoulders. IMPRESSION: Acute CHF pattern compared to earlier this morning. Electronically Signed   By: Jerilynn Mages.  Shick M.D.   On:  09:33    ROS  As stated in the HPI and negative for all other systems.  Physical Exam  Vitals:Blood pressure 123/68, pulse 70, temperature 92 F (33.3 C), temperature source Oral, resp. rate 27, height 5\' 3"  (1.6 m), weight 141 lb 11.2 oz (64.275 kg), SpO2 94 %.  Chronically ill appearing NAD HEENT: Unremarkable Neck:  8 cm JVD, no thyromegally Lymphatics:  No adenopathy Back:  No CVA tenderness Lungs:  Rales about half way up, bilaterally HEART:  Regular rate rhythm, no murmurs, no rubs, no clicks, split s2 Abd:  soft, positive bowel sounds, no organomegally, no rebound, no guarding Ext:  2 plus  pulses, no edema, no cyanosis, no clubbing Skin:  No rashes no nodules Neuro:  CN II through XII intact, motor grossly intact  ECG - NSR with LBBB CXR - pulmonary edema  Assessment/Plan: 1. Acute on chronic systolic heart failure - she has evidence of acute volume overload. Will give IV lasix. Will check co-Ox. Her prognosis is poor. 2. Ischemic cardiomyopathy - she denies anginal symptoms and has no ECG evidence of ischemia in the setting of LBBB 3. Breast CA - note her denial of her illness and refusal of treatment. It would be appropriate to consider hospice, especially if she does not improve rapidly with treatment of her CHF.  4. VT - documented form April 2017 but not well characterized. Will follow on tele.   Gregg Taylor,M.D.   Carleene Overlie TaylorMD , 12:02 PM

## 2016-03-31 NOTE — Progress Notes (Signed)
Patient ID: Penny James, female   DOB: 1950/11/12, 65 y.o.   MRN: [id number]; Pt complains of increased shortness of breath.  Pt has a chf. Pt is on non rebreather at 100%.  RN reports 02 decreased from 93 to 82-83%  Vitals increased resp rate to 26.  02 sats 83%  Lungs rales and rhonchi. Heart RRR 70's   Pt placed on bipap, Iv lasix ordered, duoneb ordered. Pt had improvement with bipap.02 sat at 94%.  Pt much more comfortable.  Portable chest xray ordered.

## 2016-03-31 NOTE — ED Notes (Signed)
Dr Arnoldo Morale gave verbal order for 0.5mg  dilaudid.

## 2016-03-31 NOTE — Progress Notes (Signed)
Dr Allyson Sabal called Patient c/o chest pressure sob sweating DR Allyson Sabal will call back with orders to transfer to [facility]   .

## 2016-04-01 ENCOUNTER — Observation Stay (HOSPITAL_COMMUNITY): Payer: Medicare Other

## 2016-04-01 DIAGNOSIS — I5042 Chronic combined systolic (congestive) and diastolic (congestive) heart failure: Secondary | ICD-10-CM | POA: Diagnosis not present

## 2016-04-02 ENCOUNTER — Telehealth: Payer: Self-pay | Admitting: Family Medicine

## 2016-04-02 LAB — AMIODARONE LEVEL
AMIODARONE LVL: 1.6 ug/mL (ref 1.0–2.5)
N-Desethyl-Amiodarone: 0.9 ug/mL — ABNORMAL LOW (ref 1.0–2.5)

## 2016-04-02 NOTE — Telephone Encounter (Signed)
Spoke with donnie. Gave condolences. HE said she was ready and is in a better place now. He will be here tomorrow for pnv.

## 2016-04-02 NOTE — Telephone Encounter (Signed)
fyi pt son is calling to let md know his mother has passed away

## 2016-04-09 ENCOUNTER — Encounter (HOSPITAL_COMMUNITY): Payer: Medicare Other

## 2016-04-10 ENCOUNTER — Other Ambulatory Visit (HOSPITAL_COMMUNITY): Payer: Self-pay | Admitting: Internal Medicine

## 2016-04-11 ENCOUNTER — Ambulatory Visit: Payer: Medicare Other | Admitting: Cardiology

## 2016-04-12 ENCOUNTER — Encounter (HOSPITAL_COMMUNITY): Payer: Medicare Other | Admitting: Internal Medicine

## 2016-04-28 NOTE — Progress Notes (Signed)
Pt HR dropped to 30's. During attempt to notify MD pt went asystole. No life-saving measures were taken because pt was DNR. Sister, Sol Blazing and husband, Debbe Bales were contacted. Message was left with the son, Donnie.

## 2016-04-28 NOTE — Progress Notes (Signed)
Pt refusing BiPap. Placed on 100% NRB.

## 2016-04-28 NOTE — Progress Notes (Signed)
Pt feels suffocated. Hunching over to help her breathing. Paged [facility]. PA visited and ordered IV lasix and BiPap. Respiratory placed. Patient resting more comfortably. SpO2 100%.

## 2016-04-28 NOTE — Progress Notes (Signed)
Patient ID: Penny James, female   DOB: [birth year], 65 y.o.   MRN: [id number]  Rn called to report pt had decreased heart rate and passed.  RN has called family to advise.

## 2016-04-28 NOTE — Discharge Summary (Signed)
Death Summary  Penny James [id number] DOB: 1951/08/17 DOA: 04/22/2016  PCP: Lucretia Kern., DO PCP/Office notified: Message sent.  Admit date: April 22, 2016 Date of Death: 24-Apr-2016  Final Diagnoses:  Active Problems:   Benign essential HTN   DM (diabetes mellitus) type II controlled peripheral vascular disorder (HCC)   Hypokalemia   Pancytopenia (HCC)   Chest pain   Chronic combined systolic and diastolic CHF, NYHA class 3 (HCC)   CKD (chronic kidney disease), stage III   Prolonged Q-T interval on ECG   CAD (coronary artery disease)  1. Acute heart failure exacerbation 2. Breast cancer, recent diagnosis, no treatment  History of present illness:  Penny James is a 65 y.o. female with a history of Combined Systolic and Diastolic CHF, CAD, HTN, DM2, Hyperlipidemia, and Stage III CKD who presents to the ED with complaints of Chest pain described as Chest Tightness rated at a 7/10 Associated with SOB that started this evening around 8 PM while she was sitting watching TV. She was evaluated in the ED and found to have Hypokalemia (2.4), along with a prolonged Q-T Interval. She was started on Potassium replacement both oral and IV and referred for admission.  Hospital Course:  65 y.o. female with a history of hx of CAD s/p CABG, HTN, diabetes, ischemic cardiomyopathy, combined systolic and diastolic CHF EF 123456 (echo 4/17), Recent dx of breast cancer (plan to start chemo) moderate to severe mitral regurgitation, PAD status post prior renal artery stenting, HL, carotid stenosis status post R CEA, CKD, bipolar affective disorder.Admitted 3/17 with acute on chronic combined systolic and diastolic CHF. Cardiac enzymes remained negative. She was diuresed with IV Lasix. Isosorbide was added to her medical regimen. Admitted 4/6 through 123456 with ADHF complicated by respiratory failure requiring short term intubation. Evaluated by palliative care and elected DNR. Recent diagnosis of  breast cancer.Saw Dr. Lindi Adie. She states that she will not be undergoing chemotherapy for breast cancer. She does not feel she has breast cancer. She feels the titanium clip is causing most of her problems .She was referred to the HF Clinic by Dr. Radford Pax. Admitted 04/23/2023 with complaints of substernal chest tightness that began around 8 PM. EKG suggestive of atrial fibrillation. Chest x-ray showed mild pulmonary vascular congestion. Sodium 127 potassium 2.4, creatinine 1.54, magnesium 1.9, first troponin 0.06.  Complaint of chest discomfort 6/3 and shortness of breath-cardiology consulted, transferred to [facility]. Discontinued IV fluids. Given Lasix 40 mg IV 1. Potassium and magnesium repleted. Currently not hypoxic or tachycardic therefore low suspicion for PE. Dr. Lovena Le consulted for evaluation of constant chest pain , discuss current plan of care with daughter-in-law. He recommended IV lasix and noted poor prognosis given degree of heart failure, and he did recommend discussion with hospice as well.   Patient was noted to become more tachypneic and bradycardic early this AM and was placed on 100% NRB, refused Bipap. Despite interventions she became more bradycardic and passed away comfortably.  Time of death 72.   Time: 20 minutes  Signed:  [employer]  [facility] April 24, 2016, 8:15 AM

## 2016-04-28 DEATH — deceased

## 2016-05-07 ENCOUNTER — Ambulatory Visit: Payer: Medicare Other | Admitting: Family Medicine

## 2016-05-10 ENCOUNTER — Telehealth: Payer: Self-pay | Admitting: *Deleted

## 2016-05-10 NOTE — Telephone Encounter (Signed)
Dr Maudie Mercury received a fax from [facility] to be signed for the pt.  Per Dr Maudie Mercury I called [facility] and informed Gweneth Dimitri  Dr Maudie Mercury stated she cannot sign the forms as PT was ordered by the cardiologist.

## 2016-05-29 ENCOUNTER — Ambulatory Visit: Payer: Medicare Other | Admitting: Family Medicine

## 2016-06-20 ENCOUNTER — Ambulatory Visit: Payer: Medicare Other | Admitting: Cardiology

## 2017-03-12 IMAGING — CR DG CHEST 2V
2 series · 2 of 2 positions shown · non-contrast
Comparison: 09/09/2015

CLINICAL DATA: Shortness of breath

EXAM:
CHEST  2 VIEW

[w chest lat]
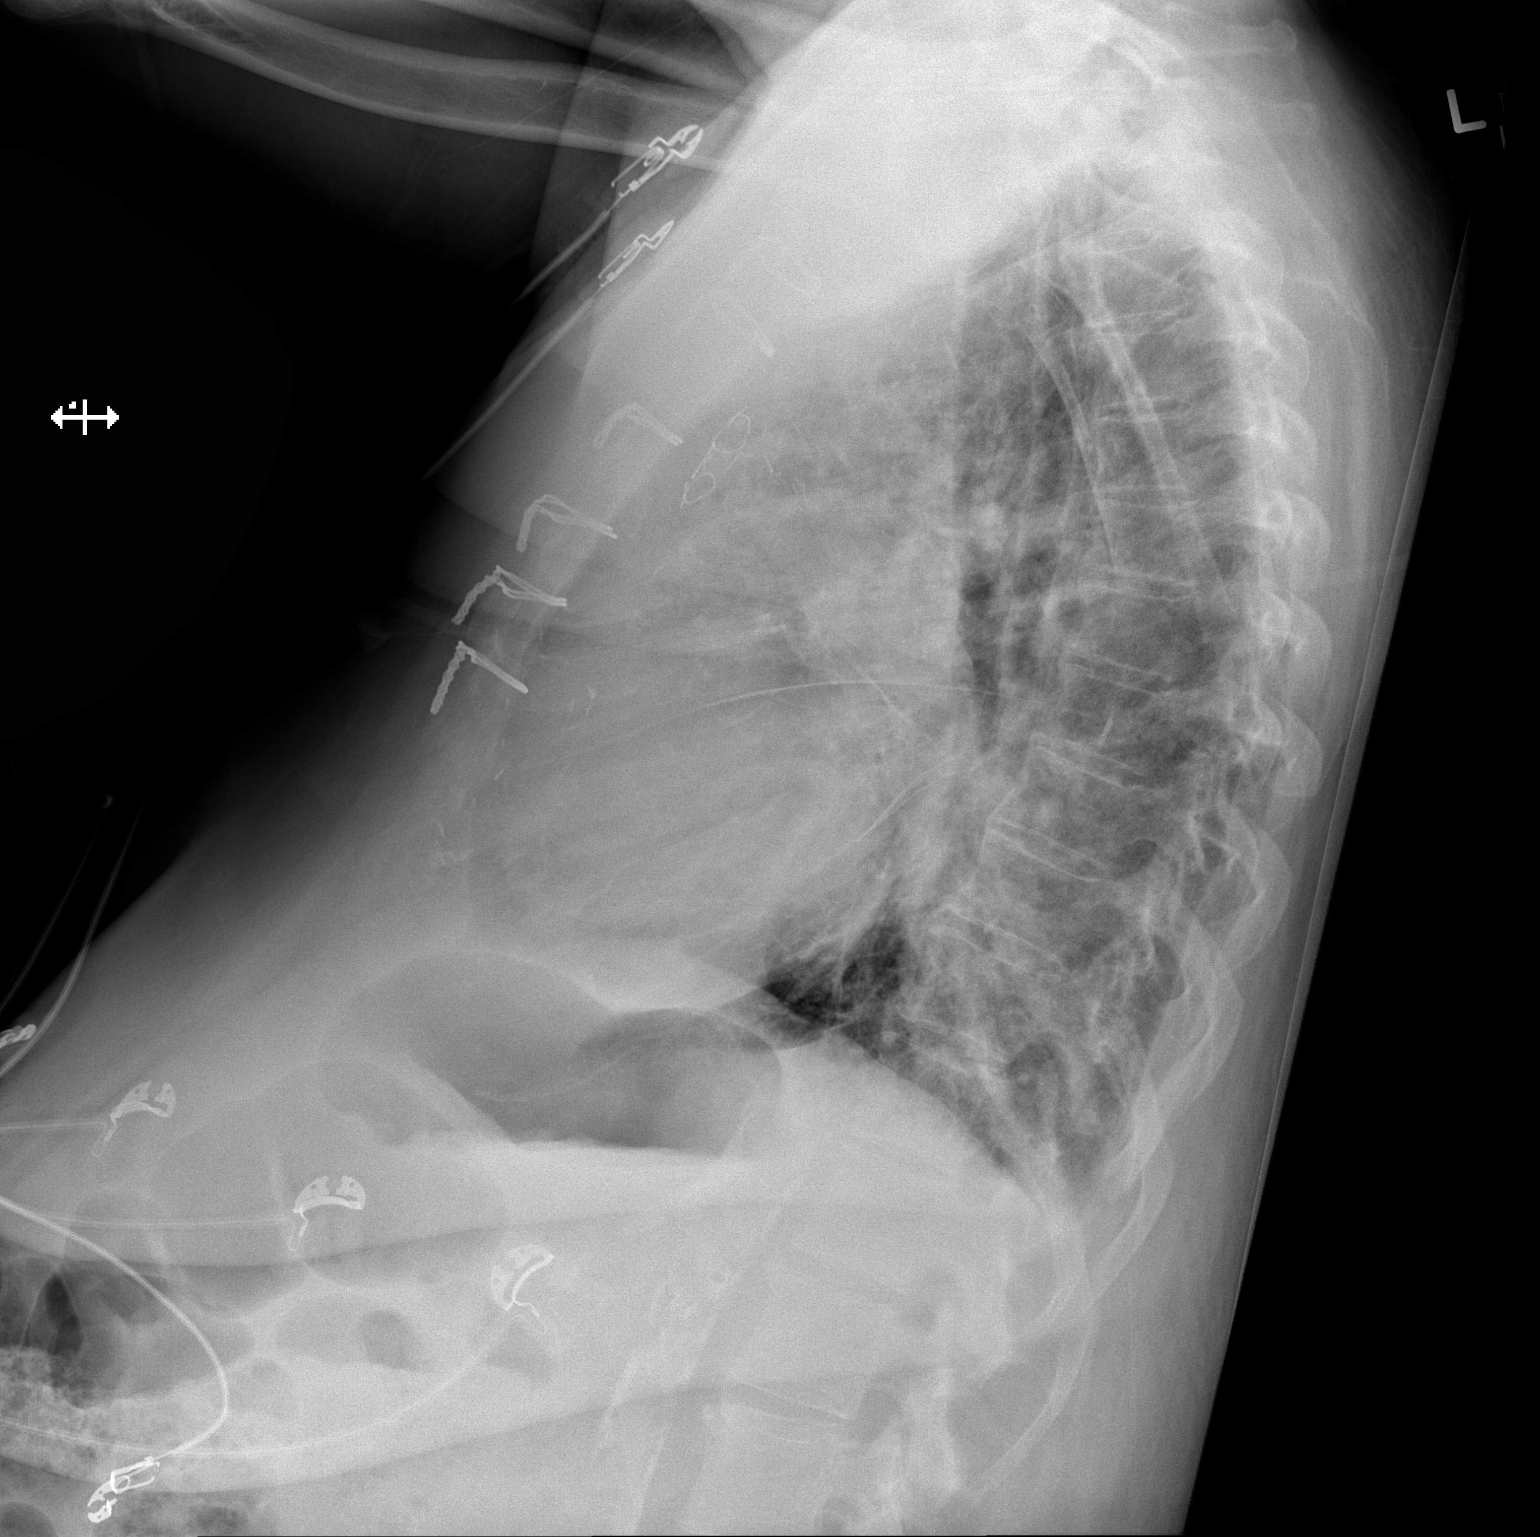

[x chest ap]
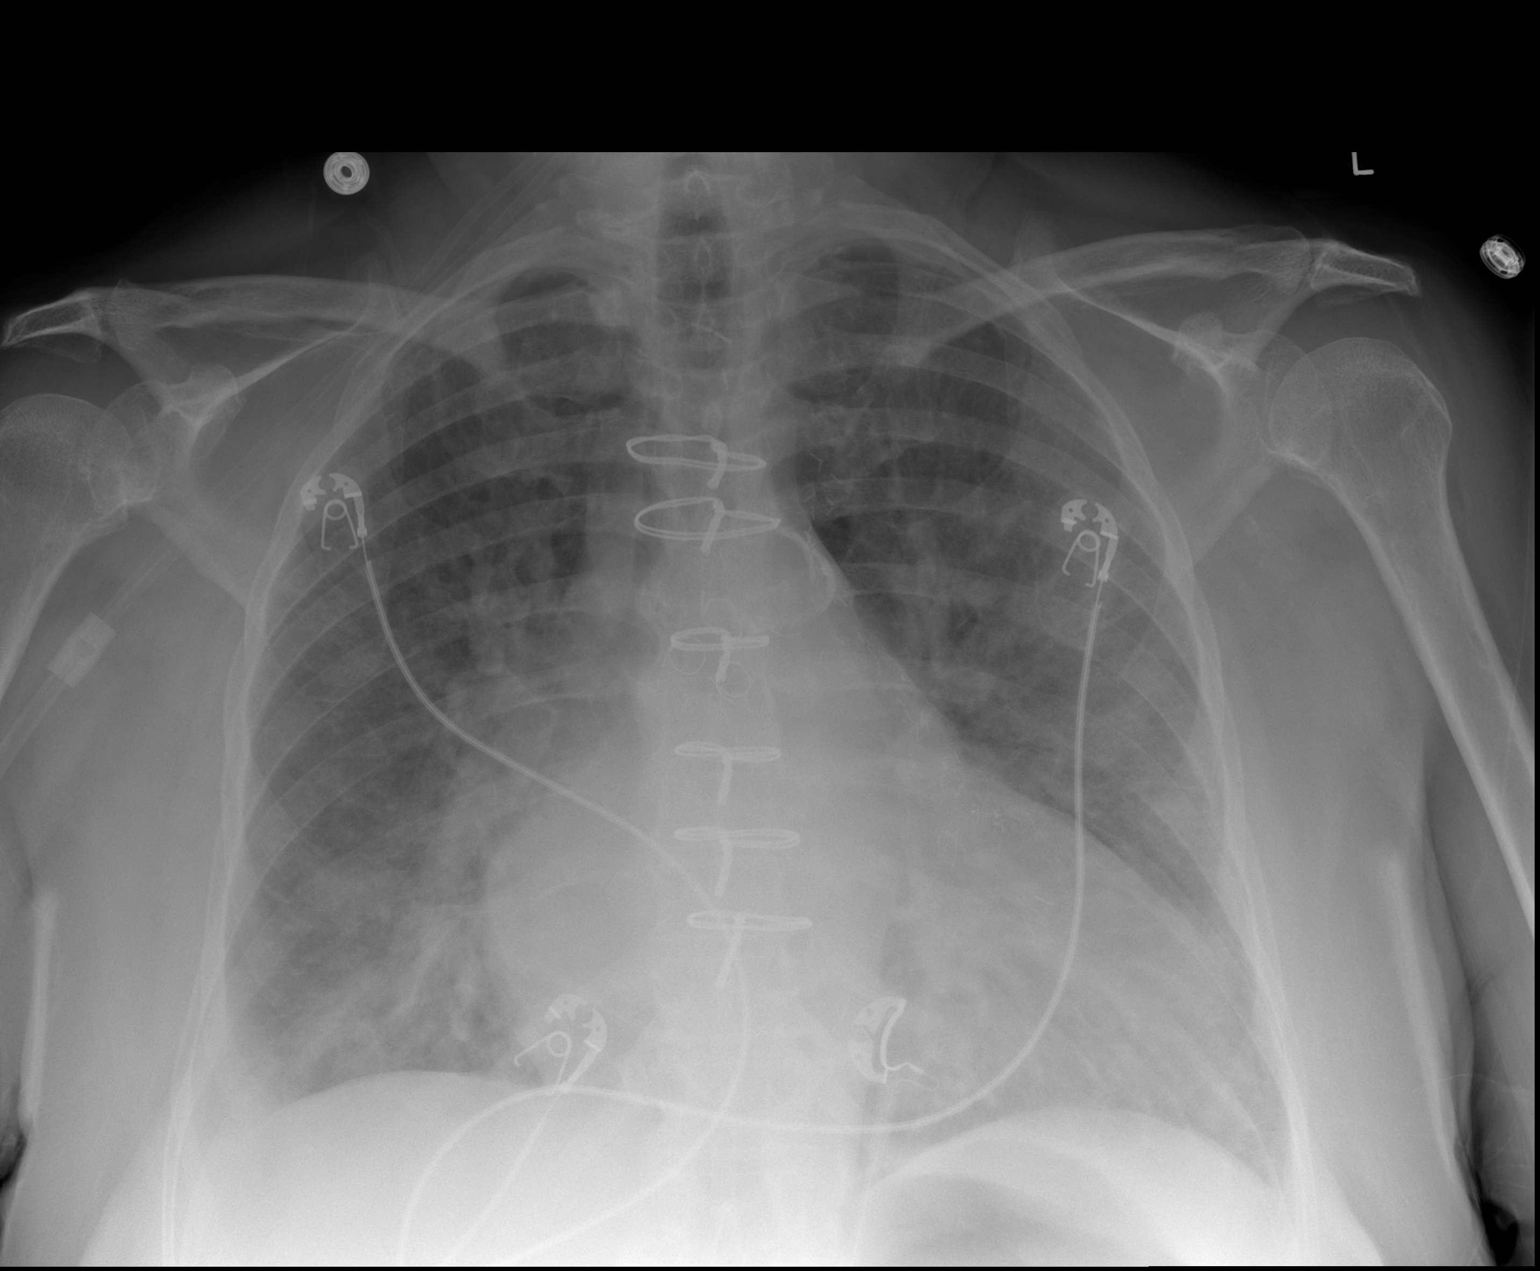

[2 of 2 positions shown; findings below may reference images not displayed]

FINDINGS: Chronic cardiopericardial enlargement. Stable aortic and hilar
contours. The patient is status post CABG. There is diffuse
interstitial and airspace opacity with small right pleural effusion.
IMPRESSION: CHF pattern.

## 2017-06-09 IMAGING — DX DG CHEST 2V
2 series · 2 of 2 positions shown · non-contrast
Comparison: Chest radiograph performed 10/03/2015

CLINICAL DATA: Acute onset of shortness of breath. Initial
encounter.

EXAM:
CHEST  2 VIEW

[chest pa]
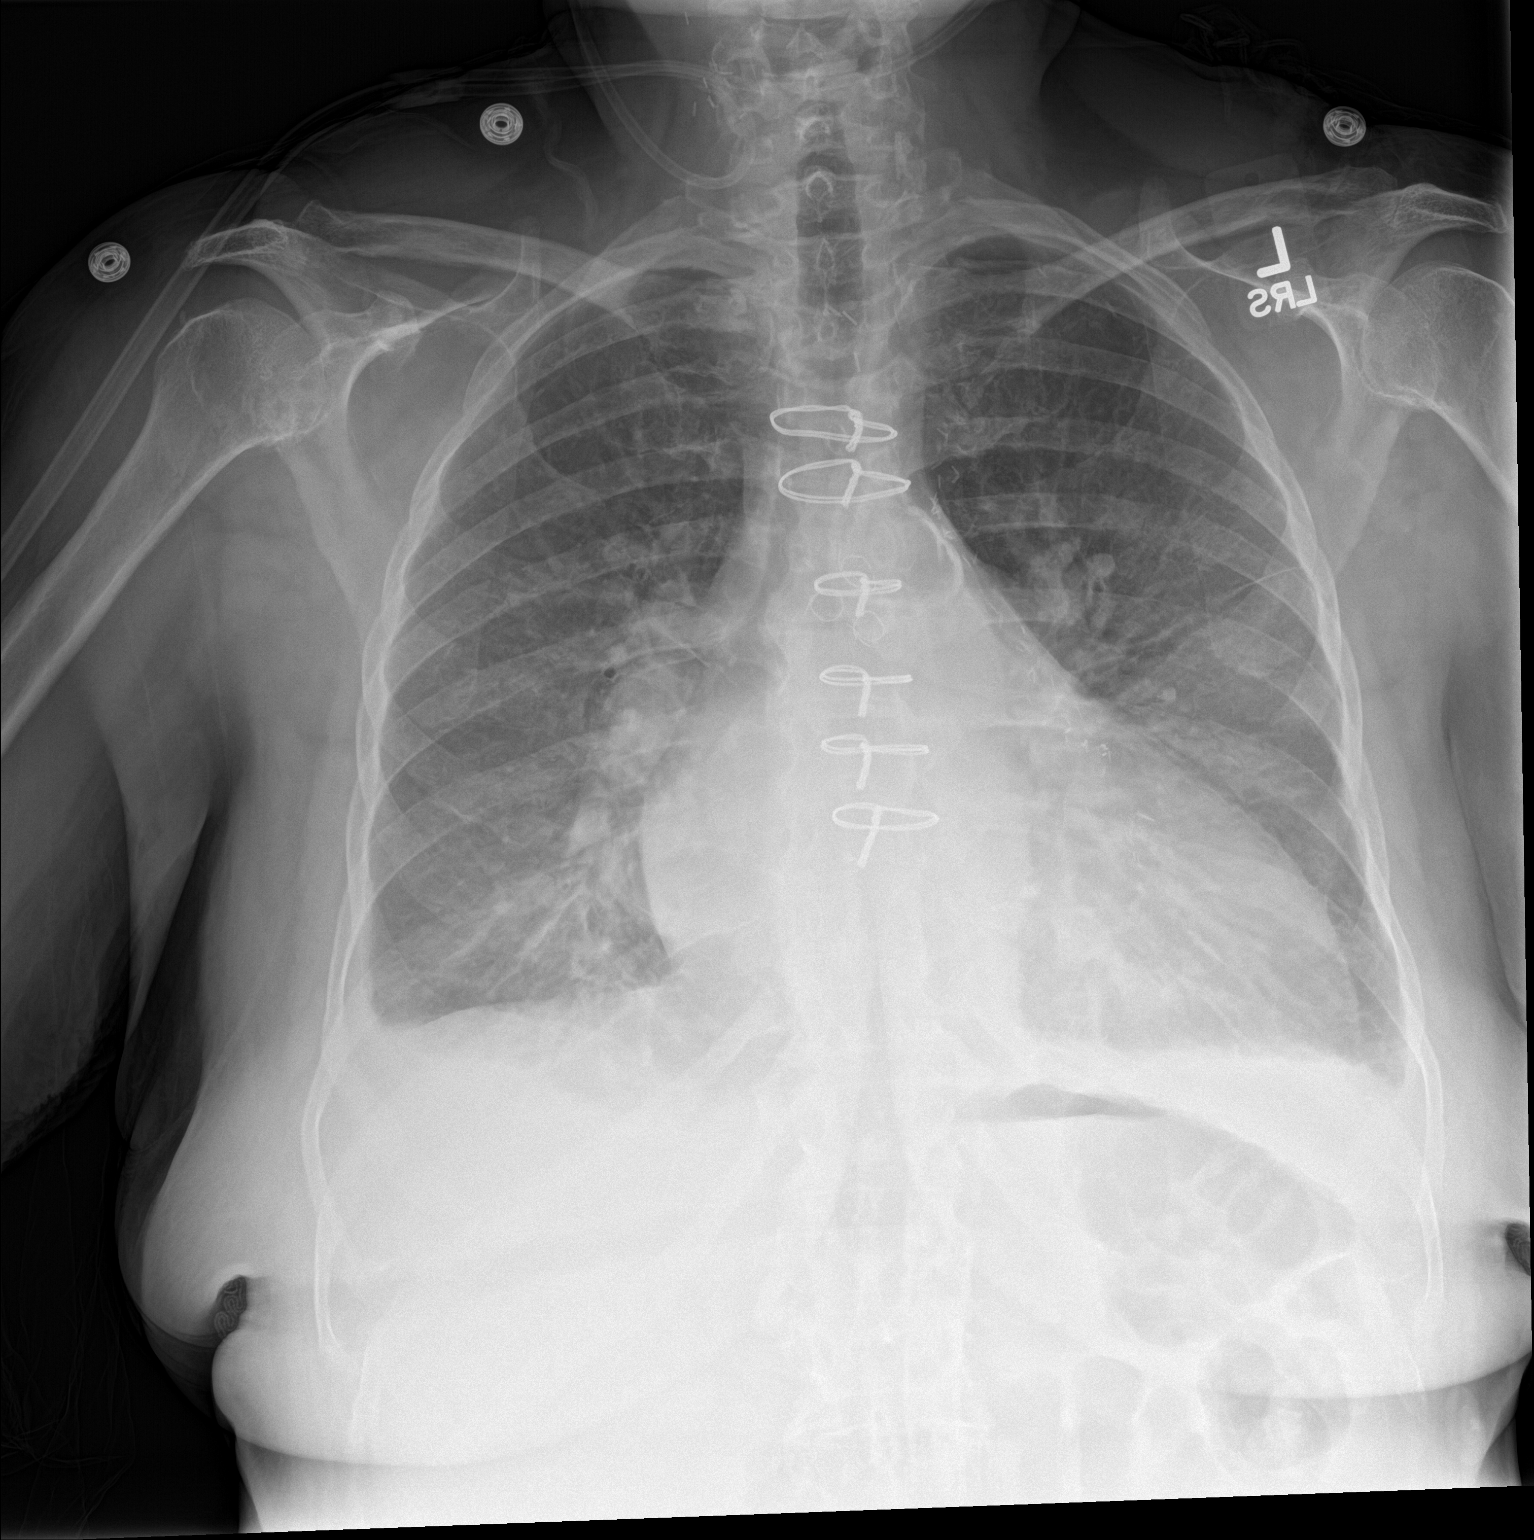

[chest lat]
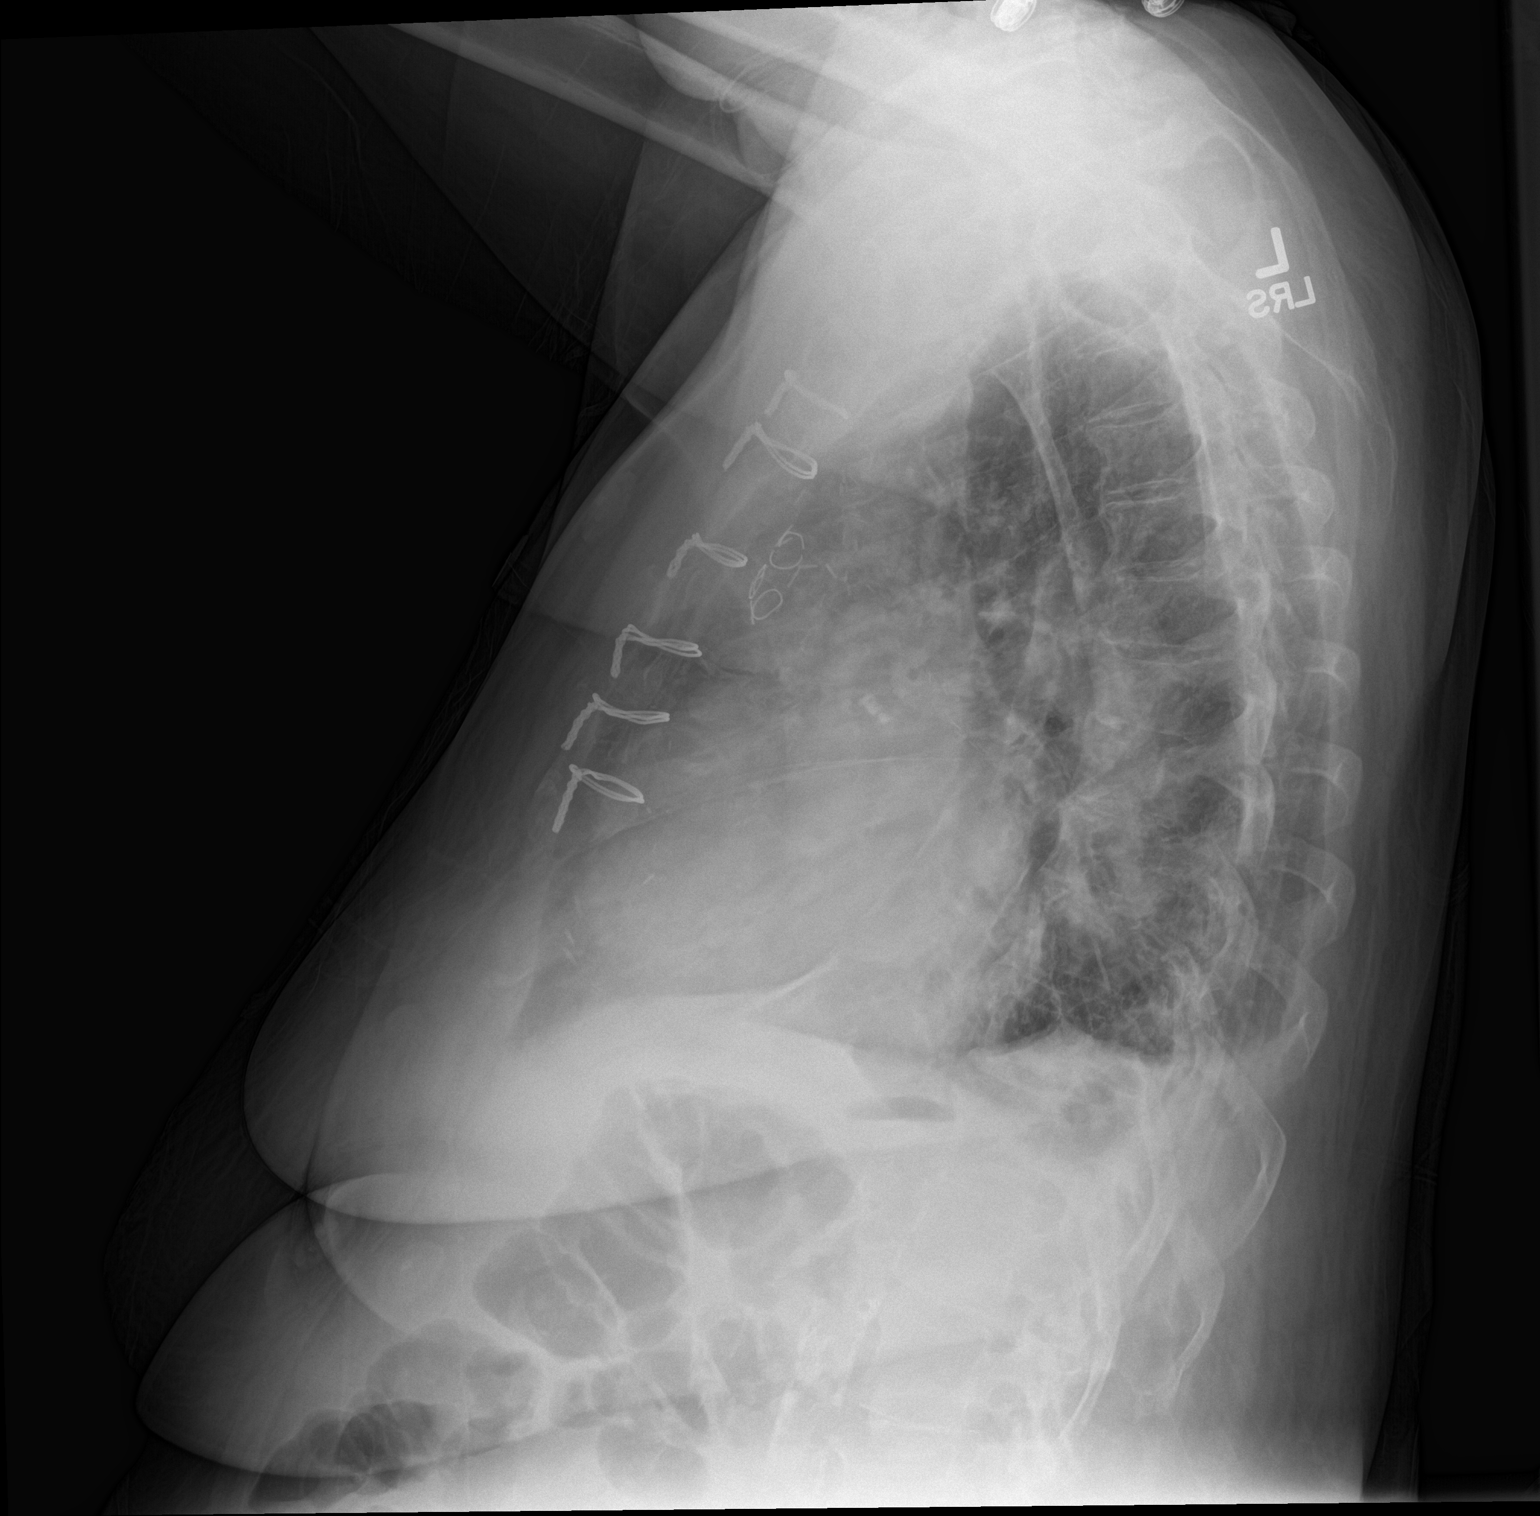

[2 of 2 positions shown; findings below may reference images not displayed]

FINDINGS: The lungs are well-aerated. Vascular congestion is noted. Small
bilateral pleural effusions are seen. Increased interstitial
markings raise concern for mild pulmonary edema. No pneumothorax is
seen.

The heart is mildly enlarged. The patient is status post median
sternotomy. No acute osseous abnormalities are seen.
IMPRESSION: Vascular congestion and mild cardiomegaly. Small bilateral pleural
effusions noted. Increased interstitial markings raise concern for
mild pulmonary edema.

## 2017-07-14 IMAGING — CR DG CHEST 1V PORT
1 series · 1 of 1 positions shown · non-contrast
Comparison: 02/02/2016

CLINICAL DATA: Check endotracheal tube placement

EXAM:
PORTABLE CHEST 1 VIEW

[AP]
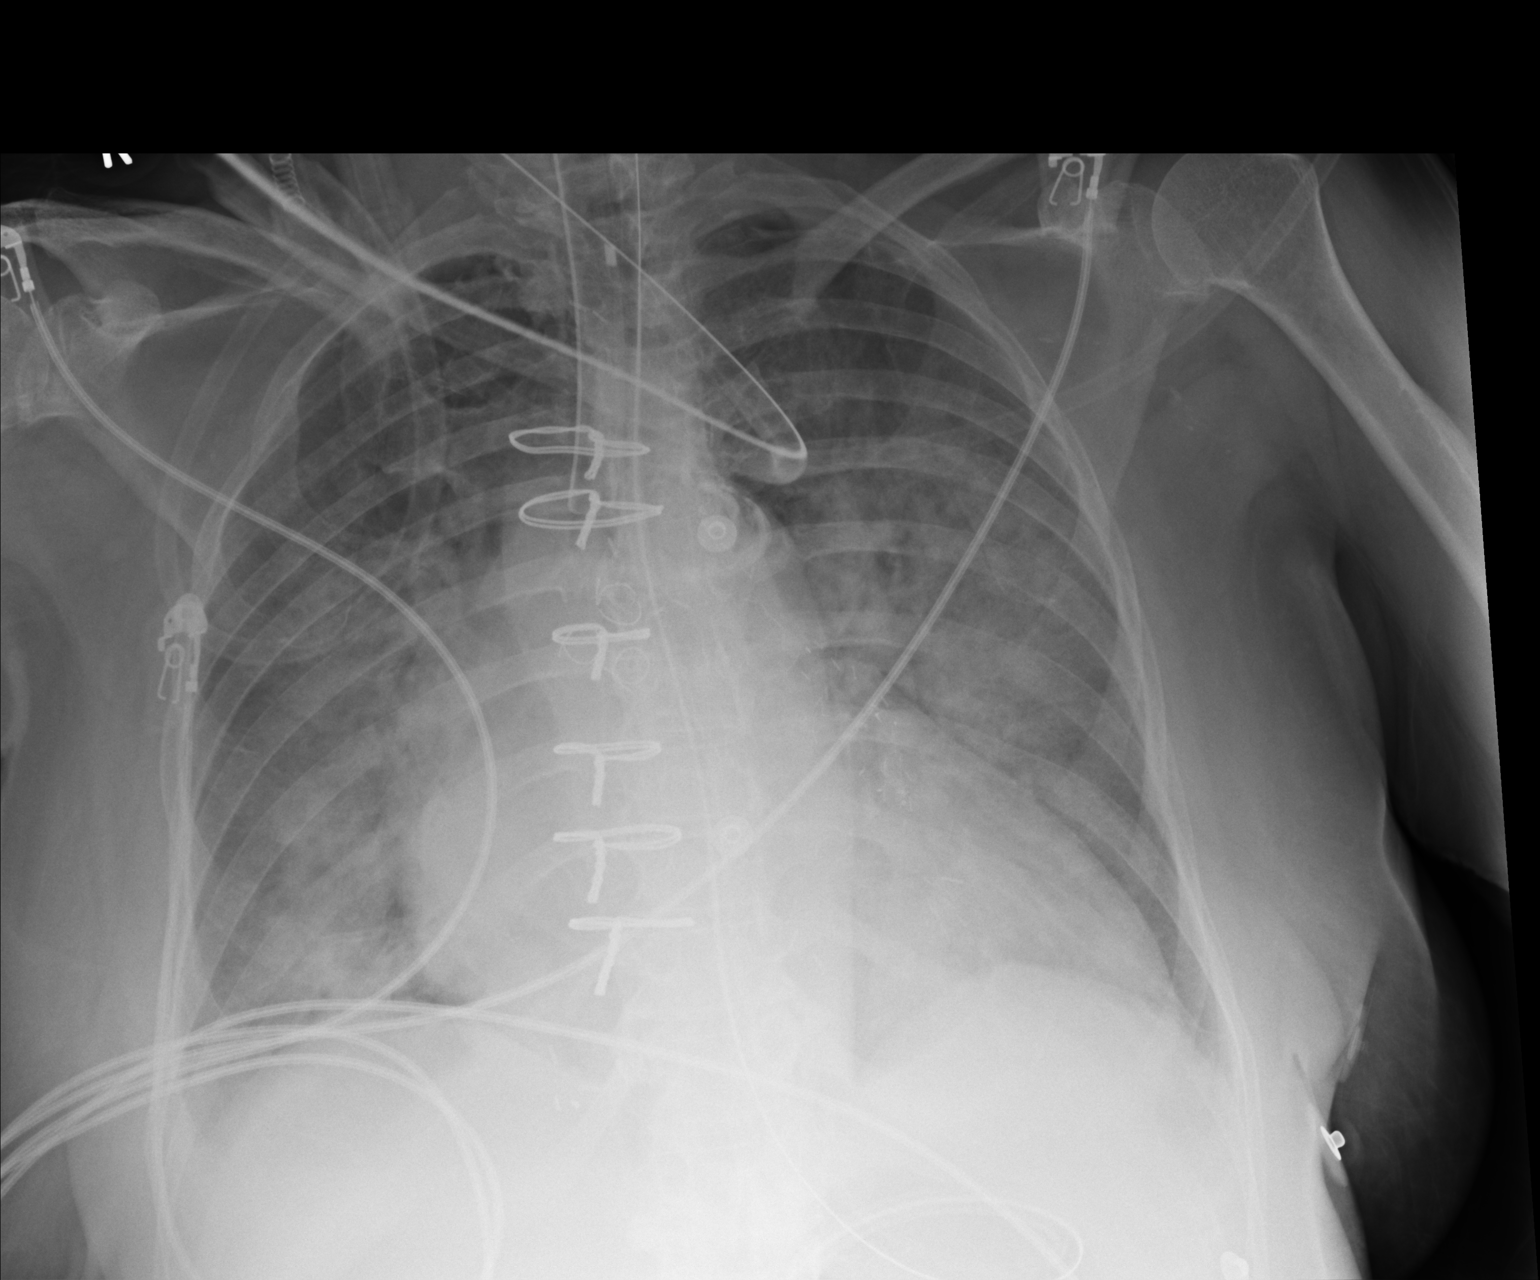

[1 of 1 positions shown; findings below may reference images not displayed]

FINDINGS: Endotracheal tube is been placed in lies approximately 2 cm above
the carina. A nasogastric catheter is coiled within the stomach.
Postsurgical changes are seen. Increasing perihilar densities are
noted consistent with frank pulmonary edema. No sizable effusion is
seen. No bony abnormality is noted.
IMPRESSION: Tubes and lines in satisfactory position.

Increasing pulmonary edema
